# Patient Record
Sex: Female | Born: 1965 | Race: Black or African American | Hispanic: No | Marital: Single | Smoking: Never smoker
Health system: Southern US, Community
[De-identification: ages and names within clinical notes are randomized; demographics above are authoritative.]

## PROBLEM LIST (undated history)

## (undated) DIAGNOSIS — I10 Essential (primary) hypertension: Secondary | ICD-10-CM

## (undated) DIAGNOSIS — G43909 Migraine, unspecified, not intractable, without status migrainosus: Secondary | ICD-10-CM

## (undated) DIAGNOSIS — E119 Type 2 diabetes mellitus without complications: Secondary | ICD-10-CM

## (undated) DIAGNOSIS — E7800 Pure hypercholesterolemia, unspecified: Secondary | ICD-10-CM

## (undated) HISTORY — PX: WRIST SURGERY: SHX841

## (undated) HISTORY — PX: ABDOMINAL HYSTERECTOMY: SHX81

## (undated) HISTORY — PX: ANKLE SURGERY: SHX546

## (undated) HISTORY — PX: KNEE SURGERY: SHX244

---

## 2004-07-30 ENCOUNTER — Emergency Department: Payer: Self-pay | Admitting: Emergency Medicine

## 2004-09-18 ENCOUNTER — Emergency Department: Payer: Self-pay | Admitting: General Practice

## 2004-11-02 ENCOUNTER — Ambulatory Visit: Payer: Self-pay | Admitting: Unknown Physician Specialty

## 2004-11-17 ENCOUNTER — Other Ambulatory Visit: Payer: Self-pay

## 2004-11-17 ENCOUNTER — Emergency Department: Payer: Self-pay | Admitting: Unknown Physician Specialty

## 2004-12-14 ENCOUNTER — Ambulatory Visit: Payer: Self-pay | Admitting: Family Medicine

## 2005-01-08 ENCOUNTER — Emergency Department: Payer: Self-pay | Admitting: Emergency Medicine

## 2005-01-10 ENCOUNTER — Emergency Department: Payer: Self-pay | Admitting: Emergency Medicine

## 2005-03-10 ENCOUNTER — Emergency Department: Payer: Self-pay | Admitting: Emergency Medicine

## 2005-06-21 ENCOUNTER — Emergency Department: Payer: Self-pay | Admitting: Emergency Medicine

## 2005-09-18 ENCOUNTER — Emergency Department: Payer: Self-pay | Admitting: General Practice

## 2006-03-07 ENCOUNTER — Emergency Department: Payer: Self-pay | Admitting: Emergency Medicine

## 2006-09-22 ENCOUNTER — Emergency Department: Payer: Self-pay | Admitting: Emergency Medicine

## 2006-09-24 ENCOUNTER — Emergency Department: Payer: Self-pay | Admitting: Emergency Medicine

## 2006-09-25 ENCOUNTER — Emergency Department: Payer: Self-pay | Admitting: Emergency Medicine

## 2007-01-01 ENCOUNTER — Emergency Department: Payer: Self-pay | Admitting: Emergency Medicine

## 2007-01-14 ENCOUNTER — Ambulatory Visit: Payer: Self-pay | Admitting: Family Medicine

## 2007-02-01 ENCOUNTER — Emergency Department: Payer: Self-pay | Admitting: Emergency Medicine

## 2007-04-11 ENCOUNTER — Other Ambulatory Visit: Payer: Self-pay

## 2007-04-11 ENCOUNTER — Ambulatory Visit: Payer: Self-pay | Admitting: Unknown Physician Specialty

## 2007-04-18 ENCOUNTER — Ambulatory Visit: Payer: Self-pay | Admitting: Unknown Physician Specialty

## 2007-04-19 ENCOUNTER — Emergency Department: Payer: Self-pay | Admitting: Emergency Medicine

## 2007-04-19 ENCOUNTER — Other Ambulatory Visit: Payer: Self-pay

## 2007-04-22 ENCOUNTER — Emergency Department: Payer: Self-pay | Admitting: Emergency Medicine

## 2007-05-01 ENCOUNTER — Emergency Department: Payer: Self-pay | Admitting: Emergency Medicine

## 2007-05-19 ENCOUNTER — Emergency Department: Payer: Self-pay | Admitting: Emergency Medicine

## 2007-05-20 ENCOUNTER — Emergency Department: Payer: Self-pay | Admitting: Emergency Medicine

## 2007-05-23 ENCOUNTER — Emergency Department: Payer: Self-pay | Admitting: Emergency Medicine

## 2007-05-26 ENCOUNTER — Emergency Department: Payer: Self-pay | Admitting: Emergency Medicine

## 2007-06-14 ENCOUNTER — Emergency Department: Payer: Self-pay | Admitting: Emergency Medicine

## 2007-06-14 ENCOUNTER — Other Ambulatory Visit: Payer: Self-pay

## 2007-07-18 ENCOUNTER — Emergency Department: Payer: Self-pay | Admitting: Emergency Medicine

## 2007-07-30 ENCOUNTER — Emergency Department: Payer: Self-pay | Admitting: Emergency Medicine

## 2007-08-15 ENCOUNTER — Emergency Department: Payer: Self-pay | Admitting: Emergency Medicine

## 2007-12-02 ENCOUNTER — Emergency Department: Payer: Self-pay | Admitting: Emergency Medicine

## 2007-12-03 ENCOUNTER — Emergency Department (HOSPITAL_COMMUNITY): Admission: EM | Admit: 2007-12-03 | Discharge: 2007-12-03 | Payer: Self-pay | Admitting: Emergency Medicine

## 2008-01-05 ENCOUNTER — Emergency Department (HOSPITAL_COMMUNITY): Admission: EM | Admit: 2008-01-05 | Discharge: 2008-01-05 | Payer: Self-pay | Admitting: Emergency Medicine

## 2008-01-06 ENCOUNTER — Emergency Department: Payer: Self-pay | Admitting: Emergency Medicine

## 2008-01-06 ENCOUNTER — Emergency Department (HOSPITAL_COMMUNITY): Admission: EM | Admit: 2008-01-06 | Discharge: 2008-01-06 | Payer: Self-pay | Admitting: Emergency Medicine

## 2008-01-20 ENCOUNTER — Ambulatory Visit: Payer: Self-pay | Admitting: Family Medicine

## 2008-01-21 ENCOUNTER — Emergency Department: Payer: Self-pay | Admitting: Emergency Medicine

## 2008-03-02 ENCOUNTER — Emergency Department: Payer: Self-pay | Admitting: Emergency Medicine

## 2008-03-04 ENCOUNTER — Emergency Department: Payer: Self-pay | Admitting: Emergency Medicine

## 2008-04-13 ENCOUNTER — Ambulatory Visit: Payer: Self-pay | Admitting: Neurology

## 2008-05-07 ENCOUNTER — Ambulatory Visit: Payer: Self-pay | Admitting: Neurology

## 2008-05-08 ENCOUNTER — Emergency Department: Payer: Self-pay | Admitting: Internal Medicine

## 2008-05-26 ENCOUNTER — Emergency Department: Payer: Self-pay | Admitting: Emergency Medicine

## 2008-06-23 ENCOUNTER — Emergency Department: Payer: Self-pay | Admitting: Emergency Medicine

## 2008-07-30 ENCOUNTER — Ambulatory Visit: Payer: Self-pay | Admitting: Neurology

## 2008-12-27 ENCOUNTER — Emergency Department: Payer: Self-pay | Admitting: Unknown Physician Specialty

## 2009-01-09 ENCOUNTER — Emergency Department: Payer: Self-pay | Admitting: Emergency Medicine

## 2009-01-10 ENCOUNTER — Emergency Department: Payer: Self-pay | Admitting: Emergency Medicine

## 2009-01-20 ENCOUNTER — Ambulatory Visit: Payer: Self-pay | Admitting: Family Medicine

## 2009-03-10 ENCOUNTER — Emergency Department: Payer: Self-pay | Admitting: Emergency Medicine

## 2009-03-24 ENCOUNTER — Ambulatory Visit: Payer: Self-pay | Admitting: Obstetrics and Gynecology

## 2009-04-04 ENCOUNTER — Inpatient Hospital Stay: Payer: Self-pay | Admitting: Obstetrics and Gynecology

## 2009-04-10 ENCOUNTER — Emergency Department: Payer: Self-pay | Admitting: Emergency Medicine

## 2009-04-14 ENCOUNTER — Emergency Department: Payer: Self-pay | Admitting: Unknown Physician Specialty

## 2009-07-01 ENCOUNTER — Emergency Department: Payer: Self-pay | Admitting: Emergency Medicine

## 2009-08-18 ENCOUNTER — Emergency Department: Payer: Self-pay | Admitting: Unknown Physician Specialty

## 2010-01-05 ENCOUNTER — Emergency Department: Payer: Self-pay | Admitting: Unknown Physician Specialty

## 2010-01-24 ENCOUNTER — Ambulatory Visit: Payer: Self-pay | Admitting: Family Medicine

## 2010-03-09 ENCOUNTER — Emergency Department: Payer: Self-pay | Admitting: Emergency Medicine

## 2010-03-17 ENCOUNTER — Emergency Department: Payer: Self-pay | Admitting: Emergency Medicine

## 2010-05-21 ENCOUNTER — Emergency Department: Payer: Self-pay | Admitting: Emergency Medicine

## 2010-06-18 ENCOUNTER — Emergency Department: Payer: Self-pay | Admitting: Emergency Medicine

## 2010-06-23 ENCOUNTER — Ambulatory Visit: Payer: Self-pay | Admitting: Unknown Physician Specialty

## 2010-12-04 LAB — GLUCOSE, CAPILLARY
Glucose-Capillary: 110 — ABNORMAL HIGH
Glucose-Capillary: 74

## 2010-12-05 LAB — POCT I-STAT, CHEM 8
BUN: 9
HCT: 39
Sodium: 139
TCO2: 26

## 2010-12-05 LAB — POCT PREGNANCY, URINE: Preg Test, Ur: NEGATIVE

## 2010-12-05 LAB — GLUCOSE, CAPILLARY: Glucose-Capillary: 232 — ABNORMAL HIGH

## 2011-01-30 ENCOUNTER — Ambulatory Visit: Payer: Self-pay

## 2011-05-08 ENCOUNTER — Emergency Department: Payer: Self-pay | Admitting: Emergency Medicine

## 2011-08-15 ENCOUNTER — Emergency Department: Payer: Self-pay | Admitting: Unknown Physician Specialty

## 2011-08-16 LAB — URINALYSIS, COMPLETE
Bilirubin,UR: NEGATIVE
Ketone: NEGATIVE
Nitrite: NEGATIVE
Ph: 5 (ref 4.5–8.0)
RBC,UR: <1 /HPF (ref 0–5)
Specific Gravity: 1.017 (ref 1.003–1.030)

## 2011-08-17 ENCOUNTER — Emergency Department: Payer: Self-pay | Admitting: Emergency Medicine

## 2011-08-17 LAB — COMPREHENSIVE METABOLIC PANEL
Anion Gap: 9 (ref 7–16)
BUN: 16 mg/dL (ref 7–18)
Creatinine: 0.93 mg/dL (ref 0.60–1.30)
EGFR (African American): >60
Glucose: 164 mg/dL — ABNORMAL HIGH (ref 65–99)
Osmolality: 292 (ref 275–301)
Potassium: 3.6 mmol/L (ref 3.5–5.1)
SGOT(AST): 10 U/L — ABNORMAL LOW (ref 15–37)
SGPT (ALT): 19 U/L
Sodium: 144 mmol/L (ref 136–145)
Total Protein: 6.3 g/dL — ABNORMAL LOW (ref 6.4–8.2)

## 2011-08-17 LAB — URINALYSIS, COMPLETE
Bacteria: NONE SEEN
Blood: NEGATIVE
Ph: 7 (ref 4.5–8.0)
Protein: NEGATIVE
Squamous Epithelial: 54
WBC UR: NONE SEEN /HPF (ref 0–5)

## 2011-08-17 LAB — CBC
HCT: 41.2 % (ref 35.0–47.0)
HGB: 13 g/dL (ref 12.0–16.0)

## 2011-09-03 ENCOUNTER — Ambulatory Visit: Payer: Self-pay | Admitting: Unknown Physician Specialty

## 2011-11-13 ENCOUNTER — Emergency Department: Payer: Self-pay | Admitting: Emergency Medicine

## 2011-11-13 LAB — URINALYSIS, COMPLETE
Glucose,UR: 150 mg/dL (ref 0–75)
Leukocyte Esterase: NEGATIVE
Nitrite: NEGATIVE
RBC,UR: 2 /HPF (ref 0–5)
Specific Gravity: 1.026 (ref 1.003–1.030)
WBC UR: 2 /HPF (ref 0–5)

## 2011-11-13 LAB — COMPREHENSIVE METABOLIC PANEL
Alkaline Phosphatase: 88 U/L (ref 50–136)
Bilirubin,Total: 0.3 mg/dL (ref 0.2–1.0)
Chloride: 110 mmol/L — ABNORMAL HIGH (ref 98–107)
EGFR (African American): >60
EGFR (Non-African Amer.): >60
Glucose: 201 mg/dL — ABNORMAL HIGH (ref 65–99)
Osmolality: 289 (ref 275–301)
Potassium: 3.9 mmol/L (ref 3.5–5.1)
SGPT (ALT): 24 U/L (ref 12–78)
Sodium: 142 mmol/L (ref 136–145)

## 2011-11-13 LAB — PRO B NATRIURETIC PEPTIDE: B-Type Natriuretic Peptide: 178 pg/mL — ABNORMAL HIGH (ref 0–125)

## 2011-11-13 LAB — CBC
HCT: 40 % (ref 35.0–47.0)
HGB: 13.3 g/dL (ref 12.0–16.0)
MCV: 81 fL (ref 80–100)
RBC: 4.94 10*6/uL (ref 3.80–5.20)
RDW: 14.2 % (ref 11.5–14.5)
WBC: 7.1 10*3/uL (ref 3.6–11.0)

## 2011-12-18 ENCOUNTER — Emergency Department: Payer: Self-pay | Admitting: Unknown Physician Specialty

## 2012-02-07 ENCOUNTER — Ambulatory Visit: Payer: Self-pay

## 2012-05-10 ENCOUNTER — Emergency Department: Payer: Self-pay | Admitting: Emergency Medicine

## 2012-06-16 ENCOUNTER — Emergency Department: Payer: Self-pay | Admitting: Emergency Medicine

## 2012-06-16 LAB — CBC
HCT: 41.2 % (ref 35.0–47.0)
HGB: 13.5 g/dL (ref 12.0–16.0)
MCHC: 32.7 g/dL (ref 32.0–36.0)
MCV: 81 fL (ref 80–100)
Platelet: 237 10*3/uL (ref 150–440)
RBC: 5.1 10*6/uL (ref 3.80–5.20)
RDW: 13.8 % (ref 11.5–14.5)
WBC: 11.1 10*3/uL — ABNORMAL HIGH (ref 3.6–11.0)

## 2012-06-16 LAB — COMPREHENSIVE METABOLIC PANEL
Albumin: 3.6 g/dL (ref 3.4–5.0)
Alkaline Phosphatase: 97 U/L (ref 50–136)
BUN: 14 mg/dL (ref 7–18)
Bilirubin,Total: 0.6 mg/dL (ref 0.2–1.0)
Calcium, Total: 8.6 mg/dL (ref 8.5–10.1)
Co2: 24 mmol/L (ref 21–32)
Creatinine: 0.88 mg/dL (ref 0.60–1.30)
Glucose: 180 mg/dL — ABNORMAL HIGH (ref 65–99)
Osmolality: 281 (ref 275–301)
SGOT(AST): 17 U/L (ref 15–37)
Sodium: 138 mmol/L (ref 136–145)

## 2012-06-16 LAB — CK TOTAL AND CKMB (NOT AT ARMC)
CK, Total: 107 U/L (ref 21–215)
CK-MB: <0.5 ng/mL — ABNORMAL LOW (ref 0.5–3.6)

## 2012-09-21 ENCOUNTER — Emergency Department: Payer: Self-pay | Admitting: Emergency Medicine

## 2012-10-20 ENCOUNTER — Emergency Department: Payer: Self-pay | Admitting: Emergency Medicine

## 2012-10-20 LAB — URINALYSIS, COMPLETE
Bilirubin,UR: NEGATIVE
Glucose,UR: NEGATIVE mg/dL (ref 0–75)
Leukocyte Esterase: NEGATIVE
Nitrite: NEGATIVE
RBC,UR: 1 /HPF (ref 0–5)
Squamous Epithelial: 12
WBC UR: 2 /HPF (ref 0–5)

## 2012-10-20 LAB — CBC
HGB: 13.8 g/dL (ref 12.0–16.0)
RDW: 14 % (ref 11.5–14.5)
WBC: 8.5 10*3/uL (ref 3.6–11.0)

## 2012-10-20 LAB — BASIC METABOLIC PANEL
Calcium, Total: 8.5 mg/dL (ref 8.5–10.1)
Co2: 25 mmol/L (ref 21–32)
Creatinine: 0.99 mg/dL (ref 0.60–1.30)
Glucose: 128 mg/dL — ABNORMAL HIGH (ref 65–99)
Potassium: 3.8 mmol/L (ref 3.5–5.1)
Sodium: 138 mmol/L (ref 136–145)

## 2012-12-03 ENCOUNTER — Emergency Department: Payer: Self-pay | Admitting: Emergency Medicine

## 2013-02-10 ENCOUNTER — Ambulatory Visit: Payer: Self-pay | Admitting: *Deleted

## 2013-03-10 ENCOUNTER — Ambulatory Visit: Payer: Self-pay

## 2013-03-24 ENCOUNTER — Emergency Department: Payer: Self-pay | Admitting: Emergency Medicine

## 2013-03-26 ENCOUNTER — Emergency Department: Payer: Self-pay | Admitting: Emergency Medicine

## 2013-09-07 ENCOUNTER — Emergency Department: Payer: Self-pay | Admitting: Emergency Medicine

## 2013-09-07 LAB — COMPREHENSIVE METABOLIC PANEL
ALT: 23 U/L (ref 12–78)
Albumin: 3.1 g/dL — ABNORMAL LOW (ref 3.4–5.0)
Alkaline Phosphatase: 78 U/L
Anion Gap: 5 — ABNORMAL LOW (ref 7–16)
BILIRUBIN TOTAL: 0.3 mg/dL (ref 0.2–1.0)
BUN: 13 mg/dL (ref 7–18)
CHLORIDE: 109 mmol/L — AB (ref 98–107)
CO2: 26 mmol/L (ref 21–32)
CREATININE: 1.15 mg/dL (ref 0.60–1.30)
Calcium, Total: 8.1 mg/dL — ABNORMAL LOW (ref 8.5–10.1)
EGFR (African American): >60
EGFR (Non-African Amer.): 57 — ABNORMAL LOW
Glucose: 154 mg/dL — ABNORMAL HIGH (ref 65–99)
Osmolality: 283 (ref 275–301)
POTASSIUM: 3.8 mmol/L (ref 3.5–5.1)
SGOT(AST): 17 U/L (ref 15–37)
Sodium: 140 mmol/L (ref 136–145)
TOTAL PROTEIN: 6.6 g/dL (ref 6.4–8.2)

## 2013-09-07 LAB — CBC
HCT: 40.1 % (ref 35.0–47.0)
HGB: 13.3 g/dL (ref 12.0–16.0)
MCH: 27.3 pg (ref 26.0–34.0)
MCHC: 33.1 g/dL (ref 32.0–36.0)
MCV: 82 fL (ref 80–100)
Platelet: 257 10*3/uL (ref 150–440)
RBC: 4.87 10*6/uL (ref 3.80–5.20)
RDW: 14 % (ref 11.5–14.5)
WBC: 8.5 10*3/uL (ref 3.6–11.0)

## 2013-09-07 LAB — TROPONIN I: Troponin-I: <0.02 ng/mL

## 2013-09-07 LAB — CK TOTAL AND CKMB (NOT AT ARMC)
CK, Total: 78 U/L
CK-MB: 0.5 ng/mL (ref 0.5–3.6)

## 2013-09-07 LAB — LIPASE, BLOOD: Lipase: 160 U/L (ref 73–393)

## 2013-09-10 ENCOUNTER — Emergency Department: Payer: Self-pay | Admitting: Emergency Medicine

## 2013-11-13 ENCOUNTER — Emergency Department: Payer: Self-pay | Admitting: Emergency Medicine

## 2013-11-13 LAB — BASIC METABOLIC PANEL
ANION GAP: 5 — AB (ref 7–16)
BUN: 19 mg/dL — AB (ref 7–18)
CHLORIDE: 107 mmol/L (ref 98–107)
CREATININE: 1.12 mg/dL (ref 0.60–1.30)
Calcium, Total: 8.6 mg/dL (ref 8.5–10.1)
Co2: 29 mmol/L (ref 21–32)
GFR CALC NON AF AMER: 59 — AB
GLUCOSE: 145 mg/dL — AB (ref 65–99)
OSMOLALITY: 286 (ref 275–301)
POTASSIUM: 3.9 mmol/L (ref 3.5–5.1)
Sodium: 141 mmol/L (ref 136–145)

## 2013-11-13 LAB — CBC
HCT: 44.1 % (ref 35.0–47.0)
HGB: 13.9 g/dL (ref 12.0–16.0)
MCH: 26 pg (ref 26.0–34.0)
MCHC: 31.5 g/dL — AB (ref 32.0–36.0)
MCV: 83 fL (ref 80–100)
PLATELETS: 263 10*3/uL (ref 150–440)
RBC: 5.33 10*6/uL — AB (ref 3.80–5.20)
RDW: 13.9 % (ref 11.5–14.5)
WBC: 9.1 10*3/uL (ref 3.6–11.0)

## 2013-11-13 LAB — URINALYSIS, COMPLETE
BILIRUBIN, UR: NEGATIVE
Bacteria: NONE SEEN
Blood: NEGATIVE
GLUCOSE, UR: NEGATIVE mg/dL (ref 0–75)
KETONE: NEGATIVE
Leukocyte Esterase: NEGATIVE
Nitrite: NEGATIVE
Ph: 6 (ref 4.5–8.0)
Protein: NEGATIVE
RBC,UR: 1 /HPF (ref 0–5)
SPECIFIC GRAVITY: 1.025 (ref 1.003–1.030)
WBC UR: 2 /HPF (ref 0–5)

## 2013-11-25 ENCOUNTER — Emergency Department: Payer: Self-pay | Admitting: Emergency Medicine

## 2013-12-30 ENCOUNTER — Emergency Department: Payer: Self-pay | Admitting: Emergency Medicine

## 2013-12-30 LAB — CBC
HCT: 43.7 % (ref 35.0–47.0)
HGB: 14.2 g/dL (ref 12.0–16.0)
MCH: 27 pg (ref 26.0–34.0)
MCHC: 32.5 g/dL (ref 32.0–36.0)
MCV: 83 fL (ref 80–100)
PLATELETS: 286 10*3/uL (ref 150–440)
RBC: 5.26 10*6/uL — ABNORMAL HIGH (ref 3.80–5.20)
RDW: 13.3 % (ref 11.5–14.5)
WBC: 8.4 10*3/uL (ref 3.6–11.0)

## 2013-12-31 LAB — TROPONIN I: Troponin-I: <0.02 ng/mL

## 2013-12-31 LAB — BASIC METABOLIC PANEL
ANION GAP: 11 (ref 7–16)
BUN: 17 mg/dL (ref 7–18)
CREATININE: 1.53 mg/dL — AB (ref 0.60–1.30)
Calcium, Total: 8.6 mg/dL (ref 8.5–10.1)
Chloride: 105 mmol/L (ref 98–107)
Co2: 25 mmol/L (ref 21–32)
EGFR (Non-African Amer.): 39 — ABNORMAL LOW
GFR CALC AF AMER: 47 — AB
GLUCOSE: 95 mg/dL (ref 65–99)
Osmolality: 283 (ref 275–301)
Potassium: 3.3 mmol/L — ABNORMAL LOW (ref 3.5–5.1)
Sodium: 141 mmol/L (ref 136–145)

## 2014-02-11 ENCOUNTER — Ambulatory Visit: Payer: Self-pay | Admitting: Family

## 2014-02-23 ENCOUNTER — Emergency Department: Payer: Self-pay | Admitting: Emergency Medicine

## 2014-02-23 LAB — CBC
HCT: 42.6 % (ref 35.0–47.0)
HGB: 13.8 g/dL (ref 12.0–16.0)
MCH: 27 pg (ref 26.0–34.0)
MCHC: 32.4 g/dL (ref 32.0–36.0)
MCV: 83 fL (ref 80–100)
Platelet: 277 10*3/uL (ref 150–440)
RBC: 5.11 10*6/uL (ref 3.80–5.20)
RDW: 13.7 % (ref 11.5–14.5)
WBC: 7.7 10*3/uL (ref 3.6–11.0)

## 2014-02-23 LAB — COMPREHENSIVE METABOLIC PANEL
ALBUMIN: 3.3 g/dL — AB (ref 3.4–5.0)
ANION GAP: 6 — AB (ref 7–16)
AST: 20 U/L (ref 15–37)
Alkaline Phosphatase: 93 U/L
BUN: 14 mg/dL (ref 7–18)
Bilirubin,Total: 0.3 mg/dL (ref 0.2–1.0)
CHLORIDE: 109 mmol/L — AB (ref 98–107)
CO2: 25 mmol/L (ref 21–32)
Calcium, Total: 8.7 mg/dL (ref 8.5–10.1)
Creatinine: 1.34 mg/dL — ABNORMAL HIGH (ref 0.60–1.30)
EGFR (African American): 55 — ABNORMAL LOW
GFR CALC NON AF AMER: 45 — AB
Glucose: 118 mg/dL — ABNORMAL HIGH (ref 65–99)
OSMOLALITY: 281 (ref 275–301)
Potassium: 4.1 mmol/L (ref 3.5–5.1)
SGPT (ALT): 32 U/L
Sodium: 140 mmol/L (ref 136–145)
TOTAL PROTEIN: 6.9 g/dL (ref 6.4–8.2)

## 2014-03-14 ENCOUNTER — Emergency Department: Payer: Self-pay | Admitting: Student

## 2014-03-14 LAB — BASIC METABOLIC PANEL
Anion Gap: 9 (ref 7–16)
BUN: 13 mg/dL (ref 7–18)
CALCIUM: 8.5 mg/dL (ref 8.5–10.1)
CO2: 27 mmol/L (ref 21–32)
Chloride: 105 mmol/L (ref 98–107)
Creatinine: 1.15 mg/dL (ref 0.60–1.30)
EGFR (African American): >60
EGFR (Non-African Amer.): 54 — ABNORMAL LOW
Glucose: 134 mg/dL — ABNORMAL HIGH (ref 65–99)
Osmolality: 283 (ref 275–301)
POTASSIUM: 3.5 mmol/L (ref 3.5–5.1)
SODIUM: 141 mmol/L (ref 136–145)

## 2014-03-14 LAB — CBC
HCT: 42.2 % (ref 35.0–47.0)
HGB: 13.5 g/dL (ref 12.0–16.0)
MCH: 26.3 pg (ref 26.0–34.0)
MCHC: 32.1 g/dL (ref 32.0–36.0)
MCV: 82 fL (ref 80–100)
PLATELETS: 285 10*3/uL (ref 150–440)
RBC: 5.14 10*6/uL (ref 3.80–5.20)
RDW: 13.7 % (ref 11.5–14.5)
WBC: 8.6 10*3/uL (ref 3.6–11.0)

## 2014-03-14 LAB — TROPONIN I

## 2014-03-14 LAB — HCG, QUANTITATIVE, PREGNANCY

## 2014-03-19 ENCOUNTER — Emergency Department: Payer: Self-pay | Admitting: Emergency Medicine

## 2014-04-07 ENCOUNTER — Emergency Department: Payer: Self-pay | Admitting: Emergency Medicine

## 2014-04-07 LAB — COMPREHENSIVE METABOLIC PANEL
ALBUMIN: 3.3 g/dL — AB (ref 3.4–5.0)
ALK PHOS: 74 U/L (ref 46–116)
ALT: 23 U/L (ref 14–63)
AST: 13 U/L — AB (ref 15–37)
Anion Gap: 8 (ref 7–16)
BUN: 17 mg/dL (ref 7–18)
Bilirubin,Total: 0.4 mg/dL (ref 0.2–1.0)
CREATININE: 1.19 mg/dL (ref 0.60–1.30)
Calcium, Total: 8.8 mg/dL (ref 8.5–10.1)
Chloride: 106 mmol/L (ref 98–107)
Co2: 27 mmol/L (ref 21–32)
EGFR (African American): >60
EGFR (Non-African Amer.): 52 — ABNORMAL LOW
Glucose: 147 mg/dL — ABNORMAL HIGH (ref 65–99)
Osmolality: 285 (ref 275–301)
Potassium: 3.4 mmol/L — ABNORMAL LOW (ref 3.5–5.1)
Sodium: 141 mmol/L (ref 136–145)
TOTAL PROTEIN: 6.5 g/dL (ref 6.4–8.2)

## 2014-04-07 LAB — CBC
HCT: 40.9 % (ref 35.0–47.0)
HGB: 13.3 g/dL (ref 12.0–16.0)
MCH: 26.4 pg (ref 26.0–34.0)
MCHC: 32.5 g/dL (ref 32.0–36.0)
MCV: 81 fL (ref 80–100)
PLATELETS: 245 10*3/uL (ref 150–440)
RBC: 5.03 10*6/uL (ref 3.80–5.20)
RDW: 13.4 % (ref 11.5–14.5)
WBC: 6.9 10*3/uL (ref 3.6–11.0)

## 2014-04-07 LAB — TROPONIN I: Troponin-I: <0.02 ng/mL

## 2014-04-28 DIAGNOSIS — G43909 Migraine, unspecified, not intractable, without status migrainosus: Secondary | ICD-10-CM | POA: Insufficient documentation

## 2014-04-28 DIAGNOSIS — E119 Type 2 diabetes mellitus without complications: Secondary | ICD-10-CM | POA: Insufficient documentation

## 2014-06-27 NOTE — Op Note (Signed)
PATIENT NAME:  Michele [name], Michele [name] MR#:  [id number] DATE OF BIRTH:  11-May-1965  DATE OF PROCEDURE:  09/03/2011  PREOPERATIVE DIAGNOSIS: Remote ORIF lateral malleolar fracture right ankle.   POSTOPERATIVE DIAGNOSIS: Remote ORIF lateral malleolar fracture right ankle.   PROCEDURE: Removal of lateral malleolar hardware.   SURGEON: Alda BertholdHarold B. Deshun Sedivy Jr., MD  ANESTHESIA: General.   HISTORY: The patient had [name] remote displaced fracture of right lateral malleolus along with disruption of her distal tib-fib syndesmosis. She had ORIF of the lateral malleolar fracture and fixation of the ruptured syndesmosis. Patient had healed her fracture and the joint mortise had been essentially anatomic. Her current problem was pain secondary to her prominent fixation screws. She was brought in for removal of her fixation hardware.   DESCRIPTION OF PROCEDURE: Patient taken the Operating Room where satisfactory general anesthesia was achieved. [name] tourniquet was applied to her right upper thigh and then the right lower extremity was prepped and draped in the usual fashion for [name] procedure about the ankle. Next, the right upper extremity was exsanguinated and tourniquet was inflated. The patient's previous lateral malleolar incision was re-incised. I then bluntly and sharply dissected down to the lateral fixation plate. I used [name] knife to remove the scar tissue covering the plate and then I removed the seven cortical screws that were used to stabilize the plate. I also removed the fixation button that was on the lateral aspect of the plate. This was attached to the tib-fib transfixion sutures.   After the plate and screws and transfixion button were removed the tourniquet was released. It was up 15 minutes. There was very minimal bleeding. The wound was irrigated with GU irrigant and then I closed the sub-Q with 2-0 Vicryl and the skin with skin staples. I infiltrated the wound with about 15 mL of 0.5% Marcaine with epinephrine.  Betadine was applied to the wound followed by [name] sterile dressing and an [employer] boot.     Patient was then awakened and transferred to [name] stretcher bed. She was taken to the recovery room in satisfactory condition. Blood loss was negligible.    ____________________________ Alda BertholdHarold B. Ryle Buscemi Jr., MD hbk:cms D: 09/03/2011 14:16:28 ET T: 09/03/2011 14:56:49 ET JOB#: [id number]  cc: Alda BertholdHarold B. Dhara Schepp Jr., MD, <Dictator>  Randon GoldsmithHAROLD B Brynley Cuddeback, Montez HagemanJR MD ELECTRONICALLY SIGNED 09/17/2011 11:41

## 2014-08-19 ENCOUNTER — Encounter: Payer: Self-pay | Admitting: Emergency Medicine

## 2014-08-19 ENCOUNTER — Emergency Department: Payer: No Typology Code available for payment source

## 2014-08-19 ENCOUNTER — Emergency Department
Admission: EM | Admit: 2014-08-19 | Discharge: 2014-08-19 | Disposition: A | Discharge status: Home / Self Care | Payer: No Typology Code available for payment source | Attending: Emergency Medicine | Admitting: Emergency Medicine

## 2014-08-19 DIAGNOSIS — H6502 Acute serous otitis media, left ear: Secondary | ICD-10-CM | POA: Diagnosis not present

## 2014-08-19 DIAGNOSIS — E119 Type 2 diabetes mellitus without complications: Secondary | ICD-10-CM | POA: Insufficient documentation

## 2014-08-19 DIAGNOSIS — J069 Acute upper respiratory infection, unspecified: Secondary | ICD-10-CM | POA: Diagnosis not present

## 2014-08-19 DIAGNOSIS — I10 Essential (primary) hypertension: Secondary | ICD-10-CM | POA: Insufficient documentation

## 2014-08-19 DIAGNOSIS — H938X2 Other specified disorders of left ear: Secondary | ICD-10-CM | POA: Diagnosis not present

## 2014-08-19 DIAGNOSIS — M62830 Muscle spasm of back: Secondary | ICD-10-CM | POA: Insufficient documentation

## 2014-08-19 DIAGNOSIS — M62838 Other muscle spasm: Secondary | ICD-10-CM | POA: Diagnosis not present

## 2014-08-19 DIAGNOSIS — M545 Low back pain: Secondary | ICD-10-CM | POA: Diagnosis present

## 2014-08-19 HISTORY — DX: Type 2 diabetes mellitus without complications: E11.9

## 2014-08-19 HISTORY — DX: Essential (primary) hypertension: I10

## 2014-08-19 HISTORY — DX: Migraine, unspecified, not intractable, without status migrainosus: G43.909

## 2014-08-19 MED ORDER — KETOROLAC TROMETHAMINE 30 MG/ML IJ SOLN
30.0000 mg | Freq: Once | INTRAMUSCULAR | Status: AC
  Administered 2014-08-19: 30 mg via INTRAMUSCULAR

## 2014-08-19 MED ORDER — BENZONATATE 100 MG PO CAPS: 100.0000 mg | ORAL_CAPSULE | Freq: Four times a day (QID) | ORAL | Status: AC | PRN

## 2014-08-19 MED ORDER — KETOROLAC TROMETHAMINE 30 MG/ML IJ SOLN
INTRAMUSCULAR | Status: AC
  Filled 2014-08-19: qty 1

## 2014-08-19 MED ORDER — CYCLOBENZAPRINE HCL 10 MG PO TABS
10.0000 mg | ORAL_TABLET | Freq: Three times a day (TID) | ORAL | Status: DC | PRN
Start: 2014-08-19 — End: 2015-06-14

## 2014-08-19 NOTE — ED Provider Notes (Signed)
CSN: [id number]     Arrival date & time 08/19/14  1447 History   First MD Initiated Contact with Patient 08/19/14 1528     Chief Complaint  Patient presents with  . Back Pain     HPI Comments: 49 year old female presents today complaining of back pain that started when she woke up this morning. She has had some sinus congestion and cough. She is mainly concerned about the pain in her upper back and lower back. The pain radiates from her back into her arms and legs. She reports she has a burning sensation in her arms and legs. She also has a burning sensation when her skin is touched. She has not had an injury to her back. She does not stand all day at work. She does not have a history of back problems per her report. No loss of bowel or bladder function, perianal or genital numbness.  Pt seen at walk in clinic yesterday and diagnosed with left otitis media, is taking amoxicillin for the same. Has also had some sinus congestion and cough, non productive.   Patient is a 49 y.o. female presenting with back pain. The history is provided by the patient.  Back Pain Location:  Thoracic spine and lumbar spine Quality:  Stabbing Radiates to:  L posterior upper leg and R posterior upper leg Pain severity:  Moderate Pain is:  Same all the time Onset quality:  Gradual Duration:  12 hours Timing:  Constant Progression:  Unchanged Chronicity:  New Context: recent illness   Context: not falling, not jumping from heights, not lifting heavy objects, not occupational injury, not physical stress and not recent injury   Relieved by:  None tried Worsened by:  Ambulation, deep breathing, movement, standing, twisting and lying down Ineffective treatments:  None tried Associated symptoms: fever, leg pain and tingling   Associated symptoms: no bladder incontinence, no bowel incontinence, no dysuria, no numbness, no paresthesias, no perianal numbness and no weakness   Risk factors: obesity     Past Medical  History  Diagnosis Date  . Hypertension   . Diabetes mellitus without complication   . Migraines    Past Surgical History  Procedure Laterality Date  . Cesarean section    . Knee surgery    . Ankle surgery     No family history on file. History  Substance Use Topics  . Smoking status: Never Smoker   . Smokeless tobacco: Current User    Types: Snuff  . Alcohol Use: Yes     Comment: occasionally   OB History    No data available     Review of Systems  Constitutional: Positive for fever. Negative for chills.  HENT: Positive for congestion.   Respiratory: Positive for cough.   Gastrointestinal: Negative for bowel incontinence.  Genitourinary: Negative for bladder incontinence and dysuria.  Musculoskeletal: Positive for myalgias, back pain and arthralgias. Negative for neck pain and neck stiffness.  Skin: Negative for rash.  Neurological: Positive for tingling. Negative for weakness, numbness and paresthesias.  All other systems reviewed and are negative.     Allergies  Review of patient's allergies indicates no known allergies.  Home Medications   Prior to Admission medications   Medication Sig Start Date End Date Taking? Authorizing Provider  benzonatate (TESSALON PERLES) 100 MG capsule Take 1 capsule (100 mg total) by mouth every 6 (six) hours as needed for cough. 08/19/14 08/19/15  Luvenia Redden, PA-C  cyclobenzaprine (FLEXERIL) 10 MG tablet Take 1 tablet (  10 mg total) by mouth every 8 (eight) hours as needed for muscle spasms. 08/19/14 08/19/15  Wilber Oliphant V, PA-C   BP 145/97 mmHg  Pulse 103  Temp(Src) 100.8 F (38.2 C) (Oral)  Resp 17  Ht  (1.676 m)  Wt 250 lb (113.399 kg)  BMI 40.37 kg/m2  SpO2 95% Physical Exam  Constitutional: She is oriented to person, place, and time. She appears well-developed and well-nourished. She is active.  Non-toxic appearance. She does not have a sickly appearance. She does not appear ill.  Febrile. Morbidly obese. In mild  distress due to pain  HENT:  Head: Normocephalic and atraumatic.  Right Ear: Tympanic membrane and external ear normal.  Left Ear: External ear normal. Tympanic membrane is erythematous and retracted.  Nose: Mucosal edema and rhinorrhea present.  Mouth/Throat: Uvula is midline, oropharynx is clear and moist and mucous membranes are normal.  Eyes: Conjunctivae and EOM are normal. Pupils are equal, round, and reactive to light.  Neck: Trachea normal, normal range of motion, full passive range of motion without pain and phonation normal. Neck supple. No Brudzinski's sign and no Kernig's sign noted.  Cardiovascular: Normal rate, regular rhythm, normal heart sounds and intact distal pulses.   Pulses:      Right dorsalis pedis pulse not accessible and left dorsalis pedis pulse not accessible.       Right posterior tibial pulse not accessible and left posterior tibial pulse not accessible.  Pulmonary/Chest: Effort normal and breath sounds normal. No respiratory distress. She has no wheezes. She has no rales.  Musculoskeletal: Normal range of motion. She exhibits tenderness.       Right hip: Normal. She exhibits no tenderness.       Left hip: Normal. She exhibits no tenderness.       Thoracic back: She exhibits tenderness, bony tenderness and spasm.       Lumbar back: She exhibits tenderness, bony tenderness and spasm.  Diffuse tenderness over thoracic spine Diffuse tenderness over lumbar spine  Lymphadenopathy:    She has no cervical adenopathy.  Neurological: She is alert and oriented to person, place, and time. She has normal strength. No sensory deficit. She exhibits normal muscle tone. Coordination and gait normal.  Equal grip strength bilateral upper extremities 5/5 Equal LE strength bilaterally 5/5  Skin: Skin is warm and dry. No rash noted.  Psychiatric: She has a normal mood and affect. Her behavior is normal. Judgment and thought content normal.  Nursing note and vitals  reviewed.   ED Course  Procedures (including critical care time) Labs Review Labs Reviewed - No data to display  Imaging Review Dg Thoracic Spine 2 View  08/19/2014   CLINICAL DATA:  One day history of severe dorsalgia  EXAM: THORACIC SPINE - 3 VIEW  COMPARISON:  None.  FINDINGS: Frontal, lateral, and swimmer's views obtained. No fracture or spondylolisthesis. Disc spaces appear intact. No erosive change.  IMPRESSION: No fracture or spondylolisthesis.  No appreciable arthropathy.   Electronically Signed   By: Bretta Bang III M.D.   On: 08/19/2014 16:27   Dg Lumbar Spine 2-3 Views  08/19/2014   CLINICAL DATA:  Severe back pain from shoulders to legs, no injury  EXAM: LUMBAR SPINE - 2-3 VIEW  COMPARISON:  03/24/2013  FINDINGS: Five non-rib-bearing lumbar vertebra.  Osseous mineralization grossly normal for technique.  Vertebral body and disc space heights maintained.  No acute fracture, subluxation or bone destruction.  No spondylolysis.  SI joints symmetric.  IMPRESSION:  Normal exam.   Electronically Signed   By: Ulyses Southward M.D.   On: 08/19/2014 16:27     EKG Interpretation None      I reviewed thoracic and lumbar spine xrays.   MDM  Thoracic and lumbar spine x-ray Fever likely due to viral URI versus) OM. Toradol 30 g IM given in ER Tessalon Perles for cough Alternate Tylenol and Motrin for fever Flexeril as needed for muscle spasms. Final diagnoses:  Muscle spasm of back  Muscle spasms of neck  Viral URI  Acute serous otitis media of left ear, recurrence not specified        Luvenia Redden, PA-C 08/19/14 1643  Darien Ramus, MD 08/19/14 2221

## 2014-08-19 NOTE — ED Notes (Signed)
Pt woke up with back pain today.  States she awakened with pain from shoulders to lower back.   No known injury.   Denies dysuria.   No n/v/d.   Pt has sinus congestion and cough   No fever.

## 2014-08-19 NOTE — ED Notes (Signed)
Pt states she woke up this morning with severe back pain from her shoulders all the way down her legs in the back only. Pt appears in no distress.

## 2014-08-26 ENCOUNTER — Other Ambulatory Visit: Payer: Self-pay

## 2014-08-26 ENCOUNTER — Emergency Department: Payer: PRIVATE HEALTH INSURANCE

## 2014-08-26 ENCOUNTER — Emergency Department
Admission: EM | Admit: 2014-08-26 | Discharge: 2014-08-26 | Disposition: A | Discharge status: Home / Self Care | Payer: PRIVATE HEALTH INSURANCE | Attending: Emergency Medicine | Admitting: Emergency Medicine

## 2014-08-26 ENCOUNTER — Encounter: Payer: Self-pay | Admitting: *Deleted

## 2014-08-26 DIAGNOSIS — Z79899 Other long term (current) drug therapy: Secondary | ICD-10-CM | POA: Diagnosis not present

## 2014-08-26 DIAGNOSIS — J3489 Other specified disorders of nose and nasal sinuses: Secondary | ICD-10-CM

## 2014-08-26 DIAGNOSIS — Z792 Long term (current) use of antibiotics: Secondary | ICD-10-CM | POA: Diagnosis not present

## 2014-08-26 DIAGNOSIS — I10 Essential (primary) hypertension: Secondary | ICD-10-CM | POA: Diagnosis not present

## 2014-08-26 DIAGNOSIS — R091 Pleurisy: Secondary | ICD-10-CM | POA: Insufficient documentation

## 2014-08-26 DIAGNOSIS — R079 Chest pain, unspecified: Secondary | ICD-10-CM | POA: Insufficient documentation

## 2014-08-26 DIAGNOSIS — E119 Type 2 diabetes mellitus without complications: Secondary | ICD-10-CM | POA: Insufficient documentation

## 2014-08-26 LAB — COMPREHENSIVE METABOLIC PANEL
ALK PHOS: 63 U/L (ref 38–126)
ALT: 22 U/L (ref 14–54)
AST: 20 U/L (ref 15–41)
Albumin: 3.6 g/dL (ref 3.5–5.0)
Anion gap: 7 (ref 5–15)
BILIRUBIN TOTAL: 0.2 mg/dL — AB (ref 0.3–1.2)
BUN: 16 mg/dL (ref 6–20)
CO2: 26 mmol/L (ref 22–32)
Calcium: 9.6 mg/dL (ref 8.9–10.3)
Chloride: 107 mmol/L (ref 101–111)
Creatinine, Ser: 1.2 mg/dL — ABNORMAL HIGH (ref 0.44–1.00)
GFR, EST NON AFRICAN AMERICAN: 53 mL/min — AB (ref >60)
GLUCOSE: 165 mg/dL — AB (ref 65–99)
POTASSIUM: 3.7 mmol/L (ref 3.5–5.1)
Sodium: 140 mmol/L (ref 135–145)
Total Protein: 6.9 g/dL (ref 6.5–8.1)

## 2014-08-26 LAB — CBC
HCT: 41.5 % (ref 35.0–47.0)
Hemoglobin: 13.8 g/dL (ref 12.0–16.0)
MCH: 26.7 pg (ref 26.0–34.0)
MCHC: 33.2 g/dL (ref 32.0–36.0)
MCV: 80.5 fL (ref 80.0–100.0)
PLATELETS: 273 10*3/uL (ref 150–440)
RBC: 5.15 MIL/uL (ref 3.80–5.20)
RDW: 13.6 % (ref 11.5–14.5)
WBC: 9 10*3/uL (ref 3.6–11.0)

## 2014-08-26 LAB — TROPONIN I: Troponin I: <0.03 ng/mL (ref <0.031)

## 2014-08-26 LAB — BRAIN NATRIURETIC PEPTIDE: B NATRIURETIC PEPTIDE 5: 8 pg/mL (ref 0.0–100.0)

## 2014-08-26 MED ORDER — ALBUTEROL SULFATE (2.5 MG/3ML) 0.083% IN NEBU
INHALATION_SOLUTION | RESPIRATORY_TRACT | Status: AC
  Administered 2014-08-26: 2.5 mg via RESPIRATORY_TRACT
  Filled 2014-08-26: qty 3

## 2014-08-26 MED ORDER — ALBUTEROL SULFATE (2.5 MG/3ML) 0.083% IN NEBU
2.5000 mg | INHALATION_SOLUTION | Freq: Once | RESPIRATORY_TRACT | Status: AC
  Administered 2014-08-26: 2.5 mg via RESPIRATORY_TRACT

## 2014-08-26 MED ORDER — IPRATROPIUM-ALBUTEROL 0.5-2.5 (3) MG/3ML IN SOLN
RESPIRATORY_TRACT | Status: AC
  Filled 2014-08-26: qty 3

## 2014-08-26 MED ORDER — ALBUTEROL SULFATE HFA 108 (90 BASE) MCG/ACT IN AERS: 2.0000 | INHALATION_SPRAY | Freq: Four times a day (QID) | RESPIRATORY_TRACT | Status: DC | PRN

## 2014-08-26 NOTE — ED Provider Notes (Signed)
[facility] Emergency Department Provider Note  ____________________________________________  Time seen: Approximately 830 PM  I have reviewed the triage vital signs and the nursing notes.   HISTORY  Chief Complaint Shortness of Breath    HPI Michele [name] is a 49 y.o. female with a history of hypertension and diabetes who presents today with 2 weeks of coughing as well as runny nose and ear infection. She is presenting today to the emergency department because she has had chest pain over the last 2 days. She says the chest pain is central and radiating to her back. She says that it is sharp and is worsened with coughing. She says that she has tried Toradol, muscle cream as well as muscle relaxers without relief. She is also had 2 courses of antibiotics without resolution of her symptoms. She has tried Mucinex and Flonase also without relief. Is complaining of pain to her left ear which she describes as pressure which is moderate. Patient says she has had nausea but no vomiting. No known sick contacts. Denies being on any hormone supplements such as birth control.   Past Medical History  Diagnosis Date  . Hypertension   . Diabetes mellitus without complication   . Migraines     There are no active problems to display for this patient.   Past Surgical History  Procedure Laterality Date  . Cesarean section    . Knee surgery    . Ankle surgery      Current Outpatient Rx  Name  Route  Sig  Dispense  Refill  . amitriptyline (ELAVIL) 50 MG tablet   Oral   Take 50 mg by mouth at bedtime.         Marland Kitchen amoxicillin (AMOXIL) 875 MG tablet   Oral   Take 1 tablet by mouth 2 (two) times daily.         . chlorpheniramine-HYDROcodone (TUSSIONEX) 10-8 MG/5ML SUER   Oral   Take 5 mLs by mouth every 12 (twelve) hours as needed for cough.         . loratadine (CLARITIN) 10 MG tablet   Oral   Take 10 mg by mouth daily.         . metFORMIN (GLUMETZA)  500 MG (MOD) 24 hr tablet   Oral   Take 500 mg by mouth 2 (two) times daily with a meal.         . SUMAtriptan-naproxen (TREXIMET) 85-500 MG per tablet   Oral   Take 1 tablet by mouth every 2 (two) hours as needed for migraine.         . topiramate (TOPAMAX) 100 MG tablet   Oral   Take 100 mg by mouth 2 (two) times daily.         Marland Kitchen triamterene-hydrochlorothiazide (MAXZIDE-25) 37.5-25 MG per tablet   Oral   Take 1 tablet by mouth daily.         . verapamil (COVERA HS) 180 MG (CO) 24 hr tablet   Oral   Take 180 mg by mouth at bedtime.         . benzonatate (TESSALON PERLES) 100 MG capsule   Oral   Take 1 capsule (100 mg total) by mouth every 6 (six) hours as needed for cough. Patient not taking: Reported on 08/26/2014   30 capsule   0   . cyclobenzaprine (FLEXERIL) 10 MG tablet   Oral   Take 1 tablet (10 mg total) by mouth every 8 (eight) hours  as needed for muscle spasms. Patient not taking: Reported on 08/26/2014   30 tablet   0     Allergies Review of patient's allergies indicates no known allergies.  No family history on file.  Social History History  Substance Use Topics  . Smoking status: Never Smoker   . Smokeless tobacco: Current User    Types: Snuff  . Alcohol Use: Yes     Comment: occasionally    Review of Systems Constitutional: Fever several days ago which is resolved  Eyes: No visual changes. ENT: No sore throat. Cardiovascular: As above Respiratory: As above  Gastrointestinal: No abdominal pain.   no vomiting.  No diarrhea.  No constipation. Genitourinary: Negative for dysuria. Musculoskeletal: Negative for back pain. Skin: Negative for rash. Neurological: Negative for headaches, focal weakness or numbness.  10-point ROS otherwise negative.  ____________________________________________   PHYSICAL EXAM:  VITAL SIGNS: ED Triage Vitals  Enc Vitals Group     BP 08/26/14 1945 145/106 mmHg     Pulse Rate 08/26/14 1945 87      Resp 08/26/14 1945 22     Temp 08/26/14 1945 98.1 F (36.7 C)     Temp Source 08/26/14 1945 Oral     SpO2 08/26/14 1945 99 %     Weight 08/26/14 1945 250 lb (113.399 kg)     Height 08/26/14 1945  (1.676 m)     Head Cir --      Peak Flow --      Pain Score 08/26/14 1946 9     Pain Loc --      Pain Edu? --      Excl. in GC? --     Constitutional: Alert and oriented. Well appearing and in no acute distress. Eyes: Conjunctivae are normal. PERRL. EOMI. Head: Atraumatic. Nose: Clear rhinorrhea with swollen nasal mucosa that is erythematous bilaterally.  Mouth/Throat: Mucous membranes are moist.  Oropharynx non-erythematous. Neck: No stridor.   Cardiovascular: Normal rate, regular rhythm. Grossly normal heart sounds.  Good peripheral circulation. Tender to palpation to the anterior chest where the patient says that she has been having chest pain. Respiratory: Normal respiratory effort.  No retractions. Lungs CTAB. Gastrointestinal: Soft and nontender. No distention. No abdominal bruits. No CVA tenderness. Musculoskeletal: No lower extremity tenderness nor edema.  No joint effusions. No tenderness to the back. Neurologic:  Normal speech and language. No gross focal neurologic deficits are appreciated. Speech is normal. No gait instability. Skin:  Skin is warm, dry and intact. No rash noted. Psychiatric: Mood and affect are normal. Speech and behavior are normal.  ____________________________________________   LABS (all labs ordered are listed, but only abnormal results are displayed)  Labs Reviewed  COMPREHENSIVE METABOLIC PANEL - Abnormal; Notable for the following:    Glucose, Bld 165 (*)    Creatinine, Ser 1.20 (*)    Total Bilirubin 0.2 (*)    GFR calc non Af Amer 53 (*)    All other components within normal limits  CBC  BRAIN NATRIURETIC PEPTIDE  TROPONIN I   ____________________________________________  EKG  ED ECG REPORT I, Arelia Longest, the attending  physician, personally viewed and interpreted this ECG.   Date: 08/26/2014  EKG Time: 1957  Rate: 77  Rhythm: normal sinus rhythm  Axis: Normal axis  Intervals:none  ST&T Change: No ST elevations or depressions. T-wave inversions in 3 and aVF as well as scooped T wave morphology in V2. This is unchanged from previous EKGs and looks largely  identical to 03/14/2014 EKG.  ____________________________________________  RADIOLOGY  Chest x-ray negative for pathology. I personally reviewed this exam. ____________________________________________   PROCEDURES    ____________________________________________   INITIAL IMPRESSION / ASSESSMENT AND PLAN / ED COURSE  Pertinent labs & imaging results that were available during my care of the patient were reviewed by me and considered in my medical decision making (see chart for details).  Patient is perc negative.  ----------------------------------------- 9:24 PM on 08/26/2014 -----------------------------------------  Patient says breathing easier after breathing treatment. Still with chest pain on deep inspiration. Given that the patient has had cough and URI symptoms 2 weeks I believe that her chest pain is related to pleurisy and chest wall pain. She has a hard he tried NSAIDs, muscle relaxers, over-the-counter rhinorrhea treatment. She says that she does work in a dusty environment and she has not allowed to wear a mask. I will give her a work note for today and tomorrow. I will also give her a prescription for albuterol and follow-up contact information for ear nose and throat. ____________________________________________   FINAL CLINICAL IMPRESSION(S) / ED DIAGNOSES  Acute chest pain. Acute pleurisy. Rhinorrhea. Return visit.    Myrna Blazer, MD 08/26/14 2125

## 2014-08-26 NOTE — ED Notes (Signed)
Patient having difficulty finishing sentences without losing breath.  C/o cough with yellow productive sputum.  Has had recent dx of URI.

## 2014-08-26 NOTE — ED Notes (Signed)
Patient states onset of chest pain, nasal congestion and chest congestion, upper back pain and pain with deep inspiration. Able to speak in complete sentences.

## 2014-08-26 NOTE — ED Notes (Signed)
Patient transported to X-ray 

## 2014-08-26 NOTE — ED Notes (Signed)
Pt is here for worsening SOB with URI.  Pt was dx with bronchitis last week and placed on and antibiotic.  Pt has been having worsening SOB and her chest and back are sore from all the coughing. Pt states that she cant catch her breath.

## 2014-09-01 ENCOUNTER — Encounter: Payer: Self-pay | Admitting: Emergency Medicine

## 2014-09-01 ENCOUNTER — Emergency Department
Admission: EM | Admit: 2014-09-01 | Discharge: 2014-09-01 | Disposition: A | Discharge status: Home / Self Care | Payer: No Typology Code available for payment source | Attending: Emergency Medicine | Admitting: Emergency Medicine

## 2014-09-01 DIAGNOSIS — J019 Acute sinusitis, unspecified: Secondary | ICD-10-CM | POA: Diagnosis not present

## 2014-09-01 DIAGNOSIS — E119 Type 2 diabetes mellitus without complications: Secondary | ICD-10-CM | POA: Diagnosis not present

## 2014-09-01 DIAGNOSIS — J069 Acute upper respiratory infection, unspecified: Secondary | ICD-10-CM | POA: Insufficient documentation

## 2014-09-01 DIAGNOSIS — Z79899 Other long term (current) drug therapy: Secondary | ICD-10-CM | POA: Insufficient documentation

## 2014-09-01 DIAGNOSIS — H9203 Otalgia, bilateral: Secondary | ICD-10-CM | POA: Insufficient documentation

## 2014-09-01 DIAGNOSIS — R05 Cough: Secondary | ICD-10-CM | POA: Diagnosis present

## 2014-09-01 DIAGNOSIS — I10 Essential (primary) hypertension: Secondary | ICD-10-CM | POA: Diagnosis not present

## 2014-09-01 DIAGNOSIS — Z792 Long term (current) use of antibiotics: Secondary | ICD-10-CM | POA: Diagnosis not present

## 2014-09-01 MED ORDER — HYDROCODONE-CHLORPHENIRAMINE 5-4 MG/5ML PO SOLN: 5.0000 mL | Freq: Two times a day (BID) | ORAL | Status: DC | PRN

## 2014-09-01 MED ORDER — AMOXICILLIN-POT CLAVULANATE 875-125 MG PO TABS: 1.0000 | ORAL_TABLET | Freq: Two times a day (BID) | ORAL | Status: AC

## 2014-09-01 NOTE — ED Notes (Signed)
C/o cough, nasal congestion, sore throat, and bilateral ear pain

## 2014-09-01 NOTE — ED Provider Notes (Signed)
[facility] Emergency Department Provider Note  ____________________________________________  Time seen: Approximately 5:33 PM  I have reviewed the triage vital signs and the nursing notes.    HISTORY  Chief Complaint Cough and Nasal Congestion   HPI Michele [name] is a 49 y.o. female presents to the ER for the complaint of runny nose, congestion, green nasal discharge and bilateral ear pain. Patient reports that she has had similar over the last 2 weeks. Patient states that the congestion in her chest has now resolved, however still with facial congestion. Reports she still has intermittent cough, states cough is mostly at night. Also reports nasal drainage at night.  Reports blows her nose and gets very thick green and yellow drainage. Denies fever. States that she has a facial pressure pain and rates it at 5 out of 10.  Denies chest pain, shortness of breath, abdominal pain, pain with deep breaths, decreased appetite, decreased oral intake. Patient denies history of allergies.   Past Medical History  Diagnosis Date  . Hypertension   . Diabetes mellitus without complication   . Migraines     There are no active problems to display for this patient.   Past Surgical History  Procedure Laterality Date  . Cesarean section    . Knee surgery    . Ankle surgery      Current Outpatient Rx  Name  Route  Sig  Dispense  Refill  . albuterol (PROVENTIL HFA;VENTOLIN HFA) 108 (90 BASE) MCG/ACT inhaler   Inhalation   Inhale 2 puffs into the lungs every 6 (six) hours as needed for wheezing or shortness of breath.   1 Inhaler   2   . amitriptyline (ELAVIL) 50 MG tablet   Oral   Take 50 mg by mouth at bedtime.         Marland Kitchen. amoxicillin-clavulanate (AUGMENTIN) 875-125 MG per tablet   Oral   Take 1 tablet by mouth every 12 (twelve) hours.   20 tablet   0   . benzonatate (TESSALON PERLES) 100 MG capsule   Oral   Take 1 capsule (100 mg total) by mouth  every 6 (six) hours as needed for cough. Patient not taking: Reported on 08/26/2014   30 capsule   0   . cyclobenzaprine (FLEXERIL) 10 MG tablet   Oral   Take 1 tablet (10 mg total) by mouth every 8 (eight) hours as needed for muscle spasms. Patient not taking: Reported on 08/26/2014   30 tablet   0   . Hydrocodone-Chlorpheniramine 5-4 MG/5ML SOLN   Oral   Take 5 mLs by mouth 2 (two) times daily as needed (cough or congestion).   50 mL   0   . loratadine (CLARITIN) 10 MG tablet   Oral   Take 10 mg by mouth daily.         . metFORMIN (GLUMETZA) 500 MG (MOD) 24 hr tablet   Oral   Take 500 mg by mouth 2 (two) times daily with a meal.         . SUMAtriptan-naproxen (TREXIMET) 85-500 MG per tablet   Oral   Take 1 tablet by mouth every 2 (two) hours as needed for migraine.         . topiramate (TOPAMAX) 100 MG tablet   Oral   Take 100 mg by mouth 2 (two) times daily.         Marland Kitchen. triamterene-hydrochlorothiazide (MAXZIDE-25) 37.5-25 MG per tablet   Oral   Take 1  tablet by mouth daily.         . verapamil (COVERA HS) 180 MG (CO) 24 hr tablet   Oral   Take 180 mg by mouth at bedtime.           Allergies Review of patient's allergies indicates no known allergies.  No family history on file.  Social History History  Substance Use Topics  . Smoking status: Never Smoker   . Smokeless tobacco: Current User    Types: Snuff  . Alcohol Use: Yes     Comment: occasionally    Review of Systems Constitutional: No fever/chills Eyes: No visual changes. ENT: Positive for congestion, ear pain and sore throat Cardiovascular: Denies chest pain. Respiratory: Denies shortness of breath. Gastrointestinal: No abdominal pain.  No nausea, no vomiting.  No diarrhea.  No constipation. Genitourinary: Negative for dysuria. Musculoskeletal: Negative for back pain. Skin: Negative for rash. Neurological: Negative for headaches, focal weakness or numbness.  10-point ROS otherwise  negative.  ____________________________________________   PHYSICAL EXAM:  VITAL SIGNS: ED Triage Vitals  Enc Vitals Group     BP 09/01/14 1720 127/77 mmHg     Pulse Rate 09/01/14 1720 92     Resp 09/01/14 1720 18     Temp 09/01/14 1720 98.6 F (37 C)     Temp Source 09/01/14 1720 Oral     SpO2 09/01/14 1720 99 %     Weight --      Height --      Head Cir --      Peak Flow --      Pain Score 09/01/14 1722 10     Pain Loc --      Pain Edu? --      Excl. in GC? --     Constitutional: Alert and oriented. Well appearing and in no acute distress. Eyes: Conjunctivae are normal. PERRL. EOMI. Head: Atraumatic. Bilateral maxillary and frontal sinus pressure, mild to moderate tender to palpation. Nose: greenish nasal discharge Ears: Left: mild erythema, normal TMs, Right: no erythema, normal TMs.  Mouth/Throat: Mucous membranes are moist.  Pharynx mild erythema. No tonsillar swelling or exudate.  Neck: No stridor.  No cervical spine tenderness to palpation. Hematological/Lymphatic/Immunilogical: No cervical lymphadenopathy. Cardiovascular: Normal rate, regular rhythm. Grossly normal heart sounds.  Good peripheral circulation. Respiratory: Normal respiratory effort.  No retractions. Lungs CTAB. No wheezes, rales or rhonchi. Good air movement.  Gastrointestinal: Soft and nontender. Obese abdomen. No abdominal bruits. No CVA tenderness. Musculoskeletal: No lower extremity tenderness nor edema.  No joint effusions. Lateral pedal pulses equal and easily found. Neurologic:  Normal speech and language. No gross focal neurologic deficits are appreciated. Speech is normal. No gait instability. Skin:  Skin is warm, dry and intact. No rash noted. Psychiatric: Mood and affect are normal. Speech and behavior are normal.  _PERC negative___________________________________________   LABS (all labs ordered are listed, but only abnormal results are displayed)  Labs Reviewed - No data to  display ____________________________________________  _ ____________________________________________   INITIAL IMPRESSION / ASSESSMENT AND PLAN / ED COURSE  Pertinent labs & imaging results that were available during my care of the patient were reviewed by me and considered in my medical decision making (see chart for details).  No acute distress. Very well-appearing patient. Presents to the ER for the complaints of congestion and ear pain. Reports she has had similar for several weeks however states that is now only in the facial area. States chest congestion has improved. Denies chest  pain, shortness of breath or difficulty breathing. Lungs clear throughout.   Will treat patient with oral Augmentin for sinusitis. Cough suppressant for qhs as pt reports cough is mostly at night and keeps her up. Discussed need to follow-up with her primary care physician this week for follow-up. Patient verbalized understanding and agreed to plan.  ____________________________________________   FINAL CLINICAL IMPRESSION(S) / ED DIAGNOSES  Final diagnoses:  Acute sinusitis, recurrence not specified, unspecified location  URI (upper respiratory infection)      Renford Dills, NP 09/01/14 1850  Loleta Rose, MD 09/02/14 0030

## 2014-09-01 NOTE — Discharge Instructions (Signed)
Take medication as prescribed. Drink plenty of water. Rest.   Follow up with your primary care physician this week.   Return to ER for new or worsening concerns.   Sinusitis Sinusitis is redness, soreness, and inflammation of the paranasal sinuses. Paranasal sinuses are air pockets within the bones of your face (beneath the eyes, the middle of the forehead, or above the eyes). In healthy paranasal sinuses, mucus is able to drain out, and air is able to circulate through them by way of your nose. However, when your paranasal sinuses are inflamed, mucus and air can become trapped. This can allow bacteria and other germs to grow and cause infection. Sinusitis can develop quickly and last only a short time (acute) or continue over a long period (chronic). Sinusitis that lasts for more than 12 weeks is considered chronic.  CAUSES  Causes of sinusitis include:  Allergies.  Structural abnormalities, such as displacement of the cartilage that separates your nostrils (deviated septum), which can decrease the air flow through your nose and sinuses and affect sinus drainage.  Functional abnormalities, such as when the small hairs (cilia) that line your sinuses and help remove mucus do not work properly or are not present. SIGNS AND SYMPTOMS  Symptoms of acute and chronic sinusitis are the same. The primary symptoms are pain and pressure around the affected sinuses. Other symptoms include:  Upper toothache.  Earache.  Headache.  Bad breath.  Decreased sense of smell and taste.  A cough, which worsens when you are lying flat.  Fatigue.  Fever.  Thick drainage from your nose, which often is green and may contain pus (purulent).  Swelling and warmth over the affected sinuses. DIAGNOSIS  Your health care provider will perform a physical exam. During the exam, your health care provider may:  Look in your nose for signs of abnormal growths in your nostrils (nasal polyps).  Tap over the  affected sinus to check for signs of infection.  View the inside of your sinuses (endoscopy) using an imaging device that has a light attached (endoscope). If your health care provider suspects that you have chronic sinusitis, one or more of the following tests may be recommended:  Allergy tests.  Nasal culture. A sample of mucus is taken from your nose, sent to a lab, and screened for bacteria.  Nasal cytology. A sample of mucus is taken from your nose and examined by your health care provider to determine if your sinusitis is related to an allergy. TREATMENT  Most cases of acute sinusitis are related to a viral infection and will resolve on their own within 10 days. Sometimes medicines are prescribed to help relieve symptoms (pain medicine, decongestants, nasal steroid sprays, or saline sprays).  However, for sinusitis related to a bacterial infection, your health care provider will prescribe antibiotic medicines. These are medicines that will help kill the bacteria causing the infection.  Rarely, sinusitis is caused by a fungal infection. In theses cases, your health care provider will prescribe antifungal medicine. For some cases of chronic sinusitis, surgery is needed. Generally, these are cases in which sinusitis recurs more than 3 times per year, despite other treatments. HOME CARE INSTRUCTIONS   Drink plenty of water. Water helps thin the mucus so your sinuses can drain more easily.  Use a humidifier.  Inhale steam 3 to 4 times a day (for example, sit in the bathroom with the shower running).  Apply a warm, moist washcloth to your face 3 to 4 times a day,  or as directed by your health care provider.  Use saline nasal sprays to help moisten and clean your sinuses.  Take medicines only as directed by your health care provider.  If you were prescribed either an antibiotic or antifungal medicine, finish it all even if you start to feel better. SEEK IMMEDIATE MEDICAL CARE IF:  You  have increasing pain or severe headaches.  You have nausea, vomiting, or drowsiness.  You have swelling around your face.  You have vision problems.  You have a stiff neck.  You have difficulty breathing. MAKE SURE YOU:   Understand these instructions.  Will watch your condition.  Will get help right away if you are not doing well or get worse. Document Released: 02/19/2005 Document Revised: 07/06/2013 Document Reviewed: 03/06/2011 [facility] Patient Information 2015 [employer] [place]. This information is not intended to replace advice given to you by your health care provider. Make sure you discuss any questions you have with your health care provider.

## 2014-10-03 ENCOUNTER — Emergency Department: Payer: PRIVATE HEALTH INSURANCE

## 2014-10-03 ENCOUNTER — Encounter: Payer: Self-pay | Admitting: Emergency Medicine

## 2014-10-03 ENCOUNTER — Emergency Department
Admission: EM | Admit: 2014-10-03 | Discharge: 2014-10-03 | Disposition: A | Discharge status: Home / Self Care | Payer: PRIVATE HEALTH INSURANCE | Attending: Emergency Medicine | Admitting: Emergency Medicine

## 2014-10-03 DIAGNOSIS — Z79899 Other long term (current) drug therapy: Secondary | ICD-10-CM | POA: Insufficient documentation

## 2014-10-03 DIAGNOSIS — I10 Essential (primary) hypertension: Secondary | ICD-10-CM | POA: Insufficient documentation

## 2014-10-03 DIAGNOSIS — E119 Type 2 diabetes mellitus without complications: Secondary | ICD-10-CM | POA: Insufficient documentation

## 2014-10-03 DIAGNOSIS — Z9889 Other specified postprocedural states: Secondary | ICD-10-CM | POA: Diagnosis not present

## 2014-10-03 DIAGNOSIS — M25571 Pain in right ankle and joints of right foot: Secondary | ICD-10-CM | POA: Diagnosis present

## 2014-10-03 DIAGNOSIS — G8929 Other chronic pain: Secondary | ICD-10-CM | POA: Diagnosis not present

## 2014-10-03 MED ORDER — KETOROLAC TROMETHAMINE 10 MG PO TABS
10.0000 mg | ORAL_TABLET | Freq: Three times a day (TID) | ORAL | Status: DC
Start: 2014-10-03 — End: 2015-06-14

## 2014-10-03 MED ORDER — TRAMADOL HCL 50 MG PO TABS: 50.0000 mg | ORAL_TABLET | Freq: Two times a day (BID) | ORAL | Status: DC

## 2014-10-03 NOTE — ED Notes (Addendum)
Pt presents to the Er from home with complaints of right ankle pain, pt reports she had surgery about 3 years ago rpeorts usually pain to right ankle but about 2 days ago pain has increased pt unable to bear any weight to right ankle.

## 2014-10-03 NOTE — Discharge Instructions (Signed)
Ankle Pain Ankle pain is a common symptom. The bones, cartilage, tendons, and muscles of the ankle joint perform a lot of work each day. The ankle joint holds your body weight and allows you to move around. Ankle pain can occur on either side or back of 1 or both ankles. Ankle pain may be sharp and burning or dull and aching. There may be tenderness, stiffness, redness, or warmth around the ankle. The pain occurs more often when a person walks or puts pressure on the ankle. CAUSES  There are many reasons ankle pain can develop. It is important to work with your caregiver to identify the cause since many conditions can impact the bones, cartilage, muscles, and tendons. Causes for ankle pain include:  Injury, including a break (fracture), sprain, or strain often due to a fall, sports, or a high-impact activity.  Swelling (inflammation) of a tendon (tendonitis).  Achilles tendon rupture.  Ankle instability after repeated sprains and strains.  Poor foot alignment.  Pressure on a nerve (tarsal tunnel syndrome).  Arthritis in the ankle or the lining of the ankle.  Crystal formation in the ankle (gout or pseudogout). DIAGNOSIS  A diagnosis is based on your medical history, your symptoms, results of your physical exam, and results of diagnostic tests. Diagnostic tests may include X-ray exams or a computerized magnetic scan (magnetic resonance imaging, MRI). TREATMENT  Treatment will depend on the cause of your ankle pain and may include:  Keeping pressure off the ankle and limiting activities.  Using crutches or other walking support (a cane or brace).  Using rest, ice, compression, and elevation.  Participating in physical therapy or home exercises.  Wearing shoe inserts or special shoes.  Losing weight.  Taking medications to reduce pain or swelling or receiving an injection.  Undergoing surgery. HOME CARE INSTRUCTIONS   Only take over-the-counter or prescription medicines for  pain, discomfort, or fever as directed by your caregiver.  Put ice on the injured area.  Put ice in a plastic bag.  Place a towel between your skin and the bag.  Leave the ice on for 15-20 minutes at a time, 03-04 times a day.  Keep your leg raised (elevated) when possible to lessen swelling.  Avoid activities that cause ankle pain.  Follow specific exercises as directed by your caregiver.  Record how often you have ankle pain, the location of the pain, and what it feels like. This information may be helpful to you and your caregiver.  Ask your caregiver about returning to work or sports and whether you should drive.  Follow up with your caregiver for further examination, therapy, or testing as directed. SEEK MEDICAL CARE IF:   Pain or swelling continues or worsens beyond 1 week.  You have an oral temperature above 102 F (38.9 C).  You are feeling unwell or have chills.  You are having an increasingly difficult time with walking.  You have loss of sensation or other new symptoms.  You have questions or concerns. MAKE SURE YOU:   Understand these instructions.  Will watch your condition.  Will get help right away if you are not doing well or get worse. Document Released: 08/09/2009 Document Revised: 05/14/2011 Document Reviewed: 08/09/2009 [facility] Patient Information 2015 [place], [place]. This information is not intended to replace advice given to you by your health care provider. Make sure you discuss any questions you have with your health care provider.  Chronic Pain Chronic pain can be defined as pain that is off and  on and lasts for 3-6 months or longer. Many things cause chronic pain, which can make it difficult to make a diagnosis. There are many treatment options available for chronic pain. However, finding a treatment that works well for you may require trying various approaches until the right one is found. Many people benefit from a combination of two or more  types of treatment to control their pain. SYMPTOMS  Chronic pain can occur anywhere in the body and can range from mild to very severe. Some types of chronic pain include:  Headache.  Low back pain.  Cancer pain.  Arthritis pain.  Neurogenic pain. This is pain resulting from damage to nerves. People with chronic pain may also have other symptoms such as:  Depression.  Anger.  Insomnia.  Anxiety. DIAGNOSIS  Your health care provider will help diagnose your condition over time. In many cases, the initial focus will be on excluding possible conditions that could be causing the pain. Depending on your symptoms, your health care provider may order tests to diagnose your condition. Some of these tests may include:   Blood tests.   CT scan.   MRI.   X-rays.   Ultrasounds.   Nerve conduction studies.  You may need to see a specialist.  TREATMENT  Finding treatment that works well may take time. You may be referred to a pain specialist. He or she may prescribe medicine or therapies, such as:   Mindful meditation or yoga.  Shots (injections) of numbing or pain-relieving medicines into the spine or area of pain.  Local electrical stimulation.  Acupuncture.   Massage therapy.   Aroma, color, light, or sound therapy.   Biofeedback.   Working with a physical therapist to keep from getting stiff.   Regular, gentle exercise.   Cognitive or behavioral therapy.   Group support.  Sometimes, surgery may be recommended.  HOME CARE INSTRUCTIONS   Take all medicines as directed by your health care provider.   Lessen stress in your life by relaxing and doing things such as listening to calming music.   Exercise or be active as directed by your health care provider.   Eat a healthy diet and include things such as vegetables, fruits, fish, and lean meats in your diet.   Keep all follow-up appointments with your health care provider.   Attend a  support group with others suffering from chronic pain. SEEK MEDICAL CARE IF:   Your pain gets worse.   You develop a new pain that was not there before.   You cannot tolerate medicines given to you by your health care provider.   You have new symptoms since your last visit with your health care provider.  SEEK IMMEDIATE MEDICAL CARE IF:   You feel weak.   You have decreased sensation or numbness.   You lose control of bowel or bladder function.   Your pain suddenly gets much worse.   You develop shaking.  You develop chills.  You develop confusion.  You develop chest pain.  You develop shortness of breath.  MAKE SURE YOU:  Understand these instructions.  Will watch your condition.  Will get help right away if you are not doing well or get worse. Document Released: 11/11/2001 Document Revised: 10/22/2012 Document Reviewed: 08/15/2012 [facility] Patient Information 2015 [place], [place]. This information is not intended to replace advice given to you by your health care provider. Make sure you discuss any questions you have with your health care provider.  Take the prescription  pain & inflammation meds as directed.  Follow-up with [facility] as scheduled on August 10th.  Wear the ace bandage and rest with the leg elevated as needed.

## 2014-10-03 NOTE — ED Provider Notes (Signed)
[facility] Emergency Department Provider Note ____________________________________________  Time seen: 1729  I have reviewed the triage vital signs and the nursing notes.  HISTORY  Chief Complaint  Ankle Pain  HPI Michele [name] is a 49 y.o. female ports to the ED for evaluation and management of right ankle pain. She denies injury or trauma, but reports increasing pain over the last 2 days to 1 week. She reports the pain is worsened by prolonged standing and walking. She gives a remote history of ankle ORIF 3 years prior with subsequent revision 2 years prior. Her [name] was her orthopedic surgeon for the 2 procedures, and she has not seen him since. She claims now that she has an appointment scheduled for August 10 at [facility]. She is not seeing her primary care provider for this complaint since onset. She reports that her pain is not controlled by dosing over-the-counter Tylenol and Motrin. Requesting pain management for her acute flare of chronic pain. The pain at a 9/10 in triage.  Past Medical History  Diagnosis Date  . Hypertension   . Diabetes mellitus without complication   . Migraines    There are no active problems to display for this patient.  Past Surgical History  Procedure Laterality Date  . Cesarean section    . Knee surgery    . Ankle surgery      Current Outpatient Rx  Name  Route  Sig  Dispense  Refill  . albuterol (PROVENTIL HFA;VENTOLIN HFA) 108 (90 BASE) MCG/ACT inhaler   Inhalation   Inhale 2 puffs into the lungs every 6 (six) hours as needed for wheezing or shortness of breath.   1 Inhaler   2   . amitriptyline (ELAVIL) 50 MG tablet   Oral   Take 50 mg by mouth at bedtime.         . benzonatate (TESSALON PERLES) 100 MG capsule   Oral   Take 1 capsule (100 mg total) by mouth every 6 (six) hours as needed for cough. Patient not taking: Reported on 08/26/2014   30 capsule   0   . cyclobenzaprine (FLEXERIL) 10 MG  tablet   Oral   Take 1 tablet (10 mg total) by mouth every 8 (eight) hours as needed for muscle spasms. Patient not taking: Reported on 08/26/2014   30 tablet   0   . Hydrocodone-Chlorpheniramine 5-4 MG/5ML SOLN   Oral   Take 5 mLs by mouth 2 (two) times daily as needed (cough or congestion).   50 mL   0   . ketorolac (TORADOL) 10 MG tablet   Oral   Take 1 tablet (10 mg total) by mouth every 8 (eight) hours.   15 tablet   0   . loratadine (CLARITIN) 10 MG tablet   Oral   Take 10 mg by mouth daily.         . metFORMIN (GLUMETZA) 500 MG (MOD) 24 hr tablet   Oral   Take 500 mg by mouth 2 (two) times daily with a meal.         . SUMAtriptan-naproxen (TREXIMET) 85-500 MG per tablet   Oral   Take 1 tablet by mouth every 2 (two) hours as needed for migraine.         . topiramate (TOPAMAX) 100 MG tablet   Oral   Take 100 mg by mouth 2 (two) times daily.         . traMADol (ULTRAM) 50 MG tablet  Oral   Take 1 tablet (50 mg total) by mouth 2 (two) times daily.   10 tablet   0   . triamterene-hydrochlorothiazide (MAXZIDE-25) 37.5-25 MG per tablet   Oral   Take 1 tablet by mouth daily.         . verapamil (COVERA HS) 180 MG (CO) 24 hr tablet   Oral   Take 180 mg by mouth at bedtime.          Allergies Review of patient's allergies indicates no known allergies.  History reviewed. No pertinent family history.  Social History History  Substance Use Topics  . Smoking status: Never Smoker   . Smokeless tobacco: Current User    Types: Snuff  . Alcohol Use: Yes     Comment: occasionally   Review of Systems  Constitutional: Negative for fever. Eyes: Negative for visual changes. ENT: Negative for sore throat. Cardiovascular: Negative for chest pain. Respiratory: Negative for shortness of breath. Gastrointestinal: Negative for abdominal pain, vomiting and diarrhea. Genitourinary: Negative for dysuria. Musculoskeletal: Negative for back pain. Right ankle  pain as above. Skin: Negative for rash. Neurological: Negative for headaches, focal weakness or numbness. ____________________________________________  PHYSICAL EXAM:  VITAL SIGNS: ED Triage Vitals  Enc Vitals Group     BP 10/03/14 1631 151/87 mmHg     Pulse Rate 10/03/14 1631 99     Resp 10/03/14 1631 18     Temp 10/03/14 1631 98.3 F (36.8 C)     Temp Source 10/03/14 1631 Oral     SpO2 10/03/14 1631 97 %     Weight 10/03/14 1631 245 lb (111.131 kg)     Height 10/03/14 1631 5\' 6"  (1.676 m)     Head Cir --      Peak Flow --      Pain Score 10/03/14 1646 9     Pain Loc --      Pain Edu? --      Excl. in GC? --    Constitutional: Alert and oriented. Well appearing and in no distress. Eyes: Conjunctivae are normal. PERRL. Normal extraocular movements. ENT   Head: Normocephalic and atraumatic.   Nose: No congestion/rhinnorhea.   Mouth/Throat: Mucous membranes are moist.   Neck: Supple. No thyromegaly. Hematological/Lymphatic/Immunilogical: No cervical lymphadenopathy. Cardiovascular: Normal rate, regular rhythm.  Respiratory: Normal respiratory effort. No wheezes/rales/rhonchi. Gastrointestinal: Soft and nontender. No distention. Musculoskeletal: right ankle without deformity, ecchymosis, effusion, or swelling. Normal ankle ROM without laxity. Negative drawer. No calf or achilles tenderness. Nontender with normal range of motion in all extremities.  Neurologic:  Normal gait without ataxia. Normal speech and language. No gross focal neurologic deficits are appreciated. Skin:  Skin is warm, dry and intact. No rash noted. Psychiatric: Mood and affect are normal. Patient exhibits appropriate insight and judgment. ____________________________________________   RADIOLOGY  Right Ankle IMPRESSION: No fracture or dislocation is seen  I, Rochell Mabie, Charlesetta Ivory, personally viewed and evaluated these images as part of my medical decision making.   ____________________________________________  PROCEDURES  Ace bandage applied ____________________________________________  INITIAL IMPRESSION / ASSESSMENT AND PLAN / ED COURSE  Radiology results to patient. Acute on chronic right ankle pain. No acute injury or trauma reported. Treatment with ace bandage, Toradol, and Ultram.  Follow-up with Dr. Ernest Pine as scheduled on October 13, 2014. ____________________________________________  FINAL CLINICAL IMPRESSION(S) / ED DIAGNOSES  Final diagnoses:  Ankle pain, right  Chronic pain of right ankle     Lissa Hoard, PA-C 10/03/14 1819  Emily Filbert, MD 10/03/14 [phone]

## 2014-10-03 NOTE — ED Notes (Signed)
Pt in no acute distress throuhgout visit today. RN in communication with NP throughout treatment.

## 2014-12-16 DIAGNOSIS — M542 Cervicalgia: Secondary | ICD-10-CM | POA: Insufficient documentation

## 2015-03-20 ENCOUNTER — Emergency Department
Admission: EM | Admit: 2015-03-20 | Discharge: 2015-03-20 | Disposition: A | Discharge status: Home / Self Care | Payer: PRIVATE HEALTH INSURANCE | Attending: Emergency Medicine | Admitting: Emergency Medicine

## 2015-03-20 ENCOUNTER — Other Ambulatory Visit: Payer: Self-pay

## 2015-03-20 ENCOUNTER — Emergency Department: Payer: PRIVATE HEALTH INSURANCE

## 2015-03-20 ENCOUNTER — Encounter: Payer: Self-pay | Admitting: Emergency Medicine

## 2015-03-20 DIAGNOSIS — M79601 Pain in right arm: Secondary | ICD-10-CM | POA: Insufficient documentation

## 2015-03-20 DIAGNOSIS — E119 Type 2 diabetes mellitus without complications: Secondary | ICD-10-CM | POA: Insufficient documentation

## 2015-03-20 DIAGNOSIS — Z791 Long term (current) use of non-steroidal anti-inflammatories (NSAID): Secondary | ICD-10-CM | POA: Insufficient documentation

## 2015-03-20 DIAGNOSIS — M7918 Myalgia, other site: Secondary | ICD-10-CM

## 2015-03-20 DIAGNOSIS — I10 Essential (primary) hypertension: Secondary | ICD-10-CM | POA: Diagnosis not present

## 2015-03-20 DIAGNOSIS — R091 Pleurisy: Secondary | ICD-10-CM | POA: Insufficient documentation

## 2015-03-20 DIAGNOSIS — Z79899 Other long term (current) drug therapy: Secondary | ICD-10-CM | POA: Diagnosis not present

## 2015-03-20 DIAGNOSIS — R0789 Other chest pain: Secondary | ICD-10-CM | POA: Diagnosis not present

## 2015-03-20 DIAGNOSIS — Z7984 Long term (current) use of oral hypoglycemic drugs: Secondary | ICD-10-CM | POA: Diagnosis not present

## 2015-03-20 DIAGNOSIS — M25532 Pain in left wrist: Secondary | ICD-10-CM | POA: Insufficient documentation

## 2015-03-20 DIAGNOSIS — M79605 Pain in left leg: Secondary | ICD-10-CM | POA: Insufficient documentation

## 2015-03-20 DIAGNOSIS — R51 Headache: Secondary | ICD-10-CM | POA: Insufficient documentation

## 2015-03-20 DIAGNOSIS — M25522 Pain in left elbow: Secondary | ICD-10-CM | POA: Diagnosis not present

## 2015-03-20 DIAGNOSIS — M79602 Pain in left arm: Secondary | ICD-10-CM | POA: Diagnosis present

## 2015-03-20 LAB — BASIC METABOLIC PANEL
ANION GAP: 9 (ref 5–15)
BUN: 12 mg/dL (ref 6–20)
CALCIUM: 9.1 mg/dL (ref 8.9–10.3)
CO2: 24 mmol/L (ref 22–32)
CREATININE: 1.11 mg/dL — AB (ref 0.44–1.00)
Chloride: 104 mmol/L (ref 101–111)
GFR calc Af Amer: >60 mL/min (ref >60)
GFR, EST NON AFRICAN AMERICAN: 58 mL/min — AB (ref >60)
GLUCOSE: 215 mg/dL — AB (ref 65–99)
Potassium: 3.3 mmol/L — ABNORMAL LOW (ref 3.5–5.1)
Sodium: 137 mmol/L (ref 135–145)

## 2015-03-20 LAB — CBC
HCT: 42.7 % (ref 35.0–47.0)
HEMOGLOBIN: 13.8 g/dL (ref 12.0–16.0)
MCH: 26 pg (ref 26.0–34.0)
MCHC: 32.3 g/dL (ref 32.0–36.0)
MCV: 80.3 fL (ref 80.0–100.0)
PLATELETS: 241 10*3/uL (ref 150–440)
RBC: 5.32 MIL/uL — ABNORMAL HIGH (ref 3.80–5.20)
RDW: 13.5 % (ref 11.5–14.5)
WBC: 7.2 10*3/uL (ref 3.6–11.0)

## 2015-03-20 LAB — TROPONIN I

## 2015-03-20 MED ORDER — IBUPROFEN 800 MG PO TABS: 800.0000 mg | ORAL_TABLET | Freq: Three times a day (TID) | ORAL | Status: DC | PRN

## 2015-03-20 MED ORDER — HYDROCODONE-ACETAMINOPHEN 5-325 MG PO TABS
1.0000 | ORAL_TABLET | Freq: Once | ORAL | Status: AC
  Administered 2015-03-20: 1 via ORAL
  Filled 2015-03-20: qty 1

## 2015-03-20 MED ORDER — KETOROLAC TROMETHAMINE 30 MG/ML IJ SOLN
15.0000 mg | Freq: Once | INTRAMUSCULAR | Status: AC
  Administered 2015-03-20: 15 mg via INTRAVENOUS
  Filled 2015-03-20: qty 1

## 2015-03-20 MED ORDER — HYDROCODONE-ACETAMINOPHEN 5-325 MG PO TABS: 1.0000 | ORAL_TABLET | Freq: Four times a day (QID) | ORAL | Status: DC | PRN

## 2015-03-20 NOTE — Discharge Instructions (Signed)
Musculoskeletal Pain Musculoskeletal pain is muscle and boney aches and pains. These pains can occur in any part of the body. Your caregiver may treat you without knowing the cause of the pain. They may treat you if blood or urine tests, X-rays, and other tests were normal.  CAUSES There is often not a definite cause or reason for these pains. These pains may be caused by a type of germ (virus). The discomfort may also come from overuse. Overuse includes working out too hard when your body is not fit. Boney aches also come from weather changes. Bone is sensitive to atmospheric pressure changes. HOME CARE INSTRUCTIONS   Ask when your test results will be ready. Make sure you get your test results.  Only take over-the-counter or prescription medicines for pain, discomfort, or fever as directed by your caregiver. If you were given medications for your condition, do not drive, operate machinery or power tools, or sign legal documents for 24 hours. Do not drink alcohol. Do not take sleeping pills or other medications that may interfere with treatment.  Continue all activities unless the activities cause more pain. When the pain lessens, slowly resume normal activities. Gradually increase the intensity and duration of the activities or exercise.  During periods of severe pain, bed rest may be helpful. Lay or sit in any position that is comfortable.  Putting ice on the injured area.  Put ice in a bag.  Place a towel between your skin and the bag.  Leave the ice on for 15 to 20 minutes, 3 to 4 times a day.  Follow up with your caregiver for continued problems and no reason can be found for the pain. If the pain becomes worse or does not go away, it may be necessary to repeat tests or do additional testing. Your caregiver may need to look further for a possible cause. SEEK IMMEDIATE MEDICAL CARE IF:  You have pain that is getting worse and is not relieved by medications.  You develop chest pain  that is associated with shortness or breath, sweating, feeling sick to your stomach (nauseous), or throw up (vomit).  Your pain becomes localized to the abdomen.  You develop any new symptoms that seem different or that concern you. MAKE SURE YOU:   Understand these instructions.  Will watch your condition.  Will get help right away if you are not doing well or get worse.   This information is not intended to replace advice given to you by your health care provider. Make sure you discuss any questions you have with your health care provider.   Document Released: 02/19/2005 Document Revised: 05/14/2011 Document Reviewed: 10/24/2012 Elsevier Interactive Patient Education [employer]2016 [employer]  Please return immediately if condition worsens. Please contact her primary physician or the physician you were given for referral. If you have any specialist physicians involved in her treatment and plan please also contact them. Thank you for using [facility]. Please follow-up with a primary physician as you may need to be investigated for diffuse arthritides such as lupus, rheumatoid, etc. Today's evaluation was for temporary pain relief but you require further outpatient testing. Return to the emergency department for fever or any other new concerns

## 2015-03-20 NOTE — ED Notes (Signed)
C/o chest pain and sob x 4 days.  NAD, skin w/d, lungs clear.  Also reports bilat elbow pain.

## 2015-03-20 NOTE — ED Provider Notes (Signed)
Time Seen: Approximately ----------------------------------------- 11:50 AM on 03/20/2015 -----------------------------------------   I have reviewed the triage notes  Chief Complaint: Arm Pain and Pleurisy   History of Present Illness: Michele [name] is a 50 y.o. female who is presenting with diffuse pain mainly in her left upper extremity and points mainly to the lateral surface at the level of her elbow. She also has some mild pain in the right upper extremity and also a pleuritic type chest pain and chest wall discomfort. Patient states her pain is worse with movement and she's tried over-the-counter Motrin without any significant relief. She denies any fever at home. She denies any obvious back or flank pain that she's noticed some occasional discomfort on the lateral surface of her left leg. She denies any focal weakness in either upper or lower extremities. She states due to the discomfort and lack of sleep she's developed a frontal headache which she states is not a new type of headache for her. Denies much in way of a change in her blood sugar at home.   Past Medical History  Diagnosis Date  . Hypertension   . Diabetes mellitus without complication (HCC)   . Migraines     There are no active problems to display for this patient.   Past Surgical History  Procedure Laterality Date  . Cesarean section    . Knee surgery    . Ankle surgery      Past Surgical History  Procedure Laterality Date  . Cesarean section    . Knee surgery    . Ankle surgery      Current Outpatient Rx  Name  Route  Sig  Dispense  Refill  . albuterol (PROVENTIL HFA;VENTOLIN HFA) 108 (90 BASE) MCG/ACT inhaler   Inhalation   Inhale 2 puffs into the lungs every 6 (six) hours as needed for wheezing or shortness of breath.   1 Inhaler   2   . amitriptyline (ELAVIL) 50 MG tablet   Oral   Take 50 mg by mouth at bedtime.         . benzonatate (TESSALON PERLES) 100 MG capsule   Oral   Take  1 capsule (100 mg total) by mouth every 6 (six) hours as needed for cough. Patient not taking: Reported on 08/26/2014   30 capsule   0   . cyclobenzaprine (FLEXERIL) 10 MG tablet   Oral   Take 1 tablet (10 mg total) by mouth every 8 (eight) hours as needed for muscle spasms. Patient not taking: Reported on 08/26/2014   30 tablet   0   . Hydrocodone-Chlorpheniramine 5-4 MG/5ML SOLN   Oral   Take 5 mLs by mouth 2 (two) times daily as needed (cough or congestion).   50 mL   0   . ketorolac (TORADOL) 10 MG tablet   Oral   Take 1 tablet (10 mg total) by mouth every 8 (eight) hours.   15 tablet   0   . loratadine (CLARITIN) 10 MG tablet   Oral   Take 10 mg by mouth daily.         . metFORMIN (GLUMETZA) 500 MG (MOD) 24 hr tablet   Oral   Take 500 mg by mouth 2 (two) times daily with a meal.         . SUMAtriptan-naproxen (TREXIMET) 85-500 MG per tablet   Oral   Take 1 tablet by mouth every 2 (two) hours as needed for migraine.         Marland Kitchen  topiramate (TOPAMAX) 100 MG tablet   Oral   Take 100 mg by mouth 2 (two) times daily.         . traMADol (ULTRAM) 50 MG tablet   Oral   Take 1 tablet (50 mg total) by mouth 2 (two) times daily.   10 tablet   0   . triamterene-hydrochlorothiazide (MAXZIDE-25) 37.5-25 MG per tablet   Oral   Take 1 tablet by mouth daily.         . verapamil (COVERA HS) 180 MG (CO) 24 hr tablet   Oral   Take 180 mg by mouth at bedtime.           Allergies:  Review of patient's allergies indicates no known allergies.  Family History: No family history on file.  Social History: Social History  Substance Use Topics  . Smoking status: Never Smoker   . Smokeless tobacco: Current User    Types: Snuff  . Alcohol Use: Yes     Comment: occasionally     Review of Systems:   10 point review of systems was performed and was otherwise negative:  Constitutional: No fever Eyes: No visual disturbances ENT: No sore throat, ear  pain Cardiac: No chest pain Respiratory: No shortness of breath, wheezing, or stridor Abdomen: No abdominal pain, no vomiting, No diarrhea Endocrine: No weight loss, No night sweats Extremities: No peripheral edema, cyanosis Skin: No rashes, easy bruising Neurologic: No focal weakness, trouble with speech or swollowing Urologic: No dysuria, Hematuria, or urinary frequency   Physical Exam:  ED Triage Vitals  Enc Vitals Group     BP 03/20/15 1022 127/80 mmHg     Pulse Rate 03/20/15 1022 91     Resp 03/20/15 1022 18     Temp 03/20/15 1022 98.9 F (37.2 C)     Temp Source 03/20/15 1022 Oral     SpO2 03/20/15 1022 100 %     Weight 03/20/15 1013 245 lb (111.131 kg)     Height 03/20/15 1013 5\' 6"  (1.676 m)     Head Cir --      Peak Flow --      Pain Score 03/20/15 1013 9     Pain Loc --      Pain Edu? --      Excl. in GC? --     General: Awake , Alert , and Oriented times 3; GCS 15 Head: Normal cephalic , atraumatic Eyes: Pupils equal , round, reactive to light Nose/Throat: No nasal drainage, patent upper airway without erythema or exudate.  Neck: Supple, Full range of motion, No anterior adenopathy or palpable thyroid masses Lungs: Clear to ascultation without wheezes , rhonchi, or rales Heart: Regular rate, regular rhythm without murmurs , gallops , or rubs Abdomen: Soft, non tender without rebound, guarding , or rigidity; bowel sounds positive and symmetric in all 4 quadrants. No organomegaly .        Extremities: Patient has tenderness over her lateral epicondyles of the left elbow with no swelling erythema or ecchymosis noted. She has full range of motion of the joint. She also has discomfort in the left wrist and also reproducible in the right upper extremity at her elbow. Patient has reproducible pain up to the level of her shoulder with no focal deficits or swelling. Neurologic: normal ambulation, Motor symmetric without deficits, sensory intact Skin: warm, dry, no  rashes  Patient has pain across the anterior chest wall. Labs:   All laboratory work was reviewed including  any pertinent negatives or positives listed below:  Labs Reviewed  BASIC METABOLIC PANEL - Abnormal; Notable for the following:    Potassium 3.3 (*)    Glucose, Bld 215 (*)    Creatinine, Ser 1.11 (*)    GFR calc non Af Amer 58 (*)    All other components within normal limits  CBC - Abnormal; Notable for the following:    RBC 5.32 (*)    All other components within normal limits  TROPONIN I   laboratory work showed no significant findings  EKG: ED ECG REPORT I, Jennye Moccasin, the attending physician, personally viewed and interpreted this ECG.  Date: 03/20/2015 EKG Time 10:15 Rate: 89 Rhythm: normal sinus rhythm QRS Axis: normal Intervals: normal ST/T Wave abnormalities: normal Conduction Disutrbances: none Narrative Interpretation: unremarkableCLINICAL DATA: LEFT anterior chest pain down LEFT arm off and on for 4 days, difficulty taking a deep breath, hypertension, diabetes mellitus  EXAM: CHEST 2 VIEW  COMPARISON: 08/26/2014  FINDINGS: Normal heart size, mediastinal contours, and pulmonary vascularity.  Lungs clear.  No pneumothorax.  Bones unremarkable.  IMPRESSION: Normal exam.  No acute ischemic changes   Radiology:      I personally reviewed the radiologic studies    ED Course:  The patient's stay here as far as uneventful and I felt we could try some IV Toradol along with by mouth Norco. Review the records shows some similar presentations of this diffuse musculoskeletal type of discomfort. This may be some form of myofascial syndrome or some diffuse arthritides. Patient may have lupus and require further outpatient assessment and laboratory work. She was encouraged to contact an oral clinic for outpatient management and form of her visit here to the emergency department. She will be again given short-term temporary relief with  prescription pain medication. I felt her chest pain was unlikely to be from a life-threatening source such as ischemia, true pleurisy, pneumonia, etc. Also felt was unlikely that her joint pain was from a septic joint, etc.  Assessment:  Diffuse musculoskeletal pain   Final Clinical Impression: Diffuse musculoskeletal pain* Final diagnoses:  None     Plan:  Outpatient management Patient was advised to return immediately if condition worsens. Patient was advised to follow up with their primary care physician or other specialized physicians involved in their outpatient care             Jennye Moccasin, MD 03/20/15 1155

## 2015-03-20 NOTE — ED Notes (Signed)
Pt alert and oriented X4, active, cooperative, pt in NAD. RR even and unlabored, color WNL.  Pt informed to return if any life threatening symptoms occur.   

## 2015-03-20 NOTE — ED Notes (Signed)
Bilateral elbow pain x3-4 days , right and left chest wall pain with SHOB x3-4 days , occasional left leg tingling/ numbness,  Headache

## 2015-06-14 ENCOUNTER — Encounter: Payer: Self-pay | Admitting: Emergency Medicine

## 2015-06-14 ENCOUNTER — Emergency Department
Admission: EM | Admit: 2015-06-14 | Discharge: 2015-06-14 | Disposition: A | Discharge status: Home / Self Care | Payer: PRIVATE HEALTH INSURANCE | Attending: Emergency Medicine | Admitting: Emergency Medicine

## 2015-06-14 DIAGNOSIS — M5442 Lumbago with sciatica, left side: Secondary | ICD-10-CM | POA: Insufficient documentation

## 2015-06-14 DIAGNOSIS — E119 Type 2 diabetes mellitus without complications: Secondary | ICD-10-CM | POA: Diagnosis not present

## 2015-06-14 DIAGNOSIS — I10 Essential (primary) hypertension: Secondary | ICD-10-CM | POA: Insufficient documentation

## 2015-06-14 DIAGNOSIS — M5432 Sciatica, left side: Secondary | ICD-10-CM

## 2015-06-14 DIAGNOSIS — M545 Low back pain: Secondary | ICD-10-CM | POA: Diagnosis present

## 2015-06-14 MED ORDER — BACLOFEN 10 MG PO TABS: 10.0000 mg | ORAL_TABLET | Freq: Three times a day (TID) | ORAL | Status: DC

## 2015-06-14 MED ORDER — OXYCODONE-ACETAMINOPHEN 5-325 MG PO TABS: 1.0000 | ORAL_TABLET | Freq: Four times a day (QID) | ORAL | Status: DC | PRN

## 2015-06-14 MED ORDER — KETOROLAC TROMETHAMINE 60 MG/2ML IM SOLN
30.0000 mg | Freq: Once | INTRAMUSCULAR | Status: AC
  Administered 2015-06-14: 30 mg via INTRAMUSCULAR
  Filled 2015-06-14: qty 2

## 2015-06-14 MED ORDER — DEXAMETHASONE SODIUM PHOSPHATE 10 MG/ML IJ SOLN
10.0000 mg | Freq: Once | INTRAMUSCULAR | Status: AC
  Administered 2015-06-14: 10 mg via INTRAMUSCULAR
  Filled 2015-06-14: qty 1

## 2015-06-14 NOTE — ED Provider Notes (Signed)
CSN: [id number]     Arrival date & time 06/14/15  1949 History   First MD Initiated Contact with Patient 06/14/15 2010     Chief Complaint  Patient presents with  . Back Pain     HPI   50 year old female who presents to the emergency department with lower back pain. She states that she typically has some lower back pain but it has never radiated into the leg. She reports a job that requires repetitive motion of bending and standing. No over-the-counter pain medications have given her any relief.  Past Medical History  Diagnosis Date  . Hypertension   . Diabetes mellitus without complication (HCC)   . Migraines    Past Surgical History  Procedure Laterality Date  . Cesarean section    . Knee surgery    . Ankle surgery     History reviewed. No pertinent family history. Social History  Substance Use Topics  . Smoking status: Never Smoker   . Smokeless tobacco: Current User    Types: Snuff  . Alcohol Use: Yes     Comment: occasionally   OB History    No data available     Review of Systems  Constitutional: Negative.   Musculoskeletal: Positive for back pain and gait problem. Negative for neck pain.  Skin: Negative.   Neurological: Positive for numbness. Negative for weakness.      Allergies  Review of patient's allergies indicates no known allergies.  Home Medications   Prior to Admission medications   Medication Sig Start Date End Date Taking? Authorizing Provider  albuterol (PROVENTIL HFA;VENTOLIN HFA) 108 (90 BASE) MCG/ACT inhaler Inhale 2 puffs into the lungs every 6 (six) hours as needed for wheezing or shortness of breath. 08/26/14   Myrna Blazeravid Matthew Schaevitz, MD  amitriptyline (ELAVIL) 50 MG tablet Take 50 mg by mouth at bedtime.    Historical Provider, MD  baclofen (LIORESAL) 10 MG tablet Take 1 tablet (10 mg total) by mouth 3 (three) times daily. 06/14/15   Chinita Pesterari B Abid Bolla, FNP  benzonatate (TESSALON PERLES) 100 MG capsule Take 1 capsule (100 mg total) by  mouth every 6 (six) hours as needed for cough. Patient not taking: Reported on 08/26/2014 08/19/14 08/19/15  Christella ScheuermannEmma Lawrence V, PA-C  ibuprofen (ADVIL,MOTRIN) 800 MG tablet Take 1 tablet (800 mg total) by mouth every 8 (eight) hours as needed. 03/20/15   Jennye MoccasinBrian S Quigley, MD  loratadine (CLARITIN) 10 MG tablet Take 10 mg by mouth daily.    Historical Provider, MD  metFORMIN (GLUMETZA) 500 MG (MOD) 24 hr tablet Take 500 mg by mouth 2 (two) times daily with a meal.    Historical Provider, MD  oxyCODONE-acetaminophen (ROXICET) 5-325 MG tablet Take 1 tablet by mouth every 6 (six) hours as needed. 06/14/15   Kelseigh Diver B Miaisabella Bacorn, FNP  SUMAtriptan-naproxen (TREXIMET) 85-500 MG per tablet Take 1 tablet by mouth every 2 (two) hours as needed for migraine.    Historical Provider, MD  topiramate (TOPAMAX) 100 MG tablet Take 100 mg by mouth 2 (two) times daily.    Historical Provider, MD  triamterene-hydrochlorothiazide (MAXZIDE-25) 37.5-25 MG per tablet Take 1 tablet by mouth daily.    Historical Provider, MD  verapamil (COVERA HS) 180 MG (CO) 24 hr tablet Take 180 mg by mouth at bedtime.    Historical Provider, MD   BP 138/106 mmHg  Pulse 101  Temp(Src) 98.5 F (36.9 C) (Oral)  Resp 18  Ht 5\' 6"  (1.676 m)  Wt 111.131 kg  BMI 39.56 kg/m2  SpO2 99% Physical Exam  Constitutional: She is oriented to person, place, and time. She appears well-developed and well-nourished. No distress.  HENT:  Head: Atraumatic.  Eyes: EOM are normal.  Neck: Normal range of motion.  Pulmonary/Chest: Effort normal.  Musculoskeletal:       Lumbar back: She exhibits decreased range of motion, tenderness and pain. She exhibits no bony tenderness.       Legs: Neurological: She is alert and oriented to person, place, and time.  Skin: Skin is warm and dry.  Psychiatric: Her behavior is normal. Thought content normal.  Nursing note and vitals reviewed.   ED Course  Procedures (including critical care time) Labs Review Labs Reviewed  - No data to display  Imaging Review No results found. I have personally reviewed and evaluated these images and lab results as part of my medical decision-making.   EKG Interpretation None      MDM   Final diagnoses:  Sciatica of left side    Patient was given 10 mg of Decadron IM. She will receive a prescription for baclofen and Roxicet. She was encouraged to schedule follow-up with her primary care provider or orthopedics. She was instructed to continue taking ibuprofen as well. She was advised to return to the emergency department for symptoms that change or worsen if she is unable schedule an appointment.    Chinita Pester, FNP 06/15/15 0021  Emily Filbert, MD 06/22/15 [phone]

## 2015-06-14 NOTE — ED Notes (Signed)
Pt arrived to the ED for complaints of lower back pain. Pt reports that she has back pain problems and now she is experiencing radiating pain to the leg. Pt is AOx4 in no apparent distress.

## 2015-06-14 NOTE — Discharge Instructions (Signed)
Follow up with your PCP for symptoms that are not improving over the next few days. Use ice instead of heat. Return to the ER for symptoms that change or worsen if you are unable to schedule an appointment.

## 2015-06-14 NOTE — ED Notes (Signed)
Pt in via triage w/ complaints of worsening lower back pain x 4 days; pt reports working in a [employer] and doing a lot of bending at work.  Pt with difficulty ambulating due to pain.  Pt A/Ox4, no immediate distress at this time.

## 2015-07-01 ENCOUNTER — Encounter: Payer: Self-pay | Admitting: Emergency Medicine

## 2015-07-01 ENCOUNTER — Emergency Department: Payer: PRIVATE HEALTH INSURANCE

## 2015-07-01 ENCOUNTER — Emergency Department
Admission: EM | Admit: 2015-07-01 | Discharge: 2015-07-01 | Disposition: A | Discharge status: Home / Self Care | Payer: PRIVATE HEALTH INSURANCE | Attending: Emergency Medicine | Admitting: Emergency Medicine

## 2015-07-01 DIAGNOSIS — R0602 Shortness of breath: Secondary | ICD-10-CM | POA: Insufficient documentation

## 2015-07-01 DIAGNOSIS — Z7984 Long term (current) use of oral hypoglycemic drugs: Secondary | ICD-10-CM | POA: Insufficient documentation

## 2015-07-01 DIAGNOSIS — Z79899 Other long term (current) drug therapy: Secondary | ICD-10-CM | POA: Diagnosis not present

## 2015-07-01 DIAGNOSIS — I10 Essential (primary) hypertension: Secondary | ICD-10-CM | POA: Diagnosis not present

## 2015-07-01 DIAGNOSIS — R079 Chest pain, unspecified: Secondary | ICD-10-CM | POA: Diagnosis not present

## 2015-07-01 DIAGNOSIS — F17290 Nicotine dependence, other tobacco product, uncomplicated: Secondary | ICD-10-CM | POA: Diagnosis not present

## 2015-07-01 DIAGNOSIS — E119 Type 2 diabetes mellitus without complications: Secondary | ICD-10-CM | POA: Insufficient documentation

## 2015-07-01 LAB — BASIC METABOLIC PANEL
Anion gap: 12 (ref 5–15)
BUN: 21 mg/dL — ABNORMAL HIGH (ref 6–20)
CHLORIDE: 102 mmol/L (ref 101–111)
CO2: 23 mmol/L (ref 22–32)
Calcium: 9.4 mg/dL (ref 8.9–10.3)
Creatinine, Ser: 1.39 mg/dL — ABNORMAL HIGH (ref 0.44–1.00)
GFR calc non Af Amer: 44 mL/min — ABNORMAL LOW (ref >60)
GFR, EST AFRICAN AMERICAN: 51 mL/min — AB (ref >60)
Glucose, Bld: 195 mg/dL — ABNORMAL HIGH (ref 65–99)
POTASSIUM: 3.4 mmol/L — AB (ref 3.5–5.1)
SODIUM: 137 mmol/L (ref 135–145)

## 2015-07-01 LAB — CBC
HEMATOCRIT: 40.6 % (ref 35.0–47.0)
HEMOGLOBIN: 13.3 g/dL (ref 12.0–16.0)
MCH: 26.6 pg (ref 26.0–34.0)
MCHC: 32.7 g/dL (ref 32.0–36.0)
MCV: 81.2 fL (ref 80.0–100.0)
Platelets: 301 10*3/uL (ref 150–440)
RBC: 4.99 MIL/uL (ref 3.80–5.20)
RDW: 14.2 % (ref 11.5–14.5)
WBC: 13.5 10*3/uL — AB (ref 3.6–11.0)

## 2015-07-01 LAB — TROPONIN I
Troponin I: <0.03 ng/mL (ref <0.031)
Troponin I: <0.03 ng/mL (ref <0.031)

## 2015-07-01 MED ORDER — OXYCODONE-ACETAMINOPHEN 5-325 MG PO TABS
ORAL_TABLET | ORAL | Status: AC
  Filled 2015-07-01: qty 1

## 2015-07-01 MED ORDER — OXYCODONE-ACETAMINOPHEN 5-325 MG PO TABS
1.0000 | ORAL_TABLET | Freq: Once | ORAL | Status: AC
  Administered 2015-07-01: 1 via ORAL

## 2015-07-01 MED ORDER — IOPAMIDOL (ISOVUE-370) INJECTION 76%
100.0000 mL | Freq: Once | INTRAVENOUS | Status: AC | PRN
  Administered 2015-07-01: 100 mL via INTRAVENOUS
  Filled 2015-07-01: qty 100

## 2015-07-01 MED ORDER — KETOROLAC TROMETHAMINE 30 MG/ML IJ SOLN
30.0000 mg | Freq: Once | INTRAMUSCULAR | Status: AC
  Administered 2015-07-01: 30 mg via INTRAVENOUS

## 2015-07-01 MED ORDER — KETOROLAC TROMETHAMINE 30 MG/ML IJ SOLN
INTRAMUSCULAR | Status: AC
  Administered 2015-07-01: 30 mg via INTRAVENOUS
  Filled 2015-07-01: qty 1

## 2015-07-01 MED ORDER — NAPROXEN 500 MG PO TABS: 500.0000 mg | ORAL_TABLET | Freq: Two times a day (BID) | ORAL | Status: DC

## 2015-07-01 NOTE — ED Notes (Signed)
Pt. States chest pain and shortness of breath for the past 2 days.  Pt. States chest pain radiates down rt. Arm.

## 2015-07-01 NOTE — ED Provider Notes (Signed)
[facility] Emergency Department Provider Note  ____________________________________________    I have reviewed the triage vital signs and the nursing notes.   HISTORY  Chief Complaint Chest Pain and Shortness of Breath    HPI [name] is a 50 y.o. female who presents with complaints of chest pain. She reports she has been having an aching chest pain in the center of her chest for 2 days constantly. She reports is worse when she takes a deep breath. She reports is worse when she puts pressure on the area. No fevers or chills. No nausea or vomiting. She denies diaphoresis to me. No recent travel. No calf pain or swelling. She has a history of diabetes and high blood pressure.  Past Medical History  Diagnosis Date  . Hypertension   . Diabetes mellitus without complication (HCC)   . Migraines     There are no active problems to display for this patient.   Past Surgical History  Procedure Laterality Date  . Cesarean section    . Knee surgery    . Ankle surgery      Current Outpatient Rx  Name  Route  Sig  Dispense  Refill  . albuterol (PROVENTIL HFA;VENTOLIN HFA) 108 (90 BASE) MCG/ACT inhaler   Inhalation   Inhale 2 puffs into the lungs every 6 (six) hours as needed for wheezing or shortness of breath.   1 Inhaler   2   . amitriptyline (ELAVIL) 50 MG tablet   Oral   Take 50 mg by mouth at bedtime.         . baclofen (LIORESAL) 10 MG tablet   Oral   Take 1 tablet (10 mg total) by mouth 3 (three) times daily.   30 tablet   0   . benzonatate (TESSALON PERLES) 100 MG capsule   Oral   Take 1 capsule (100 mg total) by mouth every 6 (six) hours as needed for cough. Patient not taking: Reported on 08/26/2014   30 capsule   0   . ibuprofen (ADVIL,MOTRIN) 800 MG tablet   Oral   Take 1 tablet (800 mg total) by mouth every 8 (eight) hours as needed.   30 tablet   0   . loratadine (CLARITIN) 10 MG tablet   Oral   Take 10 mg by mouth  daily.         . metFORMIN (GLUMETZA) 500 MG (MOD) 24 hr tablet   Oral   Take 500 mg by mouth 2 (two) times daily with a meal.         . oxyCODONE-acetaminophen (ROXICET) 5-325 MG tablet   Oral   Take 1 tablet by mouth every 6 (six) hours as needed.   9 tablet   0   . SUMAtriptan-naproxen (TREXIMET) 85-500 MG per tablet   Oral   Take 1 tablet by mouth every 2 (two) hours as needed for migraine.         . topiramate (TOPAMAX) 100 MG tablet   Oral   Take 100 mg by mouth 2 (two) times daily.         Marland Kitchen. triamterene-hydrochlorothiazide (MAXZIDE-25) 37.5-25 MG per tablet   Oral   Take 1 tablet by mouth daily.         . verapamil (COVERA HS) 180 MG (CO) 24 hr tablet   Oral   Take 180 mg by mouth at bedtime.           Allergies Review of patient's allergies indicates  no known allergies.  No family history on file.  Social History Social History  Substance Use Topics  . Smoking status: Never Smoker   . Smokeless tobacco: Current User    Types: Snuff  . Alcohol Use: Yes     Comment: occasionally    Review of Systems  Constitutional: Negative for fever. Eyes: Negative for change in vision ENT: Negative for sore throat Cardiovascular: As above Respiratory: As above Gastrointestinal: Negative for abdominal pain Genitourinary: Negative for dysuria. Musculoskeletal: Negative for back pain. Skin: Negative for rash. Neurological: Negative for focal weakness Psychiatric: Mild anxiety    ____________________________________________   PHYSICAL EXAM:  VITAL SIGNS: ED Triage Vitals  Enc Vitals Group     BP 07/01/15 1728 128/89 mmHg     Pulse Rate 07/01/15 1728 83     Resp 07/01/15 1728 18     Temp 07/01/15 1728 98.5 F (36.9 C)     Temp Source 07/01/15 1728 Oral     SpO2 07/01/15 1728 99 %     Weight 07/01/15 1728 249 lb (112.946 kg)     Height 07/01/15 1728  (1.676 m)     Head Cir --      Peak Flow --      Pain Score 07/01/15 1731 10      Pain Loc --      Pain Edu? --      Excl. in GC? --      Constitutional: Alert and oriented. Well appearing and in no distress.  Eyes: Conjunctivae are normal. No erythema or injection ENT   Head: Normocephalic and atraumatic.   Mouth/Throat: Mucous membranes are moist. Cardiovascular: Normal rate, regular rhythm. Normal and symmetric distal pulses are present in the upper extremities Respiratory: Normal respiratory effort without tachypnea nor retractions. Breath sounds are clear and equal bilaterally.  Gastrointestinal: Soft and non-tender in all quadrants. No distention. There is no CVA tenderness. Genitourinary: deferred Musculoskeletal: Nontender with normal range of motion in all extremities. No lower extremity tenderness nor edema. Neurologic:  Normal speech and language. No gross focal neurologic deficits are appreciated. Skin:  Skin is warm, dry and intact. No rash noted. Psychiatric: Mood and affect are normal. Patient exhibits appropriate insight and judgment.  ____________________________________________    LABS (pertinent positives/negatives)  Labs Reviewed  BASIC METABOLIC PANEL - Abnormal; Notable for the following:    Potassium 3.4 (*)    Glucose, Bld 195 (*)    BUN 21 (*)    Creatinine, Ser 1.39 (*)    GFR calc non Af Amer 44 (*)    GFR calc Af Amer 51 (*)    All other components within normal limits  CBC - Abnormal; Notable for the following:    WBC 13.5 (*)    All other components within normal limits  TROPONIN I  TROPONIN I    ____________________________________________   EKG  ED ECG REPORT I, Jene Every, the attending physician, personally viewed and interpreted this ECG.  Date: 07/01/2015 EKG Time: 5:29 PM Rate: 85 Rhythm: normal sinus rhythm QRS Axis: normal Intervals: normal ST/T Wave abnormalities: Nonspecific T-wave abnormalities Conduction Disturbances: none    ____________________________________________     RADIOLOGY  Chest x-ray unremarkable  CT angio of the chest shows no PE ____________________________________________   PROCEDURES  Procedure(s) performed: none  Critical Care performed: none  ____________________________________________   INITIAL IMPRESSION / ASSESSMENT AND PLAN / ED COURSE  Pertinent labs & imaging results that were available during my care of the  patient were reviewed by me and considered in my medical decision making (see chart for details).  Patient persist with chest pain. She is quite tender around the sternum. And this appears to  reproduce her pain exactly. Given her risk factors labs EKG and chest x-ray ordered.  Chest x-ray normal but patient complains of pleurisy so I will ordered a CT of her chest.  ----------------------------------------- 10:01 PM on 07/01/2015 -----------------------------------------  CT is unremarkable. Troponin is negative 2. Patient continues to demonstrate tenderness around the sternum. I suspect muscular skeletal chest pain. We will give IV Toradol and reevaluate   ----------------------------------------- 10:33 PM on 07/01/2015 -----------------------------------------  Patient feels significant better after Toradol. We will give her by mouth medication as she cannot get the pharmacy now because of the hour. She is anxious to go home and I feel it is appropriate given the normal CT scan and normal troponin 2. Not consistent with ACS, dissection, PE. Patient will follow-up with PCP ____________________________________________   FINAL CLINICAL IMPRESSION(S) / ED DIAGNOSES  Final diagnoses:  Chest pain, unspecified chest pain type          Jene Every, MD 07/01/15 2234

## 2015-07-01 NOTE — Discharge Instructions (Signed)

## 2015-07-01 NOTE — ED Notes (Signed)
Patient transported to CT 

## 2015-07-15 ENCOUNTER — Encounter: Payer: Self-pay | Admitting: Medical Oncology

## 2015-07-15 ENCOUNTER — Emergency Department: Payer: PRIVATE HEALTH INSURANCE

## 2015-07-15 ENCOUNTER — Emergency Department
Admission: EM | Admit: 2015-07-15 | Discharge: 2015-07-15 | Disposition: A | Discharge status: Home / Self Care | Payer: PRIVATE HEALTH INSURANCE | Attending: Emergency Medicine | Admitting: Emergency Medicine

## 2015-07-15 DIAGNOSIS — E119 Type 2 diabetes mellitus without complications: Secondary | ICD-10-CM | POA: Diagnosis not present

## 2015-07-15 DIAGNOSIS — R0789 Other chest pain: Secondary | ICD-10-CM | POA: Diagnosis not present

## 2015-07-15 DIAGNOSIS — R519 Headache, unspecified: Secondary | ICD-10-CM

## 2015-07-15 DIAGNOSIS — I10 Essential (primary) hypertension: Secondary | ICD-10-CM | POA: Insufficient documentation

## 2015-07-15 DIAGNOSIS — R079 Chest pain, unspecified: Secondary | ICD-10-CM

## 2015-07-15 DIAGNOSIS — Z791 Long term (current) use of non-steroidal anti-inflammatories (NSAID): Secondary | ICD-10-CM | POA: Diagnosis not present

## 2015-07-15 DIAGNOSIS — Z7984 Long term (current) use of oral hypoglycemic drugs: Secondary | ICD-10-CM | POA: Insufficient documentation

## 2015-07-15 DIAGNOSIS — F17290 Nicotine dependence, other tobacco product, uncomplicated: Secondary | ICD-10-CM | POA: Diagnosis not present

## 2015-07-15 DIAGNOSIS — Z79899 Other long term (current) drug therapy: Secondary | ICD-10-CM | POA: Diagnosis not present

## 2015-07-15 DIAGNOSIS — R51 Headache: Secondary | ICD-10-CM | POA: Diagnosis not present

## 2015-07-15 LAB — CBC
HCT: 40.8 % (ref 35.0–47.0)
Hemoglobin: 13.3 g/dL (ref 12.0–16.0)
MCH: 26.4 pg (ref 26.0–34.0)
MCHC: 32.6 g/dL (ref 32.0–36.0)
MCV: 81 fL (ref 80.0–100.0)
PLATELETS: 276 10*3/uL (ref 150–440)
RBC: 5.04 MIL/uL (ref 3.80–5.20)
RDW: 13.8 % (ref 11.5–14.5)
WBC: 7.8 10*3/uL (ref 3.6–11.0)

## 2015-07-15 LAB — LIPASE, BLOOD: Lipase: 21 U/L (ref 11–51)

## 2015-07-15 LAB — COMPREHENSIVE METABOLIC PANEL
ALBUMIN: 3.6 g/dL (ref 3.5–5.0)
ALT: 20 U/L (ref 14–54)
ANION GAP: 8 (ref 5–15)
AST: 17 U/L (ref 15–41)
Alkaline Phosphatase: 74 U/L (ref 38–126)
BUN: 19 mg/dL (ref 6–20)
CHLORIDE: 105 mmol/L (ref 101–111)
CO2: 25 mmol/L (ref 22–32)
Calcium: 9 mg/dL (ref 8.9–10.3)
Creatinine, Ser: 1.14 mg/dL — ABNORMAL HIGH (ref 0.44–1.00)
GFR calc Af Amer: >60 mL/min (ref >60)
GFR calc non Af Amer: 56 mL/min — ABNORMAL LOW (ref >60)
GLUCOSE: 188 mg/dL — AB (ref 65–99)
POTASSIUM: 3.2 mmol/L — AB (ref 3.5–5.1)
SODIUM: 138 mmol/L (ref 135–145)
TOTAL PROTEIN: 6.6 g/dL (ref 6.5–8.1)
Total Bilirubin: 0.5 mg/dL (ref 0.3–1.2)

## 2015-07-15 LAB — TROPONIN I: Troponin I: <0.03 ng/mL (ref <0.031)

## 2015-07-15 MED ORDER — GI COCKTAIL ~~LOC~~
30.0000 mL | Freq: Once | ORAL | Status: AC
  Administered 2015-07-15: 30 mL via ORAL
  Filled 2015-07-15: qty 30

## 2015-07-15 MED ORDER — HYDROCODONE-ACETAMINOPHEN 5-325 MG PO TABS: 1.0000 | ORAL_TABLET | ORAL | Status: DC | PRN

## 2015-07-15 NOTE — ED Notes (Signed)
Pt reports for over a week she has been having intermittent chest pain to left side of chest that radiates into her back, pt also reports she has headache and dizziness.

## 2015-07-15 NOTE — Discharge Instructions (Signed)
You have been seen in the emergency department today for chest pain and headache. Your workup has shown normal results. As we discussed please follow-up with your primary care physician in the next 1-2 days for recheck. Return to the emergency department for any further chest pain, trouble breathing, or any other symptom personally concerning to yourself. Please call the number provided for cardiology to arrange a stress test in the near future.   Nonspecific Chest Pain It is often hard to find the cause of chest pain. There is always a chance that your pain could be related to something serious, such as a heart attack or a blood clot in your lungs. Chest pain can also be caused by conditions that are not life-threatening. If you have chest pain, it is very important to follow up with your doctor.  HOME CARE  If you were prescribed an antibiotic medicine, finish it all even if you start to feel better.  Avoid any activities that cause chest pain.  Do not use any tobacco products, including cigarettes, chewing tobacco, or electronic cigarettes. If you need help quitting, ask your doctor.  Do not drink alcohol.  Take medicines only as told by your doctor.  Keep all follow-up visits as told by your doctor. This is important. This includes any further testing if your chest pain does not go away.  Your doctor may tell you to keep your head raised (elevated) while you sleep.  Make lifestyle changes as told by your doctor. These may include:  Getting regular exercise. Ask your doctor to suggest some activities that are safe for you.  Eating a heart-healthy diet. Your doctor or a diet specialist (dietitian) can help you to learn healthy eating options.  Maintaining a healthy weight.  Managing diabetes, if necessary.  Reducing stress. GET HELP IF:  Your chest pain does not go away, even after treatment.  You have a rash with blisters on your chest.  You have a fever. GET HELP RIGHT  AWAY IF:  Your chest pain is worse.  You have an increasing cough, or you cough up blood.  You have severe belly (abdominal) pain.  You feel extremely weak.  You pass out (faint).  You have chills.  You have sudden, unexplained chest discomfort.  You have sudden, unexplained discomfort in your arms, back, neck, or jaw.  You have shortness of breath at any time.  You suddenly start to sweat, or your skin gets clammy.  You feel nauseous.  You vomit.  You suddenly feel light-headed or dizzy.  Your heart begins to beat quickly, or it feels like it is skipping beats. These symptoms may be an emergency. Do not wait to see if the symptoms will go away. Get medical help right away. Call your local emergency services (911 in the [place] Do not drive yourself to the hospital.   This information is not intended to replace advice given to you by your health care provider. Make sure you discuss any questions you have with your health care provider.   Document Released: 08/08/2007 Document Revised: 03/12/2014 Document Reviewed: 09/25/2013 Elsevier Interactive Patient Education [employer]2016 Elsevier Inc.

## 2015-07-15 NOTE — ED Provider Notes (Signed)
[facility] Emergency Department Provider Note  Time seen: 9:30 AM  I have reviewed the triage vital signs and the nursing notes.   HISTORY  Chief Complaint Chest Pain; Shortness of Breath; and Dizziness    HPI Michele [name] is a 50 y.o. female with a past medical history of hypertension, diabetes, migraine headaches who presents to the emergency department with chest discomfort and a headache. According to the patient for the past 2 weeks she's had an intermittent chest pain/burning sensation to the center of her chest worse with deep inspiration or certain movements. Patient was seen here for the same on 07/01/15 had a negative workup including CT angiography at that time. Patient states since going home the chest pain has remained. For the past several days she is also been experiencing a squeezing headache and intermittent dizziness. Patient's main complaint continues to be the chest pain which she describes as a burning sensation to the center of her chest. Denies reflux. Denies leg pain or swelling.     Past Medical History  Diagnosis Date  . Hypertension   . Diabetes mellitus without complication (HCC)   . Migraines     There are no active problems to display for this patient.   Past Surgical History  Procedure Laterality Date  . Cesarean section    . Knee surgery    . Ankle surgery      Current Outpatient Rx  Name  Route  Sig  Dispense  Refill  . albuterol (PROVENTIL HFA;VENTOLIN HFA) 108 (90 BASE) MCG/ACT inhaler   Inhalation   Inhale 2 puffs into the lungs every 6 (six) hours as needed for wheezing or shortness of breath.   1 Inhaler   2   . amitriptyline (ELAVIL) 50 MG tablet   Oral   Take 50 mg by mouth at bedtime.         . baclofen (LIORESAL) 10 MG tablet   Oral   Take 1 tablet (10 mg total) by mouth 3 (three) times daily.   30 tablet   0   . benzonatate (TESSALON PERLES) 100 MG capsule   Oral   Take 1 capsule (100 mg  total) by mouth every 6 (six) hours as needed for cough. Patient not taking: Reported on 08/26/2014   30 capsule   0   . loratadine (CLARITIN) 10 MG tablet   Oral   Take 10 mg by mouth daily.         . metFORMIN (GLUMETZA) 500 MG (MOD) 24 hr tablet   Oral   Take 500 mg by mouth 2 (two) times daily with a meal.         . naproxen (NAPROSYN) 500 MG tablet   Oral   Take 1 tablet (500 mg total) by mouth 2 (two) times daily with a meal.   20 tablet   2   . SUMAtriptan-naproxen (TREXIMET) 85-500 MG per tablet   Oral   Take 1 tablet by mouth every 2 (two) hours as needed for migraine.         . topiramate (TOPAMAX) 100 MG tablet   Oral   Take 100 mg by mouth 2 (two) times daily.         Marland Kitchen triamterene-hydrochlorothiazide (MAXZIDE-25) 37.5-25 MG per tablet   Oral   Take 1 tablet by mouth daily.         . verapamil (COVERA HS) 180 MG (CO) 24 hr tablet   Oral   Take 180  mg by mouth at bedtime.           Allergies Review of patient's allergies indicates no known allergies.  No family history on file.  Social History Social History  Substance Use Topics  . Smoking status: Never Smoker   . Smokeless tobacco: Current User    Types: Snuff  . Alcohol Use: Yes     Comment: occasionally    Review of Systems Constitutional: Negative for fever. Cardiovascular: Central chest pain/burning, worse with inspiration. Respiratory: Negative for shortness of breath. Gastrointestinal: Negative for abdominal pain Musculoskeletal: Negative for back pain. Neurological: Mild to moderate intermittent headache 10-point ROS otherwise negative.  ____________________________________________   PHYSICAL EXAM:  VITAL SIGNS: ED Triage Vitals  Enc Vitals Group     BP 07/15/15 0904 128/85 mmHg     Pulse Rate 07/15/15 0904 87     Resp 07/15/15 0904 20     Temp 07/15/15 0904 97.9 F (36.6 C)     Temp Source 07/15/15 0904 Oral     SpO2 07/15/15 0904 100 %     Weight 07/15/15  0904 256 lb (116.121 kg)     Height 07/15/15 0904  (1.676 m)     Head Cir --      Peak Flow --      Pain Score 07/15/15 0905 10     Pain Loc --      Pain Edu? --      Excl. in GC? --     Constitutional: Alert and oriented. Well appearing and in no distress. Eyes: Normal exam ENT   Head: Normocephalic and atraumatic   Mouth/Throat: Mucous membranes are moist. Cardiovascular: Normal rate, regular rhythm. No murmur Respiratory: Normal respiratory effort without tachypnea nor retractions. Breath sounds are clear. Significant sternal chest tenderness to palpation. Gastrointestinal: Soft and nontender. No distention.   Musculoskeletal: Nontender with normal range of motion in all extremities. No lower extremity tenderness or edema. Neurologic:  Normal speech and language. No gross focal neurologic deficits Skin:  Skin is warm, dry and intact.  Psychiatric: Mood and affect are normal. Speech and behavior are normal.   ____________________________________________    EKG  EKG reviewed and interpreted by myself shows normal sinus rhythm at 87 bpm, narrow QRS, normal axis, normal intervals, no ST changes. Overall normal EKG.  ____________________________________________    RADIOLOGY  No acute disease on chest x-ray  ____________________________________________   INITIAL IMPRESSION / ASSESSMENT AND PLAN / ED COURSE  Pertinent labs & imaging results that were available during my care of the patient were reviewed by me and considered in my medical decision making (see chart for details).  Patient presents with continued chest pain/burning sensation to the center of her chest worse with movement or inspiration. Patient's workup is largely reassuring on 07/01/15 including a negative CT angiography of the chest. Patient has significant tenderness to palpation of the chest indicating possible musculoskeletal discomfort. We will repeat labs today including troponin, obtain a chest  x-ray, dosage GI cocktail and monitor closely in the emergency department. As far as the patient's headache she states is intermittent, squeezing sensation which she describes as mild to moderate unlike a normal migraine headache. Denies any focal neurologic deficits.  Patient's labs are largely within normal limits. Negative troponin. Normal chest x-ray. At this time is not clear what is causing the patient's discomfort, however she is very tender to palpation possibly indicating a musculoskeletal cause. We'll discharge patient with a short course of pain medication and  follow-up with a cardiologist for further evaluation and possible stress test per patient agreeable to plan.  ____________________________________________   FINAL CLINICAL IMPRESSION(S) / ED DIAGNOSES  Headache Chest pain   Minna AntisKevin Mayfield Schoene, MD 07/15/15 1032

## 2015-09-20 ENCOUNTER — Encounter: Payer: Self-pay | Admitting: Emergency Medicine

## 2015-09-20 ENCOUNTER — Emergency Department
Admission: EM | Admit: 2015-09-20 | Discharge: 2015-09-20 | Disposition: A | Discharge status: Home / Self Care | Payer: No Typology Code available for payment source | Attending: Emergency Medicine | Admitting: Emergency Medicine

## 2015-09-20 DIAGNOSIS — I10 Essential (primary) hypertension: Secondary | ICD-10-CM | POA: Insufficient documentation

## 2015-09-20 DIAGNOSIS — M791 Myalgia, unspecified site: Secondary | ICD-10-CM

## 2015-09-20 DIAGNOSIS — F17290 Nicotine dependence, other tobacco product, uncomplicated: Secondary | ICD-10-CM | POA: Diagnosis not present

## 2015-09-20 DIAGNOSIS — E1165 Type 2 diabetes mellitus with hyperglycemia: Secondary | ICD-10-CM | POA: Diagnosis not present

## 2015-09-20 DIAGNOSIS — Z7984 Long term (current) use of oral hypoglycemic drugs: Secondary | ICD-10-CM | POA: Diagnosis not present

## 2015-09-20 DIAGNOSIS — R739 Hyperglycemia, unspecified: Secondary | ICD-10-CM

## 2015-09-20 LAB — COMPREHENSIVE METABOLIC PANEL
ALK PHOS: 68 U/L (ref 38–126)
ALT: 19 U/L (ref 14–54)
AST: 18 U/L (ref 15–41)
Albumin: 3.5 g/dL (ref 3.5–5.0)
Anion gap: 8 (ref 5–15)
BUN: 15 mg/dL (ref 6–20)
CALCIUM: 8.3 mg/dL — AB (ref 8.9–10.3)
CO2: 24 mmol/L (ref 22–32)
CREATININE: 0.93 mg/dL (ref 0.44–1.00)
Chloride: 105 mmol/L (ref 101–111)
Glucose, Bld: 230 mg/dL — ABNORMAL HIGH (ref 65–99)
Potassium: 3.6 mmol/L (ref 3.5–5.1)
SODIUM: 137 mmol/L (ref 135–145)
Total Bilirubin: 0.7 mg/dL (ref 0.3–1.2)
Total Protein: 6.4 g/dL — ABNORMAL LOW (ref 6.5–8.1)

## 2015-09-20 LAB — URINALYSIS COMPLETE WITH MICROSCOPIC (ARMC ONLY)
BILIRUBIN URINE: NEGATIVE
Bacteria, UA: NONE SEEN
Glucose, UA: >500 mg/dL — AB
Hgb urine dipstick: NEGATIVE
KETONES UR: NEGATIVE mg/dL
Leukocytes, UA: NEGATIVE
Nitrite: NEGATIVE
PH: 7 (ref 5.0–8.0)
Protein, ur: NEGATIVE mg/dL
RBC / HPF: NONE SEEN RBC/hpf (ref 0–5)
Specific Gravity, Urine: 1.014 (ref 1.005–1.030)

## 2015-09-20 LAB — CBC
HCT: 42.6 % (ref 35.0–47.0)
Hemoglobin: 14 g/dL (ref 12.0–16.0)
MCH: 27.1 pg (ref 26.0–34.0)
MCHC: 32.8 g/dL (ref 32.0–36.0)
MCV: 82.6 fL (ref 80.0–100.0)
PLATELETS: 261 10*3/uL (ref 150–440)
RBC: 5.15 MIL/uL (ref 3.80–5.20)
RDW: 13.7 % (ref 11.5–14.5)
WBC: 8.2 10*3/uL (ref 3.6–11.0)

## 2015-09-20 LAB — LIPASE, BLOOD: Lipase: 23 U/L (ref 11–51)

## 2015-09-20 LAB — GLUCOSE, CAPILLARY: Glucose-Capillary: 139 mg/dL — ABNORMAL HIGH (ref 65–99)

## 2015-09-20 LAB — PREGNANCY, URINE: Preg Test, Ur: NEGATIVE

## 2015-09-20 MED ORDER — GABAPENTIN 100 MG PO CAPS: 100.0000 mg | ORAL_CAPSULE | Freq: Three times a day (TID) | ORAL | Status: DC

## 2015-09-20 MED ORDER — SODIUM CHLORIDE 0.9 % IV BOLUS (SEPSIS)
1000.0000 mL | Freq: Once | INTRAVENOUS | Status: AC
  Administered 2015-09-20: 1000 mL via INTRAVENOUS

## 2015-09-20 MED ORDER — ONDANSETRON HCL 4 MG/2ML IJ SOLN
4.0000 mg | Freq: Once | INTRAMUSCULAR | Status: AC
  Administered 2015-09-20: 4 mg via INTRAVENOUS
  Filled 2015-09-20: qty 2

## 2015-09-20 MED ORDER — GABAPENTIN 300 MG PO CAPS
ORAL_CAPSULE | ORAL | Status: AC
  Administered 2015-09-20: 300 mg
  Filled 2015-09-20: qty 1

## 2015-09-20 MED ORDER — KETOROLAC TROMETHAMINE 30 MG/ML IJ SOLN
30.0000 mg | Freq: Once | INTRAMUSCULAR | Status: AC
  Administered 2015-09-20: 30 mg via INTRAVENOUS
  Filled 2015-09-20: qty 1

## 2015-09-20 NOTE — ED Notes (Signed)
Pt reports generalized body aches x2 days and 3 episodes of emesis last night. Pt denies any CP, SHOB.

## 2015-09-20 NOTE — Discharge Instructions (Signed)
Please seek medical attention for any high fevers, chest pain, shortness of breath, change in behavior, persistent vomiting, bloody stool or any other new or concerning symptoms. ° ° °Hyperglycemia °High blood sugar (hyperglycemia) means that the level of sugar in your blood is higher than it should be. Signs of high blood sugar include: °· Feeling thirsty. °· Frequent peeing (urinating). °· Feeling tired or sleepy. °· Dry mouth. °· Vision changes. °· Feeling weak. °· Feeling hungry but losing weight. °· Numbness and tingling in your hands or feet. °· Headache. °When you ignore these signs, your blood sugar may keep going up. These problems may get worse, and other problems may begin. °HOME CARE °· Check your blood sugars as told by your doctor. Write down the numbers with the date and time. °· Take the right amount of insulin or diabetes pills at the right time. Write down the dose with date and time. °· Refill your insulin or diabetes pills before running out. °· Watch what you eat. Follow your meal plan. °· Drink liquids without sugar, such as water. Check with your doctor if you have kidney or heart disease. °· Follow your doctor's orders for exercise. Exercise at the same time of day. °· Keep your doctor's appointments. °GET HELP RIGHT AWAY IF:  °· You have trouble thinking or are confused. °· You have fast breathing with fruity smelling breath. °· You pass out (faint). °· You have 2 to 3 days of high blood sugars and you do not know why. °· You have chest pain. °· You are feeling sick to your stomach (nauseous) or throwing up (vomiting). °· You have sudden vision changes. °MAKE SURE YOU:  °· Understand these instructions. °· Will watch your condition. °· Will get help right away if you are not doing well or get worse. °  °This information is not intended to replace advice given to you by your health care provider. Make sure you discuss any questions you have with your health care provider. °  °Document  Released: 12/17/2008 Document Revised: 03/12/2014 Document Reviewed: 10/26/2014 °Elsevier Interactive Patient Education ©2016 Elsevier Inc. ° °

## 2015-09-20 NOTE — ED Provider Notes (Signed)
[facility] Emergency Department Provider Note   ____________________________________________  Time seen: ~1440  I have reviewed the triage vital signs and the nursing notes.   HISTORY  Chief Complaint Generalized Body Aches and Emesis   History limited by: Not Limited   HPI [name] is a 50 y.o. female who presents to the emergency department today with generalized body pains. The patient states that her pain started 2 days ago. It is all of her body. She says the only part of her body that doesn't hurt or her eyes, nose and mouth. It is severe. It is worse with movement. She denies any associated fevers. She has had nausea and some vomiting. She has not no sign of blood in the vomiting. No diarrhea. Denies similar symptoms in the past.   Past Medical History  Diagnosis Date  . Hypertension   . Diabetes mellitus without complication (HCC)   . Migraines     There are no active problems to display for this patient.   Past Surgical History  Procedure Laterality Date  . Cesarean section    . Knee surgery    . Ankle surgery      Current Outpatient Rx  Name  Route  Sig  Dispense  Refill  . albuterol (PROVENTIL HFA;VENTOLIN HFA) 108 (90 BASE) MCG/ACT inhaler   Inhalation   Inhale 2 puffs into the lungs every 6 (six) hours as needed for wheezing or shortness of breath.   1 Inhaler   2   . amitriptyline (ELAVIL) 50 MG tablet   Oral   Take 50 mg by mouth at bedtime.         . baclofen (LIORESAL) 10 MG tablet   Oral   Take 1 tablet (10 mg total) by mouth 3 (three) times daily.   30 tablet   0   . HYDROcodone-acetaminophen (NORCO/VICODIN) 5-325 MG tablet   Oral   Take 1 tablet by mouth every 4 (four) hours as needed for moderate pain.   20 tablet   0   . loratadine (CLARITIN) 10 MG tablet   Oral   Take 10 mg by mouth daily.         . metFORMIN (GLUMETZA) 500 MG (MOD) 24 hr tablet   Oral   Take 500 mg by mouth 2 (two) times  daily with a meal.         . naproxen (NAPROSYN) 500 MG tablet   Oral   Take 1 tablet (500 mg total) by mouth 2 (two) times daily with a meal.   20 tablet   2   . SUMAtriptan-naproxen (TREXIMET) 85-500 MG per tablet   Oral   Take 1 tablet by mouth every 2 (two) hours as needed for migraine.         . topiramate (TOPAMAX) 100 MG tablet   Oral   Take 100 mg by mouth 2 (two) times daily.         Marland Kitchen. triamterene-hydrochlorothiazide (MAXZIDE-25) 37.5-25 MG per tablet   Oral   Take 1 tablet by mouth daily.         . verapamil (COVERA HS) 180 MG (CO) 24 hr tablet   Oral   Take 180 mg by mouth at bedtime.           Allergies Review of patient's allergies indicates no known allergies.  No family history on file.  Social History Social History  Substance Use Topics  . Smoking status: Never Smoker   .  Smokeless tobacco: Current User    Types: Snuff  . Alcohol Use: Yes     Comment: occasionally    Review of Systems  Constitutional: Negative for fever. Cardiovascular: Positive for chest pain. Respiratory: Negative for shortness of breath. Gastrointestinal: Positive for abdominal pain, nausea and vomiting. Genitourinary: Negative for dysuria. Musculoskeletal: Positive for back pain, positive for arm and leg pain. Skin: Negative for rash. Neurological: Negative for headaches, focal weakness or numbness.  10-point ROS otherwise negative.  ____________________________________________   PHYSICAL EXAM:  VITAL SIGNS: ED Triage Vitals  Enc Vitals Group     BP 09/20/15 1151 153/94 mmHg     Pulse Rate 09/20/15 1151 73     Resp 09/20/15 1151 16     Temp 09/20/15 1151 97.8 F (36.6 C)     Temp Source 09/20/15 1151 Oral     SpO2 09/20/15 1151 100 %     Weight 09/20/15 1151 240 lb (108.863 kg)     Height 09/20/15 1151  (1.676 m)     Head Cir --      Peak Flow --      Pain Score 09/20/15 1151 10   Constitutional: Alert and oriented. Well appearing and in no  distress. Eyes: Conjunctivae are normal. PERRL. Normal extraocular movements. ENT   Head: Normocephalic and atraumatic.   Nose: No congestion/rhinnorhea.   Mouth/Throat: Mucous membranes are moist.   Neck: No stridor. Hematological/Lymphatic/Immunilogical: No cervical lymphadenopathy. Cardiovascular: Normal rate, regular rhythm.  No murmurs, rubs, or gallops. Respiratory: Normal respiratory effort without tachypnea nor retractions. Breath sounds are clear and equal bilaterally. No wheezes/rales/rhonchi. Gastrointestinal: Soft and positive for tenderness diffusely.  Genitourinary: Deferred Musculoskeletal: Normal range of motion in all extremities. No joint effusions.  No lower extremity tenderness nor edema. Neurologic:  Normal speech and language. No gross focal neurologic deficits are appreciated.  Skin:  Skin is warm, dry and intact. Full body extremely tender to palpation over all of her skin with light touch. Psychiatric: Mood and affect are normal. Speech and behavior are normal. Patient exhibits appropriate insight and judgment.  ____________________________________________    LABS (pertinent positives/negatives)  Labs Reviewed  COMPREHENSIVE METABOLIC PANEL - Abnormal; Notable for the following:    Glucose, Bld 230 (*)    Calcium 8.3 (*)    Total Protein 6.4 (*)    All other components within normal limits  URINALYSIS COMPLETEWITH MICROSCOPIC ([facility] ONLY) - Abnormal; Notable for the following:    Color, Urine YELLOW (*)    APPearance CLEAR (*)    Glucose, UA >500 (*)    Squamous Epithelial / LPF 0-5 (*)    All other components within normal limits  LIPASE, BLOOD  CBC  PREGNANCY, URINE     ____________________________________________   EKG  None  ____________________________________________    RADIOLOGY  None  ____________________________________________   PROCEDURES  Procedure(s) performed: None  Critical Care performed:  No  ____________________________________________   INITIAL IMPRESSION / ASSESSMENT AND PLAN / ED COURSE  Pertinent labs & imaging results that were available during my care of the patient were reviewed by me and considered in my medical decision making (see chart for details).  Patient presents to the emergency department today because of concerns for diffuse pain. On exam she is extremely tender to palpation diffusely. Blood work shows a slightly elevated glucose. Slightly decreased calcium. Will give fluids, nausea medicine and Toradol.   I did discuss with the patient the low calcium level. I did encourage patient to take  Tums over the next week to try to elevate her calcium level. This might be playing a role the patient's discomfort. Did encourage primary care follow-up. ____________________________________________   FINAL CLINICAL IMPRESSION(S) / ED DIAGNOSES  Final diagnoses:  Hyperglycemia  Myalgia     Note: This dictation was prepared with Dragon dictation. Any transcriptional errors that result from this process are unintentional    Phineas Semen, MD 09/20/15 [id number]

## 2015-09-21 ENCOUNTER — Emergency Department
Admission: EM | Admit: 2015-09-21 | Discharge: 2015-09-22 | Disposition: A | Discharge status: Home / Self Care | Payer: No Typology Code available for payment source | Attending: Emergency Medicine | Admitting: Emergency Medicine

## 2015-09-21 ENCOUNTER — Encounter: Payer: Self-pay | Admitting: Emergency Medicine

## 2015-09-21 DIAGNOSIS — R51 Headache: Secondary | ICD-10-CM | POA: Diagnosis not present

## 2015-09-21 DIAGNOSIS — R531 Weakness: Secondary | ICD-10-CM | POA: Diagnosis present

## 2015-09-21 DIAGNOSIS — E119 Type 2 diabetes mellitus without complications: Secondary | ICD-10-CM | POA: Insufficient documentation

## 2015-09-21 DIAGNOSIS — R519 Headache, unspecified: Secondary | ICD-10-CM

## 2015-09-21 DIAGNOSIS — M791 Myalgia, unspecified site: Secondary | ICD-10-CM

## 2015-09-21 DIAGNOSIS — Z79899 Other long term (current) drug therapy: Secondary | ICD-10-CM | POA: Insufficient documentation

## 2015-09-21 DIAGNOSIS — I10 Essential (primary) hypertension: Secondary | ICD-10-CM | POA: Insufficient documentation

## 2015-09-21 LAB — CBC
HEMATOCRIT: 43.7 % (ref 35.0–47.0)
HEMOGLOBIN: 14.6 g/dL (ref 12.0–16.0)
MCH: 27.5 pg (ref 26.0–34.0)
MCHC: 33.5 g/dL (ref 32.0–36.0)
MCV: 82 fL (ref 80.0–100.0)
Platelets: 286 10*3/uL (ref 150–440)
RBC: 5.33 MIL/uL — AB (ref 3.80–5.20)
RDW: 13.7 % (ref 11.5–14.5)
WBC: 9.7 10*3/uL (ref 3.6–11.0)

## 2015-09-21 LAB — MAGNESIUM: Magnesium: 1.9 mg/dL (ref 1.7–2.4)

## 2015-09-21 LAB — BASIC METABOLIC PANEL
ANION GAP: 8 (ref 5–15)
BUN: 11 mg/dL (ref 6–20)
CHLORIDE: 107 mmol/L (ref 101–111)
CO2: 24 mmol/L (ref 22–32)
CREATININE: 0.99 mg/dL (ref 0.44–1.00)
Calcium: 8.8 mg/dL — ABNORMAL LOW (ref 8.9–10.3)
GFR calc non Af Amer: >60 mL/min (ref >60)
Glucose, Bld: 115 mg/dL — ABNORMAL HIGH (ref 65–99)
POTASSIUM: 3.5 mmol/L (ref 3.5–5.1)
SODIUM: 139 mmol/L (ref 135–145)

## 2015-09-21 LAB — GLUCOSE, CAPILLARY: Glucose-Capillary: 103 mg/dL — ABNORMAL HIGH (ref 65–99)

## 2015-09-21 MED ORDER — PROCHLORPERAZINE EDISYLATE 5 MG/ML IJ SOLN
10.0000 mg | Freq: Once | INTRAMUSCULAR | Status: AC
  Administered 2015-09-21: 10 mg via INTRAVENOUS
  Filled 2015-09-21: qty 2

## 2015-09-21 MED ORDER — KETOROLAC TROMETHAMINE 30 MG/ML IJ SOLN
30.0000 mg | Freq: Once | INTRAMUSCULAR | Status: AC
  Administered 2015-09-21: 30 mg via INTRAVENOUS
  Filled 2015-09-21: qty 1

## 2015-09-21 MED ORDER — SODIUM CHLORIDE 0.9 % IV BOLUS (SEPSIS)
1000.0000 mL | Freq: Once | INTRAVENOUS | Status: AC
  Administered 2015-09-21: 1000 mL via INTRAVENOUS

## 2015-09-21 NOTE — ED Notes (Signed)
Pt seen yesterday for same symptoms as well as in January. Pt reports she has not followed up with PCP this year.

## 2015-09-21 NOTE — ED Provider Notes (Signed)
[facility] Emergency Department Provider Note    ____________________________________________  Time seen: ~2045  I have reviewed the triage vital signs and the nursing notes.   HISTORY  Chief Complaint Generalized Body Aches and Weakness   History limited by: Not Limited   HPI Michele [name] is a 50 y.o. female  who presents to the emergency department today because of continued full body aches. I evaluated the patient yesterday the emergency department for the same symptoms. Patient states that the pain is continued or worse. In addition the patient states she now has a migraine. She has a history of migraines. She feels that the full body pain is likely causing her migraines to get worse. She states that she tried contacting her primary care doctor who is going to call her back.     Past Medical History  Diagnosis Date  . Hypertension   . Diabetes mellitus without complication (HCC)   . Migraines     There are no active problems to display for this patient.   Past Surgical History  Procedure Laterality Date  . Cesarean section    . Knee surgery    . Ankle surgery      Current Outpatient Rx  Name  Route  Sig  Dispense  Refill  . albuterol (PROVENTIL HFA;VENTOLIN HFA) 108 (90 BASE) MCG/ACT inhaler   Inhalation   Inhale 2 puffs into the lungs every 6 (six) hours as needed for wheezing or shortness of breath.   1 Inhaler   2   . amitriptyline (ELAVIL) 50 MG tablet   Oral   Take 50 mg by mouth at bedtime.         . baclofen (LIORESAL) 10 MG tablet   Oral   Take 1 tablet (10 mg total) by mouth 3 (three) times daily.   30 tablet   0   . gabapentin (NEURONTIN) 100 MG capsule   Oral   Take 1 capsule (100 mg total) by mouth 3 (three) times daily.   90 capsule   0   . HYDROcodone-acetaminophen (NORCO/VICODIN) 5-325 MG tablet   Oral   Take 1 tablet by mouth every 4 (four) hours as needed for moderate pain.   20 tablet   0   .  loratadine (CLARITIN) 10 MG tablet   Oral   Take 10 mg by mouth daily.         . metFORMIN (GLUMETZA) 500 MG (MOD) 24 hr tablet   Oral   Take 500 mg by mouth 2 (two) times daily with a meal.         . naproxen (NAPROSYN) 500 MG tablet   Oral   Take 1 tablet (500 mg total) by mouth 2 (two) times daily with a meal.   20 tablet   2   . SUMAtriptan-naproxen (TREXIMET) 85-500 MG per tablet   Oral   Take 1 tablet by mouth every 2 (two) hours as needed for migraine.         . topiramate (TOPAMAX) 100 MG tablet   Oral   Take 100 mg by mouth 2 (two) times daily.         Marland Kitchen triamterene-hydrochlorothiazide (MAXZIDE-25) 37.5-25 MG per tablet   Oral   Take 1 tablet by mouth daily.         . verapamil (COVERA HS) 180 MG (CO) 24 hr tablet   Oral   Take 180 mg by mouth at bedtime.  Allergies Review of patient's allergies indicates no known allergies.  History reviewed. No pertinent family history.  Social History Social History  Substance Use Topics  . Smoking status: Never Smoker   . Smokeless tobacco: Current User    Types: Snuff  . Alcohol Use: Yes     Comment: occasionally    Review of Systems  Constitutional: Negative for fever. Cardiovascular: Positive for chest pain. Respiratory: Negative for shortness of breath. Gastrointestinal: Positive for abdominal pain. Genitourinary: Negative for dysuria. Musculoskeletal: Positive for back pain. Skin: Negative for rash. Neurological: Positive for headache.  10-point ROS otherwise negative.  ____________________________________________   PHYSICAL EXAM:  VITAL SIGNS: ED Triage Vitals  Enc Vitals Group     BP --      Pulse Rate 09/21/15 2001 72     Resp 09/21/15 2001 20     Temp 09/21/15 2001 98.2 F (36.8 C)     Temp Source 09/21/15 2001 Oral     SpO2 09/21/15 2001 98 %     Weight 09/21/15 2001 240 lb (108.863 kg)     Height 09/21/15 2001 5\' 6"  (1.676 m)     Head Cir --      Peak Flow --       Pain Score 09/21/15 2003 10  \ Constitutional: Alert and oriented. Well appearing and in no distress. Eyes: Conjunctivae are normal. PERRL. Normal extraocular movements. ENT   Head: Normocephalic and atraumatic.   Nose: No congestion/rhinnorhea.   Mouth/Throat: Mucous membranes are moist.   Neck: No stridor. Hematological/Lymphatic/Immunilogical: No cervical lymphadenopathy. Cardiovascular: Normal rate, regular rhythm.  No murmurs, rubs, or gallops. Respiratory: Normal respiratory effort without tachypnea nor retractions. Breath sounds are clear and equal bilaterally. No wheezes/rales/rhonchi. Gastrointestinal: Soft and diffusely tender to palpation. Genitourinary: Deferred Musculoskeletal: Normal range of motion in all extremities. No joint effusions.  No lower extremity tenderness nor edema. Diffusely tender to palpation. Neurologic:  Normal speech and language. No gross focal neurologic deficits are appreciated.  Skin:  Skin is warm, dry and intact. No rash noted. Psychiatric: Mood and affect are normal. Speech and behavior are normal. Patient exhibits appropriate insight and judgment.  ____________________________________________    LABS (pertinent positives/negatives)  Labs Reviewed  BASIC METABOLIC PANEL - Abnormal; Notable for the following:    Glucose, Bld 115 (*)    Calcium 8.8 (*)    All other components within normal limits  CBC - Abnormal; Notable for the following:    RBC 5.33 (*)    All other components within normal limits  GLUCOSE, CAPILLARY - Abnormal; Notable for the following:    Glucose-Capillary 103 (*)    All other components within normal limits  MAGNESIUM  URINALYSIS COMPLETEWITH MICROSCOPIC ([facility] ONLY)  CBG MONITORING, ED     ____________________________________________   EKG  I, Phineas SemenGraydon Vyla Pint, attending physician, personally viewed and interpreted this EKG  EKG Time: 2005 Rate: 71 Rhythm: normal sinus rhythm Axis:  normal Intervals: qtc 406 QRS: narrow ST changes: no st elevation Impression: normal ekg   ____________________________________________    RADIOLOGY  None  ____________________________________________   PROCEDURES  Procedure(s) performed: None  Critical Care performed: No  ____________________________________________   INITIAL IMPRESSION / ASSESSMENT AND PLAN / ED COURSE  Pertinent labs & imaging results that were available during my care of the patient were reviewed by me and considered in my medical decision making (see chart for details).  Patient presented to the emergency department today with full body aches. I did evaluate the patient  has over the same symptoms. States blood work today so a slightly decreased calcium. Patient states she felt better after fluids and calcium.  ____________________________________________   FINAL CLINICAL IMPRESSION(S) / ED DIAGNOSES  Final diagnoses:  Myalgia  Headache, unspecified headache type     Note: This dictation was prepared with Dragon dictation. Any transcriptional errors that result from this process are unintentional    Phineas Semen, MD 09/22/15 0020

## 2015-09-21 NOTE — Progress Notes (Signed)
Pt cont to report pain "10/10" in her head and chest.  IVF bolus complete.  Pt still states she can not give urine specimen.  Dr Derrill KayGoodman updated on pt's status.

## 2015-09-21 NOTE — ED Notes (Signed)
Pt arrived to the ED accompanied by her family for complaints of generalized body aches, headache, weakness and SOB. Pt reports that she was seen here in this ED yesterday and was diagnosed with "high blood sugar." Pt is AOx4 in no apparent distress with a strong smell of tobacco products.

## 2015-09-22 MED ORDER — PROCHLORPERAZINE MALEATE 10 MG PO TABS: 10.0000 mg | ORAL_TABLET | Freq: Three times a day (TID) | ORAL | Status: DC | PRN

## 2015-09-22 NOTE — Discharge Instructions (Signed)
Please seek medical attention for any high fevers, chest pain, shortness of breath, change in behavior, persistent vomiting, bloody stool or any other new or concerning symptoms. ° ° °Migraine Headache °A migraine headache is very bad, throbbing pain on one or both sides of your head. Talk to your doctor about what things may bring on (trigger) your migraine headaches. °HOME CARE °· Only take medicines as told by your doctor. °· Lie down in a dark, quiet room when you have a migraine. °· Keep a journal to find out if certain things bring on migraine headaches. For example, write down: °¨ What you eat and drink. °¨ How much sleep you get. °¨ Any change to your diet or medicines. °· Lessen how much alcohol you drink. °· Quit smoking if you smoke. °· Get enough sleep. °· Lessen any stress in your life. °· Keep lights dim if bright lights bother you or make your migraines worse. °GET HELP RIGHT AWAY IF:  °· Your migraine becomes really bad. °· You have a fever. °· You have a stiff neck. °· You have trouble seeing. °· Your muscles are weak, or you lose muscle control. °· You lose your balance or have trouble walking. °· You feel like you will pass out (faint), or you pass out. °· You have really bad symptoms that are different than your first symptoms. °MAKE SURE YOU:  °· Understand these instructions. °· Will watch your condition. °· Will get help right away if you are not doing well or get worse. °  °This information is not intended to replace advice given to you by your health care provider. Make sure you discuss any questions you have with your health care provider. °  °Document Released: 11/29/2007 Document Revised: 05/14/2011 Document Reviewed: 10/27/2012 °Elsevier Interactive Patient Education ©2016 [employer] ° °

## 2015-12-14 ENCOUNTER — Other Ambulatory Visit: Payer: Self-pay | Admitting: Internal Medicine

## 2015-12-14 DIAGNOSIS — Z1231 Encounter for screening mammogram for malignant neoplasm of breast: Secondary | ICD-10-CM

## 2015-12-16 ENCOUNTER — Ambulatory Visit
Admission: RE | Admit: 2015-12-16 | Discharge: 2015-12-16 | Disposition: A | Discharge status: Home / Self Care | Payer: No Typology Code available for payment source | Source: Ambulatory Visit | Attending: Internal Medicine | Admitting: Internal Medicine

## 2015-12-16 DIAGNOSIS — Z1231 Encounter for screening mammogram for malignant neoplasm of breast: Secondary | ICD-10-CM | POA: Diagnosis present

## 2015-12-20 ENCOUNTER — Other Ambulatory Visit: Payer: Self-pay | Admitting: Orthopedic Surgery

## 2015-12-20 DIAGNOSIS — M25562 Pain in left knee: Secondary | ICD-10-CM

## 2015-12-25 ENCOUNTER — Encounter: Payer: Self-pay | Admitting: Emergency Medicine

## 2015-12-25 ENCOUNTER — Emergency Department
Admission: EM | Admit: 2015-12-25 | Discharge: 2015-12-25 | Disposition: A | Discharge status: Home / Self Care | Payer: No Typology Code available for payment source | Attending: Emergency Medicine | Admitting: Emergency Medicine

## 2015-12-25 DIAGNOSIS — M25561 Pain in right knee: Secondary | ICD-10-CM | POA: Diagnosis not present

## 2015-12-25 DIAGNOSIS — Z79899 Other long term (current) drug therapy: Secondary | ICD-10-CM | POA: Diagnosis not present

## 2015-12-25 DIAGNOSIS — Z7984 Long term (current) use of oral hypoglycemic drugs: Secondary | ICD-10-CM | POA: Diagnosis not present

## 2015-12-25 DIAGNOSIS — I10 Essential (primary) hypertension: Secondary | ICD-10-CM | POA: Diagnosis not present

## 2015-12-25 DIAGNOSIS — M25462 Effusion, left knee: Secondary | ICD-10-CM | POA: Insufficient documentation

## 2015-12-25 DIAGNOSIS — E119 Type 2 diabetes mellitus without complications: Secondary | ICD-10-CM | POA: Insufficient documentation

## 2015-12-25 DIAGNOSIS — F17290 Nicotine dependence, other tobacco product, uncomplicated: Secondary | ICD-10-CM | POA: Diagnosis not present

## 2015-12-25 DIAGNOSIS — M25562 Pain in left knee: Secondary | ICD-10-CM | POA: Diagnosis present

## 2015-12-25 MED ORDER — OXYCODONE HCL 5 MG PO TABS: 5.0000 mg | ORAL_TABLET | Freq: Every evening | ORAL | 0 refills | Status: DC | PRN

## 2015-12-25 NOTE — ED Notes (Addendum)
Had cortisone shot a few weeks ago states feels knee is worse. [facility].  States seen also at [facility] as well. Was told she needs an mri but hasn't followed up with ortho. Mri scheduled for 01/02/16.  No swelling or deformity noted

## 2015-12-25 NOTE — ED Notes (Signed)
Pt refused knee immobilizer - pt taken out to waiting area in w/c

## 2015-12-25 NOTE — ED Triage Notes (Signed)
Presents with left knee pain  States she had steroid shot in same knee on the 6th of this month  But has had increased pain since

## 2015-12-25 NOTE — ED Provider Notes (Signed)
[facility]-EMERGENCY DEPARTMENT Provider Note   CSN: [id number] Arrival date & time: 12/25/15  1302     History   Chief Complaint Chief Complaint  Patient presents with  . Knee Pain    HPI Michele [name] is a 50 y.o. female presents to the emergency department for evaluation of left knee pain. Patient has had left knee pain and swelling for several weeks. She was treated by orthopedics, had a cortisone injection which gave her minimal relief a few days. He has developed catching and clicking and locking throughout the left knee. Patient has severe pain at the end of the day from walking at her job. She is taking 1 Percocet tablet at nighttime. She is unable to miss work due to fear from being terminated. She will rest ice and elevate the knee and take NSAIDs to help with pain. She denies any fevers warmth or redness. Patient denies any trauma or injury since her last orthopedic appointment. She has an MRI scheduled for 8 days to inspect for meniscal tear.  HPI  Past Medical History:  Diagnosis Date  . Diabetes mellitus without complication (HCC)   . Hypertension   . Migraines     There are no active problems to display for this patient.   Past Surgical History:  Procedure Laterality Date  . ABDOMINAL HYSTERECTOMY    . ANKLE SURGERY    . CESAREAN SECTION    . KNEE SURGERY      OB History    No data available       Home Medications    Prior to Admission medications   Medication Sig Start Date End Date Taking? Authorizing Provider  albuterol (PROVENTIL HFA;VENTOLIN HFA) 108 (90 BASE) MCG/ACT inhaler Inhale 2 puffs into the lungs every 6 (six) hours as needed for wheezing or shortness of breath. 08/26/14   Myrna Blazer, MD  amitriptyline (ELAVIL) 50 MG tablet Take 50 mg by mouth at bedtime.    Historical Provider, MD  baclofen (LIORESAL) 10 MG tablet Take 1 tablet (10 mg total) by mouth 3 (three) times daily. 06/14/15   Chinita Pester, FNP  gabapentin  (NEURONTIN) 100 MG capsule Take 1 capsule (100 mg total) by mouth 3 (three) times daily. 09/20/15 09/19/16  Phineas Semen, MD  HYDROcodone-acetaminophen (NORCO/VICODIN) 5-325 MG tablet Take 1 tablet by mouth every 4 (four) hours as needed for moderate pain. 07/15/15   Minna Antis, MD  loratadine (CLARITIN) 10 MG tablet Take 10 mg by mouth daily.    Historical Provider, MD  metFORMIN (GLUMETZA) 500 MG (MOD) 24 hr tablet Take 500 mg by mouth 2 (two) times daily with a meal.    Historical Provider, MD  naproxen (NAPROSYN) 500 MG tablet Take 1 tablet (500 mg total) by mouth 2 (two) times daily with a meal. 07/01/15   Jene Every, MD  oxyCODONE (ROXICODONE) 5 MG immediate release tablet Take 1 tablet (5 mg total) by mouth at bedtime as needed. 12/25/15 12/24/16  Evon Slack, PA-C  prochlorperazine (COMPAZINE) 10 MG tablet Take 1 tablet (10 mg total) by mouth every 8 (eight) hours as needed (headache). 09/22/15   Phineas Semen, MD  SUMAtriptan-naproxen (TREXIMET) 85-500 MG per tablet Take 1 tablet by mouth every 2 (two) hours as needed for migraine.    Historical Provider, MD  topiramate (TOPAMAX) 100 MG tablet Take 100 mg by mouth 2 (two) times daily.    Historical Provider, MD  triamterene-hydrochlorothiazide (MAXZIDE-25) 37.5-25 MG per tablet Take 1 tablet by  mouth daily.    Historical Provider, MD  verapamil (COVERA HS) 180 MG (CO) 24 hr tablet Take 180 mg by mouth at bedtime.    Historical Provider, MD    Family History Family History  Problem Relation Age of Onset  . Breast cancer Neg Hx     Social History Social History  Substance Use Topics  . Smoking status: Never Smoker  . Smokeless tobacco: Current User    Types: Snuff  . Alcohol use Yes     Comment: occasionally     Allergies   Review of patient's allergies indicates no known allergies.   Review of Systems Review of Systems  Constitutional: Negative for chills and fever.  HENT: Negative for ear pain and sore  throat.   Eyes: Negative for pain and visual disturbance.  Respiratory: Negative for cough and shortness of breath.   Cardiovascular: Negative for chest pain and palpitations.  Gastrointestinal: Negative for abdominal pain and vomiting.  Genitourinary: Negative for dysuria and hematuria.  Musculoskeletal: Positive for gait problem and joint swelling. Negative for arthralgias and back pain.  Skin: Negative for color change and rash.  Neurological: Negative for seizures and syncope.  All other systems reviewed and are negative.    Physical Exam Updated Vital Signs BP (!) 142/97 (BP Location: Left Arm)   Pulse 80   Temp 98.3 F (36.8 C) (Oral)   Resp 20   Ht 5\' 6"  (1.676 m)   Wt 111.1 kg   SpO2 98%   BMI 39.54 kg/m   Physical Exam  Constitutional: She is oriented to person, place, and time. She appears well-developed and well-nourished. No distress.  HENT:  Head: Normocephalic and atraumatic.  Mouth/Throat: Oropharynx is clear and moist.  Eyes: EOM are normal. Pupils are equal, round, and reactive to light. Right eye exhibits no discharge. Left eye exhibits no discharge.  Neck: Normal range of motion. Neck supple.  Cardiovascular: Normal rate, regular rhythm and intact distal pulses.   Pulmonary/Chest: Effort normal and breath sounds normal. No respiratory distress. She exhibits no tenderness.  Abdominal: Soft. She exhibits no distension. There is no tenderness.  Musculoskeletal:  Examination of the left knee shows the patient has mild swelling and effusion. There is no warmth or erythema. Range of motion is 0-75. She is unable to flex the knee past 75. She is able to straight leg raise. No laxity with valgus or varus stress testing. Negative Homans sign. Full range of motion of the hip with internal and external rotation.  Neurological: She is alert and oriented to person, place, and time. She has normal reflexes.  Skin: Skin is warm and dry.  Psychiatric: She has a normal  mood and affect. Her behavior is normal. Thought content normal.   patient ambulates with no assisted devices mild antalgic gait.   ED Treatments / Results  Labs (all labs ordered are listed, but only abnormal results are displayed) Labs Reviewed - No data to display  EKG  EKG Interpretation None       Radiology No results found.  Procedures Procedures (including critical care time)  Medications Ordered in ED Medications - No data to display   Initial Impression / Assessment and Plan / ED Course  I have reviewed the triage vital signs and the nursing notes.  Pertinent labs & imaging results that were available during my care of the patient were reviewed by me and considered in my medical decision making (see chart for details).  Clinical Course  50 year old  female with continued left knee pain. X-rays several weeks ago orthopedic show no evidence of acute bony abnormality but tricompartmental arthritic changes. Cortisone injection gave her mild improvement. She is scheduled for MRI in 8 days. She is unable to take time off from work and has been working through the pain. She ices, elevates the knee at nighttime and also takes ibuprofen and Percocet at bedtime to help alleviate the pain and help her sleep. There are no signs of infections or tendon/ligament injuries. Recommended knee immobilizer, she has a walker at home. Offered work note, patient refused. She is given a prescription for pain to help at nighttime. Follow-up with orthopedics.  Final Clinical Impressions(s) / ED Diagnoses   Final diagnoses:  Acute pain of right knee  Effusion of left knee    New Prescriptions Discharge Medication List as of 12/25/2015  2:41 PM    START taking these medications   Details  oxyCODONE (ROXICODONE) 5 MG immediate release tablet Take 1 tablet (5 mg total) by mouth at bedtime as needed., Starting Sun 12/25/2015, Until Mon 12/24/2016, Print         Evon Slackhomas C Lanson Randle,  PA-C 12/25/15 1504    Myrna Blazeravid Matthew Schaevitz, MD 12/25/15 1600

## 2016-01-02 ENCOUNTER — Ambulatory Visit
Admission: RE | Admit: 2016-01-02 | Discharge: 2016-01-02 | Disposition: A | Discharge status: Home / Self Care | Payer: No Typology Code available for payment source | Source: Ambulatory Visit | Attending: Orthopedic Surgery | Admitting: Orthopedic Surgery

## 2016-01-02 DIAGNOSIS — X58XXXA Exposure to other specified factors, initial encounter: Secondary | ICD-10-CM | POA: Diagnosis not present

## 2016-01-02 DIAGNOSIS — S83242A Other tear of medial meniscus, current injury, left knee, initial encounter: Secondary | ICD-10-CM | POA: Diagnosis not present

## 2016-01-02 DIAGNOSIS — M25562 Pain in left knee: Secondary | ICD-10-CM | POA: Diagnosis present

## 2016-07-17 ENCOUNTER — Emergency Department
Admission: EM | Admit: 2016-07-17 | Discharge: 2016-07-17 | Disposition: A | Discharge status: Home / Self Care | Payer: PRIVATE HEALTH INSURANCE | Attending: Emergency Medicine | Admitting: Emergency Medicine

## 2016-07-17 ENCOUNTER — Encounter: Payer: Self-pay | Admitting: Emergency Medicine

## 2016-07-17 DIAGNOSIS — M1712 Unilateral primary osteoarthritis, left knee: Secondary | ICD-10-CM | POA: Insufficient documentation

## 2016-07-17 DIAGNOSIS — M19171 Post-traumatic osteoarthritis, right ankle and foot: Secondary | ICD-10-CM | POA: Insufficient documentation

## 2016-07-17 DIAGNOSIS — Z79899 Other long term (current) drug therapy: Secondary | ICD-10-CM | POA: Diagnosis not present

## 2016-07-17 DIAGNOSIS — E119 Type 2 diabetes mellitus without complications: Secondary | ICD-10-CM | POA: Insufficient documentation

## 2016-07-17 DIAGNOSIS — I10 Essential (primary) hypertension: Secondary | ICD-10-CM | POA: Insufficient documentation

## 2016-07-17 DIAGNOSIS — Z7984 Long term (current) use of oral hypoglycemic drugs: Secondary | ICD-10-CM | POA: Diagnosis not present

## 2016-07-17 DIAGNOSIS — F17290 Nicotine dependence, other tobacco product, uncomplicated: Secondary | ICD-10-CM | POA: Diagnosis not present

## 2016-07-17 DIAGNOSIS — M25571 Pain in right ankle and joints of right foot: Secondary | ICD-10-CM | POA: Diagnosis present

## 2016-07-17 MED ORDER — KNEE BRACE/HINGED BARS XL MISC: 1.0000 | Freq: Every day | 0 refills | Status: DC

## 2016-07-17 MED ORDER — OXYCODONE-ACETAMINOPHEN 5-325 MG PO TABS: 1.0000 | ORAL_TABLET | Freq: Three times a day (TID) | ORAL | 0 refills | Status: DC | PRN

## 2016-07-17 MED ORDER — MELOXICAM 15 MG PO TABS
15.0000 mg | ORAL_TABLET | Freq: Every day | ORAL | 0 refills | Status: DC
Start: 2016-07-17 — End: 2017-06-30

## 2016-07-17 NOTE — ED Notes (Signed)
Chronic knee and ankle pain. +2 distal pulses.

## 2016-07-17 NOTE — ED Provider Notes (Signed)
[facility] Emergency Department Provider Note ____________________________________________  Time seen: Approximately 6:27 PM  I have reviewed the triage vital signs and the nursing notes.   HISTORY  Chief Complaint Ankle Pain and Knee Pain    HPI Michele [name] is a 51 y.o. female who presents to the emergency department for evaluation of left knee and right ankle pain. Pain is chronic, but seems to be worse over the past 2 weeks. No new injury. No improvement with ibuprofen. She has an appointment to see orthopedics in early June, but doesn't feel that she can stand the pain until her appointment.  Past Medical History:  Diagnosis Date  . Diabetes mellitus without complication (HCC)   . Hypertension   . Migraines     There are no active problems to display for this patient.   Past Surgical History:  Procedure Laterality Date  . ABDOMINAL HYSTERECTOMY    . ANKLE SURGERY    . CESAREAN SECTION    . KNEE SURGERY      Prior to Admission medications   Medication Sig Start Date End Date Taking? Authorizing Provider  albuterol (PROVENTIL HFA;VENTOLIN HFA) 108 (90 BASE) MCG/ACT inhaler Inhale 2 puffs into the lungs every 6 (six) hours as needed for wheezing or shortness of breath. 08/26/14   Schaevitz, Myra Rudeavid Matthew, MD  amitriptyline (ELAVIL) 50 MG tablet Take 50 mg by mouth at bedtime.    [provider]  baclofen (LIORESAL) 10 MG tablet Take 1 tablet (10 mg total) by mouth 3 (three) times daily. 06/14/15   Lainee Lehrman, Kasandra Knudsenari B, FNP  Elastic Bandages & Supports (KNEE BRACE/HINGED BARS XL) MISC 1 Device by Does not apply route daily. 07/17/16   Shanna Un, Rulon Eisenmengerari B, FNP  gabapentin (NEURONTIN) 100 MG capsule Take 1 capsule (100 mg total) by mouth 3 (three) times daily. 09/20/15 09/19/16  Phineas SemenGoodman, Graydon, MD  HYDROcodone-acetaminophen (NORCO/VICODIN) 5-325 MG tablet Take 1 tablet by mouth every 4 (four) hours as needed for moderate pain. 07/15/15    Minna AntisPaduchowski, Kevin, MD  loratadine (CLARITIN) 10 MG tablet Take 10 mg by mouth daily.    [provider]  meloxicam (MOBIC) 15 MG tablet Take 1 tablet (15 mg total) by mouth daily. 07/17/16   Kaitlynn Tramontana, Rulon Eisenmengerari B, FNP  metFORMIN (GLUMETZA) 500 MG (MOD) 24 hr tablet Take 500 mg by mouth 2 (two) times daily with a meal.    [provider]  naproxen (NAPROSYN) 500 MG tablet Take 1 tablet (500 mg total) by mouth 2 (two) times daily with a meal. 07/01/15   Jene EveryKinner, Robert, MD  oxyCODONE (ROXICODONE) 5 MG immediate release tablet Take 1 tablet (5 mg total) by mouth at bedtime as needed. 12/25/15 12/24/16  Evon SlackGaines, Thomas C, PA-C  oxyCODONE-acetaminophen (ROXICET) 5-325 MG tablet Take 1 tablet by mouth every 8 (eight) hours as needed for severe pain. 07/17/16   Gjon Letarte, Rulon Eisenmengerari B, FNP  prochlorperazine (COMPAZINE) 10 MG tablet Take 1 tablet (10 mg total) by mouth every 8 (eight) hours as needed (headache). 09/22/15   Phineas SemenGoodman, Graydon, MD  SUMAtriptan-naproxen (TREXIMET) 85-500 MG per tablet Take 1 tablet by mouth every 2 (two) hours as needed for migraine.    [provider]  topiramate (TOPAMAX) 100 MG tablet Take 100 mg by mouth 2 (two) times daily.    [provider]  triamterene-hydrochlorothiazide (MAXZIDE-25) 37.5-25 MG per tablet Take 1 tablet by mouth daily.    [provider]  verapamil (COVERA HS) 180 MG (CO) 24 hr tablet  Take 180 mg by mouth at bedtime.    [provider]    Allergies Patient has no known allergies.  Family History  Problem Relation Age of Onset  . Breast cancer Neg Hx     Social History Social History  Substance Use Topics  . Smoking status: Never Smoker  . Smokeless tobacco: Current User    Types: Snuff  . Alcohol use Yes     Comment: occasionally    Review of Systems Constitutional: No recent illness. Cardiovascular: Denies chest pain or palpitations. Respiratory: Denies shortness of breath. Musculoskeletal: Pain in  Left knee and right ankle Skin: Negative for rash, wound, lesion. Neurological: Negative for focal weakness or numbness.  ____________________________________________   PHYSICAL EXAM:  VITAL SIGNS: ED Triage Vitals  Enc Vitals Group     BP 07/17/16 1744 (!) 144/95     Pulse Rate 07/17/16 1744 82     Resp 07/17/16 1744 18     Temp 07/17/16 1744 97.9 F (36.6 C)     Temp Source 07/17/16 1744 Oral     SpO2 07/17/16 1744 100 %     Weight 07/17/16 1742 250 lb (113.4 kg)     Height 07/17/16 1742 5\' 6"  (1.676 m)     Head Circumference --      Peak Flow --      Pain Score 07/17/16 1742 10     Pain Loc --      Pain Edu? --      Excl. in GC? --     Constitutional: Alert and oriented. Well appearing and in no acute distress. Eyes: Conjunctivae are normal. EOMI. Head: Atraumatic. Neck: No stridor.  Respiratory: Normal respiratory effort.   Musculoskeletal: Mild swelling over the medial aspect of the left knee. Joint is stable on varus and valgus stress. Right ankle moderately swollen over the lateral malleolus, but the joint is stable on exam. Neurologic:  Normal speech and language. No gross focal neurologic deficits are appreciated. Speech is normal. No gait instability. Skin:  Skin is warm, dry and intact. Atraumatic. Psychiatric: Mood and affect are normal. Speech and behavior are normal.  ____________________________________________   LABS (all labs ordered are listed, but only abnormal results are displayed)  Labs Reviewed - No data to display ____________________________________________  RADIOLOGY  Not indicated ____________________________________________   PROCEDURES  Procedure(s) performed: Ankle stirrup splint applied to the right ankle by ER tech. Patient neurovascularly intact post-application.  ____________________________________________   INITIAL IMPRESSION / ASSESSMENT AND PLAN / ED COURSE  51 year old female presenting to the emergency department  for acute on chronic left knee and right ankle pain. Office notes of [place], [place] and Dr. Ether Griffins reviewed, including treatment plan. Patient was encouraged to keep the follow-up with orthopedics and call tomorrow to schedule an appointment to see podiatry. She will be given a limited prescription of Percocet and meloxicam. ([place] Controlled Substance Database Reviewed prior to prescribing.) She was also given a prescription for a knee brace to be purchased at a medical supply store. She states that she has purchased several over-the-counter that do not fit well.  Pertinent labs & imaging results that were available during my care of the patient were reviewed by me and considered in my medical decision making (see chart for details).  _________________________________________   FINAL CLINICAL IMPRESSION(S) / ED DIAGNOSES  Final diagnoses:  Arthritis of left knee  Post-traumatic arthritis of right ankle    Discharge Medication List as of 07/17/2016  6:52 PM  START taking these medications   Details  Elastic Bandages & Supports (KNEE BRACE/HINGED BARS XL) MISC 1 Device by Does not apply route daily., Starting Tue 07/17/2016, Print    meloxicam (MOBIC) 15 MG tablet Take 1 tablet (15 mg total) by mouth daily., Starting Tue 07/17/2016, Print    oxyCODONE-acetaminophen (ROXICET) 5-325 MG tablet Take 1 tablet by mouth every 8 (eight) hours as needed for severe pain., Starting Tue 07/17/2016, Print        If controlled substance prescribed during this visit, 12 month history viewed on the [facility] prior to issuing an initial prescription for Schedule II or III opiod.    Chinita Pester, FNP 07/17/16 2003    Myrna Blazer, MD 07/18/16 [phone]

## 2016-07-17 NOTE — ED Triage Notes (Signed)
Pt to ed with c/o right knee and left ankle pain x 2 weeks.  Pt with swelling noted to both areas.

## 2016-09-14 DIAGNOSIS — M5481 Occipital neuralgia: Secondary | ICD-10-CM | POA: Insufficient documentation

## 2016-09-27 ENCOUNTER — Emergency Department: Payer: Self-pay

## 2016-09-27 ENCOUNTER — Encounter: Payer: Self-pay | Admitting: Emergency Medicine

## 2016-09-27 ENCOUNTER — Emergency Department
Admission: EM | Admit: 2016-09-27 | Discharge: 2016-09-28 | Disposition: A | Discharge status: Home / Self Care | Payer: Self-pay | Attending: Emergency Medicine | Admitting: Emergency Medicine

## 2016-09-27 ENCOUNTER — Other Ambulatory Visit: Payer: Self-pay

## 2016-09-27 DIAGNOSIS — R112 Nausea with vomiting, unspecified: Secondary | ICD-10-CM | POA: Insufficient documentation

## 2016-09-27 DIAGNOSIS — R1032 Left lower quadrant pain: Secondary | ICD-10-CM | POA: Insufficient documentation

## 2016-09-27 DIAGNOSIS — E119 Type 2 diabetes mellitus without complications: Secondary | ICD-10-CM | POA: Insufficient documentation

## 2016-09-27 DIAGNOSIS — I10 Essential (primary) hypertension: Secondary | ICD-10-CM | POA: Insufficient documentation

## 2016-09-27 DIAGNOSIS — Z79899 Other long term (current) drug therapy: Secondary | ICD-10-CM | POA: Insufficient documentation

## 2016-09-27 DIAGNOSIS — F17290 Nicotine dependence, other tobacco product, uncomplicated: Secondary | ICD-10-CM | POA: Insufficient documentation

## 2016-09-27 DIAGNOSIS — Z7984 Long term (current) use of oral hypoglycemic drugs: Secondary | ICD-10-CM | POA: Insufficient documentation

## 2016-09-27 DIAGNOSIS — R109 Unspecified abdominal pain: Secondary | ICD-10-CM

## 2016-09-27 LAB — URINALYSIS, COMPLETE (UACMP) WITH MICROSCOPIC
BILIRUBIN URINE: NEGATIVE
Bacteria, UA: NONE SEEN
GLUCOSE, UA: NEGATIVE mg/dL
HGB URINE DIPSTICK: NEGATIVE
Ketones, ur: NEGATIVE mg/dL
LEUKOCYTES UA: NEGATIVE
NITRITE: NEGATIVE
PH: 5 (ref 5.0–8.0)
Protein, ur: NEGATIVE mg/dL
RBC / HPF: NONE SEEN RBC/hpf (ref 0–5)
SPECIFIC GRAVITY, URINE: 1.015 (ref 1.005–1.030)

## 2016-09-27 LAB — COMPREHENSIVE METABOLIC PANEL
ALBUMIN: 3.9 g/dL (ref 3.5–5.0)
ALK PHOS: 62 U/L (ref 38–126)
ALT: 19 U/L (ref 14–54)
AST: 25 U/L (ref 15–41)
Anion gap: 12 (ref 5–15)
BILIRUBIN TOTAL: 0.7 mg/dL (ref 0.3–1.2)
BUN: 22 mg/dL — AB (ref 6–20)
CALCIUM: 9.3 mg/dL (ref 8.9–10.3)
CO2: 24 mmol/L (ref 22–32)
Chloride: 102 mmol/L (ref 101–111)
Creatinine, Ser: 1.28 mg/dL — ABNORMAL HIGH (ref 0.44–1.00)
GFR calc Af Amer: 56 mL/min — ABNORMAL LOW (ref >60)
GFR, EST NON AFRICAN AMERICAN: 49 mL/min — AB (ref >60)
GLUCOSE: 207 mg/dL — AB (ref 65–99)
POTASSIUM: 3.6 mmol/L (ref 3.5–5.1)
Sodium: 138 mmol/L (ref 135–145)
TOTAL PROTEIN: 6.8 g/dL (ref 6.5–8.1)

## 2016-09-27 LAB — CBC
HCT: 45.6 % (ref 35.0–47.0)
Hemoglobin: 14.8 g/dL (ref 12.0–16.0)
MCH: 27.2 pg (ref 26.0–34.0)
MCHC: 32.4 g/dL (ref 32.0–36.0)
MCV: 83.8 fL (ref 80.0–100.0)
PLATELETS: 334 10*3/uL (ref 150–440)
RBC: 5.44 MIL/uL — ABNORMAL HIGH (ref 3.80–5.20)
RDW: 14.8 % — AB (ref 11.5–14.5)
WBC: 17.5 10*3/uL — AB (ref 3.6–11.0)

## 2016-09-27 LAB — TROPONIN I

## 2016-09-27 LAB — LIPASE, BLOOD: Lipase: 22 U/L (ref 11–51)

## 2016-09-27 LAB — HCG, QUANTITATIVE, PREGNANCY: hCG, Beta Chain, Quant, S: <1 m[IU]/mL (ref <5)

## 2016-09-27 MED ORDER — HYDROCODONE-ACETAMINOPHEN 5-325 MG PO TABS
1.0000 | ORAL_TABLET | ORAL | Status: AC
  Administered 2016-09-27: 1 via ORAL
  Filled 2016-09-27: qty 1

## 2016-09-27 MED ORDER — IOPAMIDOL (ISOVUE-300) INJECTION 61%
100.0000 mL | Freq: Once | INTRAVENOUS | Status: AC | PRN
  Administered 2016-09-27: 100 mL via INTRAVENOUS

## 2016-09-27 MED ORDER — ONDANSETRON HCL 4 MG/2ML IJ SOLN
4.0000 mg | Freq: Once | INTRAMUSCULAR | Status: AC
  Administered 2016-09-27: 4 mg via INTRAVENOUS
  Filled 2016-09-27: qty 2

## 2016-09-27 MED ORDER — IOPAMIDOL (ISOVUE-300) INJECTION 61%
30.0000 mL | Freq: Once | INTRAVENOUS | Status: AC
  Administered 2016-09-27: 30 mL via ORAL

## 2016-09-27 MED ORDER — MORPHINE SULFATE (PF) 4 MG/ML IV SOLN
4.0000 mg | Freq: Once | INTRAVENOUS | Status: AC
  Administered 2016-09-27: 4 mg via INTRAVENOUS
  Filled 2016-09-27: qty 1

## 2016-09-27 NOTE — ED Notes (Signed)
Pt taken to CT at this time.

## 2016-09-27 NOTE — ED Triage Notes (Signed)
Pt from home with back pain and n/v/sweating today. Pt states she has had "a little" chest pain, but that the back pain has been more significant. Pt alert & oriented, in obvious pain when she moves in her wheelchair.

## 2016-09-27 NOTE — ED Provider Notes (Addendum)
[facility] Emergency Department Provider Note   ____________________________________________   First MD Initiated Contact with Patient 09/27/16 2110     (approximate)  I have reviewed the triage vital signs and the nursing notes.   HISTORY  Chief Complaint Back Pain; Nausea; and Emesis    HPI Michele [name] is a 51 y.o. female reports she was at work, she finished using a waving machine and as she was getting up from the table she began experiencing sudden pain in her left lower back and flank. She reports she felt very nauseated and the pain was severe enough that made her vomit once. Denies any fever. No recent illness. No shortness of breath or chest pain. No blood in her urine. No pain or burning on the bathroom.  Reports the pain comes and goes, located along the left lower back and feels like a spasm or sharp pain that seems to come and go. Reports she has a history of pain in her lower back, but never in this portion. No numbness or taking them. No weakness. No loss of bowel or bladder  No history of IV drug abuse. No history of cancer. No fall or injury   Past Medical History:  Diagnosis Date  . Diabetes mellitus without complication (HCC)   . Hypertension   . Migraines     There are no active problems to display for this patient.   Past Surgical History:  Procedure Laterality Date  . ABDOMINAL HYSTERECTOMY    . ANKLE SURGERY    . CESAREAN SECTION    . KNEE SURGERY      Prior to Admission medications   Medication Sig Start Date End Date Taking? Authorizing Provider  albuterol (PROVENTIL HFA;VENTOLIN HFA) 108 (90 BASE) MCG/ACT inhaler Inhale 2 puffs into the lungs every 6 (six) hours as needed for wheezing or shortness of breath. 08/26/14   Schaevitz, Myra Rude, MD  amitriptyline (ELAVIL) 50 MG tablet Take 50 mg by mouth at bedtime.    [provider]  baclofen (LIORESAL) 10 MG tablet Take 1 tablet (10 mg total) by mouth  3 (three) times daily. 06/14/15   Triplett, Kasandra Knudsen, FNP  Elastic Bandages & Supports (KNEE BRACE/HINGED BARS XL) MISC 1 Device by Does not apply route daily. 07/17/16   Triplett, Rulon Eisenmenger B, FNP  gabapentin (NEURONTIN) 100 MG capsule Take 1 capsule (100 mg total) by mouth 3 (three) times daily. 09/20/15 09/19/16  Phineas Semen, MD  HYDROcodone-acetaminophen (NORCO/VICODIN) 5-325 MG tablet Take 1 tablet by mouth every 6 (six) hours as needed for moderate pain. 09/28/16   Sharyn Creamer, MD  loratadine (CLARITIN) 10 MG tablet Take 10 mg by mouth daily.    [provider]  meloxicam (MOBIC) 15 MG tablet Take 1 tablet (15 mg total) by mouth daily. 07/17/16   Triplett, Rulon Eisenmenger B, FNP  metFORMIN (GLUMETZA) 500 MG (MOD) 24 hr tablet Take 500 mg by mouth 2 (two) times daily with a meal.    [provider]  naproxen (NAPROSYN) 500 MG tablet Take 1 tablet (500 mg total) by mouth 2 (two) times daily with a meal. 07/01/15   Jene Every, MD  prochlorperazine (COMPAZINE) 10 MG tablet Take 1 tablet (10 mg total) by mouth every 8 (eight) hours as needed (headache). 09/22/15   Phineas Semen, MD  SUMAtriptan-naproxen (TREXIMET) 85-500 MG per tablet Take 1 tablet by mouth every 2 (two) hours as needed for migraine.    [provider]  topiramate (TOPAMAX)  100 MG tablet Take 100 mg by mouth 2 (two) times daily.    [provider]  triamterene-hydrochlorothiazide (MAXZIDE-25) 37.5-25 MG per tablet Take 1 tablet by mouth daily.    [provider]  verapamil (COVERA HS) 180 MG (CO) 24 hr tablet Take 180 mg by mouth at bedtime.    [provider]    Allergies Patient has no known allergies.  Family History  Problem Relation Age of Onset  . Breast cancer Neg Hx     Social History Social History  Substance Use Topics  . Smoking status: Never Smoker  . Smokeless tobacco: Current User    Types: Snuff  . Alcohol use Yes     Comment: occasionally    Review of  Systems Constitutional: No fever/chills Eyes: No visual changes. ENT: No sore throat. Cardiovascular: Denies chest pain. Respiratory: Denies shortness of breath. Gastrointestinal: No abdominal pain though the pain does seem to radiate towards her left flank and left upper abdomen when the pain is severe.    No diarrhea.  No constipation. Genitourinary: Negative for dysuria. Musculoskeletal: See history of present illness Skin: Negative for rash. Neurological: Negative for headaches, focal weakness or numbness.    ____________________________________________   PHYSICAL EXAM:  VITAL SIGNS: ED Triage Vitals  Enc Vitals Group     BP 09/27/16 1814 (!) 142/103     Pulse Rate 09/27/16 1814 95     Resp 09/27/16 1814 18     Temp 09/27/16 1814 99.1 F (37.3 C)     Temp Source 09/27/16 1814 Oral     SpO2 09/27/16 1814 99 %     Weight 09/27/16 1815 253 lb (114.8 kg)     Height 09/27/16 1815 5\' 6"  (1.676 m)     Head Circumference --      Peak Flow --      Pain Score 09/27/16 1814 10     Pain Loc --      Pain Edu? --      Excl. in GC? --     Constitutional: Alert and oriented. Laying on her side cava appears in mild to moderate pain. Reports pain in the left posterior flank/upper paraspinous region along the lumbar spine Eyes: Conjunctivae are normal. Head: Atraumatic. Nose: No congestion/rhinnorhea. Mouth/Throat: Mucous membranes are moist. Neck: No stridor.   Cardiovascular: Normal rate, regular rhythm. Grossly normal heart sounds.  Good peripheral circulation. Respiratory: Normal respiratory effort.  No retractions. Lungs CTAB. Gastrointestinal: Soft and nontender except for minimal discomfort in the left mid abdomen and left upper quadrant, but reports significant tenderness along the left paraspinous muscles to palpation in the lumbar region without deformity.. No distention. Musculoskeletal: No lower extremity tenderness nor edema. Neurologic:  Normal speech and language. No  gross focal neurologic deficits are appreciated. Moves legs without weakness. No numbness or tingling in the lower legs. Skin:  Skin is warm, dry and intact. No rash noted. Psychiatric: Mood and affect are normal. Speech and behavior are normal.  ____________________________________________   LABS (all labs ordered are listed, but only abnormal results are displayed)  Labs Reviewed  COMPREHENSIVE METABOLIC PANEL - Abnormal; Notable for the following:       Result Value   Glucose, Bld 207 (*)    BUN 22 (*)    Creatinine, Ser 1.28 (*)    GFR calc non Af Amer 49 (*)    GFR calc Af Amer 56 (*)    All other components within normal limits  CBC -  Abnormal; Notable for the following:    WBC 17.5 (*)    RBC 5.44 (*)    RDW 14.8 (*)    All other components within normal limits  URINALYSIS, COMPLETE (UACMP) WITH MICROSCOPIC - Abnormal; Notable for the following:    Color, Urine YELLOW (*)    APPearance CLEAR (*)    Squamous Epithelial / LPF 0-5 (*)    All other components within normal limits  LIPASE, BLOOD  TROPONIN I  HCG, QUANTITATIVE, PREGNANCY   ____________________________________________  EKG  Reviewed and interpreted by me at 1830 Ventricular rate 90 QS 89 QTc 440 Normal sinus rhythm, no evidence of ischemia or ectopy. No right heart strain ____________________________________________  RADIOLOGY  Dg Chest 2 View  Result Date: 09/27/2016 CLINICAL DATA:  Central chest pain and shortness of breath for 2 days. EXAM: CHEST  2 VIEW COMPARISON:  07/15/2015 FINDINGS: Cardiomediastinal silhouette is normal. Mediastinal contours appear intact. Tortuosity of the thoracic aorta. There is no evidence of focal airspace consolidation, pleural effusion or pneumothorax. Osseous structures are without acute abnormality. Soft tissues are grossly normal. IMPRESSION: No active cardiopulmonary disease. Electronically Signed   By: Ted Mcalpineobrinka  Dimitrova M.D.   On: 09/27/2016 18:44   Ct Abdomen  Pelvis W Contrast  Result Date: 09/27/2016 CLINICAL DATA:  Back pain and diaphoresis today. EXAM: CT ABDOMEN AND PELVIS WITH CONTRAST TECHNIQUE: Multidetector CT imaging of the abdomen and pelvis was performed using the standard protocol following bolus administration of intravenous contrast. CONTRAST:  100mL ISOVUE-300 IOPAMIDOL (ISOVUE-300) INJECTION 61% COMPARISON:  04/14/2009, 08/19/2014. FINDINGS: Lower chest: No acute abnormality. Hepatobiliary: No focal liver abnormality is seen. No gallstones, gallbladder wall thickening, or biliary dilatation. Pancreas: Unremarkable. No pancreatic ductal dilatation or surrounding inflammatory changes. Spleen: Normal in size without focal abnormality. Adrenals/Urinary Tract: Adrenal glands are unremarkable. Kidneys are normal, without renal calculi, focal lesion, or hydronephrosis. Bladder is unremarkable. Stomach/Bowel: Stomach is within normal limits. Appendix is normal. No evidence of bowel wall thickening, distention, or inflammatory changes. Vascular/Lymphatic: No significant vascular findings are present. No enlarged abdominal or pelvic lymph nodes. Reproductive: Hysterectomy.  No adnexal abnormalities. Other: No focal inflammation. No ascites. Small fat containing umbilical hernia. Musculoskeletal: No significant skeletal lesions. Good preservation of lumbar intervertebral disc spaces. Facet articulations are well-preserved. IMPRESSION: No significant abnormality. Electronically Signed   By: Ellery Plunkaniel R Mitchell M.D.   On: 09/27/2016 23:36    ____________________________________________   PROCEDURES  Procedure(s) performed: None  Procedures  Critical Care performed: No  ____________________________________________   INITIAL IMPRESSION / ASSESSMENT AND PLAN / ED COURSE  Pertinent labs & imaging results that were available during my care of the patient were reviewed by me and considered in my medical decision making (see chart for details).  Patient  presents for evaluation of left flank pain. Abrupt in onset, primarily located on the left lower lumbar and lower thoracic paraspinous region on the left. Her port tenderness along the paraspinous ED region. Mild tenderness in the left flank. No lower or pelvic discomfort. No pelvic symptoms. Denies chest pain or shortness of breath. Does appear to be in moderate discomfort. Differential diagnosis includes but is not limited to, abdominal perforation, aortic dissection, cholecystitis, appendicitis, diverticulitis, colitis, esophagitis/gastritis, kidney stone, pyelonephritis, urinary tract infection, aortic aneurysm. All are considered in decision and treatment plan. Based upon the patient's presentation and risk factors, we'll proceed with CT scan of the abdomen and pelvis to further evaluate and exclude intra-abdominal and referred pain to the back. Denies infectious symptoms. Leukocytosis  noted, but may be reactive due to pain. No chest pain. Low risk for ACS.   EKG and troponin normal. Denies chest pain or trouble breathing. Denies chest pain. No hypoxia. No indication of acute chest pathology or process. No risk factors for pulmonary embolism.      Pulmonary Embolism Rule-out Criteria (PERC rule)                        If YES to ANY of the following, the PERC rule is not satisfied and cannot be used to rule out PE in this patient (consider d-dimer or imaging depending on pre-test probability).                      If NO to ALL of the following, AND the clinician's pre-test probability is <15%, the [facility] rule is satisfied and there is no need for further workup (including no need to obtain a d-dimer) as the post-test probability of pulmonary embolism is <2%.                      Mnemonic is HAD CLOTS   H - hormone use (exogenous estrogen)      No. A - age > 50                                                 No. D - DVT/PE history                                      No.   C - coughing blood  (hemoptysis)                 No. L - leg swelling, unilateral                             No. O - O2 Sat on Room Air < 95%                  No. T - tachycardia (HR ? 100)                         No. S - surgery or trauma, recent                      No.   Based on my evaluation of the patient, including application of this decision instrument, further testing to evaluate for pulmonary embolism is not indicated at this time.    I will prescribe the patient a narcotic pain medicine due to their condition which I anticipate will cause at least moderate pain short term. I discussed with the patient safe use of narcotic pain medicines, and that they are not to drive, work in dangerous areas, or ever take more than prescribed (no more than 1 pill every 6 hours). We discussed that this is the type of medication that can be  overdosed on and the risks of this type of medicine. Patient is very agreeable to only use as prescribed and to never use more than prescribed. Drug database reviewed. Only 1 prescription, oxycodone prescribed for only a few tablets in  May 2018 the patient reports these are all gone.  Return precautions and treatment recommendations and follow-up discussed with the patient who is agreeable with the plan. Patient having family pick her up. Understands not to drive this evening or this morning or within 8 hours of taking hydrocodone and after receiving pain medicine in the ER.      ____________________________________________   FINAL CLINICAL IMPRESSION(S) / ED DIAGNOSES  Final diagnoses:  Acute left flank pain      NEW MEDICATIONS STARTED DURING THIS VISIT:  New Prescriptions   HYDROCODONE-ACETAMINOPHEN (NORCO/VICODIN) 5-325 MG TABLET    Take 1 tablet by mouth every 6 (six) hours as needed for moderate pain.     Note:  This document was prepared using Dragon voice recognition software and may include unintentional dictation errors.     Sharyn Creamer, MD 09/28/16  Lazarus Gowda    Sharyn Creamer, MD 09/28/16 [phone]

## 2016-09-27 NOTE — ED Notes (Signed)
Attempted IV without success. Second RN at bedside to attempt.

## 2016-09-28 MED ORDER — MORPHINE SULFATE (PF) 2 MG/ML IV SOLN
2.0000 mg | Freq: Once | INTRAVENOUS | Status: AC
  Administered 2016-09-28: 2 mg via INTRAVENOUS
  Filled 2016-09-28: qty 1

## 2016-09-28 MED ORDER — HYDROCODONE-ACETAMINOPHEN 5-325 MG PO TABS: 1.0000 | ORAL_TABLET | Freq: Four times a day (QID) | ORAL | 0 refills | Status: DC | PRN

## 2016-09-28 NOTE — ED Notes (Signed)
This RN to bedside at this time. Pt visualized in NAD. Pain assessed and medications administered per MD order. Pt is alert and oriented at this time. BP and O2 obtained by this RN. Lights dimmed for patient comfort. Will continue to monitor for further patient needs.

## 2016-09-28 NOTE — Discharge Instructions (Signed)
You were seen in the emergency room for abdominal pain. It is important that you follow up closely with your primary care doctor in the next couple of days.  NO DRIVING TONIGHT or this morning OR WHILE TAKING HYDROCODONE!  Please return to the emergency room right away if you are to develop a fever, severe nausea, your pain becomes severe or worsens, you are unable to keep food down, begin vomiting any dark or bloody fluid, you develop any dark or bloody stools, feel dehydrated, or other new concerns or symptoms arise.

## 2016-10-04 ENCOUNTER — Encounter: Payer: Self-pay | Admitting: Emergency Medicine

## 2016-10-04 ENCOUNTER — Other Ambulatory Visit: Payer: Self-pay

## 2016-10-04 ENCOUNTER — Emergency Department
Admission: EM | Admit: 2016-10-04 | Discharge: 2016-10-04 | Disposition: A | Discharge status: Home / Self Care | Payer: PRIVATE HEALTH INSURANCE | Attending: Emergency Medicine | Admitting: Emergency Medicine

## 2016-10-04 DIAGNOSIS — F17290 Nicotine dependence, other tobacco product, uncomplicated: Secondary | ICD-10-CM | POA: Diagnosis not present

## 2016-10-04 DIAGNOSIS — E860 Dehydration: Secondary | ICD-10-CM

## 2016-10-04 DIAGNOSIS — M62830 Muscle spasm of back: Secondary | ICD-10-CM

## 2016-10-04 DIAGNOSIS — R42 Dizziness and giddiness: Secondary | ICD-10-CM

## 2016-10-04 DIAGNOSIS — E119 Type 2 diabetes mellitus without complications: Secondary | ICD-10-CM | POA: Diagnosis not present

## 2016-10-04 DIAGNOSIS — Z79899 Other long term (current) drug therapy: Secondary | ICD-10-CM | POA: Insufficient documentation

## 2016-10-04 DIAGNOSIS — Z7984 Long term (current) use of oral hypoglycemic drugs: Secondary | ICD-10-CM | POA: Insufficient documentation

## 2016-10-04 DIAGNOSIS — I10 Essential (primary) hypertension: Secondary | ICD-10-CM | POA: Diagnosis not present

## 2016-10-04 LAB — URINALYSIS, COMPLETE (UACMP) WITH MICROSCOPIC
BILIRUBIN URINE: NEGATIVE
Bacteria, UA: NONE SEEN
GLUCOSE, UA: NEGATIVE mg/dL
HGB URINE DIPSTICK: NEGATIVE
KETONES UR: NEGATIVE mg/dL
LEUKOCYTES UA: NEGATIVE
Nitrite: NEGATIVE
PH: 6 (ref 5.0–8.0)
Protein, ur: NEGATIVE mg/dL
RBC / HPF: NONE SEEN RBC/hpf (ref 0–5)
SPECIFIC GRAVITY, URINE: 1.013 (ref 1.005–1.030)

## 2016-10-04 LAB — POCT PREGNANCY, URINE: Preg Test, Ur: NEGATIVE

## 2016-10-04 LAB — BASIC METABOLIC PANEL
ANION GAP: 8 (ref 5–15)
BUN: 18 mg/dL (ref 6–20)
CHLORIDE: 105 mmol/L (ref 101–111)
CO2: 24 mmol/L (ref 22–32)
Calcium: 8.8 mg/dL — ABNORMAL LOW (ref 8.9–10.3)
Creatinine, Ser: 1.06 mg/dL — ABNORMAL HIGH (ref 0.44–1.00)
GFR calc Af Amer: >60 mL/min (ref >60)
Glucose, Bld: 124 mg/dL — ABNORMAL HIGH (ref 65–99)
POTASSIUM: 3.5 mmol/L (ref 3.5–5.1)
SODIUM: 137 mmol/L (ref 135–145)

## 2016-10-04 LAB — CBC
HCT: 43.9 % (ref 35.0–47.0)
HEMOGLOBIN: 14.5 g/dL (ref 12.0–16.0)
MCH: 26.9 pg (ref 26.0–34.0)
MCHC: 33 g/dL (ref 32.0–36.0)
MCV: 81.4 fL (ref 80.0–100.0)
PLATELETS: 255 10*3/uL (ref 150–440)
RBC: 5.39 MIL/uL — AB (ref 3.80–5.20)
RDW: 14.6 % — ABNORMAL HIGH (ref 11.5–14.5)
WBC: 9.7 10*3/uL (ref 3.6–11.0)

## 2016-10-04 MED ORDER — SODIUM CHLORIDE 0.9 % IV BOLUS (SEPSIS)
1000.0000 mL | Freq: Once | INTRAVENOUS | Status: AC
  Administered 2016-10-04: 1000 mL via INTRAVENOUS
  Filled 2016-10-04: qty 1000

## 2016-10-04 MED ORDER — KETOROLAC TROMETHAMINE 30 MG/ML IJ SOLN
15.0000 mg | Freq: Once | INTRAMUSCULAR | Status: AC
  Administered 2016-10-04: 15 mg via INTRAVENOUS
  Filled 2016-10-04: qty 1

## 2016-10-04 MED ORDER — CYCLOBENZAPRINE HCL 10 MG PO TABS: 10.0000 mg | ORAL_TABLET | Freq: Three times a day (TID) | ORAL | 0 refills | Status: DC | PRN

## 2016-10-04 MED ORDER — IBUPROFEN 600 MG PO TABS: 600.0000 mg | ORAL_TABLET | Freq: Four times a day (QID) | ORAL | 0 refills | Status: DC | PRN

## 2016-10-04 MED ORDER — IBUPROFEN 600 MG PO TABS
600.0000 mg | ORAL_TABLET | Freq: Once | ORAL | Status: AC
  Administered 2016-10-04: 600 mg via ORAL
  Filled 2016-10-04: qty 1

## 2016-10-04 MED ORDER — CYCLOBENZAPRINE HCL 10 MG PO TABS
10.0000 mg | ORAL_TABLET | Freq: Once | ORAL | Status: AC
  Administered 2016-10-04: 10 mg via ORAL
  Filled 2016-10-04: qty 1

## 2016-10-04 NOTE — ED Triage Notes (Signed)
First Nurse Note:  Arrives with c/o lower back pain and feeling "lightheaded".  MAE.  SKin warm and dry.  NAD

## 2016-10-04 NOTE — ED Notes (Addendum)
Pt presented to the ED with back pain, SOB, and dizziness. Lower back pain, and SOB has been present for past 3 weeks.  New onset of Dizziness at 0700. Pt is currently without chest pain, hypertension, nausea, and vomiting. Pt. States back pain is worse with movement, but is not effected by breathing. On observation pt is talking without difficulty,  respirations are not labored, chest rise and fall is symmetrical.

## 2016-10-04 NOTE — ED Triage Notes (Signed)
Pt c/o dizziness and feeling like going to pass out that started this morning.  Also c/o pain along entire left back X 3 weeks.  Respirations unlabored.  NAD. VSS.  Describes as lightheaded not room spinning.

## 2016-10-04 NOTE — ED Provider Notes (Signed)
[facility] Emergency Department Provider Note  ____________________________________________  Time seen: Approximately 9:57 AM  I have reviewed the triage vital signs and the nursing notes.   HISTORY  Chief Complaint Dizziness   HPI [name] is a 51 y.o. female with a history of diabetes and hypertension who presents for evaluation of lightheadedness and back pain. 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 3 weeks ago she was doing that and she developed left-sided back pain. The pain has been constant, diffuse across the left side of her back, currently 10 out of 10, nonradiating. Today she was sitting in the truck arriving at work and and reports that the pain got worse with a sharp severe component which made her feel dizzy like she was going to pass out.No chest pain, no shortness of breath, no nausea or vomiting, no diaphoresis, no fever or chills. No back trauma, no saddle anesthesia, no weakness or numbness of her lower extremities, no urinary or bowel incontinence or retention.  Past Medical History:  Diagnosis Date  . Diabetes mellitus without complication (HCC)   . Hypertension   . Migraines     There are no active problems to display for this patient.   Past Surgical History:  Procedure Laterality Date  . ABDOMINAL HYSTERECTOMY    . ANKLE SURGERY    . CESAREAN SECTION    . KNEE SURGERY      Prior to Admission medications   Medication Sig Start Date End Date Taking? Authorizing Provider  amitriptyline (ELAVIL) 50 MG tablet Take 50 mg by mouth at bedtime.   Yes [provider]  metFORMIN (GLUMETZA) 500 MG (MOD) 24 hr tablet Take 500 mg by mouth 2 (two) times daily with a meal.   Yes [provider]  SUMAtriptan-naproxen (TREXIMET) 85-500 MG per tablet Take 1 tablet by mouth every 2 (two) hours as needed for migraine.   Yes [provider]  topiramate (TOPAMAX) 100 MG tablet  Take 100 mg by mouth 2 (two) times daily.   Yes [provider]  triamterene-hydrochlorothiazide (MAXZIDE-25) 37.5-25 MG per tablet Take 1 tablet by mouth daily.   Yes [provider]  verapamil (COVERA HS) 180 MG (CO) 24 hr tablet Take 180 mg by mouth at bedtime.   Yes [provider]  albuterol (PROVENTIL HFA;VENTOLIN HFA) 108 (90 BASE) MCG/ACT inhaler Inhale 2 puffs into the lungs every 6 (six) hours as needed for wheezing or shortness of breath. 08/26/14   Schaevitz, Myra Rudeavid Matthew, MD  baclofen (LIORESAL) 10 MG tablet Take 1 tablet (10 mg total) by mouth 3 (three) times daily. Patient not taking: Reported on 10/04/2016 06/14/15   Kem Boroughsriplett, Cari B, FNP  cyclobenzaprine (FLEXERIL) 10 MG tablet Take 1 tablet (10 mg total) by mouth 3 (three) times daily as needed for muscle spasms. 10/04/16   Nita SickleVeronese, Rice, MD  Elastic Bandages & Supports (KNEE BRACE/HINGED BARS XL) MISC 1 Device by Does not apply route daily. 07/17/16   Triplett, Rulon Eisenmengerari B, FNP  gabapentin (NEURONTIN) 100 MG capsule Take 1 capsule (100 mg total) by mouth 3 (three) times daily. 09/20/15 09/19/16  Phineas SemenGoodman, Graydon, MD  HYDROcodone-acetaminophen (NORCO/VICODIN) 5-325 MG tablet Take 1 tablet by mouth every 6 (six) hours as needed for moderate pain. Patient not taking: Reported on 10/04/2016 09/28/16   Sharyn CreamerQuale, Mark, MD  ibuprofen (ADVIL,MOTRIN) 600 MG tablet Take 1 tablet (600 mg total) by mouth every 6 (six) hours as needed. 10/04/16  Don PerkingVeronese, [place], MD  loratadine (CLARITIN) 10 MG tablet Take 10 mg by mouth daily.    [provider]  meloxicam (MOBIC) 15 MG tablet Take 1 tablet (15 mg total) by mouth daily. Patient not taking: Reported on 10/04/2016 07/17/16   Kem Boroughsriplett, Cari B, FNP  naproxen (NAPROSYN) 500 MG tablet Take 1 tablet (500 mg total) by mouth 2 (two) times daily with a meal. Patient not taking: Reported on 10/04/2016 07/01/15   Jene EveryKinner, Robert, MD  prochlorperazine (COMPAZINE) 10 MG tablet Take 1 tablet  (10 mg total) by mouth every 8 (eight) hours as needed (headache). Patient not taking: Reported on 10/04/2016 09/22/15   Phineas SemenGoodman, Gray[place], MD    Allergies Patient has no known allergies.  Family History  Problem Relation Age of Onset  . Breast cancer Neg Hx     Social History Social History  Substance Use Topics  . Smoking status: Never Smoker  . Smokeless tobacco: Current User    Types: Snuff  . Alcohol use Yes     Comment: occasionally    Review of Systems  Constitutional: Negative for fever. + Lightheadedness Eyes: Negative for visual changes. ENT: Negative for sore throat. Neck: No neck pain  Cardiovascular: Negative for chest pain. Respiratory: Negative for shortness of breath. Gastrointestinal: Negative for abdominal pain, vomiting or diarrhea. Genitourinary: Negative for dysuria. Musculoskeletal: + back pain. Skin: Negative for rash. Neurological: Negative for headaches, weakness or numbness. Psych: No SI or HI  ____________________________________________   PHYSICAL EXAM:  VITAL SIGNS: ED Triage Vitals  Enc Vitals Group     BP 10/04/16 0852 (!) 126/97     Pulse Rate 10/04/16 0852 91     Resp 10/04/16 0852 18     Temp 10/04/16 0852 98.9 F (37.2 C)     Temp Source 10/04/16 0852 Oral     SpO2 10/04/16 0852 97 %     Weight 10/04/16 0852 253 lb (114.8 kg)     Height 10/04/16 0852 5\' 6"  (1.676 m)     Head Circumference --      Peak Flow --      Pain Score 10/04/16 0854 10     Pain Loc --      Pain Edu? --      Excl. in GC? --     Constitutional: Alert and oriented. Well appearing and in no apparent distress. HEENT:      Head: Normocephalic and atraumatic.         Eyes: Conjunctivae are normal. Sclera is non-icteric.       Mouth/Throat: Mucous membranes are moist.       Neck: Supple with no signs of meningismus. Cardiovascular: Regular rate and rhythm. No murmurs, gallops, or rubs. 2+ symmetrical distal pulses are present in all extremities. No  JVD. Respiratory: Normal respiratory effort. Lungs are clear to auscultation bilaterally. No wheezes, crackles, or rhonchi.  Gastrointestinal: Soft, non tender, and non distended with positive bowel sounds. No rebound or guarding. Musculoskeletal: Diffuse paraspinal tenderness on the left to from lower thoracic to lower lumbar area. No midline tenderness palpation on CT and L-spine. Nontender with normal range of motion in all extremities. No edema, cyanosis, or erythema of extremities. Neurologic: Normal speech and language. Face is symmetric. Moving all extremities. Normal strength and DTRs on b/l LE. No gross focal neurologic deficits are appreciated. Normal gait Skin: Skin is warm, dry and intact. No rash noted. Psychiatric: Mood and affect are normal. Speech and behavior are normal.  ____________________________________________  LABS (all labs ordered are listed, but only abnormal results are displayed)  Labs Reviewed  CBC - Abnormal; Notable for the following:       Result Value   RBC 5.39 (*)    RDW 14.6 (*)    All other components within normal limits  BASIC METABOLIC PANEL - Abnormal; Notable for the following:    Glucose, Bld 124 (*)    Creatinine, Ser 1.06 (*)    Calcium 8.8 (*)    All other components within normal limits  URINALYSIS, COMPLETE (UACMP) WITH MICROSCOPIC - Abnormal; Notable for the following:    Color, Urine YELLOW (*)    APPearance CLEAR (*)    Squamous Epithelial / LPF 6-30 (*)    All other components within normal limits  POCT PREGNANCY, URINE   ____________________________________________  EKG  ED ECG REPORT I, Nita Sickle, the attending physician, personally viewed and interpreted this ECG.  Normal sinus rhythm, rate of 88, normal intervals, normal axis, no ST elevations or depressions, T-wave inversion in lead 3.  ____________________________________________  RADIOLOGY  none   ____________________________________________   PROCEDURES  Procedure(s) performed: None Procedures Critical Care performed:  None ____________________________________________   INITIAL IMPRESSION / ASSESSMENT AND PLAN / ED COURSE  51 y.o. female with a history of diabetes and hypertension who presents for evaluation of back pain x 3 weeks, more severe this morning leading to an episode of lightheadedness. No signs or symptoms of cauda equina. Patient has diffuse tenderness in the left paraspinal region. She is neurologically intact. No midline CT and L-spine tenderness. Presentation concerning for muscle spasms. We'll treat with Toradol and Flexeril.     _________________________ 2:40 PM on 10/04/2016 ----------------------------------------- Initial vital signs showing orthostasis. Patient was given IV fluids with resolution of her orthostasis. Blood work with no acute findings. Patient's condition with discharge on Flexeril and ibuprofen for back pain. Recommend that she does not lift anything heavy at work and to pain has resolved. Patient provided with a work note.   Pertinent labs & imaging results that were available during my care of the patient were reviewed by me and considered in my medical decision making (see chart for details).    ____________________________________________   FINAL CLINICAL IMPRESSION(S) / ED DIAGNOSES  Final diagnoses:  Lightheadedness  Orthostatic dizziness  Dehydration  Spasm of muscle, back      NEW MEDICATIONS STARTED DURING THIS VISIT:  New Prescriptions   CYCLOBENZAPRINE (FLEXERIL) 10 MG TABLET    Take 1 tablet (10 mg total) by mouth 3 (three) times daily as needed for muscle spasms.   IBUPROFEN (ADVIL,MOTRIN) 600 MG TABLET    Take 1 tablet (600 mg total) by mouth every 6 (six) hours as needed.     Note:  This document was prepared using Dragon voice recognition software and may include unintentional dictation errors.     Don Perking, [place], MD 10/04/16 [phone]

## 2016-10-04 NOTE — ED Notes (Signed)
NAD noted at this time. Pt resting in bed with eyes closed, respirations even and unlabored. Skin warm and dry. Lights are noted to continue to be dimmed with care channel playing in the background. VSS. Will continue to monitor for further patient needs.

## 2016-10-04 NOTE — ED Notes (Signed)
This RN to bedside, pt is noted to be resting in bed with lights dimmed and eyes closed. Respirations even and unlabored, VSS and WNL. Will continue to monitor for further patient needs.

## 2016-10-04 NOTE — ED Notes (Signed)
This RN to bedside at this time. Pt awakens with mild stimuli. Pt is neurologically intact. This RN introduced self to patient. Pt denies any complaints or any needs. Pt states that she feels better. Fluids are noted to be flowing unobstructed at this time. Explained waiting for patient to finish getting her fluids. Pt states understanding. Will continue to monitor for further patient needs.

## 2016-10-04 NOTE — ED Notes (Signed)
NAD noted at time of D/C. Pt denies questions or concerns. Pt taken to the lobby via wheelchair by this RN at this time.   

## 2016-10-04 NOTE — Discharge Instructions (Signed)
You have been seen in the Emergency Department (ED)  today for back pain.  Back pain has many possible causes some are related to muscles while others have more serious causes. Even though you were checked carefully today and your exam and evaluation were reassuring, problems may develop later or continue to unfold. Therefore it is imperative that you follow up with doctor closely for further evaluation.  Follow-up with your doctor in 1 day for further evaluation.  For pain control take: Ibuprofen 600 mg every 6 hours and Flexeril was prescribed  When should you call for help?  Call your doctor now or seek immediate medical care if:  You have new or worsening numbness in your legs.  You have new or worsening weakness in your legs. (This could make it hard to stand up.)  You lose control of your bladder or bowels or if you are unable to urinate. You have numbness of your groin or buttock region If you develop a fever  Watch closely for changes in your health, and be sure to contact your doctor if:  Your pain gets worse.  You are not getting better after 2 weeks.  How can you care for yourself at home?  Take pain medicines exactly as directed.  If the doctor gave you a prescription medicine for pain, take it as prescribed.  If you are not taking a prescription pain medicine, ask your doctor if you can take an over-the-counter medicine like tylenol or ibuprofen. Sit or lie in positions that are most comfortable and reduce your pain. Try one of these positions when you lie down:  Lie on your back with your knees bent and supported by large pillows.  Lie on the floor with your legs on the seat of a sofa or chair.  Lie on your side with your knees and hips bent and a pillow between your legs.  Lie on your stomach if it does not make pain worse. Do not sit up in bed, and avoid soft couches and twisted positions. Bed rest can help relieve pain at first, but it delays healing. Avoid bed rest after  the first day of back pain.  Change positions every 30 minutes. If you must sit for long periods of time, take breaks from sitting. Get up and walk around, or lie in a comfortable position.  Try using a heating pad on a low or medium setting for 15 to 20 minutes every 2 or 3 hours. Try a warm shower in place of one session with the heating pad.  You can also try an ice pack for 10 to 15 minutes every 2 to 3 hours. Put a thin cloth between the ice pack and your skin.  Take short walks several times a day. You can start with 5 to 10 minutes, 3 or 4 times a day, and work up to longer walks. Walk on level surfaces and avoid hills and stairs until your back is better.  Return to work and other activities as soon as you can. Continued rest without activity is usually not good for your back.  To prevent future back pain, do exercises to stretch and strengthen your back and stomach. Learn how to use good posture, safe lifting techniques, and proper body mechanics.

## 2016-12-16 ENCOUNTER — Other Ambulatory Visit: Payer: Self-pay | Admitting: Internal Medicine

## 2016-12-16 DIAGNOSIS — Z1231 Encounter for screening mammogram for malignant neoplasm of breast: Secondary | ICD-10-CM

## 2016-12-19 ENCOUNTER — Ambulatory Visit
Admission: RE | Admit: 2016-12-19 | Discharge: 2016-12-19 | Disposition: A | Discharge status: Home / Self Care | Payer: PRIVATE HEALTH INSURANCE | Source: Ambulatory Visit | Attending: Internal Medicine | Admitting: Internal Medicine

## 2016-12-19 DIAGNOSIS — Z1231 Encounter for screening mammogram for malignant neoplasm of breast: Secondary | ICD-10-CM | POA: Diagnosis not present

## 2016-12-19 DIAGNOSIS — R928 Other abnormal and inconclusive findings on diagnostic imaging of breast: Secondary | ICD-10-CM | POA: Diagnosis not present

## 2016-12-19 DIAGNOSIS — N6489 Other specified disorders of breast: Secondary | ICD-10-CM | POA: Insufficient documentation

## 2016-12-25 ENCOUNTER — Other Ambulatory Visit: Payer: Self-pay | Admitting: Internal Medicine

## 2016-12-25 DIAGNOSIS — R928 Other abnormal and inconclusive findings on diagnostic imaging of breast: Secondary | ICD-10-CM

## 2016-12-25 DIAGNOSIS — N6489 Other specified disorders of breast: Secondary | ICD-10-CM

## 2016-12-31 ENCOUNTER — Ambulatory Visit
Admission: RE | Admit: 2016-12-31 | Discharge: 2016-12-31 | Disposition: A | Discharge status: Home / Self Care | Payer: PRIVATE HEALTH INSURANCE | Source: Ambulatory Visit | Attending: Internal Medicine | Admitting: Internal Medicine

## 2016-12-31 DIAGNOSIS — N6489 Other specified disorders of breast: Secondary | ICD-10-CM

## 2016-12-31 DIAGNOSIS — R928 Other abnormal and inconclusive findings on diagnostic imaging of breast: Secondary | ICD-10-CM

## 2017-02-24 ENCOUNTER — Encounter: Payer: Self-pay | Admitting: Emergency Medicine

## 2017-02-24 ENCOUNTER — Other Ambulatory Visit: Payer: Self-pay

## 2017-02-24 ENCOUNTER — Emergency Department: Payer: PRIVATE HEALTH INSURANCE

## 2017-02-24 ENCOUNTER — Emergency Department
Admission: EM | Admit: 2017-02-24 | Discharge: 2017-02-24 | Disposition: A | Discharge status: Home / Self Care | Payer: PRIVATE HEALTH INSURANCE | Attending: Emergency Medicine | Admitting: Emergency Medicine

## 2017-02-24 DIAGNOSIS — R51 Headache: Secondary | ICD-10-CM | POA: Diagnosis not present

## 2017-02-24 DIAGNOSIS — I10 Essential (primary) hypertension: Secondary | ICD-10-CM | POA: Insufficient documentation

## 2017-02-24 DIAGNOSIS — E119 Type 2 diabetes mellitus without complications: Secondary | ICD-10-CM | POA: Insufficient documentation

## 2017-02-24 DIAGNOSIS — Z7984 Long term (current) use of oral hypoglycemic drugs: Secondary | ICD-10-CM | POA: Insufficient documentation

## 2017-02-24 DIAGNOSIS — R079 Chest pain, unspecified: Secondary | ICD-10-CM | POA: Diagnosis not present

## 2017-02-24 DIAGNOSIS — Z79899 Other long term (current) drug therapy: Secondary | ICD-10-CM | POA: Diagnosis not present

## 2017-02-24 DIAGNOSIS — F17220 Nicotine dependence, chewing tobacco, uncomplicated: Secondary | ICD-10-CM | POA: Insufficient documentation

## 2017-02-24 DIAGNOSIS — R519 Headache, unspecified: Secondary | ICD-10-CM

## 2017-02-24 LAB — BASIC METABOLIC PANEL
Anion gap: 8 (ref 5–15)
BUN: 19 mg/dL (ref 6–20)
CHLORIDE: 101 mmol/L (ref 101–111)
CO2: 25 mmol/L (ref 22–32)
Calcium: 8.8 mg/dL — ABNORMAL LOW (ref 8.9–10.3)
Creatinine, Ser: 1.07 mg/dL — ABNORMAL HIGH (ref 0.44–1.00)
GFR, EST NON AFRICAN AMERICAN: 60 mL/min — AB (ref >60)
Glucose, Bld: 311 mg/dL — ABNORMAL HIGH (ref 65–99)
POTASSIUM: 3.7 mmol/L (ref 3.5–5.1)
SODIUM: 134 mmol/L — AB (ref 135–145)

## 2017-02-24 LAB — CBC
HEMATOCRIT: 43.7 % (ref 35.0–47.0)
Hemoglobin: 14.1 g/dL (ref 12.0–16.0)
MCH: 26.8 pg (ref 26.0–34.0)
MCHC: 32.2 g/dL (ref 32.0–36.0)
MCV: 83.2 fL (ref 80.0–100.0)
PLATELETS: 240 10*3/uL (ref 150–440)
RBC: 5.26 MIL/uL — AB (ref 3.80–5.20)
RDW: 14.2 % (ref 11.5–14.5)
WBC: 7.9 10*3/uL (ref 3.6–11.0)

## 2017-02-24 LAB — TROPONIN I: Troponin I: <0.03 ng/mL (ref <0.03)

## 2017-02-24 MED ORDER — SODIUM CHLORIDE 0.9 % IV BOLUS (SEPSIS)
1000.0000 mL | Freq: Once | INTRAVENOUS | Status: AC
  Administered 2017-02-24: 1000 mL via INTRAVENOUS

## 2017-02-24 MED ORDER — DIPHENHYDRAMINE HCL 50 MG/ML IJ SOLN
25.0000 mg | Freq: Once | INTRAMUSCULAR | Status: AC
  Administered 2017-02-24: 25 mg via INTRAVENOUS
  Filled 2017-02-24: qty 1

## 2017-02-24 MED ORDER — PROCHLORPERAZINE EDISYLATE 5 MG/ML IJ SOLN
10.0000 mg | Freq: Once | INTRAMUSCULAR | Status: AC
  Administered 2017-02-24: 10 mg via INTRAVENOUS
  Filled 2017-02-24: qty 2

## 2017-02-24 MED ORDER — KETOROLAC TROMETHAMINE 30 MG/ML IJ SOLN
30.0000 mg | Freq: Once | INTRAMUSCULAR | Status: AC
  Administered 2017-02-24: 30 mg via INTRAVENOUS
  Filled 2017-02-24: qty 1

## 2017-02-24 NOTE — ED Notes (Signed)
Pt reports she continues to have dizziness but reports it has lessened and she feels safe to go home and rest. Pt verbalized understanding of follow up information and home care. Pt taken to car via Wheelchair by this RN .

## 2017-02-24 NOTE — ED Triage Notes (Signed)
Pt to ED c/o central chest pain for 3 days. Pt states that she has had lightheadedness, nausea and shortness of breath. Pt denies radiation of the pain. Pt in NAD at this time.

## 2017-02-24 NOTE — ED Provider Notes (Signed)
[facility] Emergency Department Provider Note  ____________________________________________   First MD Initiated Contact with Patient 02/24/17 1009     (approximate)  I have reviewed the triage vital signs and the nursing notes.   HISTORY  Chief Complaint Chest Pain   HPI [name] is a 51 y.o. female with a history of diabetes, hypertension and migraine headaches was presenting with 3 days of chest pain as well as frontal headache.  She says the chest pain is a throbbing which is been constant to the center of her chest and radiating through to her back.  She has not reporting any shortness of breath but says that she has had nausea.  Denies any worsening with activity or alleviating factors as well.  Says the headache is frontal/bitemporal and is an 8-9 out of 10 at this time.  She says that she is a dizziness sensation associated with it as well as photophobia.  Says that she took a dose of Treximet at home which did not help.  Says that she has a history of heart disease in her family, especially in her dad who died at 7065 of heart disease.  Past Medical History:  Diagnosis Date  . Diabetes mellitus without complication (HCC)   . Hypertension   . Migraines     There are no active problems to display for this patient.   Past Surgical History:  Procedure Laterality Date  . ABDOMINAL HYSTERECTOMY    . ANKLE SURGERY    . CESAREAN SECTION    . KNEE SURGERY      Prior to Admission medications   Medication Sig Start Date End Date Taking? Authorizing Provider  albuterol (PROVENTIL HFA;VENTOLIN HFA) 108 (90 BASE) MCG/ACT inhaler Inhale 2 puffs into the lungs every 6 (six) hours as needed for wheezing or shortness of breath. 08/26/14   Juletta Berhe, Myra Rudeavid Matthew, MD  amitriptyline (ELAVIL) 50 MG tablet Take 50 mg by mouth at bedtime.    [provider]  baclofen (LIORESAL) 10 MG tablet Take 1 tablet (10 mg total) by mouth 3 (three) times  daily. Patient not taking: Reported on 10/04/2016 06/14/15   Kem Boroughsriplett, Cari B, FNP  cyclobenzaprine (FLEXERIL) 10 MG tablet Take 1 tablet (10 mg total) by mouth 3 (three) times daily as needed for muscle spasms. 10/04/16   Nita SickleVeronese, [place], MD  Elastic Bandages & Supports (KNEE BRACE/HINGED BARS XL) MISC 1 Device by Does not apply route daily. 07/17/16   Triplett, Rulon Eisenmengerari B, FNP  gabapentin (NEURONTIN) 100 MG capsule Take 1 capsule (100 mg total) by mouth 3 (three) times daily. 09/20/15 09/19/16  Phineas SemenGoodman, [place], MD  HYDROcodone-acetaminophen (NORCO/VICODIN) 5-325 MG tablet Take 1 tablet by mouth every 6 (six) hours as needed for moderate pain. Patient not taking: Reported on 10/04/2016 09/28/16   Sharyn CreamerQuale, Mark, MD  ibuprofen (ADVIL,MOTRIN) 600 MG tablet Take 1 tablet (600 mg total) by mouth every 6 (six) hours as needed. 10/04/16   Nita SickleVeronese, , MD  loratadine (CLARITIN) 10 MG tablet Take 10 mg by mouth daily.    [provider]  meloxicam (MOBIC) 15 MG tablet Take 1 tablet (15 mg total) by mouth daily. Patient not taking: Reported on 10/04/2016 07/17/16   Kem Boroughsriplett, Cari B, FNP  metFORMIN (GLUMETZA) 500 MG (MOD) 24 hr tablet Take 500 mg by mouth 2 (two) times daily with a meal.    [provider]  naproxen (NAPROSYN) 500 MG tablet Take 1 tablet (500 mg total) by mouth 2 (two)  times daily with a meal. Patient not taking: Reported on 10/04/2016 07/01/15   Jene EveryKinner, Robert, MD  prochlorperazine (COMPAZINE) 10 MG tablet Take 1 tablet (10 mg total) by mouth every 8 (eight) hours as needed (headache). Patient not taking: Reported on 10/04/2016 09/22/15   Phineas SemenGoodman, [place], MD  SUMAtriptan-naproxen (TREXIMET) 85-500 MG per tablet Take 1 tablet by mouth every 2 (two) hours as needed for migraine.    [provider]  topiramate (TOPAMAX) 100 MG tablet Take 100 mg by mouth 2 (two) times daily.    [provider]  triamterene-hydrochlorothiazide (MAXZIDE-25) 37.5-25 MG per tablet Take 1 tablet  by mouth daily.    [provider]  verapamil (COVERA HS) 180 MG (CO) 24 hr tablet Take 180 mg by mouth at bedtime.    [provider]    Allergies Patient has no known allergies.  Family History  Problem Relation Age of Onset  . Breast cancer Neg Hx     Social History Social History   Tobacco Use  . Smoking status: Never Smoker  . Smokeless tobacco: Current User    Types: Snuff  Substance Use Topics  . Alcohol use: Yes    Comment: occasionally  . Drug use: No    Review of Systems  Constitutional: No fever/chills Eyes: No visual changes. ENT: No sore throat. Cardiovascular: As above Respiratory: Denies shortness of breath. Gastrointestinal: No abdominal pain.   no vomiting.  No diarrhea.  No constipation. Genitourinary: Negative for dysuria. Musculoskeletal: Negative for back pain. Skin: Negative for rash. Neurological: Negative for focal weakness or numbness.   ____________________________________________   PHYSICAL EXAM:  VITAL SIGNS: ED Triage Vitals  Enc Vitals Group     BP 02/24/17 0927 (!) 148/78     Pulse Rate 02/24/17 0927 74     Resp 02/24/17 0927 16     Temp 02/24/17 0927 97.9 F (36.6 C)     Temp Source 02/24/17 0927 Oral     SpO2 02/24/17 0927 100 %     Weight 02/24/17 0928 247 lb (112 kg)     Height 02/24/17 0928 5\' 6"  (1.676 m)     Head Circumference --      Peak Flow --      Pain Score 02/24/17 0927 9     Pain Loc --      Pain Edu? --      Excl. in GC? --     Constitutional: Alert and oriented. Well appearing and in no acute distress. Eyes: Conjunctivae are normal.  No nystagmus  Head: Atraumatic. Nose: No congestion/rhinnorhea. Mouth/Throat: Mucous membranes are moist.  Neck: No stridor.   Cardiovascular: Normal rate, regular rhythm. Grossly normal heart sounds.  Good peripheral circulation with equal and bilateral radial as well as dorsalis pedis pulses.  Chest pain is reproducible to palpation over the  sternum. Respiratory: Normal respiratory effort.  No retractions. Lungs CTAB. Gastrointestinal: Soft and nontender. No distention. No CVA tenderness. Musculoskeletal: No lower extremity tenderness nor edema.  No joint effusions. Neurologic:  Normal speech and language. No gross focal neurologic deficits are appreciated. Skin:  Skin is warm, dry and intact. No rash noted. Psychiatric: Mood and affect are normal. Speech and behavior are normal.  ____________________________________________   LABS (all labs ordered are listed, but only abnormal results are displayed)  Labs Reviewed  BASIC METABOLIC PANEL - Abnormal; Notable for the following components:      Result Value   Sodium 134 (*)    Glucose, Bld  311 (*)    Creatinine, Ser 1.07 (*)    Calcium 8.8 (*)    GFR calc non Af Amer 60 (*)    All other components within normal limits  CBC - Abnormal; Notable for the following components:   RBC 5.26 (*)    All other components within normal limits  TROPONIN I   ____________________________________________  EKG  ED ECG REPORT I, Arelia Longest, the attending physician, personally viewed and interpreted this ECG.   Date: 02/24/2017  EKG Time: 0922  Rate: 70  Rhythm: normal sinus rhythm  Axis: Normal  Intervals:none  ST&T Change: No ST segment elevation or depression.  No abnormal T wave inversion.  ____________________________________________  RADIOLOGY  No acute finding on chest x-ray nor the head CT. ____________________________________________   PROCEDURES  Procedure(s) performed:   Procedures  Critical Care performed:   ____________________________________________   INITIAL IMPRESSION / ASSESSMENT AND PLAN / ED COURSE  Pertinent labs & imaging results that were available during my care of the patient were reviewed by me and considered in my medical decision making (see chart for details).  Differential diagnosis includes, but is not limited to, ACS,  aortic dissection, pulmonary embolism, cardiac tamponade, pneumothorax, pneumonia, pericarditis, myocarditis, GI-related causes including esophagitis/gastritis, and musculoskeletal chest wall pain.   Differential diagnosis includes, but is not limited to, intracranial hemorrhage, meningitis/encephalitis, previous head trauma, cavernous venous thrombosis, tension headache, temporal arteritis, migraine or migraine equivalent, idiopathic intracranial hypertension, and non-specific headache. As part of my medical decision making, I reviewed the following data within the electronic MEDICAL RECORD NUMBER Notes from prior ED visits  Patient is PERC negative.  Atypical chest pain story given 3 days of constant pain with a normal EKG and negative troponin.  Migraine type symptoms.  We will try a migraine cocktail and reassess.    ----------------------------------------- 12:06 PM on 02/24/2017 -----------------------------------------  Patient asleep in the room but easily awoken.  She is reporting improvement in her headache and dizziness.  Not reporting any chest pain at this time.  Very reassuring workup.  She says that she feels ready to go home.  I believe this is appropriate at this time.  Likely vertiginous symptoms secondary to headache.  No objective neurological deficits.  Low risk chest pain given 3 days of symptoms and reproducibility.     ____________________________________________   FINAL CLINICAL IMPRESSION(S) / ED DIAGNOSES  Chest pain.  Headache.    NEW MEDICATIONS STARTED DURING THIS VISIT:  This SmartLink is deprecated. Use AVSMEDLIST instead to display the medication list for a patient.   Note:  This document was prepared using Dragon voice recognition software and may include unintentional dictation errors.     Myrna Blazer, MD 02/24/17 [phone]

## 2017-02-24 NOTE — ED Notes (Signed)
Pt also noted to be c/o headache. Pt states that she has hx/o migraines. Pt states that this is a little worse than her normal migraines. Order for head CT placed.

## 2017-03-29 ENCOUNTER — Other Ambulatory Visit: Payer: Self-pay

## 2017-03-29 ENCOUNTER — Encounter: Payer: Self-pay | Admitting: Emergency Medicine

## 2017-03-29 ENCOUNTER — Emergency Department
Admission: EM | Admit: 2017-03-29 | Discharge: 2017-03-29 | Disposition: A | Discharge status: Home / Self Care | Payer: PRIVATE HEALTH INSURANCE | Attending: Emergency Medicine | Admitting: Emergency Medicine

## 2017-03-29 DIAGNOSIS — B9689 Other specified bacterial agents as the cause of diseases classified elsewhere: Secondary | ICD-10-CM

## 2017-03-29 DIAGNOSIS — R739 Hyperglycemia, unspecified: Secondary | ICD-10-CM

## 2017-03-29 DIAGNOSIS — E1165 Type 2 diabetes mellitus with hyperglycemia: Secondary | ICD-10-CM | POA: Insufficient documentation

## 2017-03-29 DIAGNOSIS — I10 Essential (primary) hypertension: Secondary | ICD-10-CM | POA: Diagnosis not present

## 2017-03-29 DIAGNOSIS — N760 Acute vaginitis: Secondary | ICD-10-CM | POA: Insufficient documentation

## 2017-03-29 DIAGNOSIS — Z7984 Long term (current) use of oral hypoglycemic drugs: Secondary | ICD-10-CM | POA: Insufficient documentation

## 2017-03-29 DIAGNOSIS — R682 Dry mouth, unspecified: Secondary | ICD-10-CM | POA: Diagnosis present

## 2017-03-29 LAB — COMPREHENSIVE METABOLIC PANEL
ALT: 31 U/L (ref 14–54)
AST: 25 U/L (ref 15–41)
Albumin: 4.7 g/dL (ref 3.5–5.0)
Alkaline Phosphatase: 114 U/L (ref 38–126)
Anion gap: 16 — ABNORMAL HIGH (ref 5–15)
BUN: 12 mg/dL (ref 6–20)
CHLORIDE: 95 mmol/L — AB (ref 101–111)
CO2: 24 mmol/L (ref 22–32)
Calcium: 9.7 mg/dL (ref 8.9–10.3)
Creatinine, Ser: 1.05 mg/dL — ABNORMAL HIGH (ref 0.44–1.00)
Glucose, Bld: 428 mg/dL — ABNORMAL HIGH (ref 65–99)
POTASSIUM: 3.5 mmol/L (ref 3.5–5.1)
Sodium: 135 mmol/L (ref 135–145)
Total Bilirubin: 1 mg/dL (ref 0.3–1.2)
Total Protein: 7.9 g/dL (ref 6.5–8.1)

## 2017-03-29 LAB — URINALYSIS, COMPLETE (UACMP) WITH MICROSCOPIC
BILIRUBIN URINE: NEGATIVE
HGB URINE DIPSTICK: NEGATIVE
Ketones, ur: 20 mg/dL — AB
LEUKOCYTES UA: NEGATIVE
NITRITE: NEGATIVE
PH: 5 (ref 5.0–8.0)
Protein, ur: NEGATIVE mg/dL
SPECIFIC GRAVITY, URINE: 1.039 — AB (ref 1.005–1.030)

## 2017-03-29 LAB — WET PREP, GENITAL
SPERM: NONE SEEN
TRICH WET PREP: NONE SEEN
Yeast Wet Prep HPF POC: NONE SEEN

## 2017-03-29 LAB — CBC WITH DIFFERENTIAL/PLATELET
BASOS ABS: 0 10*3/uL (ref 0–0.1)
Basophils Relative: 1 %
EOS ABS: 0.2 10*3/uL (ref 0–0.7)
EOS PCT: 3 %
HCT: 51.6 % — ABNORMAL HIGH (ref 35.0–47.0)
Hemoglobin: 16.8 g/dL — ABNORMAL HIGH (ref 12.0–16.0)
LYMPHS PCT: 34 %
Lymphs Abs: 2 10*3/uL (ref 1.0–3.6)
MCH: 26.7 pg (ref 26.0–34.0)
MCHC: 32.5 g/dL (ref 32.0–36.0)
MCV: 82.1 fL (ref 80.0–100.0)
Monocytes Absolute: 0.4 10*3/uL (ref 0.2–0.9)
Monocytes Relative: 6 %
Neutro Abs: 3.3 10*3/uL (ref 1.4–6.5)
Neutrophils Relative %: 56 %
PLATELETS: 212 10*3/uL (ref 150–440)
RBC: 6.29 MIL/uL — ABNORMAL HIGH (ref 3.80–5.20)
RDW: 14.1 % (ref 11.5–14.5)
WBC: 6 10*3/uL (ref 3.6–11.0)

## 2017-03-29 LAB — CHLAMYDIA/NGC RT PCR (ARMC ONLY)
Chlamydia Tr: NOT DETECTED
N gonorrhoeae: NOT DETECTED

## 2017-03-29 LAB — GLUCOSE, CAPILLARY
GLUCOSE-CAPILLARY: 442 mg/dL — AB (ref 65–99)
Glucose-Capillary: 375 mg/dL — ABNORMAL HIGH (ref 65–99)

## 2017-03-29 MED ORDER — INSULIN ASPART 100 UNIT/ML ~~LOC~~ SOLN
SUBCUTANEOUS | Status: AC
  Filled 2017-03-29: qty 1

## 2017-03-29 MED ORDER — INSULIN ASPART 100 UNIT/ML ~~LOC~~ SOLN
10.0000 [IU] | Freq: Once | SUBCUTANEOUS | Status: AC
  Administered 2017-03-29: 10 [IU] via SUBCUTANEOUS

## 2017-03-29 MED ORDER — METRONIDAZOLE 500 MG PO TABS: 500.0000 mg | ORAL_TABLET | Freq: Two times a day (BID) | ORAL | 0 refills | Status: DC

## 2017-03-29 MED ORDER — SODIUM CHLORIDE 0.9 % IV SOLN
Freq: Once | INTRAVENOUS | Status: AC
  Administered 2017-03-29: 17:00:00 via INTRAVENOUS

## 2017-03-29 MED ORDER — METFORMIN HCL 1000 MG PO TABS: 1000.0000 mg | ORAL_TABLET | Freq: Two times a day (BID) | ORAL | 11 refills | Status: DC

## 2017-03-29 NOTE — ED Provider Notes (Signed)
[facility] Emergency Department Provider Note       Time seen: ----------------------------------------- 3:41 PM on 03/29/2017 -----------------------------------------   I have reviewed the triage vital signs and the nursing notes.  HISTORY   Chief Complaint dry mouth and Vaginal Itching    HPI Michele [name] is a 52 y.o. female with a history of diabetes, hypertension and migraines who presents to the ED for multiple complaints.  Patient is complaining of dry mouth and polyuria but has also had some vaginal itching.  Initially she thought she had a yeast infection but the over-the-counter test was negative.  She is diabetic and has been taking her medication but does not check her blood sugar.  She is not sure how high her blood sugar is.  Past Medical History:  Diagnosis Date  . Diabetes mellitus without complication (HCC)   . Hypertension   . Migraines     There are no active problems to display for this patient.   Past Surgical History:  Procedure Laterality Date  . ABDOMINAL HYSTERECTOMY    . ANKLE SURGERY    . CESAREAN SECTION    . KNEE SURGERY      Allergies Patient has no known allergies.  Social History Social History   Tobacco Use  . Smoking status: Never Smoker  . Smokeless tobacco: Current User    Types: Snuff  Substance Use Topics  . Alcohol use: Yes    Comment: occasionally  . Drug use: No   Review of Systems Constitutional: Negative for fever. Cardiovascular: Negative for chest pain. Respiratory: Negative for shortness of breath. Gastrointestinal: Negative for abdominal pain, vomiting and diarrhea. Genitourinary: Positive for polyuria, vaginal itching Musculoskeletal: Negative for back pain. Skin: Negative for rash. Neurological: Negative for headaches, focal weakness or numbness.  All systems negative/normal/unremarkable except as stated in the HPI  ____________________________________________   PHYSICAL  EXAM:  VITAL SIGNS: ED Triage Vitals [03/29/17 1505]  Enc Vitals Group     BP (!) 141/100     Pulse Rate (!) 115     Resp 16     Temp 98.3 F (36.8 C)     Temp Source Oral     SpO2 100 %     Weight 247 lb (112 kg)     Height 5\' 6"  (1.676 m)     Head Circumference      Peak Flow      Pain Score      Pain Loc      Pain Edu?      Excl. in GC?     Constitutional: Alert and oriented. Well appearing and in no distress. Eyes: Conjunctivae are normal. Normal extraocular movements. ENT   Head: Normocephalic and atraumatic.   Nose: No congestion/rhinnorhea.   Mouth/Throat: Mucous membranes are moist.   Neck: No stridor. Cardiovascular: Normal rate, regular rhythm. No murmurs, rubs, or gallops. Respiratory: Normal respiratory effort without tachypnea nor retractions. Breath sounds are clear and equal bilaterally. No wheezes/rales/rhonchi. Gastrointestinal: Soft and nontender. Normal bowel sounds Genitourinary: White vaginal discharge is noted Musculoskeletal: Nontender with normal range of motion in extremities. No lower extremity tenderness nor edema. Neurologic:  Normal speech and language. No gross focal neurologic deficits are appreciated.  Skin:  Skin is warm, dry and intact. No rash noted. Psychiatric: Mood and affect are normal. Speech and behavior are normal.  ____________________________________________  ED COURSE:  As part of my medical decision making, I reviewed the following data within the electronic MEDICAL RECORD NUMBER  History obtained from family if available, nursing notes, old chart and ekg, as well as notes from prior ED visits. Patient presented for dry mouth and vaginal itching, we will assess with labs and reevaluate.   Procedures ____________________________________________   LABS (pertinent positives/negatives)  Labs Reviewed  WET PREP, GENITAL - Abnormal; Notable for the following components:      Result Value   Clue Cells Wet Prep HPF POC  PRESENT (*)    WBC, Wet Prep HPF POC FEW (*)    All other components within normal limits  URINALYSIS, COMPLETE (UACMP) WITH MICROSCOPIC - Abnormal; Notable for the following components:   Color, Urine YELLOW (*)    APPearance HAZY (*)    Specific Gravity, Urine 1.039 (*)    Glucose, UA >=500 (*)    Ketones, ur 20 (*)    Bacteria, UA RARE (*)    Squamous Epithelial / LPF 6-30 (*)    All other components within normal limits  CBC WITH DIFFERENTIAL/PLATELET - Abnormal; Notable for the following components:   RBC 6.29 (*)    Hemoglobin 16.8 (*)    HCT 51.6 (*)    All other components within normal limits  COMPREHENSIVE METABOLIC PANEL - Abnormal; Notable for the following components:   Chloride 95 (*)    Glucose, Bld 428 (*)    Creatinine, Ser 1.05 (*)    Anion gap 16 (*)    All other components within normal limits  GLUCOSE, CAPILLARY - Abnormal; Notable for the following components:   Glucose-Capillary 442 (*)    All other components within normal limits  GLUCOSE, CAPILLARY - Abnormal; Notable for the following components:   Glucose-Capillary 375 (*)    All other components within normal limits  CHLAMYDIA/NGC RT PCR ([facility] ONLY)  CBG MONITORING, ED  ____________________________________________  DIFFERENTIAL DIAGNOSIS   Hyperglycemia, dehydration, electrolyte abnormality, vaginitis, STD  FINAL ASSESSMENT AND PLAN  Hyperglycemia, bacterial vaginosis   Plan: Patient had presented for dry mouth and vaginal itching with polyuria. Patient's labs did reveal hyperglycemia and dehydration for which she was given IV fluids and insulin.  She also was found to have bacterial vaginosis.  She certainly does not appear to be in DKA and is better hydrated now.  She will be discharged with a higher dose of metformin as well as Flagyl and she is stable for outpatient follow-up with her primary care doctor.   Emily FilbertWilliams, Jemaine Prokop E, MD   Note: This note was generated in part or  whole with voice recognition software. Voice recognition is usually quite accurate but there are transcription errors that can and very often do occur. I apologize for any typographical errors that were not detected and corrected.     Emily FilbertWilliams, Aliese Brannum E, MD 03/29/17 [phone]

## 2017-03-29 NOTE — ED Notes (Signed)
Pt ambulatory to POV without difficulty. VSS. NAD. Discharge instructions, RX and follow up reviewed all questions answered.

## 2017-03-29 NOTE — ED Triage Notes (Signed)
Pt to ED via POV c/o dry mouth and vaginal itching x 1 week. Pt denies vaginal discharge. Pt states that she is urinating more than normal. Pt denies dysuria. Pt in NAD at this time.

## 2017-04-01 ENCOUNTER — Emergency Department: Payer: PRIVATE HEALTH INSURANCE

## 2017-04-01 ENCOUNTER — Other Ambulatory Visit: Payer: Self-pay

## 2017-04-01 ENCOUNTER — Encounter: Payer: Self-pay | Admitting: Radiology

## 2017-04-01 ENCOUNTER — Emergency Department
Admission: EM | Admit: 2017-04-01 | Discharge: 2017-04-01 | Disposition: A | Discharge status: Home / Self Care | Payer: PRIVATE HEALTH INSURANCE | Attending: Emergency Medicine | Admitting: Emergency Medicine

## 2017-04-01 DIAGNOSIS — E860 Dehydration: Secondary | ICD-10-CM | POA: Diagnosis not present

## 2017-04-01 DIAGNOSIS — E119 Type 2 diabetes mellitus without complications: Secondary | ICD-10-CM | POA: Diagnosis not present

## 2017-04-01 DIAGNOSIS — R1012 Left upper quadrant pain: Secondary | ICD-10-CM | POA: Diagnosis not present

## 2017-04-01 DIAGNOSIS — I10 Essential (primary) hypertension: Secondary | ICD-10-CM | POA: Insufficient documentation

## 2017-04-01 DIAGNOSIS — Z79899 Other long term (current) drug therapy: Secondary | ICD-10-CM | POA: Insufficient documentation

## 2017-04-01 DIAGNOSIS — R739 Hyperglycemia, unspecified: Secondary | ICD-10-CM

## 2017-04-01 DIAGNOSIS — Z7984 Long term (current) use of oral hypoglycemic drugs: Secondary | ICD-10-CM | POA: Insufficient documentation

## 2017-04-01 DIAGNOSIS — B349 Viral infection, unspecified: Secondary | ICD-10-CM | POA: Insufficient documentation

## 2017-04-01 LAB — GLUCOSE, CAPILLARY
GLUCOSE-CAPILLARY: 410 mg/dL — AB (ref 65–99)
Glucose-Capillary: 496 mg/dL — ABNORMAL HIGH (ref 65–99)
Glucose-Capillary: 339 mg/dL — ABNORMAL HIGH (ref 65–99)

## 2017-04-01 LAB — CBC
HCT: 48 % — ABNORMAL HIGH (ref 35.0–47.0)
Hemoglobin: 15.7 g/dL (ref 12.0–16.0)
MCH: 26.4 pg (ref 26.0–34.0)
MCHC: 32.6 g/dL (ref 32.0–36.0)
MCV: 81 fL (ref 80.0–100.0)
PLATELETS: 182 10*3/uL (ref 150–440)
RBC: 5.92 MIL/uL — AB (ref 3.80–5.20)
RDW: 13.7 % (ref 11.5–14.5)
WBC: 7.1 10*3/uL (ref 3.6–11.0)

## 2017-04-01 LAB — URINALYSIS, COMPLETE (UACMP) WITH MICROSCOPIC
Bacteria, UA: NONE SEEN
Bilirubin Urine: NEGATIVE
HGB URINE DIPSTICK: NEGATIVE
Ketones, ur: 20 mg/dL — AB
Leukocytes, UA: NEGATIVE
Nitrite: NEGATIVE
PROTEIN: NEGATIVE mg/dL
SPECIFIC GRAVITY, URINE: 1.028 (ref 1.005–1.030)
pH: 7 (ref 5.0–8.0)

## 2017-04-01 LAB — BASIC METABOLIC PANEL
Anion gap: 10 (ref 5–15)
BUN: 12 mg/dL (ref 6–20)
CALCIUM: 9.1 mg/dL (ref 8.9–10.3)
CHLORIDE: 95 mmol/L — AB (ref 101–111)
CO2: 25 mmol/L (ref 22–32)
CREATININE: 1.01 mg/dL — AB (ref 0.44–1.00)
GFR calc Af Amer: >60 mL/min (ref >60)
GFR calc non Af Amer: >60 mL/min (ref >60)
GLUCOSE: 493 mg/dL — AB (ref 65–99)
Potassium: 3.1 mmol/L — ABNORMAL LOW (ref 3.5–5.1)
Sodium: 130 mmol/L — ABNORMAL LOW (ref 135–145)

## 2017-04-01 MED ORDER — KETOROLAC TROMETHAMINE 30 MG/ML IJ SOLN
15.0000 mg | Freq: Once | INTRAMUSCULAR | Status: AC
Start: 2017-04-01 — End: 2017-04-01
  Administered 2017-04-01: 15 mg via INTRAVENOUS
  Filled 2017-04-01: qty 1

## 2017-04-01 MED ORDER — ONDANSETRON HCL 4 MG/2ML IJ SOLN
4.0000 mg | Freq: Once | INTRAMUSCULAR | Status: AC
Start: 2017-04-01 — End: 2017-04-01
  Administered 2017-04-01: 4 mg via INTRAVENOUS
  Filled 2017-04-01: qty 2

## 2017-04-01 MED ORDER — SODIUM CHLORIDE 0.9 % IV BOLUS (SEPSIS)
1000.0000 mL | Freq: Once | INTRAVENOUS | Status: AC
  Administered 2017-04-01: 1000 mL via INTRAVENOUS

## 2017-04-01 MED ORDER — IOPAMIDOL (ISOVUE-300) INJECTION 61%
100.0000 mL | Freq: Once | INTRAVENOUS | Status: AC | PRN
  Administered 2017-04-01: 100 mL via INTRAVENOUS

## 2017-04-01 MED ORDER — INSULIN ASPART 100 UNIT/ML ~~LOC~~ SOLN
6.0000 [IU] | Freq: Once | SUBCUTANEOUS | Status: AC
  Administered 2017-04-01: 6 [IU] via SUBCUTANEOUS
  Filled 2017-04-01: qty 1

## 2017-04-01 NOTE — Discharge Instructions (Signed)
Fortunately today your blood work and your CT scan were reassuring.  Please make sure you remain well-hydrated and follow-up with your primary care physician as needed.  Return to the emergency department for any concerns.  It was a pleasure to take care of you today, and thank you for coming to our emergency department.  If you have any questions or concerns before leaving please ask the nurse to grab me and I'm more than happy to go through your aftercare instructions again.  If you were prescribed any opioid pain medication today such as Norco, Vicodin, Percocet, morphine, hydrocodone, or oxycodone please make sure you do not drive when you are taking this medication as it can alter your ability to drive safely.  If you have any concerns once you are home that you are not improving or are in fact getting worse before you can make it to your follow-up appointment, please do not hesitate to call [phone] and come back for further evaluation.  Merrily BrittleNeil Margarete Horace, MD  Results for orders placed or performed during the hospital encounter of 04/01/17  Glucose, capillary  Result Value Ref Range   Glucose-Capillary 496 (H) 65 - 99 mg/dL  Basic metabolic panel  Result Value Ref Range   Sodium 130 (L) 135 - 145 mmol/L   Potassium 3.1 (L) 3.5 - 5.1 mmol/L   Chloride 95 (L) 101 - 111 mmol/L   CO2 25 22 - 32 mmol/L   Glucose, Bld 493 (H) 65 - 99 mg/dL   BUN 12 6 - 20 mg/dL   Creatinine, Ser 6.641.01 (H) 0.44 - 1.00 mg/dL   Calcium 9.1 8.9 - 40.310.3 mg/dL   GFR calc non Af Amer >60 >60 mL/min   GFR calc Af Amer >60 >60 mL/min   Anion gap 10 5 - 15  CBC  Result Value Ref Range   WBC 7.1 3.6 - 11.0 K/uL   RBC 5.92 (H) 3.80 - 5.20 MIL/uL   Hemoglobin 15.7 12.0 - 16.0 g/dL   HCT 47.448.0 (H) 25.935.0 - 56.347.0 %   MCV 81.0 80.0 - 100.0 fL   MCH 26.4 26.0 - 34.0 pg   MCHC 32.6 32.0 - 36.0 g/dL   RDW 87.513.7 64.311.5 - 32.914.5 %   Platelets 182 150 - 440 K/uL  Urinalysis, Complete w Microscopic  Result Value Ref Range   Color,  Urine YELLOW (A) YELLOW   APPearance HAZY (A) CLEAR   Specific Gravity, Urine 1.028 1.005 - 1.030   pH 7.0 5.0 - 8.0   Glucose, UA >=500 (A) NEGATIVE mg/dL   Hgb urine dipstick NEGATIVE NEGATIVE   Bilirubin Urine NEGATIVE NEGATIVE   Ketones, ur 20 (A) NEGATIVE mg/dL   Protein, ur NEGATIVE NEGATIVE mg/dL   Nitrite NEGATIVE NEGATIVE   Leukocytes, UA NEGATIVE NEGATIVE   RBC / HPF 0-5 0 - 5 RBC/hpf   WBC, UA 0-5 0 - 5 WBC/hpf   Bacteria, UA NONE SEEN NONE SEEN   Squamous Epithelial / LPF 0-5 (A) NONE SEEN   Dg Chest 2 View  Result Date: 04/01/2017 CLINICAL DATA:  Chest pain EXAM: CHEST  2 VIEW COMPARISON:  Chest radiograph 02/24/2017 FINDINGS: The heart size and mediastinal contours are within normal limits. Both lungs are clear. The visualized skeletal structures are unremarkable. IMPRESSION: No active cardiopulmonary disease. Electronically Signed   By: Deatra RobinsonKevin  Herman M.D.   On: 04/01/2017 02:38   Ct Abdomen Pelvis W Contrast  Result Date: 04/01/2017 CLINICAL DATA:  Abdominal distention.  Abdominal pain and  nausea. EXAM: CT ABDOMEN AND PELVIS WITH CONTRAST TECHNIQUE: Multidetector CT imaging of the abdomen and pelvis was performed using the standard protocol following bolus administration of intravenous contrast. CONTRAST:  ISOVUE-300 IOPAMIDOL (ISOVUE-300) INJECTION 61% COMPARISON:  CT abdomen pelvis 09/27/2016 FINDINGS: Lower chest: No basilar pulmonary nodules or pleural effusion. No apical pericardial effusion. Hepatobiliary: Normal hepatic contours and density. No visible biliary dilatation. Normal gallbladder. Pancreas: Normal parenchymal contours without ductal dilatation. No peripancreatic fluid collection. Spleen: Normal. Adrenals/Urinary Tract: --Adrenal glands: Normal. --Right kidney/ureter: No hydronephrosis, perinephric stranding or nephrolithiasis. No obstructing ureteral stones. --Left kidney/ureter: No hydronephrosis, perinephric stranding or nephrolithiasis. No obstructing  ureteral stones. --Urinary bladder: Normal appearance for the degree of distention. Stomach/Bowel: --Stomach/Duodenum: No hiatal hernia or other gastric abnormality. Normal duodenal course. --Small bowel: No dilatation or inflammation. --Colon: No focal abnormality. --Appendix: Normal. Vascular/Lymphatic: Normal course and caliber of the major abdominal vessels. No abdominal or pelvic lymphadenopathy. Reproductive: Normal uterus and ovaries. Musculoskeletal. No bony spinal canal stenosis or focal osseous abnormality. Other: None. IMPRESSION: No acute abdominopelvic abnormality. Electronically Signed   By: Deatra Robinson M.D.   On: 04/01/2017 04:21

## 2017-04-01 NOTE — ED Triage Notes (Signed)
Pt presents w/ hyperglycemia. Pt also c/o chest pain reproducible w/ inspiration, cough that prevents her from sleeping, abdominal pain, dizziness, nausea, vomiting x 2 today.

## 2017-04-01 NOTE — ED Notes (Signed)
Reviewed discharge instructions and follow-up care with patient. Patient verbalized understanding of all information reviewed. Patient stable, with no distress noted at this time.    

## 2017-04-01 NOTE — ED Notes (Signed)
ED Provider at bedside. 

## 2017-04-01 NOTE — ED Provider Notes (Signed)
[facility] Emergency Department Provider Note  ____________________________________________   First MD Initiated Contact with Patient 04/01/17 0210     (approximate)  I have reviewed the triage vital signs and the nursing notes.   HISTORY  Chief Complaint Hyperglycemia (multiple other complaints)    HPI Michele [name] is a 52 y.o. female presents to the emergency department with multiple complaints.  She has noted feeling "sick" for the past several days.  She has had a dry cough that is kept her from sleeping.  She also has some mild to moderate intermittent cramping left upper quadrant aching pain.  Associated with nausea and vomiting x2.  No diarrhea.  She checked her blood sugar today and it was high so she came to the emergency department.  Nothing seems to make her symptoms better or worse.  Past Medical History:  Diagnosis Date  . Diabetes mellitus without complication (HCC)   . Hypertension   . Migraines     There are no active problems to display for this patient.   Past Surgical History:  Procedure Laterality Date  . ABDOMINAL HYSTERECTOMY    . ANKLE SURGERY    . CESAREAN SECTION    . KNEE SURGERY      Prior to Admission medications   Medication Sig Start Date End Date Taking? Authorizing Provider  albuterol (PROVENTIL HFA;VENTOLIN HFA) 108 (90 BASE) MCG/ACT inhaler Inhale 2 puffs into the lungs every 6 (six) hours as needed for wheezing or shortness of breath. 08/26/14   Schaevitz, Myra Rude, MD  amitriptyline (ELAVIL) 50 MG tablet Take 50 mg by mouth at bedtime.    [provider]  baclofen (LIORESAL) 10 MG tablet Take 1 tablet (10 mg total) by mouth 3 (three) times daily. Patient not taking: Reported on 10/04/2016 06/14/15   Kem Boroughs B, FNP  cyclobenzaprine (FLEXERIL) 10 MG tablet Take 1 tablet (10 mg total) by mouth 3 (three) times daily as needed for muscle spasms. 10/04/16   Nita Sickle, MD  Elastic Bandages  & Supports (KNEE BRACE/HINGED BARS XL) MISC 1 Device by Does not apply route daily. 07/17/16   Triplett, Rulon Eisenmenger B, FNP  gabapentin (NEURONTIN) 100 MG capsule Take 1 capsule (100 mg total) by mouth 3 (three) times daily. 09/20/15 09/19/16  Phineas Semen, MD  HYDROcodone-acetaminophen (NORCO/VICODIN) 5-325 MG tablet Take 1 tablet by mouth every 6 (six) hours as needed for moderate pain. Patient not taking: Reported on 10/04/2016 09/28/16   Sharyn Creamer, MD  ibuprofen (ADVIL,MOTRIN) 600 MG tablet Take 1 tablet (600 mg total) by mouth every 6 (six) hours as needed. 10/04/16   Nita Sickle, MD  loratadine (CLARITIN) 10 MG tablet Take 10 mg by mouth daily.    [provider]  meloxicam (MOBIC) 15 MG tablet Take 1 tablet (15 mg total) by mouth daily. Patient not taking: Reported on 10/04/2016 07/17/16   Kem Boroughs B, FNP  metFORMIN (GLUCOPHAGE) 1000 MG tablet Take 1 tablet (1,000 mg total) by mouth 2 (two) times daily with a meal. 03/29/17 03/29/18  Emily Filbert, MD  metroNIDAZOLE (FLAGYL) 500 MG tablet Take 1 tablet (500 mg total) by mouth 2 (two) times daily. 03/29/17   Emily Filbert, MD  naproxen (NAPROSYN) 500 MG tablet Take 1 tablet (500 mg total) by mouth 2 (two) times daily with a meal. Patient not taking: Reported on 10/04/2016 07/01/15   Jene Every, MD  prochlorperazine (COMPAZINE) 10 MG tablet Take 1 tablet (10 mg total) by mouth  every 8 (eight) hours as needed (headache). Patient not taking: Reported on 10/04/2016 09/22/15   Phineas SemenGoodman, Graydon, MD  SUMAtriptan-naproxen (TREXIMET) 85-500 MG per tablet Take 1 tablet by mouth every 2 (two) hours as needed for migraine.    [provider]  topiramate (TOPAMAX) 100 MG tablet Take 100 mg by mouth 2 (two) times daily.    [provider]  triamterene-hydrochlorothiazide (MAXZIDE-25) 37.5-25 MG per tablet Take 1 tablet by mouth daily.    [provider]  verapamil (COVERA HS) 180 MG (CO) 24 hr tablet Take 180 mg  by mouth at bedtime.    [provider]    Allergies Patient has no known allergies.  Family History  Problem Relation Age of Onset  . Breast cancer Neg Hx     Social History Social History   Tobacco Use  . Smoking status: Never Smoker  . Smokeless tobacco: Current User    Types: Snuff  Substance Use Topics  . Alcohol use: Yes    Comment: occasionally  . Drug use: No    Review of Systems Constitutional: No fever/chills Eyes: No visual changes. ENT: No sore throat. Cardiovascular: Positive for chest pain. Respiratory: Denies shortness of breath. Gastrointestinal: Positive for abdominal pain.  Positive for nausea, positive for vomiting.  No diarrhea.  No constipation. Genitourinary: Negative for dysuria. Musculoskeletal: Negative for back pain. Skin: Negative for rash. Neurological: Negative for headaches, focal weakness or numbness.   ____________________________________________   PHYSICAL EXAM:  VITAL SIGNS: ED Triage Vitals  Enc Vitals Group     BP 04/01/17 0139 127/90     Pulse Rate 04/01/17 0139 88     Resp 04/01/17 0139 20     Temp 04/01/17 0139 98 F (36.7 C)     Temp Source 04/01/17 0139 Oral     SpO2 04/01/17 0139 97 %     Weight 04/01/17 0142 240 lb 15.4 oz (109.3 kg)     Height 04/01/17 0142 5\' 6"  (1.676 m)     Head Circumference --      Peak Flow --      Pain Score 04/01/17 0153 8     Pain Loc --      Pain Edu? --      Excl. in GC? --     Constitutional: Alert and oriented x4 well-appearing nontoxic no diaphoresis speaks full clear sentences Eyes: PERRL EOMI. Head: Atraumatic. Nose: No congestion/rhinnorhea. Mouth/Throat: No trismus Neck: No stridor.   Cardiovascular: Normal rate, regular rhythm. Grossly normal heart sounds.  Good peripheral circulation. Respiratory: Normal respiratory effort.  No retractions. Lungs CTAB and moving good air Gastrointestinal: Obese soft mild upper abdominal tenderness with no rebound or guarding  no peritonitis Musculoskeletal: No lower extremity edema   Neurologic:  Normal speech and language. No gross focal neurologic deficits are appreciated. Skin:  Skin is warm, dry and intact. No rash noted. Psychiatric: Mood and affect are normal. Speech and behavior are normal.    ____________________________________________   DIFFERENTIAL includes but not limited to  Gastritis, biliary colic, cholecystitis, appendicitis, diverticulitis, DKA, HHS, viral syndrome ____________________________________________   LABS (all labs ordered are listed, but only abnormal results are displayed)  Labs Reviewed  GLUCOSE, CAPILLARY - Abnormal; Notable for the following components:      Result Value   Glucose-Capillary 496 (*)    All other components within normal limits  BASIC METABOLIC PANEL - Abnormal; Notable for the following components:   Sodium 130 (*)    Potassium 3.1 (*)  Chloride 95 (*)    Glucose, Bld 493 (*)    Creatinine, Ser 1.01 (*)    All other components within normal limits  CBC - Abnormal; Notable for the following components:   RBC 5.92 (*)    HCT 48.0 (*)    All other components within normal limits  URINALYSIS, COMPLETE (UACMP) WITH MICROSCOPIC - Abnormal; Notable for the following components:   Color, Urine YELLOW (*)    APPearance HAZY (*)    Glucose, UA >=500 (*)    Ketones, ur 20 (*)    Squamous Epithelial / LPF 0-5 (*)    All other components within normal limits  CBG MONITORING, ED    Lab work reviewed by me shows elevated glucose but no evidence of DKA __________________________________________  EKG  ED ECG REPORT I, Merrily Brittle, the attending physician, personally viewed and interpreted this ECG.  Date: 04/01/2017 EKG Time:  Rate: 83 Rhythm: normal sinus rhythm QRS Axis: normal Intervals: normal ST/T Wave abnormalities: normal Narrative Interpretation: no evidence of acute ischemia ________________________________________  RADIOLOGY  CT  abdomen pelvis reviewed by me with no acute disease ____________________________________________   PROCEDURES  Procedure(s) performed: no  Procedures  Critical Care performed: no  Observation: no ____________________________________________   INITIAL IMPRESSION / ASSESSMENT AND PLAN / ED COURSE  Pertinent labs & imaging results that were available during my care of the patient were reviewed by me and considered in my medical decision making (see chart for details).  The patient arrives significantly hyperglycemic although hemodynamically stable.  She does have some abdominal discomfort and tenderness.  CT scan is pending.    ----------------------------------------- 4:34 AM on 04/01/2017 -----------------------------------------  The patient CT scan was fortunately quite reassuring.  She is able to keep food down.  Her vital signs have normalized.  She feels improved after fluids.  She likely has a viral syndrome contributing to stress reaction elevating her blood sugar.  Strict return precautions have been given and the patient verbalizes understanding and agreement with the plan. ____________________________________________   FINAL CLINICAL IMPRESSION(S) / ED DIAGNOSES  Final diagnoses:  Viral syndrome  Hyperglycemia  Dehydration      NEW MEDICATIONS STARTED DURING THIS VISIT:  New Prescriptions   No medications on file     Note:  This document was prepared using Dragon voice recognition software and may include unintentional dictation errors.     Merrily Brittle, MD 04/01/17 [phone]

## 2017-04-03 ENCOUNTER — Emergency Department
Admission: EM | Admit: 2017-04-03 | Discharge: 2017-04-03 | Disposition: A | Discharge status: Home / Self Care | Payer: PRIVATE HEALTH INSURANCE | Attending: Emergency Medicine | Admitting: Emergency Medicine

## 2017-04-03 ENCOUNTER — Encounter: Payer: Self-pay | Admitting: *Deleted

## 2017-04-03 ENCOUNTER — Other Ambulatory Visit: Payer: Self-pay

## 2017-04-03 DIAGNOSIS — E1165 Type 2 diabetes mellitus with hyperglycemia: Secondary | ICD-10-CM | POA: Diagnosis present

## 2017-04-03 DIAGNOSIS — R739 Hyperglycemia, unspecified: Secondary | ICD-10-CM

## 2017-04-03 DIAGNOSIS — Z79899 Other long term (current) drug therapy: Secondary | ICD-10-CM | POA: Diagnosis not present

## 2017-04-03 DIAGNOSIS — I10 Essential (primary) hypertension: Secondary | ICD-10-CM | POA: Diagnosis not present

## 2017-04-03 DIAGNOSIS — Z7984 Long term (current) use of oral hypoglycemic drugs: Secondary | ICD-10-CM | POA: Insufficient documentation

## 2017-04-03 LAB — CBC
HEMATOCRIT: 47.3 % — AB (ref 35.0–47.0)
Hemoglobin: 15.3 g/dL (ref 12.0–16.0)
MCH: 26.6 pg (ref 26.0–34.0)
MCHC: 32.4 g/dL (ref 32.0–36.0)
MCV: 82 fL (ref 80.0–100.0)
Platelets: 200 10*3/uL (ref 150–440)
RBC: 5.77 MIL/uL — ABNORMAL HIGH (ref 3.80–5.20)
RDW: 13.9 % (ref 11.5–14.5)
WBC: 5.3 10*3/uL (ref 3.6–11.0)

## 2017-04-03 LAB — HEPATIC FUNCTION PANEL
ALBUMIN: 3.9 g/dL (ref 3.5–5.0)
ALT: 52 U/L (ref 14–54)
AST: 70 U/L — ABNORMAL HIGH (ref 15–41)
Alkaline Phosphatase: 76 U/L (ref 38–126)
BILIRUBIN TOTAL: 0.8 mg/dL (ref 0.3–1.2)
Bilirubin, Direct: 0.1 mg/dL (ref 0.1–0.5)
Indirect Bilirubin: 0.7 mg/dL (ref 0.3–0.9)
Total Protein: 6.5 g/dL (ref 6.5–8.1)

## 2017-04-03 LAB — URINALYSIS, COMPLETE (UACMP) WITH MICROSCOPIC
BACTERIA UA: NONE SEEN
Bilirubin Urine: NEGATIVE
Glucose, UA: >=500 mg/dL — AB
Hgb urine dipstick: NEGATIVE
KETONES UR: NEGATIVE mg/dL
LEUKOCYTES UA: NEGATIVE
Nitrite: NEGATIVE
Protein, ur: NEGATIVE mg/dL
SPECIFIC GRAVITY, URINE: 1.029 (ref 1.005–1.030)
pH: 6 (ref 5.0–8.0)

## 2017-04-03 LAB — GLUCOSE, CAPILLARY
GLUCOSE-CAPILLARY: 313 mg/dL — AB (ref 65–99)
Glucose-Capillary: 498 mg/dL — ABNORMAL HIGH (ref 65–99)
Glucose-Capillary: 454 mg/dL — ABNORMAL HIGH (ref 65–99)
Glucose-Capillary: 227 mg/dL — ABNORMAL HIGH (ref 65–99)

## 2017-04-03 LAB — BASIC METABOLIC PANEL
Anion gap: 12 (ref 5–15)
BUN: 12 mg/dL (ref 6–20)
CHLORIDE: 97 mmol/L — AB (ref 101–111)
CO2: 23 mmol/L (ref 22–32)
Calcium: 9.4 mg/dL (ref 8.9–10.3)
Creatinine, Ser: 1.06 mg/dL — ABNORMAL HIGH (ref 0.44–1.00)
GFR calc Af Amer: >60 mL/min (ref >60)
GFR calc non Af Amer: >60 mL/min (ref >60)
GLUCOSE: 530 mg/dL — AB (ref 65–99)
POTASSIUM: 3.5 mmol/L (ref 3.5–5.1)
Sodium: 132 mmol/L — ABNORMAL LOW (ref 135–145)

## 2017-04-03 LAB — LIPASE, BLOOD: LIPASE: 27 U/L (ref 11–51)

## 2017-04-03 LAB — TROPONIN I

## 2017-04-03 MED ORDER — INSULIN ASPART 100 UNIT/ML ~~LOC~~ SOLN
10.0000 [IU] | Freq: Once | SUBCUTANEOUS | Status: AC
  Administered 2017-04-03: 10 [IU] via INTRAVENOUS
  Filled 2017-04-03: qty 1

## 2017-04-03 MED ORDER — SODIUM CHLORIDE 0.9 % IV BOLUS (SEPSIS)
1000.0000 mL | Freq: Once | INTRAVENOUS | Status: AC
  Administered 2017-04-03: 1000 mL via INTRAVENOUS

## 2017-04-03 MED ORDER — GI COCKTAIL ~~LOC~~
30.0000 mL | Freq: Once | ORAL | Status: AC
  Administered 2017-04-03: 30 mL via ORAL
  Filled 2017-04-03: qty 30

## 2017-04-03 NOTE — ED Provider Notes (Signed)
[facility] Emergency Department Provider Note  ____________________________________________  Time seen: Approximately 9:13 PM  I have reviewed the triage vital signs and the nursing notes.   HISTORY  Chief Complaint Hyperglycemia   HPI Michele [name] is a 52 y.o. female with a history of diabetes, hypertension, migraine headaches and presents for evaluation of hyperglycemia. This is patient's third visit to the emergency room in the last 5 days for similar complaints. Initially she had a viral syndrome. She saw her primary care doctor yesterday and he added to her metformin and glipizide. Today is the second morning that she took the glipizide. She woke up this morning feeling dizzy and checked her sugar which was again 485. Patient denies headache, stroke symptoms, fever, chills, neck stiffness, cough, congestion, runny nose, sore throat, chest pain. She does have burning sensation in her epigastric region which has been constant for 4 days. No vomiting or diarrhea. No dysuria or hematuria.  Chief Complaint: hyperglycemia Severity: moderate Duration: 5 days Modifying factors: increased metformin and adding glipizide did not improve Associated signs/symptoms: burning epigastric pain, dizziness    Past Medical History:  Diagnosis Date  . Diabetes mellitus without complication (HCC)   . Hypertension   . Migraines     There are no active problems to display for this patient.   Past Surgical History:  Procedure Laterality Date  . ABDOMINAL HYSTERECTOMY    . ANKLE SURGERY    . CESAREAN SECTION    . KNEE SURGERY      Prior to Admission medications   Medication Sig Start Date End Date Taking? Authorizing Provider  albuterol (PROVENTIL HFA;VENTOLIN HFA) 108 (90 BASE) MCG/ACT inhaler Inhale 2 puffs into the lungs every 6 (six) hours as needed for wheezing or shortness of breath. 08/26/14  Yes Schaevitz, Myra Rudeavid Matthew, MD  amitriptyline (ELAVIL) 50 MG  tablet Take 50 mg by mouth at bedtime.   Yes [provider]  glipiZIDE (GLUCOTROL) 5 MG tablet Take 5 mg by mouth daily before breakfast.   Yes [provider]  ibuprofen (ADVIL,MOTRIN) 600 MG tablet Take 1 tablet (600 mg total) by mouth every 6 (six) hours as needed. 10/04/16  Yes Don PerkingVeronese, [place], MD  metFORMIN (GLUCOPHAGE) 1000 MG tablet Take 1 tablet (1,000 mg total) by mouth 2 (two) times daily with a meal. 03/29/17 03/29/18 Yes Emily FilbertWilliams, Jonathan E, MD  metroNIDAZOLE (FLAGYL) 500 MG tablet Take 1 tablet (500 mg total) by mouth 2 (two) times daily. 03/29/17  Yes Emily FilbertWilliams, Jonathan E, MD  SUMAtriptan-naproxen (TREXIMET) 85-500 MG per tablet Take 1 tablet by mouth every 2 (two) hours as needed for migraine.   Yes [provider]  topiramate (TOPAMAX) 100 MG tablet Take 100 mg by mouth 2 (two) times daily.   Yes [provider]  triamterene-hydrochlorothiazide (MAXZIDE-25) 37.5-25 MG per tablet Take 1 tablet by mouth daily.   Yes [provider]  verapamil (COVERA HS) 180 MG (CO) 24 hr tablet Take 180 mg by mouth at bedtime.   Yes [provider]  baclofen (LIORESAL) 10 MG tablet Take 1 tablet (10 mg total) by mouth 3 (three) times daily. Patient not taking: Reported on 10/04/2016 06/14/15   Kem Boroughsriplett, Cari B, FNP  cyclobenzaprine (FLEXERIL) 10 MG tablet Take 1 tablet (10 mg total) by mouth 3 (three) times daily as needed for muscle spasms. Patient not taking: Reported on 04/03/2017 10/04/16   Nita SickleVeronese, [place], MD  Elastic Bandages & Supports (KNEE BRACE/HINGED BARS XL) MISC 1 Device  by Does not apply route daily. 07/17/16   Triplett, Rulon Eisenmenger B, FNP  gabapentin (NEURONTIN) 100 MG capsule Take 1 capsule (100 mg total) by mouth 3 (three) times daily. 09/20/15 09/19/16  Phineas Semen, MD  HYDROcodone-acetaminophen (NORCO/VICODIN) 5-325 MG tablet Take 1 tablet by mouth every 6 (six) hours as needed for moderate pain. Patient not taking: Reported on 10/04/2016  09/28/16   Sharyn Creamer, MD  meloxicam (MOBIC) 15 MG tablet Take 1 tablet (15 mg total) by mouth daily. Patient not taking: Reported on 10/04/2016 07/17/16   Kem Boroughs B, FNP  naproxen (NAPROSYN) 500 MG tablet Take 1 tablet (500 mg total) by mouth 2 (two) times daily with a meal. Patient not taking: Reported on 10/04/2016 07/01/15   Jene Every, MD  prochlorperazine (COMPAZINE) 10 MG tablet Take 1 tablet (10 mg total) by mouth every 8 (eight) hours as needed (headache). Patient not taking: Reported on 10/04/2016 09/22/15   Phineas Semen, MD    Allergies Patient has no known allergies.  Family History  Problem Relation Age of Onset  . Breast cancer Neg Hx     Social History Social History   Tobacco Use  . Smoking status: Never Smoker  . Smokeless tobacco: Current User    Types: Snuff  Substance Use Topics  . Alcohol use: Yes    Comment: occasionally  . Drug use: No    Review of Systems  Constitutional: Negative for fever. + dizziness Eyes: Negative for visual changes. ENT: Negative for sore throat. Neck: No neck pain  Cardiovascular: Negative for chest pain. Respiratory: Negative for shortness of breath. Gastrointestinal: Negative for abdominal pain, vomiting or diarrhea. + epigastric burning Genitourinary: Negative for dysuria. Musculoskeletal: Negative for back pain. Skin: Negative for rash. Neurological: Negative for headaches, weakness or numbness. Psych: No SI or HI  ____________________________________________   PHYSICAL EXAM:  VITAL SIGNS: ED Triage Vitals  Enc Vitals Group     BP 04/03/17 1743 115/87     Pulse Rate 04/03/17 1743 98     Resp 04/03/17 1743 16     Temp 04/03/17 1743 98 F (36.7 C)     Temp Source 04/03/17 1743 Oral     SpO2 04/03/17 1743 98 %     Weight 04/03/17 1744 240 lb (108.9 kg)     Height 04/03/17 1744 5\' 6"  (1.676 m)     Head Circumference --      Peak Flow --      Pain Score --      Pain Loc --      Pain Edu? --       Excl. in GC? --     Constitutional: Alert and oriented. Well appearing and in no apparent distress. HEENT:      Head: Normocephalic and atraumatic.         Eyes: Conjunctivae are normal. Sclera is non-icteric.       Mouth/Throat: Mucous membranes are moist.       Neck: Supple with no signs of meningismus. Cardiovascular: Regular rate and rhythm. No murmurs, gallops, or rubs. 2+ symmetrical distal pulses are present in all extremities. No JVD. Respiratory: Normal respiratory effort. Lungs are clear to auscultation bilaterally. No wheezes, crackles, or rhonchi.  Gastrointestinal: Soft, non tender, and non distended with positive bowel sounds. No rebound or guarding. Genitourinary: No CVA tenderness. Musculoskeletal: Nontender with normal range of motion in all extremities. No edema, cyanosis, or erythema of extremities. Neurologic: Normal speech and language. A & O x3, PERRL, EOMI,  no nystagmus, CN II-XII intact, motor testing reveals good tone and bulk throughout. There is no evidence of pronator drift or dysmetria. Muscle strength is 5/5 throughout.  Sensory examination is intact. Gait is normal. Skin: Skin is warm, dry and intact. No rash noted. Psychiatric: Mood and affect are normal. Speech and behavior are normal.  ____________________________________________   LABS (all labs ordered are listed, but only abnormal results are displayed)  Labs Reviewed  GLUCOSE, CAPILLARY - Abnormal; Notable for the following components:      Result Value   Glucose-Capillary 498 (*)    All other components within normal limits  BASIC METABOLIC PANEL - Abnormal; Notable for the following components:   Sodium 132 (*)    Chloride 97 (*)    Glucose, Bld 530 (*)    Creatinine, Ser 1.06 (*)    All other components within normal limits  CBC - Abnormal; Notable for the following components:   RBC 5.77 (*)    HCT 47.3 (*)    All other components within normal limits  URINALYSIS, COMPLETE (UACMP) WITH  MICROSCOPIC - Abnormal; Notable for the following components:   Color, Urine YELLOW (*)    APPearance HAZY (*)    Glucose, UA >=500 (*)    Squamous Epithelial / LPF 0-5 (*)    All other components within normal limits  GLUCOSE, CAPILLARY - Abnormal; Notable for the following components:   Glucose-Capillary 454 (*)    All other components within normal limits  GLUCOSE, CAPILLARY - Abnormal; Notable for the following components:   Glucose-Capillary 313 (*)    All other components within normal limits  HEPATIC FUNCTION PANEL - Abnormal; Notable for the following components:   AST 70 (*)    All other components within normal limits  GLUCOSE, CAPILLARY - Abnormal; Notable for the following components:   Glucose-Capillary 227 (*)    All other components within normal limits  LIPASE, BLOOD  TROPONIN I  CBG MONITORING, ED   ____________________________________________  EKG  ED ECG REPORT I, Nita Sickle, the attending physician, personally viewed and interpreted this ECG.  Normal sinus rhythm, rate of 66, first-degree AV block, normal QRS and QTc intervals, normal axis, no ST elevations or depressions.  ____________________________________________  RADIOLOGY  none  ____________________________________________   PROCEDURES  Procedure(s) performed: None Procedures Critical Care performed:  None ____________________________________________   INITIAL IMPRESSION / ASSESSMENT AND PLAN / ED COURSE   52 y.o. female with a history of diabetes, hypertension, migraine headaches and presents for evaluation of hyperglycemia. this is patient's third episode in the last 5 days. She saw her primary care doctor yesterday who started her on glipizide on top of her metformin. Her only other complaint is epigastric burning pain which she's had for 4 days. She was seen here 2 days ago with similar complaint and had a chest x-ray and CT abdomen and pelvis which was negative. I'll get an EKG  and cardiac enzymes to make sure this is not ACS. Her labs otherwise showing hyperglycemia with no evidence of DKA. We'll give IV fluids and IV insulin. We'll give her a GI cocktail. Remaining of her labs are unchanged from prior. We'll add on LFTs and lipase.    _________________________ 11:20 PM on 04/03/2017 -----------------------------------------  slightly elevated AST with a normal ALT, alkaline phosphatase, T bili, lipase, and white cell. Unclear etiology. Sugar is now 227. patient feels markedly improved. EKG and troponin with no evidence of ischemia. the patient is stable for discharge home. Recommended  that she calls her primary care doctor in the morning for further evaluation. Discussed that she might need to be on insulin but I would defer that to her primary care doctor at this time. Discussed return precautions for any signs or symptoms of DKA. Patient is comfortable with the plan and follow-up.   As part of my medical decision making, I reviewed the following data within the electronic MEDICAL RECORD NUMBER Nursing notes reviewed and incorporated, Labs reviewed , EKG interpreted , Old EKG reviewed, Notes from prior ED visits and [place] Controlled Substance Database    Pertinent labs & imaging results that were available during my care of the patient were reviewed by me and considered in my medical decision making (see chart for details).    ____________________________________________   FINAL CLINICAL IMPRESSION(S) / ED DIAGNOSES  Final diagnoses:  Hyperglycemia      NEW MEDICATIONS STARTED DURING THIS VISIT:  ED Discharge Orders    None       Note:  This document was prepared using Dragon voice recognition software and may include unintentional dictation errors.    Nita Sickle, MD 04/03/17 2322

## 2017-04-03 NOTE — ED Notes (Signed)
Spoke with Dr. Don PerkingVeronese in regards to patient who had CBG of 530 and after 1 bolus it has only decreased to 454.  Per Dr. Don PerkingVeronese with verbal order placed, patient is to receive another 1L bolus NS and 10 units IV insulin.  See MAR.

## 2017-04-03 NOTE — ED Triage Notes (Signed)
PT to ED reporting hyperglycemia for the past week. This will mark the third time this week pt has been seen in ED for elevated glucose. Pt reports she has been taking medication as prescribed and even increased doses as told in the ED on the 28th without improvement. Pt reports she has been feeling weak and dizzy with "my head feeling as though it is spinning." Pt reports she had been urinating more than normal but is no longer. Pt denies ever being in DKA before.

## 2017-05-13 ENCOUNTER — Ambulatory Visit: Payer: No Typology Code available for payment source | Attending: Orthopedic Surgery | Admitting: Occupational Therapy

## 2017-05-13 ENCOUNTER — Other Ambulatory Visit: Payer: Self-pay

## 2017-05-13 ENCOUNTER — Encounter: Payer: Self-pay | Admitting: Occupational Therapy

## 2017-05-13 DIAGNOSIS — M25631 Stiffness of right wrist, not elsewhere classified: Secondary | ICD-10-CM | POA: Diagnosis present

## 2017-05-13 DIAGNOSIS — M6281 Muscle weakness (generalized): Secondary | ICD-10-CM

## 2017-05-13 DIAGNOSIS — M25531 Pain in right wrist: Secondary | ICD-10-CM | POA: Insufficient documentation

## 2017-05-13 NOTE — Patient Instructions (Signed)
Custom thumb spica made over thumb /wrist neoprene wrap - to immobilize wrist and thumb except IP of thumb '  Take off splint for ADL's and 2 x day for AROM wrist in all planes and thumb opposition  Pain free

## 2017-05-13 NOTE — Therapy (Signed)
[facility] [address] [place], [place], [address] Phone: [phone]   Fax:  [phone]  Occupational Therapy Evaluation  Patient Details  Name: Michele [name] MRN: [id number] Date of Birth: 1965/12/16 Referring Provider: Janee Mornhompson   Encounter Date: 05/13/2017  OT End of Session - 05/13/17 1947    Visit Number  1    Number of Visits  12    Date for OT Re-Evaluation  06/24/17    OT Start Time  1509    OT Stop Time  1604    OT Time Calculation (min)  55 min    Activity Tolerance  Patient tolerated treatment well    Behavior During Therapy  [facility] for tasks assessed/performed       Past Medical History:  Diagnosis Date  . Diabetes mellitus without complication (HCC)   . Hypertension   . Migraines     Past Surgical History:  Procedure Laterality Date  . ABDOMINAL HYSTERECTOMY    . ANKLE SURGERY    . CESAREAN SECTION    . KNEE SURGERY      There were no vitals filed for this visit.  Subjective Assessment - 05/13/17 1904    Subjective   I had my injury in Oct and did not work yet - they do not have light duty under 5 lbs - I was on and off in splints - and the Dr gave me 2 shots in Dec and Jan but then my blood sugar went really high - and still have pain - so they send me to therapy     Patient Stated Goals  I want to go back to work - I have work since I was 52 yrs old - and I have been working there  for about 13 yrs     Currently in Pain?  Yes    Pain Score  10-Worst pain ever    Pain Location  Wrist    Pain Orientation  Right    Pain Descriptors / Indicators  Stabbing;Shooting    Pain Onset  More than a month ago    Aggravating Factors   with wrist AROM , thumb RA         [facility] OT Assessment - 05/13/17 0001      Assessment   Medical Diagnosis  R deQuervain and flexor tendinitis     Referring Provider  Janee Mornhompson    Onset Date/Surgical Date  12/27/16    Hand Dominance  Right    Next MD Visit   -- per pt later this week       Home  Environment   Lives With  Family      Prior Function   Vocation  -- did work full time prior to injury     Leisure  watch tv, some games on phone ,and play with 5 yrs old grand son       AROM   Right Wrist Extension  60 Degrees 10/10 pain     Right Wrist Flexion  70 Degrees 10/10 pain     Right Wrist Radial Deviation  33 Degrees    Right Wrist Ulnar Deviation  33 Degrees pain 6/10      Right Hand AROM   R Thumb Radial ABduction/ADduction 0-55  -- pain resistance end range     R Thumb Opposition to Index  -- no pain with oppositoin to base of 5th         Custom thumb  spica made over thumb /wrist neoprene wrap - to immobilize wrist and thumb except IP of thumb '  Take off splint for ADL's and 2 x day for AROM wrist in all planes and thumb opposition  Pain free  ]  And then ionto with dexamethazone done med patch over 1st dorsal compartment on R hand at 2.0 current , and 19 min  Skin check done prior and at end                  OT Education - 05/13/17 1946    Education provided  Yes    Education Details  findings of eval and HEP - splint wearing     Person(s) Educated  Patient    Methods  Explanation;Demonstration;Tactile cues    Comprehension  Verbalized understanding          OT Long Term Goals - 05/13/17 2019      OT LONG TERM GOAL #1   Title  Pain in R wrist improve with more than 20 points on PRWHE     Baseline  pain on PRWHE at eval 48/50     Time  4    Period  Weeks    Status  New    Target Date  06/10/17      OT LONG TERM GOAL #2   Title  Pt to be independent in HEP of wearing splint, AROM and modalities to decrease pain to less than 5/10     Baseline  pain 8-10/10     Time  3    Period  Weeks    Status  New    Target Date  06/03/17      OT LONG TERM GOAL #3   Title  Pain decrease for pt to be able to show increase AROM in R wrist  and use of R hand in ADL's without increase symptoms     Baseline   decrease wrist ROM in all planes and pain 6-10/10     Time  6    Period  Weeks    Status  New    Target Date  06/24/17            Plan - 05/13/17 2011    Clinical Impression Statement  Pt present at OT eval 5 months out from injury - diagnosis of R dequervain and flexor tendonitis - pt arrive with no splint - took it of the am - pt cont to have pain 8-10 to 10/10 with AROM for wrist , and tenderness over 1st dorsal compartment , and Positive finkelstein - pt did had 2 shots in Dec and Jan - but then was diagnose with diabetes - pt was fabricate a custom thumb spica this date and to wear all the time and off only for ADL's and AROM pain free 2 x day - pt report if she holds her wrist tight she can do AROM pain free - ionto with dexamethazone was initated this date - pain and decrease ROM and strength - limiting her functional use of R dominant hand     Occupational performance deficits (Please refer to evaluation for details):  ADL's;IADL's;Work;Play;Leisure    Rehab Potential  Fair    Current Impairments/barriers affecting progress:  injury since Oct 18     OT Frequency  2x / week    OT Duration  6 weeks    OT Treatment/Interventions  Self-care/ADL training;Therapeutic exercise;Patient/family education;Splinting;Fluidtherapy;Manual Therapy;Iontophoresis    Plan  assess progress with custom thumb spica  and ionto started     Clinical Decision Making  Multiple treatment options, significant modification of task necessary    OT Home Exercise Plan  see pt instruction     Consulted and Agree with Plan of Care  Patient       Patient will benefit from skilled therapeutic intervention in order to improve the following deficits and impairments:  Pain, Impaired flexibility, Decreased range of motion, Impaired UE functional use, Decreased strength  Visit Diagnosis: Pain in right wrist - Plan: Ot plan of care cert/re-cert  Stiffness of right wrist, not elsewhere classified - Plan: Ot plan of care  cert/re-cert  Muscle weakness (generalized) - Plan: Ot plan of care cert/re-cert    Problem List There are no active problems to display for this patient.   Oletta Cohn OTR/L,CLT 05/13/2017, 8:24 PM  [facility] [address], [place], [address] Phone: [phone]   Fax:  [phone]  Name: Nandika Stetzer MRN: [id number] Date of Birth: 05/23/1965

## 2017-05-15 ENCOUNTER — Ambulatory Visit: Payer: No Typology Code available for payment source | Admitting: Occupational Therapy

## 2017-05-15 DIAGNOSIS — M25631 Stiffness of right wrist, not elsewhere classified: Secondary | ICD-10-CM

## 2017-05-15 DIAGNOSIS — M6281 Muscle weakness (generalized): Secondary | ICD-10-CM

## 2017-05-15 DIAGNOSIS — M25531 Pain in right wrist: Secondary | ICD-10-CM | POA: Diagnosis not present

## 2017-05-15 NOTE — Therapy (Signed)
[facility] [address], [place], [address] Phone: [phone]   Fax:  [phone]  Occupational Therapy Treatment  Patient Details  Name: Michele [name] MRN: [id number] Date of Birth: 07/18/1965 Referring Provider: Janee Morn   Encounter Date: 05/15/2017  OT End of Session - 05/15/17 1950    Visit Number  2    Number of Visits  12    Date for OT Re-Evaluation  06/24/17    OT Start Time  1416    OT Stop Time  1445    OT Time Calculation (min)  29 min    Activity Tolerance  Patient tolerated treatment well    Behavior During Therapy  [facility] for tasks assessed/performed       Past Medical History:  Diagnosis Date  . Diabetes mellitus without complication (HCC)   . Hypertension   . Migraines     Past Surgical History:  Procedure Laterality Date  . ABDOMINAL HYSTERECTOMY    . ANKLE SURGERY    . CESAREAN SECTION    . KNEE SURGERY      There were no vitals filed for this visit.  Subjective Assessment - 05/15/17 1947    Subjective   I did wear this splint you made 2 days ago - I did not wear the other splints as I should have - because they were not comfortable - but this one I did - I am seeing MD tomorrow     Patient Stated Goals  I want to go back to work - I have work since I was 52 yrs old - and I have been working there  for about 13 yrs     Currently in Pain?  Yes    Pain Score  5     Pain Location  Wrist    Pain Orientation  Right    Pain Descriptors / Indicators  Aching;Tender       Still tender on R distal radius head  But coming in with thumb spica - custom splint   report she is wearing -  And doing painfree AROM             skin check done prior   OT Treatments/Exercises (OP) - 05/15/17 0001      Iontophoresis   Type of Iontophoresis  Dexamethasone    Location  1st dorsal compartments R wrist     Dose  med patch and 2.0 current     Time  19 min       pt to wear  patch for hour afterwards        OT Education - 05/15/17 1950    Education provided  Yes    Education Details  ionto and to keep patch on for hour afterwards and splint wearing     Person(s) Educated  Patient    Methods  Explanation;Demonstration;Tactile cues    Comprehension  Verbalized understanding          OT Long Term Goals - 05/13/17 2019      OT LONG TERM GOAL #1   Title  Pain in R wrist improve with more than 20 points on PRWHE     Baseline  pain on PRWHE at eval 48/50     Time  4    Period  Weeks    Status  New    Target Date  06/10/17      OT LONG TERM GOAL #2   Title  Pt  to be independent in HEP of wearing splint, AROM and modalities to decrease pain to less than 5/10     Baseline  pain 8-10/10     Time  3    Period  Weeks    Status  New    Target Date  06/03/17      OT LONG TERM GOAL #3   Title  Pain decrease for pt to be able to show increase AROM in R wrist  and use of R hand in ADL's without increase symptoms     Baseline  decrease wrist ROM in all planes and pain 6-10/10     Time  6    Period  Weeks    Status  New    Target Date  06/24/17            Plan - 05/15/17 1951    Clinical Impression Statement  Pt was seen for 1 tx session this date - ionto done and pt to cont with custom thumb spica and keep ionto patch for hour after session and do AROM pain free 2 x day     Occupational performance deficits (Please refer to evaluation for details):  ADL's;IADL's;Work;Play;Leisure    Rehab Potential  Fair    Current Impairments/barriers affecting progress:  injury since Oct 18     OT Frequency  2x / week    OT Duration  6 weeks    OT Treatment/Interventions  Self-care/ADL training;Therapeutic exercise;Patient/family education;Splinting;Fluidtherapy;Manual Therapy;Iontophoresis    Plan  assess progress with custom thumb spica and ionto 3 rd session -and results from MD viisit    Clinical Decision Making  Multiple treatment options, significant  modification of task necessary    OT Home Exercise Plan  see pt instruction     Consulted and Agree with Plan of Care  Patient       Patient will benefit from skilled therapeutic intervention in order to improve the following deficits and impairments:  Pain, Impaired flexibility, Decreased range of motion, Impaired UE functional use, Decreased strength  Visit Diagnosis: Pain in right wrist  Stiffness of right wrist, not elsewhere classified  Muscle weakness (generalized)    Problem List There are no active problems to display for this patient.   Oletta CohnDuPreez, Anelle Parlow 05/15/2017, 7:53 PM  [facility] [address] [place], [place], [address] Phone: [phone]   Fax:  [phone]  Name: Michele [name] MRN: [id number] Date of Birth: [birth year]

## 2017-05-15 NOTE — Patient Instructions (Signed)
Same as last time - splint wearing  And pain free AROM 2 x day and keep patch on for hour

## 2017-05-22 ENCOUNTER — Ambulatory Visit: Payer: No Typology Code available for payment source | Admitting: Occupational Therapy

## 2017-05-22 DIAGNOSIS — M25631 Stiffness of right wrist, not elsewhere classified: Secondary | ICD-10-CM

## 2017-05-22 DIAGNOSIS — M25531 Pain in right wrist: Secondary | ICD-10-CM | POA: Diagnosis not present

## 2017-05-22 DIAGNOSIS — M6281 Muscle weakness (generalized): Secondary | ICD-10-CM

## 2017-05-22 NOTE — Therapy (Signed)
[facility] PHYSICAL AND SPORTS MEDICINE [address] [place], [place], [address] Phone: [phone]   Fax:  [phone]  Occupational Therapy Treatment  Patient Details  Name: Michele [name] MRN: [id number] Date of Birth: 23-Jun-1965 Referring Provider: Janee Mornhompson   Encounter Date: 05/22/2017  OT End of Session - 05/22/17 1234    Visit Number  3    Number of Visits  12    Date for OT Re-Evaluation  06/24/17    OT Start Time  1149    OT Stop Time  1245    OT Time Calculation (min)  56 min    Activity Tolerance  Patient tolerated treatment well;Patient limited by pain    Behavior During Therapy  [facility] for tasks assessed/performed       Past Medical History:  Diagnosis Date  . Diabetes mellitus without complication (HCC)   . Hypertension   . Migraines     Past Surgical History:  Procedure Laterality Date  . ABDOMINAL HYSTERECTOMY    . ANKLE SURGERY    . CESAREAN SECTION    . KNEE SURGERY      There were no vitals filed for this visit.  Subjective Assessment - 05/22/17 1229    Subjective   I seen the Dr last Thursday - he said that I did not do enough exercises - to take my splint off every 2 hrs and do ROM - and then the workers comp person was there with me - and he is going to do surgery - but to cont with you until I have surgery     Patient Stated Goals  I want to go back to work - I have work since I was 52 yrs old - and I have been working there  for about 13 yrs     Currently in Pain?  Yes    Pain Score  9     Pain Location  Wrist    Pain Orientation  Right    Pain Descriptors / Indicators  Tender;Sharp;Shooting         [facility] Assessment - 05/22/17 0001      AROM   Right Wrist Extension  45 Degrees 9/10 pain    Right Wrist Flexion  78 Degrees pain 10/10    Right Wrist Radial Deviation  25 Degrees pain 9/10    Right Wrist Ulnar Deviation  25 Degrees pain 9/10      Pt arrive with R wrist pain increase from last week  - 9/10 And tender over dorsal wrist and increase edema Pain over 1st dorsal compartment and volar wrist  With AROM   decrease AROM at wrist compare to last time   pt to cont doing AROM 5 x day - as MD order last Thursday and Finkelstein stretch   pt to do Pain free AROM at home recommended this date  And cont splint inbetween  Will let me know Friday when surgery is schedule         OT Treatments/Exercises (OP) - 05/22/17 0001      Moist Heat Therapy   Number Minutes Moist Heat  8 Minutes    Moist Heat Location  -- wrist and forearm prior to soft tissue       Iontophoresis   Type of Iontophoresis  Dexamethasone    Location  1st dorsal compartments and ECU of  R wrist     Dose  small patch and 2.0 current     Time  19      Skin check done prior - and pt to keep patch on for hour afterwards        OT Education - 05/22/17 1233    Education provided  Yes    Education Details  HEP and ionto -     Person(s) Educated  Patient    Methods  Explanation;Demonstration;Tactile cues    Comprehension  Returned demonstration;Verbalized understanding          OT Long Term Goals - 05/13/17 2019      OT LONG TERM GOAL #1   Title  Pain in R wrist improve with more than 20 points on PRWHE     Baseline  pain on PRWHE at eval 48/50     Time  4    Period  Weeks    Status  New    Target Date  06/10/17      OT LONG TERM GOAL #2   Title  Pt to be independent in HEP of wearing splint, AROM and modalities to decrease pain to less than 5/10     Baseline  pain 8-10/10     Time  3    Period  Weeks    Status  New    Target Date  06/03/17      OT LONG TERM GOAL #3   Title  Pain decrease for pt to be able to show increase AROM in R wrist  and use of R hand in ADL's without increase symptoms     Baseline  decrease wrist ROM in all planes and pain 6-10/10     Time  6    Period  Weeks    Status  New    Target Date  06/24/17            Plan - 05/22/17 1235    Clinical  Impression Statement  Pt present this date with increase pain with AROM of wrist - tender and pain with AROM on dorsal and volar wrist and 1st dorsal compartment- per pt she seen MD last week and  they going to plan surgery - and to cont with OT - but need to take splint off more - about every 2 hrs and do AROM and do Finkelstein stretch     Occupational performance deficits (Please refer to evaluation for details):  ADL's;IADL's;Work;Play;Leisure    Rehab Potential  Fair    Current Impairments/barriers affecting progress:  injury since Oct 18     OT Frequency  2x / week    OT Duration  6 weeks    OT Treatment/Interventions  Self-care/ADL training;Therapeutic exercise;Patient/family education;Splinting;Fluidtherapy;Manual Therapy;Iontophoresis    Plan  assess progress with custom thumb spica, increase ROM sessions  and ionto 4th session -    Clinical Decision Making  Multiple treatment options, significant modification of task necessary    OT Home Exercise Plan  see pt instruction     Consulted and Agree with Plan of Care  Patient       Patient will benefit from skilled therapeutic intervention in order to improve the following deficits and impairments:  Pain, Impaired flexibility, Decreased range of motion, Impaired UE functional use, Decreased strength  Visit Diagnosis: Pain in right wrist  Stiffness of right wrist, not elsewhere classified  Muscle weakness (generalized)    Problem List There are no active problems to display for this patient.   Oletta Cohn OTR/L,CLT 05/22/2017, 12:49 PM  [place] [facility] PHYSICAL AND SPORTS MEDICINE [address], [place], [address] Phone: [phone]   Fax:  [phone]  Name: Michele [name] MRN: [id number] Date of Birth: [birth year]

## 2017-05-22 NOTE — Patient Instructions (Signed)
Pt to take splint off 5 x day or every other 2hrs and do pain free AROM  And then put back on  Can do massage to dorsal forearm

## 2017-05-24 ENCOUNTER — Ambulatory Visit: Payer: No Typology Code available for payment source | Attending: Orthopedic Surgery | Admitting: Occupational Therapy

## 2017-05-24 DIAGNOSIS — M25631 Stiffness of right wrist, not elsewhere classified: Secondary | ICD-10-CM | POA: Insufficient documentation

## 2017-05-24 DIAGNOSIS — M6281 Muscle weakness (generalized): Secondary | ICD-10-CM | POA: Diagnosis present

## 2017-05-24 DIAGNOSIS — M25531 Pain in right wrist: Secondary | ICD-10-CM | POA: Diagnosis present

## 2017-05-24 NOTE — Therapy (Signed)
[facility] PHYSICAL AND SPORTS MEDICINE [address], [place], [address] Phone: [phone]   Fax:  [phone]  Occupational Therapy Treatment  Patient Details  Name: Bert Givans MRN: [id number] Date of Birth: 30-Sep-1965 Referring Provider: Janee Morn   Encounter Date: 05/24/2017  OT End of Session - 05/24/17 1434    Visit Number  5    Number of Visits  12    Date for OT Re-Evaluation  06/24/17    OT Start Time  1101    OT Stop Time  1140    OT Time Calculation (min)  39 min    Activity Tolerance  Patient tolerated treatment well;Patient limited by pain    Behavior During Therapy  [facility] for tasks assessed/performed       Past Medical History:  Diagnosis Date  . Diabetes mellitus without complication (HCC)   . Hypertension   . Migraines     Past Surgical History:  Procedure Laterality Date  . ABDOMINAL HYSTERECTOMY    . ANKLE SURGERY    . CESAREAN SECTION    . KNEE SURGERY      There were no vitals filed for this visit.  Subjective Assessment - 05/24/17 1131    Subjective   I did like you told me - because I felt better after I have seen you the other day - pain is better - but still at thumb and wrist in bottom - scheduled for surgery Tuesday     Patient Stated Goals  I want to go back to work - I have work since I was 52 yrs old - and I have been working there  for about 13 yrs            Not tender over ECU this date like last time  But tender over volar wrist and 1st dorsal compartment Report pain 8/10 coming in    Soft itssue mobs done to volar and dorsal forearm - with graston tool nr 2   sweeping prior to ROM but after heat    AAROM and AROM for wrist in all planes - with wrist being stabilize by OT  And thumb AROM for PA , RA and flexion   OT Treatments/Exercises (OP) - 05/24/17 0001      Moist Heat Therapy   Number Minutes Moist Heat  8 Minutes    Moist Heat Location  -- hand and wrist R        Iontophoresis   Type of Iontophoresis  Dexamethasone    Location  1st dorsal compartments and volar wrist on Rwrist     Dose  small patch and 2.0 current     Time  19      Skin check done prior to ionto  And pt to keep it on for hour afterwards        OT Education - 05/24/17 1434    Education provided  Yes    Education Details  cont with AROM for wrist in pain free range     Person(s) Educated  Patient    Methods  Explanation;Demonstration;Tactile cues    Comprehension  Verbalized understanding;Returned demonstration          OT Long Term Goals - 05/13/17 2019      OT LONG TERM GOAL #1   Title  Pain in R wrist improve with more than 20 points on PRWHE     Baseline  pain on PRWHE at eval 48/50  Time  4    Period  Weeks    Status  New    Target Date  06/10/17      OT LONG TERM GOAL #2   Title  Pt to be independent in HEP of wearing splint, AROM and modalities to decrease pain to less than 5/10     Baseline  pain 8-10/10     Time  3    Period  Weeks    Status  New    Target Date  06/03/17      OT LONG TERM GOAL #3   Title  Pain decrease for pt to be able to show increase AROM in R wrist  and use of R hand in ADL's without increase symptoms     Baseline  decrease wrist ROM in all planes and pain 6-10/10     Time  6    Period  Weeks    Status  New    Target Date  06/24/17            Plan - 05/24/17 1434    Clinical Impression Statement  Pt pain decrease on dorsal wrist over ECU this date compare to last time - still pain over volar wrist flexors and 1st dorsal compartment - pain reported 8/10 - coming in - pt scheduled for surgery this Tuesday  - will contact us if need to return     Occupational performance deficits (Please refer to evaluation for details):  ADL's;IADL's;Work;Play;Leisure    Current Impairments/barriers affecting progress:  injury since Oct 18     Plan  Pt having surgery to hand in Tues - need referral from MD if need to return in  future     Clinical Decision Making  Multiple treatment options, significant modification of task necessary    OT Home Exercise Plan  see pt instruction     Consulted and Agree with Plan of Care  Patient       Patient will benefit from skilled therapeutic intervention in order to improve the following deficits and impairments:  Decreased range of motion  Visit Diagnosis: Pain in right wrist  Stiffness of right wrist, not elsewhere classified  Muscle weakness (generalized)    Problem List There are no active problems to display for this patient.   Oletta CohnuPreez, Jaylene Schrom OTR/L,CLT  05/24/2017, 2:38 PM  [facility] [address] , [place], [address] Phone: [phone]   Fax:  [phone]  Name: Clearance CootsLaura Ann Laube MRN: [id number] Date of Birth: 21-Jan-1966

## 2017-05-24 NOTE — Patient Instructions (Signed)
Cont with same HEP until surgery Tues

## 2017-06-30 ENCOUNTER — Emergency Department
Admission: EM | Admit: 2017-06-30 | Discharge: 2017-06-30 | Disposition: A | Discharge status: Home / Self Care | Payer: No Typology Code available for payment source | Attending: Emergency Medicine | Admitting: Emergency Medicine

## 2017-06-30 ENCOUNTER — Emergency Department: Payer: No Typology Code available for payment source

## 2017-06-30 ENCOUNTER — Other Ambulatory Visit: Payer: Self-pay

## 2017-06-30 DIAGNOSIS — I10 Essential (primary) hypertension: Secondary | ICD-10-CM | POA: Insufficient documentation

## 2017-06-30 DIAGNOSIS — Z79899 Other long term (current) drug therapy: Secondary | ICD-10-CM | POA: Diagnosis not present

## 2017-06-30 DIAGNOSIS — M1712 Unilateral primary osteoarthritis, left knee: Secondary | ICD-10-CM | POA: Diagnosis not present

## 2017-06-30 DIAGNOSIS — Z7984 Long term (current) use of oral hypoglycemic drugs: Secondary | ICD-10-CM | POA: Insufficient documentation

## 2017-06-30 DIAGNOSIS — M25562 Pain in left knee: Secondary | ICD-10-CM

## 2017-06-30 DIAGNOSIS — E119 Type 2 diabetes mellitus without complications: Secondary | ICD-10-CM | POA: Diagnosis not present

## 2017-06-30 MED ORDER — KETOROLAC TROMETHAMINE 30 MG/ML IJ SOLN
30.0000 mg | Freq: Once | INTRAMUSCULAR | Status: AC
  Administered 2017-06-30: 30 mg via INTRAMUSCULAR

## 2017-06-30 MED ORDER — MELOXICAM 15 MG PO TABS: 15.0000 mg | ORAL_TABLET | Freq: Every day | ORAL | 0 refills | Status: DC

## 2017-06-30 MED ORDER — KETOROLAC TROMETHAMINE 30 MG/ML IJ SOLN
INTRAMUSCULAR | Status: AC
  Filled 2017-06-30: qty 1

## 2017-06-30 NOTE — ED Provider Notes (Signed)
[facility] Emergency Department Provider Note  ____________________________________________  Time seen: Approximately 4:50 PM  I have reviewed the triage vital signs and the nursing notes.   HISTORY  Chief Complaint Knee Pain    HPI Michele [name] is a 52 y.o. female who presents the emergency department for ongoing left knee pain.  Patient reports that every few years she will have an increase in pain, and her knee will "give away."  Patient reports that she had a surgery most likely arthroscopic surgery to improve partial meniscal tear approximately 9 years ago.  Patient reports that she has received joint injections in the past as well.  The last flare was approximately 3 years ago.  Patient denies any direct trauma or injury to the knee.  She has not been taking medication for same.  No other injury or complaint.  Past Medical History:  Diagnosis Date  . Diabetes mellitus without complication (HCC)   . Hypertension   . Migraines     There are no active problems to display for this patient.   Past Surgical History:  Procedure Laterality Date  . ABDOMINAL HYSTERECTOMY    . ANKLE SURGERY    . CESAREAN SECTION    . KNEE SURGERY      Prior to Admission medications   Medication Sig Start Date End Date Taking? Authorizing Provider  albuterol (PROVENTIL HFA;VENTOLIN HFA) 108 (90 BASE) MCG/ACT inhaler Inhale 2 puffs into the lungs every 6 (six) hours as needed for wheezing or shortness of breath. 08/26/14   Schaevitz, Myra Rude, MD  amitriptyline (ELAVIL) 50 MG tablet Take 50 mg by mouth at bedtime.    [provider]  baclofen (LIORESAL) 10 MG tablet Take 1 tablet (10 mg total) by mouth 3 (three) times daily. Patient not taking: Reported on 10/04/2016 06/14/15   Kem Boroughs B, FNP  cyclobenzaprine (FLEXERIL) 10 MG tablet Take 1 tablet (10 mg total) by mouth 3 (three) times daily as needed for muscle spasms. Patient not taking: Reported on  04/03/2017 10/04/16   Nita Sickle, MD  Elastic Bandages & Supports (KNEE BRACE/HINGED BARS XL) MISC 1 Device by Does not apply route daily. 07/17/16   Triplett, Rulon Eisenmenger B, FNP  gabapentin (NEURONTIN) 100 MG capsule Take 1 capsule (100 mg total) by mouth 3 (three) times daily. 09/20/15 09/19/16  Phineas Semen, MD  glipiZIDE (GLUCOTROL) 5 MG tablet Take 5 mg by mouth daily before breakfast.    [provider]  HYDROcodone-acetaminophen (NORCO/VICODIN) 5-325 MG tablet Take 1 tablet by mouth every 6 (six) hours as needed for moderate pain. Patient not taking: Reported on 10/04/2016 09/28/16   Sharyn Creamer, MD  ibuprofen (ADVIL,MOTRIN) 600 MG tablet Take 1 tablet (600 mg total) by mouth every 6 (six) hours as needed. 10/04/16   Nita Sickle, MD  meloxicam (MOBIC) 15 MG tablet Take 1 tablet (15 mg total) by mouth daily. 06/30/17   Tarin Navarez, Delorise Royals, PA-C  metFORMIN (GLUCOPHAGE) 1000 MG tablet Take 1 tablet (1,000 mg total) by mouth 2 (two) times daily with a meal. 03/29/17 03/29/18  Emily Filbert, MD  metroNIDAZOLE (FLAGYL) 500 MG tablet Take 1 tablet (500 mg total) by mouth 2 (two) times daily. 03/29/17   Emily Filbert, MD  naproxen (NAPROSYN) 500 MG tablet Take 1 tablet (500 mg total) by mouth 2 (two) times daily with a meal. Patient not taking: Reported on 10/04/2016 07/01/15   Jene Every, MD  prochlorperazine (COMPAZINE) 10 MG tablet Take 1 tablet (  10 mg total) by mouth every 8 (eight) hours as needed (headache). Patient not taking: Reported on 10/04/2016 09/22/15   Phineas Semen, MD  SUMAtriptan-naproxen (TREXIMET) 85-500 MG per tablet Take 1 tablet by mouth every 2 (two) hours as needed for migraine.    [provider]  topiramate (TOPAMAX) 100 MG tablet Take 100 mg by mouth 2 (two) times daily.    [provider]  triamterene-hydrochlorothiazide (MAXZIDE-25) 37.5-25 MG per tablet Take 1 tablet by mouth daily.    [provider]  verapamil (COVERA  HS) 180 MG (CO) 24 hr tablet Take 180 mg by mouth at bedtime.    [provider]    Allergies Patient has no known allergies.  Family History  Problem Relation Age of Onset  . Breast cancer Neg Hx     Social History Social History   Tobacco Use  . Smoking status: Never Smoker  . Smokeless tobacco: Current User    Types: Snuff  Substance Use Topics  . Alcohol use: Yes    Comment: occasionally  . Drug use: No     Review of Systems  Constitutional: No fever/chills Eyes: No visual changes. Cardiovascular: no chest pain. Respiratory: no cough. No SOB. Gastrointestinal: No abdominal pain.  No nausea, no vomiting.  No diarrhea.  No constipation. Musculoskeletal: Positive for left knee pain Skin: Negative for rash, abrasions, lacerations, ecchymosis. Neurological: Negative for headaches, focal weakness or numbness. 10-point ROS otherwise negative.  ____________________________________________   PHYSICAL EXAM:  VITAL SIGNS: ED Triage Vitals  Enc Vitals Group     BP 06/30/17 1519 124/75     Pulse Rate 06/30/17 1518 84     Resp 06/30/17 1518 18     Temp 06/30/17 1518 98.6 F (37 C)     Temp Source 06/30/17 1518 Oral     SpO2 06/30/17 1518 98 %     Weight 06/30/17 1518 242 lb (109.8 kg)     Height 06/30/17 1518  (1.676 m)     Head Circumference --      Peak Flow --      Pain Score 06/30/17 1518 10     Pain Loc --      Pain Edu? --      Excl. in GC? --      Constitutional: Alert and oriented. Well appearing and in no acute distress. Eyes: Conjunctivae are normal. PERRL. EOMI. Head: Atraumatic. ENT:      Ears:       Nose: No congestion/rhinnorhea.      Mouth/Throat: Mucous membranes are moist.  Neck: No stridor.    Cardiovascular: Normal rate, regular rhythm. Normal S1 and S2.  Good peripheral circulation. Respiratory: Normal respiratory effort without tachypnea or retractions. Lungs CTAB. Good air entry to the bases with no decreased or absent  breath sounds. Musculoskeletal: Full range of motion to all extremities. No gross deformities appreciated.  No gross deformity, edema, erythema, ecchymosis noted to the left knee.  Full range of motion.  Patient is tender to palpation along the medial joint line with no palpable abnormality.  Varus, valgus, Lachman's, McMurray's is negative.  Dorsalis pedis pulse intact distally.  Sensation intact distally. Neurologic:  Normal speech and language. No gross focal neurologic deficits are appreciated.  Skin:  Skin is warm, dry and intact. No rash noted. Psychiatric: Mood and affect are normal. Speech and behavior are normal. Patient exhibits appropriate insight and judgement.   ____________________________________________   LABS (all labs ordered are listed, but  only abnormal results are displayed)  Labs Reviewed - No data to display ____________________________________________  EKG   ____________________________________________  RADIOLOGY Festus Barren Sun Wilensky, personally viewed and evaluated these images (plain radiographs) as part of my medical decision making, as well as reviewing the written report by the radiologist.  Mild tricompartmental arthritis is appreciated.  No fractures identified.  No gross joint effusion.  Dg Knee Complete 4 Views Left  Result Date: 06/30/2017 CLINICAL DATA:  Patient reports increased left knee pain over past 1-2 weeks. Denies any recent injury to left knee, however patient reports multiple falls over past 2 weeks. Patient reports hx of left knee surgery. EXAM: LEFT KNEE - COMPLETE 4+ VIEW COMPARISON:  Knee MRI, 01/02/2016 FINDINGS: No fracture.  No bone lesion. Mild narrowing of the patellofemoral joint space compartment. There are marginal osteophytes from all 3 compartments. No joint effusion. Surrounding soft tissues are unremarkable. IMPRESSION: 1. No fracture or acute finding.  No joint effusion. 2. Degenerative changes involving all 3 compartments, as  noted on the prior knee MRI. Electronically Signed   By: Amie Portland M.D.   On: 06/30/2017 16:32    ____________________________________________    PROCEDURES  Procedure(s) performed:    Procedures    Medications  ketorolac (TORADOL) 30 MG/ML injection 30 mg (30 mg Intramuscular Given 06/30/17 1716)     ____________________________________________   INITIAL IMPRESSION / ASSESSMENT AND PLAN / ED COURSE  Pertinent labs & imaging results that were available during my care of the patient were reviewed by me and considered in my medical decision making (see chart for details).  Review of the [place] CSRS was performed in accordance of the [facility] prior to dispensing any controlled drugs.     Patient's diagnosis is consistent with tricompartmental osteoarthritis to the left knee with increased pain.  Exam was reassuring.  Imaging reveals no acute osseous abnormality.  This is an acute on chronic problem.  Patient is advised to follow-up with orthopedics for further management of ongoing condition.  Patient will be prescribed anti-inflammatories at this time.. Patient will be discharged home with prescriptions for meloxicam. Patient is to follow up with primary care or orthopedics as needed or otherwise directed. Patient is given ED precautions to return to the ED for any worsening or new symptoms.     ____________________________________________  FINAL CLINICAL IMPRESSION(S) / ED DIAGNOSES  Final diagnoses:  Primary osteoarthritis of left knee  Acute pain of left knee      NEW MEDICATIONS STARTED DURING THIS VISIT:  ED Discharge Orders        Ordered    meloxicam (MOBIC) 15 MG tablet  Daily     06/30/17 1717          This chart was dictated using voice recognition software/Dragon. Despite best efforts to proofread, errors can occur which can change the meaning. Any change was purely unintentional.    Racheal Patches, PA-C 06/30/17 1717    Sharyn Creamer,  MD 06/30/17 [phone]

## 2017-06-30 NOTE — ED Triage Notes (Signed)
Pt states L knee pain x 2 weeks. Denies fall. Alert, oriented, in wheelchair. Pt states only hurts medial knee. States her knee has been giving out when the pain hits. States this happens every couple years. Hx of surgery in L knee, states they "went in and scraped it"

## 2017-06-30 NOTE — ED Notes (Signed)
Pt room found empty att

## 2017-09-01 ENCOUNTER — Other Ambulatory Visit: Payer: Self-pay

## 2017-09-01 ENCOUNTER — Emergency Department
Admission: EM | Admit: 2017-09-01 | Discharge: 2017-09-01 | Disposition: A | Discharge status: Home / Self Care | Payer: PRIVATE HEALTH INSURANCE | Attending: Emergency Medicine | Admitting: Emergency Medicine

## 2017-09-01 DIAGNOSIS — Z7984 Long term (current) use of oral hypoglycemic drugs: Secondary | ICD-10-CM | POA: Diagnosis not present

## 2017-09-01 DIAGNOSIS — Z79899 Other long term (current) drug therapy: Secondary | ICD-10-CM | POA: Insufficient documentation

## 2017-09-01 DIAGNOSIS — I10 Essential (primary) hypertension: Secondary | ICD-10-CM | POA: Diagnosis not present

## 2017-09-01 DIAGNOSIS — R110 Nausea: Secondary | ICD-10-CM | POA: Diagnosis not present

## 2017-09-01 DIAGNOSIS — R531 Weakness: Secondary | ICD-10-CM | POA: Insufficient documentation

## 2017-09-01 DIAGNOSIS — F17220 Nicotine dependence, chewing tobacco, uncomplicated: Secondary | ICD-10-CM | POA: Insufficient documentation

## 2017-09-01 DIAGNOSIS — R42 Dizziness and giddiness: Secondary | ICD-10-CM | POA: Insufficient documentation

## 2017-09-01 DIAGNOSIS — E119 Type 2 diabetes mellitus without complications: Secondary | ICD-10-CM | POA: Insufficient documentation

## 2017-09-01 LAB — URINALYSIS, COMPLETE (UACMP) WITH MICROSCOPIC
BACTERIA UA: NONE SEEN
Bilirubin Urine: NEGATIVE
Glucose, UA: 50 mg/dL — AB
Hgb urine dipstick: NEGATIVE
Ketones, ur: NEGATIVE mg/dL
Leukocytes, UA: NEGATIVE
Nitrite: NEGATIVE
Protein, ur: NEGATIVE mg/dL
SPECIFIC GRAVITY, URINE: 1.019 (ref 1.005–1.030)
pH: 7 (ref 5.0–8.0)

## 2017-09-01 LAB — GLUCOSE, CAPILLARY: GLUCOSE-CAPILLARY: 179 mg/dL — AB (ref 70–99)

## 2017-09-01 LAB — BASIC METABOLIC PANEL
ANION GAP: 8 (ref 5–15)
BUN: 23 mg/dL — ABNORMAL HIGH (ref 6–20)
CALCIUM: 8.5 mg/dL — AB (ref 8.9–10.3)
CO2: 25 mmol/L (ref 22–32)
Chloride: 104 mmol/L (ref 98–111)
Creatinine, Ser: 1.09 mg/dL — ABNORMAL HIGH (ref 0.44–1.00)
GFR calc Af Amer: >60 mL/min (ref >60)
GFR calc non Af Amer: 59 mL/min — ABNORMAL LOW (ref >60)
GLUCOSE: 205 mg/dL — AB (ref 70–99)
POTASSIUM: 3.5 mmol/L (ref 3.5–5.1)
Sodium: 137 mmol/L (ref 135–145)

## 2017-09-01 LAB — CBC
HEMATOCRIT: 39.5 % (ref 35.0–47.0)
HEMOGLOBIN: 13 g/dL (ref 12.0–16.0)
MCH: 27.3 pg (ref 26.0–34.0)
MCHC: 33 g/dL (ref 32.0–36.0)
MCV: 82.6 fL (ref 80.0–100.0)
Platelets: 287 10*3/uL (ref 150–440)
RBC: 4.78 MIL/uL (ref 3.80–5.20)
RDW: 13.8 % (ref 11.5–14.5)
WBC: 8.7 10*3/uL (ref 3.6–11.0)

## 2017-09-01 LAB — TROPONIN I: Troponin I: <0.03 ng/mL

## 2017-09-01 MED ORDER — MECLIZINE HCL 25 MG PO TABS
25.0000 mg | ORAL_TABLET | Freq: Once | ORAL | Status: AC
  Administered 2017-09-01: 25 mg via ORAL
  Filled 2017-09-01: qty 1

## 2017-09-01 MED ORDER — SODIUM CHLORIDE 0.9 % IV BOLUS
1000.0000 mL | Freq: Once | INTRAVENOUS | Status: AC
  Administered 2017-09-01: 1000 mL via INTRAVENOUS

## 2017-09-01 NOTE — ED Notes (Signed)
Pt assisted to the bathroom by this RN. Pt back to bed, awaiting fluids to finish prior to discharge per MD.

## 2017-09-01 NOTE — ED Notes (Signed)
Gave pt crackers, grape juice, and a warm blanket. Checked with MD before giving food.

## 2017-09-01 NOTE — ED Notes (Signed)
First Nurse Note: Pt to ED c/o "hands getting nervous when she tries to hold onto something", off balance, and legs shaking. Pt is in NAD at this time.

## 2017-09-01 NOTE — ED Provider Notes (Signed)
[facility] Emergency Department Provider Note ____________________________________________   First MD Initiated Contact with Patient 09/01/17 1131     (approximate)  I have reviewed the triage vital signs and the nursing notes.   HISTORY  Chief Complaint Weakness  HPI Michele [name] is a 52 y.o. female with a history of diabetes and hypertension was presenting to the emergency department with generalized weakness.  She says that the weakness started yesterday when she was out in the heat at a funeral.  She states that she was sweating a lot and began to feel weak all over.  She says that she felt a tightness in both her wrists and her ankles and took her blood sugar and found to be 62.  At that time she drank some orange juice and although her glucose increased she says the symptoms did not improve.  Says that she also feels a spinning sensation when she turns her head from left to right and felt pressure around her left ear yesterday.  She is denying any pain.  Says that she has been nauseous without any vomiting.  Denies any diarrhea.  Has not had any further episodes of hypoglycemia since the event yesterday.  Past Medical History:  Diagnosis Date  . Diabetes mellitus without complication (HCC)   . Hypertension   . Migraines     There are no active problems to display for this patient.   Past Surgical History:  Procedure Laterality Date  . ABDOMINAL HYSTERECTOMY    . ANKLE SURGERY    . CESAREAN SECTION    . KNEE SURGERY    . WRIST SURGERY      Prior to Admission medications   Medication Sig Start Date End Date Taking? Authorizing Provider  albuterol (PROVENTIL HFA;VENTOLIN HFA) 108 (90 BASE) MCG/ACT inhaler Inhale 2 puffs into the lungs every 6 (six) hours as needed for wheezing or shortness of breath. 08/26/14   Schaevitz, Myra Rude, MD  amitriptyline (ELAVIL) 50 MG tablet Take 50 mg by mouth at bedtime.    [provider]  baclofen  (LIORESAL) 10 MG tablet Take 1 tablet (10 mg total) by mouth 3 (three) times daily. Patient not taking: Reported on 10/04/2016 06/14/15   Kem Boroughs B, FNP  cyclobenzaprine (FLEXERIL) 10 MG tablet Take 1 tablet (10 mg total) by mouth 3 (three) times daily as needed for muscle spasms. Patient not taking: Reported on 04/03/2017 10/04/16   Nita Sickle, MD  Elastic Bandages & Supports (KNEE BRACE/HINGED BARS XL) MISC 1 Device by Does not apply route daily. 07/17/16   Triplett, Rulon Eisenmenger B, FNP  gabapentin (NEURONTIN) 100 MG capsule Take 1 capsule (100 mg total) by mouth 3 (three) times daily. 09/20/15 09/19/16  Phineas Semen, MD  glipiZIDE (GLUCOTROL) 5 MG tablet Take 5 mg by mouth daily before breakfast.    [provider]  HYDROcodone-acetaminophen (NORCO/VICODIN) 5-325 MG tablet Take 1 tablet by mouth every 6 (six) hours as needed for moderate pain. Patient not taking: Reported on 10/04/2016 09/28/16   Sharyn Creamer, MD  ibuprofen (ADVIL,MOTRIN) 600 MG tablet Take 1 tablet (600 mg total) by mouth every 6 (six) hours as needed. 10/04/16   Nita Sickle, MD  meloxicam (MOBIC) 15 MG tablet Take 1 tablet (15 mg total) by mouth daily. 06/30/17   Cuthriell, Delorise Royals, PA-C  metFORMIN (GLUCOPHAGE) 1000 MG tablet Take 1 tablet (1,000 mg total) by mouth 2 (two) times daily with a meal. 03/29/17 03/29/18  Emily Filbert, MD  metroNIDAZOLE (FLAGYL) 500 MG tablet Take 1 tablet (500 mg total) by mouth 2 (two) times daily. 03/29/17   Emily FilbertWilliams, Jonathan E, MD  naproxen (NAPROSYN) 500 MG tablet Take 1 tablet (500 mg total) by mouth 2 (two) times daily with a meal. Patient not taking: Reported on 10/04/2016 07/01/15   Jene EveryKinner, Robert, MD  prochlorperazine (COMPAZINE) 10 MG tablet Take 1 tablet (10 mg total) by mouth every 8 (eight) hours as needed (headache). Patient not taking: Reported on 10/04/2016 09/22/15   Phineas SemenGoodman, Graydon, MD  SUMAtriptan-naproxen (TREXIMET) 85-500 MG per tablet Take 1 tablet by mouth every  2 (two) hours as needed for migraine.    [provider]  topiramate (TOPAMAX) 100 MG tablet Take 100 mg by mouth 2 (two) times daily.    [provider]  triamterene-hydrochlorothiazide (MAXZIDE-25) 37.5-25 MG per tablet Take 1 tablet by mouth daily.    [provider]  verapamil (COVERA HS) 180 MG (CO) 24 hr tablet Take 180 mg by mouth at bedtime.    [provider]    Allergies Patient has no known allergies.  Family History  Problem Relation Age of Onset  . Breast cancer Neg Hx     Social History Social History   Tobacco Use  . Smoking status: Never Smoker  . Smokeless tobacco: Current User    Types: Snuff  Substance Use Topics  . Alcohol use: Yes    Comment: occasionally  . Drug use: No    Review of Systems  Constitutional: No fever/chills Eyes: No visual changes. ENT: No sore throat. Cardiovascular: Denies chest pain. Respiratory: Denies shortness of breath. Gastrointestinal: No abdominal pain.no vomiting.  No diarrhea.  No constipation. Genitourinary: Negative for dysuria. Musculoskeletal: Negative for back pain. Skin: Negative for rash. Neurological: Negative for headaches, focal weakness or numbness. ____________________________________________   PHYSICAL EXAM:  VITAL SIGNS: ED Triage Vitals  Enc Vitals Group     BP 09/01/17 0901 (!) 147/91     Pulse Rate 09/01/17 0901 73     Resp 09/01/17 0901 18     Temp 09/01/17 0901 97.7 F (36.5 C)     Temp Source 09/01/17 0901 Oral     SpO2 09/01/17 0901 100 %     Weight 09/01/17 0905 247 lb (112 kg)     Height 09/01/17 0905 5\' 6"  (1.676 m)     Head Circumference --      Peak Flow --      Pain Score 09/01/17 0902 0     Pain Loc --      Pain Edu? --      Excl. in GC? --     Constitutional: Alert and oriented. Well appearing and in no acute distress. Eyes: Conjunctivae are normal.  Head: Atraumatic.  Normal TMs bilaterally.  No tenderness to palpation to the bilateral  mastoid processes.  No swelling or erythema. Nose: No congestion/rhinnorhea. Mouth/Throat: Mucous membranes are moist.  Neck: No stridor.   Cardiovascular: Normal rate, regular rhythm. Grossly normal heart sounds. Respiratory: Normal respiratory effort.  No retractions. Lungs CTAB. Gastrointestinal: Soft and nontender. No distention. No CVA tenderness. Musculoskeletal: No lower extremity tenderness nor edema.  No joint effusions. Neurologic:  Normal speech and language. No gross focal neurologic deficits are appreciated.  No nystagmus.  No ataxia on finger-to-nose testing.   Skin:  Skin is warm, dry and intact. No rash noted. Psychiatric: Mood and affect are normal. Speech and behavior are normal.  ____________________________________________   LABS (all labs ordered  are listed, but only abnormal results are displayed)  Labs Reviewed  BASIC METABOLIC PANEL - Abnormal; Notable for the following components:      Result Value   Glucose, Bld 205 (*)    BUN 23 (*)    Creatinine, Ser 1.09 (*)    Calcium 8.5 (*)    GFR calc non Af Amer 59 (*)    All other components within normal limits  URINALYSIS, COMPLETE (UACMP) WITH MICROSCOPIC - Abnormal; Notable for the following components:   Color, Urine YELLOW (*)    APPearance CLEAR (*)    Glucose, UA 50 (*)    All other components within normal limits  GLUCOSE, CAPILLARY - Abnormal; Notable for the following components:   Glucose-Capillary 179 (*)    All other components within normal limits  CBC  TROPONIN I  CBG MONITORING, ED  POC URINE PREG, ED   ____________________________________________  EKG  ED ECG REPORT I, Arelia Longest, the attending physician, personally viewed and interpreted this ECG.   Date: 09/01/2017  EKG Time: 0905  Rate: 72  Rhythm: normal sinus rhythm  Axis: Normal  Intervals:none  ST&T Change: No ST segment elevation or depression.  No abnormal T wave  inversion.  ____________________________________________  RADIOLOGY   ____________________________________________   PROCEDURES  Procedure(s) performed:   Procedures  Critical Care performed:   ____________________________________________   INITIAL IMPRESSION / ASSESSMENT AND PLAN / ED COURSE  Pertinent labs & imaging results that were available during my care of the patient were reviewed by me and considered in my medical decision making (see chart for details).  DDX: Peripheral vertigo, electrolyte abnormality, dehydration, exhaustion, CVA, UTI, anemia As part of my medical decision making, I reviewed the following data within the electronic MEDICAL RECORD NUMBER Notes from prior ED visits  Patient without any findings consistent with central vertigo.  I will give her fluid hydration as well as meclizine and reassess.  ----------------------------------------- 3:07 PM on 09/01/2017 -----------------------------------------  Patient at this time says that she feels improved and is able to ambulate without assistance with a normal gait and says that her weakness is reduced.  She still is about 500 cc of fluid left.  Says that she is still feeling vertiginous but says that the vertigo piece to this is typical of her past vertigo.  We discussed treatment for vertigo and she says that she has meclizine at home.  We then discussed further work-up such as CT of the brain but the patient says that she would rather go home and rest after fluids.  I discussed that we are unable to evaluate for other conditions such as stroke and that if her symptoms do not resolve within the next 24 to 48 hours that she must return immediately to the emergency department for further evaluation.  She is understanding of the diagnosis as well as treatment plan willing to comply.    ____________________________________________   FINAL CLINICAL IMPRESSION(S) / ED DIAGNOSES  Vertigo.  Weakness.    NEW  MEDICATIONS STARTED DURING THIS VISIT:  New Prescriptions   No medications on file     Note:  This document was prepared using Dragon voice recognition software and may include unintentional dictation errors.     Myrna Blazer, MD 09/01/17 [id number]

## 2017-09-01 NOTE — ED Notes (Signed)
Dr. Pershing ProudSchaevitz at bedside to speak with patient at this time.

## 2017-09-01 NOTE — ED Triage Notes (Signed)
Pt arrives with mult complaints. States nausea, weakness, HA, hypoglycemia. States CBG 62 yesterday. Hx DM type 2. Takes insulin and pills. States she feels off-balance. Main complaint is weakness. Clear speech. Symptoms began yesterday morning. No neuro deficits noted in triage. Alert, oriented.

## 2017-09-01 NOTE — ED Notes (Signed)
Pt reports increasing weakness and fatigue that started yesterday. Pt also c/o dizziness that is worse when she turns her head to the side. Pt states that she feels like she is "falling, like [she] could fall asleep laying [in the bed]." Pt states yesterday CBG 62 at home, drank some OJ and her sugar returned to normal. Pt is alert and oriented on arrival to ED. No neurological deficits noted at this time.

## 2017-09-01 NOTE — ED Notes (Signed)
NAD noted at time of D/C. Pt taken to lobby via wheelchair by this RN. Pt denies comments/concerns at this time.

## 2017-10-13 ENCOUNTER — Other Ambulatory Visit: Payer: Self-pay

## 2017-10-13 ENCOUNTER — Encounter: Payer: Self-pay | Admitting: Emergency Medicine

## 2017-10-13 ENCOUNTER — Emergency Department
Admission: EM | Admit: 2017-10-13 | Discharge: 2017-10-13 | Disposition: A | Discharge status: Home / Self Care | Payer: PRIVATE HEALTH INSURANCE | Attending: Emergency Medicine | Admitting: Emergency Medicine

## 2017-10-13 DIAGNOSIS — M545 Low back pain: Secondary | ICD-10-CM | POA: Diagnosis present

## 2017-10-13 DIAGNOSIS — I10 Essential (primary) hypertension: Secondary | ICD-10-CM | POA: Insufficient documentation

## 2017-10-13 DIAGNOSIS — Z79899 Other long term (current) drug therapy: Secondary | ICD-10-CM | POA: Diagnosis not present

## 2017-10-13 DIAGNOSIS — E119 Type 2 diabetes mellitus without complications: Secondary | ICD-10-CM | POA: Insufficient documentation

## 2017-10-13 DIAGNOSIS — M5441 Lumbago with sciatica, right side: Secondary | ICD-10-CM | POA: Diagnosis not present

## 2017-10-13 DIAGNOSIS — F17290 Nicotine dependence, other tobacco product, uncomplicated: Secondary | ICD-10-CM | POA: Diagnosis not present

## 2017-10-13 DIAGNOSIS — Z7984 Long term (current) use of oral hypoglycemic drugs: Secondary | ICD-10-CM | POA: Insufficient documentation

## 2017-10-13 MED ORDER — PREDNISONE 10 MG (21) PO TBPK: ORAL_TABLET | ORAL | 0 refills | Status: DC

## 2017-10-13 MED ORDER — BACLOFEN 10 MG PO TABS: 10.0000 mg | ORAL_TABLET | Freq: Every day | ORAL | 1 refills | Status: DC

## 2017-10-13 MED ORDER — KETOROLAC TROMETHAMINE 30 MG/ML IJ SOLN
30.0000 mg | Freq: Once | INTRAMUSCULAR | Status: AC
  Administered 2017-10-13: 30 mg via INTRAMUSCULAR
  Filled 2017-10-13: qty 1

## 2017-10-13 MED ORDER — TRAMADOL HCL 50 MG PO TABS: 50.0000 mg | ORAL_TABLET | Freq: Four times a day (QID) | ORAL | 0 refills | Status: DC | PRN

## 2017-10-13 MED ORDER — ORPHENADRINE CITRATE 30 MG/ML IJ SOLN
60.0000 mg | Freq: Two times a day (BID) | INTRAMUSCULAR | Status: DC
  Administered 2017-10-13: 60 mg via INTRAMUSCULAR
  Filled 2017-10-13: qty 2

## 2017-10-13 NOTE — ED Triage Notes (Signed)
Pt to ED via POV c/o right hip pain and numbness, as well as right arm pain and numbness. Pt states that this has been going on for 2-3 days. Pt states that pain started in her right lower back and then moved into her leg and her arm. Pt states that she has also been having headaches when waking up the past few days but as the day progresses the headaches go away. Pt is in NAD at this time. Pt denies neck pain.

## 2017-10-13 NOTE — ED Notes (Signed)
See triage note  Presents with right hip pain  States pain started about 3 days ago w/o known injury  Then pain moved into right leg and leg felt numb  Then this am developed some numbness to right arm  Equal grips

## 2017-10-13 NOTE — ED Provider Notes (Signed)
[facility] Emergency Department Provider Note  ____________________________________________   First MD Initiated Contact with Patient 10/13/17 1134     (approximate)  I have reviewed the triage vital signs and the nursing notes.   HISTORY  Chief Complaint Hip Pain    HPI Michele [name] is a 52 y.o. female presents emergency department complaining of right sided sciatica.  States she started with lower back pain and then it moved into her legs.  She states after she is been laying around his moved into her arm on the right side.  She denies any fever or chills.  She denies severe headache.  She states she has history of migraines and has not had a headache like that with this symptom.  States feels like she has a tension headache.  States the headache goes away with Tylenol or ibuprofen.  She has not had a muscle relaxers to help with spasms.  She is denying any injury.    Past Medical History:  Diagnosis Date  . Diabetes mellitus without complication (HCC)   . Hypertension   . Migraines     There are no active problems to display for this patient.   Past Surgical History:  Procedure Laterality Date  . ABDOMINAL HYSTERECTOMY    . ANKLE SURGERY    . CESAREAN SECTION    . KNEE SURGERY    . WRIST SURGERY      Prior to Admission medications   Medication Sig Start Date End Date Taking? Authorizing Provider  albuterol (PROVENTIL HFA;VENTOLIN HFA) 108 (90 BASE) MCG/ACT inhaler Inhale 2 puffs into the lungs every 6 (six) hours as needed for wheezing or shortness of breath. 08/26/14   Schaevitz, Myra Rude, MD  amitriptyline (ELAVIL) 50 MG tablet Take 50 mg by mouth at bedtime.    [provider]  baclofen (LIORESAL) 10 MG tablet Take 1 tablet (10 mg total) by mouth daily. 10/13/17 10/13/18  Faythe Ghee, PA-C  Elastic Bandages & Supports (KNEE BRACE/HINGED BARS XL) MISC 1 Device by Does not apply route daily. 07/17/16   Triplett, Rulon Eisenmenger B, FNP   gabapentin (NEURONTIN) 100 MG capsule Take 1 capsule (100 mg total) by mouth 3 (three) times daily. 09/20/15 09/19/16  Phineas Semen, MD  glipiZIDE (GLUCOTROL) 5 MG tablet Take 5 mg by mouth daily before breakfast.    [provider]  ibuprofen (ADVIL,MOTRIN) 600 MG tablet Take 1 tablet (600 mg total) by mouth every 6 (six) hours as needed. 10/04/16   Nita Sickle, MD  meloxicam (MOBIC) 15 MG tablet Take 1 tablet (15 mg total) by mouth daily. 06/30/17   Cuthriell, Delorise Royals, PA-C  metFORMIN (GLUCOPHAGE) 1000 MG tablet Take 1 tablet (1,000 mg total) by mouth 2 (two) times daily with a meal. 03/29/17 03/29/18  Emily Filbert, MD  metroNIDAZOLE (FLAGYL) 500 MG tablet Take 1 tablet (500 mg total) by mouth 2 (two) times daily. 03/29/17   Emily Filbert, MD  predniSONE (STERAPRED UNI-PAK 21 TAB) 10 MG (21) TBPK tablet Take 6 pills on day one then decrease by 1 pill each day 10/13/17   Faythe Ghee, PA-C  SUMAtriptan-naproxen (TREXIMET) 85-500 MG per tablet Take 1 tablet by mouth every 2 (two) hours as needed for migraine.    [provider]  topiramate (TOPAMAX) 100 MG tablet Take 100 mg by mouth 2 (two) times daily.    [provider]  traMADol (ULTRAM) 50 MG tablet Take 1 tablet (50 mg total) by  mouth every 6 (six) hours as needed. 10/13/17   Torence Palmeri, Roselyn Bering, PA-C  triamterene-hydrochlorothiazide (MAXZIDE-25) 37.5-25 MG per tablet Take 1 tablet by mouth daily.    [provider]  verapamil (COVERA HS) 180 MG (CO) 24 hr tablet Take 180 mg by mouth at bedtime.    [provider]    Allergies Patient has no known allergies.  Family History  Problem Relation Age of Onset  . Breast cancer Neg Hx     Social History Social History   Tobacco Use  . Smoking status: Never Smoker  . Smokeless tobacco: Current User    Types: Snuff  Substance Use Topics  . Alcohol use: Yes    Comment: occasionally  . Drug use: No    Review of  Systems  Constitutional: No fever/chills Eyes: No visual changes. ENT: No sore throat. Respiratory: Denies cough Genitourinary: Negative for dysuria. Musculoskeletal: Positive for back pain with radiation to the right leg. Skin: Negative for rash.    ____________________________________________   PHYSICAL EXAM:  VITAL SIGNS: ED Triage Vitals  Enc Vitals Group     BP 10/13/17 1115 137/80     Pulse Rate 10/13/17 1115 77     Resp 10/13/17 1115 18     Temp 10/13/17 1115 97.8 F (36.6 C)     Temp Source 10/13/17 1115 Oral     SpO2 10/13/17 1115 99 %     Weight 10/13/17 1115 245 lb (111.1 kg)     Height 10/13/17 1115 5\' 6"  (1.676 m)     Head Circumference --      Peak Flow --      Pain Score 10/13/17 1122 10     Pain Loc --      Pain Edu? --      Excl. in GC? --     Constitutional: Alert and oriented. Well appearing and in no acute distress. Eyes: Conjunctivae are normal.  Head: Atraumatic. Nose: No congestion/rhinnorhea. Mouth/Throat: Mucous membranes are moist.   Neck:  supple no lymphadenopathy noted Cardiovascular: Normal rate, regular rhythm. Heart sounds are normal Respiratory: Normal respiratory effort.  No retractions, lungs c t a  Abd: soft nontender bs normal all 4 quad GU: deferred Musculoskeletal: FROM all extremities, warm and well perfused.  Patient does move slowly.  Lumbar spine is mildly tender to palpation.  The SI joint is tender to palpation.  The IT band is tender along the right side.  The C-spine and thoracic spine are not tender.  There is some spasms noted in the right shoulder.  She is neurovascularly is intact.  Grips are equal bilaterally.  She has 10 out of 10 strength in the feet and great toes bilaterally Neurologic:  Normal speech and language.  Skin:  Skin is warm, dry and intact. No rash noted. Psychiatric: Mood and affect are normal. Speech and behavior are normal.  ____________________________________________   LABS (all labs  ordered are listed, but only abnormal results are displayed)  Labs Reviewed - No data to display ____________________________________________   ____________________________________________  RADIOLOGY    ____________________________________________   PROCEDURES  Procedure(s) performed: Toradol 30 mg IM and Norflex 60 mg IM  Procedures    ____________________________________________   INITIAL IMPRESSION / ASSESSMENT AND PLAN / ED COURSE  Pertinent labs & imaging results that were available during my care of the patient were reviewed by me and considered in my medical decision making (see chart for details).   Patient is 52 year old female presents emergency department  complaining of low back pain with right-sided sciatica.  On physical exam patient appears well.  The lumbar spine and SI joint are mildly tender to palpation.  IT band is very tender to palpation.  Patient does move slowly.  She is neurovascularly intact.  Lower extremities have 10 out of 10 strength with great toes also having 10 out of 10 strength.  Toradol 30 mg IM and Norflex 60 mg IM were given to the patient.  She states she had relief with the medications.  Discussed all the findings with patient.  She was given a prescription for Sterapred and baclofen.  She is to follow-up with her regular doctor if not better in 3 to 5 days.  She is to apply ice to lower back.  She states she understands will comply with our instructions.  She discharged in stable condition     As part of my medical decision making, I reviewed the following data within the electronic MEDICAL RECORD NUMBER Nursing notes reviewed and incorporated, Old chart reviewed, Notes from prior ED visits and [place] Controlled Substance Database  ____________________________________________   FINAL CLINICAL IMPRESSION(S) / ED DIAGNOSES  Final diagnoses:  Acute bilateral low back pain with right-sided sciatica      NEW MEDICATIONS STARTED DURING  THIS VISIT:  Discharge Medication List as of 10/13/2017 12:54 PM    START taking these medications   Details  predniSONE (STERAPRED UNI-PAK 21 TAB) 10 MG (21) TBPK tablet Take 6 pills on day one then decrease by 1 pill each day, Normal    traMADol (ULTRAM) 50 MG tablet Take 1 tablet (50 mg total) by mouth every 6 (six) hours as needed., Starting Sun 10/13/2017, Normal         Note:  This document was prepared using Dragon voice recognition software and may include unintentional dictation errors.    Faythe GheeFisher, Massie Mees W, PA-C 10/13/17 1454    Jene EveryKinner, Robert, MD 10/13/17 1501

## 2017-10-13 NOTE — Discharge Instructions (Addendum)
With your regular doctor if not better in 3 to 5 days.  Return emergency department if worsening.  Take medication as prescribed.  Monitor your glucose levels as the steroid may increase them.

## 2017-12-16 ENCOUNTER — Encounter: Payer: Self-pay | Admitting: Emergency Medicine

## 2017-12-16 ENCOUNTER — Emergency Department
Admission: EM | Admit: 2017-12-16 | Discharge: 2017-12-16 | Disposition: A | Discharge status: Home / Self Care | Payer: PRIVATE HEALTH INSURANCE | Attending: Emergency Medicine | Admitting: Emergency Medicine

## 2017-12-16 ENCOUNTER — Emergency Department: Payer: PRIVATE HEALTH INSURANCE

## 2017-12-16 DIAGNOSIS — M5410 Radiculopathy, site unspecified: Secondary | ICD-10-CM | POA: Insufficient documentation

## 2017-12-16 DIAGNOSIS — E119 Type 2 diabetes mellitus without complications: Secondary | ICD-10-CM | POA: Insufficient documentation

## 2017-12-16 DIAGNOSIS — I10 Essential (primary) hypertension: Secondary | ICD-10-CM | POA: Diagnosis not present

## 2017-12-16 DIAGNOSIS — Z79899 Other long term (current) drug therapy: Secondary | ICD-10-CM | POA: Insufficient documentation

## 2017-12-16 DIAGNOSIS — M545 Low back pain: Secondary | ICD-10-CM | POA: Diagnosis present

## 2017-12-16 MED ORDER — IBUPROFEN 800 MG PO TABS: 800.0000 mg | ORAL_TABLET | Freq: Three times a day (TID) | ORAL | 0 refills | Status: DC | PRN

## 2017-12-16 MED ORDER — CYCLOBENZAPRINE HCL 10 MG PO TABS: 10.0000 mg | ORAL_TABLET | Freq: Three times a day (TID) | ORAL | 0 refills | Status: DC | PRN

## 2017-12-16 MED ORDER — HYDROMORPHONE HCL 1 MG/ML IJ SOLN
1.0000 mg | Freq: Once | INTRAMUSCULAR | Status: AC
  Administered 2017-12-16: 1 mg via INTRAMUSCULAR
  Filled 2017-12-16: qty 1

## 2017-12-16 MED ORDER — KETOROLAC TROMETHAMINE 60 MG/2ML IM SOLN
60.0000 mg | Freq: Once | INTRAMUSCULAR | Status: AC
  Administered 2017-12-16: 60 mg via INTRAMUSCULAR
  Filled 2017-12-16: qty 2

## 2017-12-16 MED ORDER — OXYCODONE-ACETAMINOPHEN 7.5-325 MG PO TABS: 1.0000 | ORAL_TABLET | Freq: Four times a day (QID) | ORAL | 0 refills | Status: DC | PRN

## 2017-12-16 MED ORDER — ORPHENADRINE CITRATE 30 MG/ML IJ SOLN
60.0000 mg | Freq: Two times a day (BID) | INTRAMUSCULAR | Status: DC
  Administered 2017-12-16: 60 mg via INTRAMUSCULAR
  Filled 2017-12-16: qty 2

## 2017-12-16 NOTE — ED Provider Notes (Signed)
[facility] Emergency Department Provider Note   ____________________________________________   First MD Initiated Contact with Patient 12/16/17 1221     (approximate)  I have reviewed the triage vital signs and the nursing notes.   HISTORY  Chief Complaint Back Pain    HPI Michele [name] is a 52 y.o. female patient complain of acute low back pain for the component to the left lower extremity.  Patient denies bladder bowel dysfunction.  Patient states similar incident 2 weeks ago on the right side.  Patient rates the pain as a 10/10.  No palliative measure for complaint.  Patient described the pain as "achy".  Past Medical History:  Diagnosis Date  . Diabetes mellitus without complication (HCC)   . Hypertension   . Migraines     There are no active problems to display for this patient.   Past Surgical History:  Procedure Laterality Date  . ABDOMINAL HYSTERECTOMY    . ANKLE SURGERY    . CESAREAN SECTION    . KNEE SURGERY    . WRIST SURGERY      Prior to Admission medications   Medication Sig Start Date End Date Taking? Authorizing Provider  albuterol (PROVENTIL HFA;VENTOLIN HFA) 108 (90 BASE) MCG/ACT inhaler Inhale 2 puffs into the lungs every 6 (six) hours as needed for wheezing or shortness of breath. 08/26/14   Schaevitz, Myra Rude, MD  amitriptyline (ELAVIL) 50 MG tablet Take 50 mg by mouth at bedtime.    [provider]  baclofen (LIORESAL) 10 MG tablet Take 1 tablet (10 mg total) by mouth daily. 10/13/17 10/13/18  Fisher, Roselyn Bering, PA-C  cyclobenzaprine (FLEXERIL) 10 MG tablet Take 1 tablet (10 mg total) by mouth 3 (three) times daily as needed. 12/16/17   Joni Reining, PA-C  Elastic Bandages & Supports (KNEE BRACE/HINGED BARS XL) MISC 1 Device by Does not apply route daily. 07/17/16   Triplett, Rulon Eisenmenger B, FNP  gabapentin (NEURONTIN) 100 MG capsule Take 1 capsule (100 mg total) by mouth 3 (three) times daily. 09/20/15 09/19/16   Phineas Semen, MD  glipiZIDE (GLUCOTROL) 5 MG tablet Take 5 mg by mouth daily before breakfast.    [provider]  ibuprofen (ADVIL,MOTRIN) 600 MG tablet Take 1 tablet (600 mg total) by mouth every 6 (six) hours as needed. 10/04/16   Nita Sickle, MD  ibuprofen (ADVIL,MOTRIN) 800 MG tablet Take 1 tablet (800 mg total) by mouth every 8 (eight) hours as needed for moderate pain. 12/16/17   Joni Reining, PA-C  meloxicam (MOBIC) 15 MG tablet Take 1 tablet (15 mg total) by mouth daily. 06/30/17   Cuthriell, Delorise Royals, PA-C  metFORMIN (GLUCOPHAGE) 1000 MG tablet Take 1 tablet (1,000 mg total) by mouth 2 (two) times daily with a meal. 03/29/17 03/29/18  Emily Filbert, MD  metroNIDAZOLE (FLAGYL) 500 MG tablet Take 1 tablet (500 mg total) by mouth 2 (two) times daily. 03/29/17   Emily Filbert, MD  oxyCODONE-acetaminophen (PERCOCET) 7.5-325 MG tablet Take 1 tablet by mouth every 6 (six) hours as needed for severe pain. 12/16/17   Joni Reining, PA-C  predniSONE (STERAPRED UNI-PAK 21 TAB) 10 MG (21) TBPK tablet Take 6 pills on day one then decrease by 1 pill each day 10/13/17   Faythe Ghee, PA-C  SUMAtriptan-naproxen (TREXIMET) 85-500 MG per tablet Take 1 tablet by mouth every 2 (two) hours as needed for migraine.    [provider]  topiramate (TOPAMAX) 100 MG tablet  Take 100 mg by mouth 2 (two) times daily.    [provider]  traMADol (ULTRAM) 50 MG tablet Take 1 tablet (50 mg total) by mouth every 6 (six) hours as needed. 10/13/17   Fisher, Roselyn Bering, PA-C  triamterene-hydrochlorothiazide (MAXZIDE-25) 37.5-25 MG per tablet Take 1 tablet by mouth daily.    [provider]  verapamil (COVERA HS) 180 MG (CO) 24 hr tablet Take 180 mg by mouth at bedtime.    [provider]    Allergies Patient has no known allergies.  Family History  Problem Relation Age of Onset  . Breast cancer Neg Hx     Social History Social History   Tobacco  Use  . Smoking status: Never Smoker  . Smokeless tobacco: Current User    Types: Snuff  Substance Use Topics  . Alcohol use: Yes    Comment: occasionally  . Drug use: No    Review of Systems Constitutional: No fever/chills Eyes: No visual changes. ENT: No sore throat. Cardiovascular: Denies chest pain. Respiratory: Denies shortness of breath. Gastrointestinal: No abdominal pain.  No nausea, no vomiting.  No diarrhea.  No constipation. Genitourinary: Negative for dysuria. Musculoskeletal: Negative for back pain. Skin: Negative for rash. Neurological: Negative for headaches, focal weakness or numbness. Endocrine:Diabetes and hypertension Hematological/Lymphatic:   ____________________________________________   PHYSICAL EXAM:  VITAL SIGNS: ED Triage Vitals  Enc Vitals Group     BP 12/16/17 1058 123/73     Pulse Rate 12/16/17 1058 82     Resp 12/16/17 1058 18     Temp 12/16/17 1058 98.5 F (36.9 C)     Temp Source 12/16/17 1058 Oral     SpO2 12/16/17 1058 98 %     Weight 12/16/17 1041 250 lb (113.4 kg)     Height 12/16/17 1041 5\' 6"  (1.676 m)     Head Circumference --      Peak Flow --      Pain Score 12/16/17 1041 10     Pain Loc --      Pain Edu? --      Excl. in GC? --    Constitutional: Alert and oriented. Well appearing and in no acute distress.  Morbid obesity Neck: No stridor. No cervical spine tenderness to palpation. Hematological/Lymphatic/Immunilogical: No cervical lymphadenopathy. Cardiovascular: Normal rate, regular rhythm. Grossly normal heart sounds.  Good peripheral circulation. Respiratory: Normal respiratory effort.  No retractions. Lungs CTAB. Gastrointestinal: Soft and nontender. No distention. No abdominal bruits. No CVA tenderness. Musculoskeletal: No obvious lumbar spine deformity.  Patient is moderate guarding palpation of L4-S1. Neurologic:  Normal speech and language. No gross focal neurologic deficits are appreciated. No gait  instability. Skin:  Skin is warm, dry and intact. No rash noted. Psychiatric: Mood and affect are normal. Speech and behavior are normal.  ____________________________________________   LABS (all labs ordered are listed, but only abnormal results are displayed)  Labs Reviewed - No data to display ____________________________________________  EKG   ____________________________________________  RADIOLOGY  ED MD interpretation:    Official radiology report(s): Dg Lumbar Spine Complete  Result Date: 12/16/2017 CLINICAL DATA:  Low back pain.  Started yesterday. EXAM: LUMBAR SPINE - COMPLETE 4+ VIEW COMPARISON:  None. FINDINGS: There is no evidence of lumbar spine fracture. Alignment is normal. Intervertebral disc spaces are maintained. IMPRESSION: Negative. Electronically Signed   By: Elige Ko   On: 12/16/2017 13:59    ____________________________________________   PROCEDURES  Procedure(s) performed: None  Procedures  Critical Care performed:  No  ____________________________________________   INITIAL IMPRESSION / ASSESSMENT AND PLAN / ED COURSE  As part of my medical decision making, I reviewed the following data within the electronic MEDICAL RECORD NUMBER    Patient presented radicular back pain which started yesterday.  Patient state similar complaint couple weeks ago.  Discussed x-ray findings of the lumbar spine with the patient showing no acute abnormalities.  Patient given discharge care instructions and advised take medication as directed.  Patient advised to follow orthopedic for definitive evaluation and treatment.      ____________________________________________   FINAL CLINICAL IMPRESSION(S) / ED DIAGNOSES  Final diagnoses:  Radicular low back pain     ED Discharge Orders         Ordered    oxyCODONE-acetaminophen (PERCOCET) 7.5-325 MG tablet  Every 6 hours PRN     12/16/17 1425    cyclobenzaprine (FLEXERIL) 10 MG tablet  3 times daily PRN      12/16/17 1425    ibuprofen (ADVIL,MOTRIN) 800 MG tablet  Every 8 hours PRN     12/16/17 1425           Note:  This document was prepared using Dragon voice recognition software and may include unintentional dictation errors.    Joni Reining, PA-C 12/16/17 1427    Governor Rooks, MD 12/16/17 [phone]

## 2017-12-16 NOTE — ED Notes (Signed)
See triage note  Presents with  pain to lower back and moves into left leg  Denies any specific injury but pain increases with bending  Denies any urinary sx's

## 2017-12-16 NOTE — ED Triage Notes (Signed)
Pt reports lower back pain that started yesterday. Denies injuries or heavy lifting reports unsure what happened. Denies urinary sx's or other sx.s

## 2017-12-18 ENCOUNTER — Other Ambulatory Visit: Payer: Self-pay

## 2017-12-18 ENCOUNTER — Emergency Department
Admission: EM | Admit: 2017-12-18 | Discharge: 2017-12-18 | Disposition: A | Discharge status: Home / Self Care | Payer: PRIVATE HEALTH INSURANCE | Attending: Emergency Medicine | Admitting: Emergency Medicine

## 2017-12-18 ENCOUNTER — Encounter: Payer: Self-pay | Admitting: Emergency Medicine

## 2017-12-18 DIAGNOSIS — M545 Low back pain, unspecified: Secondary | ICD-10-CM

## 2017-12-18 DIAGNOSIS — Z79899 Other long term (current) drug therapy: Secondary | ICD-10-CM | POA: Diagnosis not present

## 2017-12-18 DIAGNOSIS — Y939 Activity, unspecified: Secondary | ICD-10-CM | POA: Insufficient documentation

## 2017-12-18 DIAGNOSIS — Y999 Unspecified external cause status: Secondary | ICD-10-CM | POA: Insufficient documentation

## 2017-12-18 DIAGNOSIS — E119 Type 2 diabetes mellitus without complications: Secondary | ICD-10-CM | POA: Insufficient documentation

## 2017-12-18 DIAGNOSIS — Y929 Unspecified place or not applicable: Secondary | ICD-10-CM | POA: Diagnosis not present

## 2017-12-18 DIAGNOSIS — Z7984 Long term (current) use of oral hypoglycemic drugs: Secondary | ICD-10-CM | POA: Insufficient documentation

## 2017-12-18 DIAGNOSIS — I10 Essential (primary) hypertension: Secondary | ICD-10-CM | POA: Insufficient documentation

## 2017-12-18 DIAGNOSIS — X509XXA Other and unspecified overexertion or strenuous movements or postures, initial encounter: Secondary | ICD-10-CM | POA: Insufficient documentation

## 2017-12-18 DIAGNOSIS — S3992XA Unspecified injury of lower back, initial encounter: Secondary | ICD-10-CM | POA: Diagnosis present

## 2017-12-18 LAB — BASIC METABOLIC PANEL
Anion gap: 8 (ref 5–15)
BUN: 16 mg/dL (ref 6–20)
CHLORIDE: 108 mmol/L (ref 98–111)
CO2: 25 mmol/L (ref 22–32)
CREATININE: 0.97 mg/dL (ref 0.44–1.00)
Calcium: 9.6 mg/dL (ref 8.9–10.3)
GFR calc Af Amer: >60 mL/min (ref >60)
GFR calc non Af Amer: >60 mL/min (ref >60)
GLUCOSE: 138 mg/dL — AB (ref 70–99)
Potassium: 3.6 mmol/L (ref 3.5–5.1)
Sodium: 141 mmol/L (ref 135–145)

## 2017-12-18 LAB — URINALYSIS, COMPLETE (UACMP) WITH MICROSCOPIC
Bacteria, UA: NONE SEEN
Bilirubin Urine: NEGATIVE
GLUCOSE, UA: NEGATIVE mg/dL
HGB URINE DIPSTICK: NEGATIVE
Ketones, ur: NEGATIVE mg/dL
Leukocytes, UA: NEGATIVE
NITRITE: NEGATIVE
PH: 7 (ref 5.0–8.0)
PROTEIN: NEGATIVE mg/dL
SPECIFIC GRAVITY, URINE: 1.012 (ref 1.005–1.030)

## 2017-12-18 LAB — CBC
HEMATOCRIT: 44.5 % (ref 36.0–46.0)
Hemoglobin: 14.3 g/dL (ref 12.0–15.0)
MCH: 26.5 pg (ref 26.0–34.0)
MCHC: 32.1 g/dL (ref 30.0–36.0)
MCV: 82.4 fL (ref 80.0–100.0)
Platelets: 305 10*3/uL (ref 150–400)
RBC: 5.4 MIL/uL — ABNORMAL HIGH (ref 3.87–5.11)
RDW: 13.2 % (ref 11.5–15.5)
WBC: 7.8 10*3/uL (ref 4.0–10.5)
nRBC: 0 % (ref 0.0–0.2)

## 2017-12-18 MED ORDER — OXYCODONE-ACETAMINOPHEN 5-325 MG PO TABS
1.0000 | ORAL_TABLET | Freq: Once | ORAL | Status: AC
  Administered 2017-12-18: 1 via ORAL
  Filled 2017-12-18: qty 1

## 2017-12-18 MED ORDER — CARISOPRODOL 350 MG PO TABS: 350.0000 mg | ORAL_TABLET | Freq: Three times a day (TID) | ORAL | 0 refills | Status: DC | PRN

## 2017-12-18 MED ORDER — METHYLPREDNISOLONE 4 MG PO TABS: ORAL_TABLET | ORAL | 0 refills | Status: DC

## 2017-12-18 MED ORDER — ORPHENADRINE CITRATE 30 MG/ML IJ SOLN
60.0000 mg | Freq: Two times a day (BID) | INTRAMUSCULAR | Status: DC
  Administered 2017-12-18: 60 mg via INTRAMUSCULAR
  Filled 2017-12-18 (×2): qty 2

## 2017-12-18 MED ORDER — OXYCODONE-ACETAMINOPHEN 5-325 MG PO TABS
1.0000 | ORAL_TABLET | Freq: Once | ORAL | Status: DC
  Filled 2017-12-18: qty 1

## 2017-12-18 MED ORDER — PREDNISONE 20 MG PO TABS
60.0000 mg | ORAL_TABLET | Freq: Once | ORAL | Status: AC
  Administered 2017-12-18: 60 mg via ORAL
  Filled 2017-12-18: qty 3

## 2017-12-18 NOTE — ED Triage Notes (Addendum)
Pt arrived via POV with reports of 3 days of right lower back pain moving up the back, pt was seen here in flex on 10/14. Pt states the pain is worse with movement and states any touching causes her pain.  Pt is taking prescribed meds from 2 days ago, but states they are not working.   Pt denies any urinary sxs, denies injury. No hx of kidney stone.

## 2017-12-18 NOTE — ED Notes (Signed)
Pt c/o lower back pain for the past 5 days, states it started on the left lower now on the right lower. States she has had intermittent issues with lower back pain over the years and has had steroids, toradol injections, flexeril with no relief. States the pain just eventually goes away on its on.

## 2017-12-18 NOTE — ED Provider Notes (Signed)
[facility] Emergency Department Provider Note  ___________________________________________   First MD Initiated Contact with Patient 12/18/17 1533     (approximate)  I have reviewed the triage vital signs and the nursing notes.   HISTORY  Chief Complaint Back Pain   HPI Michele [name] is a 52 y.o. female with history of diabetes, hypertension and back pain who was presented to the emergency department with right lower back pain.  She says that she was here several days ago with left lower back pain but now the pain has switched to the other side.  Says that she has had ongoing pain over the past week.  Denies any radiation to her lower extremities.  Denies any loss of bowel or bladder continence.  Denies any numbness.  Says the pain radiates upward towards her thorax and is worsened with any movement or just by touching the area.  Says that the pain is a 10 out of 10 at this time.  Says been taking ibuprofen at home as well as the Flexeril which she was discharged with without relief.  Says that she had similar issues in August and was seen here in the emergency department and was given shots of Toradol as well as Norflex with relief of her pain.  Denies any recent injuries.  Denies any rash.   Past Medical History:  Diagnosis Date  . Diabetes mellitus without complication (HCC)   . Hypertension   . Migraines     There are no active problems to display for this patient.   Past Surgical History:  Procedure Laterality Date  . ABDOMINAL HYSTERECTOMY    . ANKLE SURGERY    . CESAREAN SECTION    . KNEE SURGERY    . WRIST SURGERY      Prior to Admission medications   Medication Sig Start Date End Date Taking? Authorizing Provider  albuterol (PROVENTIL HFA;VENTOLIN HFA) 108 (90 BASE) MCG/ACT inhaler Inhale 2 puffs into the lungs every 6 (six) hours as needed for wheezing or shortness of breath. 08/26/14   Elon Lomeli, Myra Rude, MD  amitriptyline  (ELAVIL) 50 MG tablet Take 50 mg by mouth at bedtime.    [provider]  baclofen (LIORESAL) 10 MG tablet Take 1 tablet (10 mg total) by mouth daily. 10/13/17 10/13/18  Fisher, Roselyn Bering, PA-C  cyclobenzaprine (FLEXERIL) 10 MG tablet Take 1 tablet (10 mg total) by mouth 3 (three) times daily as needed. 12/16/17   Joni Reining, PA-C  Elastic Bandages & Supports (KNEE BRACE/HINGED BARS XL) MISC 1 Device by Does not apply route daily. 07/17/16   Triplett, Rulon Eisenmenger B, FNP  gabapentin (NEURONTIN) 100 MG capsule Take 1 capsule (100 mg total) by mouth 3 (three) times daily. 09/20/15 09/19/16  Phineas Semen, MD  glipiZIDE (GLUCOTROL) 5 MG tablet Take 5 mg by mouth daily before breakfast.    [provider]  ibuprofen (ADVIL,MOTRIN) 600 MG tablet Take 1 tablet (600 mg total) by mouth every 6 (six) hours as needed. 10/04/16   Nita Sickle, MD  ibuprofen (ADVIL,MOTRIN) 800 MG tablet Take 1 tablet (800 mg total) by mouth every 8 (eight) hours as needed for moderate pain. 12/16/17   Joni Reining, PA-C  meloxicam (MOBIC) 15 MG tablet Take 1 tablet (15 mg total) by mouth daily. 06/30/17   Cuthriell, Delorise Royals, PA-C  metFORMIN (GLUCOPHAGE) 1000 MG tablet Take 1 tablet (1,000 mg total) by mouth 2 (two) times daily with a meal. 03/29/17 03/29/18  Mayford Knife,  Cecille Amsterdam, MD  metroNIDAZOLE (FLAGYL) 500 MG tablet Take 1 tablet (500 mg total) by mouth 2 (two) times daily. 03/29/17   Emily Filbert, MD  oxyCODONE-acetaminophen (PERCOCET) 7.5-325 MG tablet Take 1 tablet by mouth every 6 (six) hours as needed for severe pain. 12/16/17   Joni Reining, PA-C  predniSONE (STERAPRED UNI-PAK 21 TAB) 10 MG (21) TBPK tablet Take 6 pills on day one then decrease by 1 pill each day 10/13/17   Faythe Ghee, PA-C  SUMAtriptan-naproxen (TREXIMET) 85-500 MG per tablet Take 1 tablet by mouth every 2 (two) hours as needed for migraine.    [provider]  topiramate (TOPAMAX) 100 MG tablet Take 100 mg by  mouth 2 (two) times daily.    [provider]  traMADol (ULTRAM) 50 MG tablet Take 1 tablet (50 mg total) by mouth every 6 (six) hours as needed. 10/13/17   Fisher, Roselyn Bering, PA-C  triamterene-hydrochlorothiazide (MAXZIDE-25) 37.5-25 MG per tablet Take 1 tablet by mouth daily.    [provider]  verapamil (COVERA HS) 180 MG (CO) 24 hr tablet Take 180 mg by mouth at bedtime.    [provider]    Allergies Patient has no known allergies.  Family History  Problem Relation Age of Onset  . Breast cancer Neg Hx     Social History Social History   Tobacco Use  . Smoking status: Never Smoker  . Smokeless tobacco: Current User    Types: Snuff  Substance Use Topics  . Alcohol use: Yes    Comment: occasionally  . Drug use: No    Review of Systems  Constitutional: No fever/chills Eyes: No visual changes. ENT: No sore throat. Cardiovascular: Denies chest pain. Respiratory: Denies shortness of breath. Gastrointestinal: No abdominal pain.  No nausea, no vomiting.  No diarrhea.  No constipation. Genitourinary: Negative for dysuria. Musculoskeletal: As above Skin: Negative for rash. Neurological: Negative for headaches, focal weakness or numbness.   ____________________________________________   PHYSICAL EXAM:  VITAL SIGNS: ED Triage Vitals  Enc Vitals Group     BP 12/18/17 1526 136/89     Pulse Rate 12/18/17 1526 81     Resp 12/18/17 1526 18     Temp 12/18/17 1526 98.3 F (36.8 C)     Temp Source 12/18/17 1526 Oral     SpO2 12/18/17 1526 98 %     Weight 12/18/17 1522 250 lb (113.4 kg)     Height 12/18/17 1522 5\' 6"  (1.676 m)     Head Circumference --      Peak Flow --      Pain Score 12/18/17 1522 10     Pain Loc --      Pain Edu? --      Excl. in GC? --     Constitutional: Alert and oriented. Well appearing and in no acute distress. Eyes: Conjunctivae are normal.  Head: Atraumatic. Nose: No congestion/rhinnorhea. Mouth/Throat: Mucous  membranes are moist.  Neck: No stridor.   Cardiovascular: Normal rate, regular rhythm. Grossly normal heart sounds.  Respiratory: Normal respiratory effort.  No retractions. Lungs CTAB. Gastrointestinal: Soft and nontender. No distention. No CVA tenderness. Musculoskeletal: No lower extremity tenderness nor edema.  No joint effusions.  Moderate to severe tenderness palpation of midline as well as right lumbar region.  No rash overlying.  5 out of 5 strength in bilateral lower 70s.  No saddle anesthesia.  5 out of 5 strength plantarflexion of the bilateral feet.  Neurologic:  Normal speech and language. No gross focal neurologic deficits are appreciated. Skin:  Skin is warm, dry and intact. No rash noted. Psychiatric: Mood and affect are normal. Speech and behavior are normal.  ____________________________________________   LABS (all labs ordered are listed, but only abnormal results are displayed)  Labs Reviewed  URINALYSIS, COMPLETE (UACMP) WITH MICROSCOPIC - Abnormal; Notable for the following components:      Result Value   Color, Urine YELLOW (*)    APPearance CLOUDY (*)    All other components within normal limits  CBC - Abnormal; Notable for the following components:   RBC 5.40 (*)    All other components within normal limits  BASIC METABOLIC PANEL - Abnormal; Notable for the following components:   Glucose, Bld 138 (*)    All other components within normal limits   ____________________________________________  EKG   ____________________________________________  RADIOLOGY   ____________________________________________   PROCEDURES  Procedure(s) performed:   Procedures  Critical Care performed:   ____________________________________________   INITIAL IMPRESSION / ASSESSMENT AND PLAN / ED COURSE  Pertinent labs & imaging results that were available during my care of the patient were reviewed by me and considered in my medical decision making (see chart for  details).  DDX: Muscle spasm, lumbar radiculopathy, low back pain, muscle strain As part of my medical decision making, I reviewed the following data within the electronic MEDICAL RECORD NUMBER Notes from prior ED visits as well as recent x-rays of the lumbar spine from several days ago.  ----------------------------------------- 6:30 PM on 12/18/2017 -----------------------------------------  Patient says that her symptoms are improving after the shot of Norflex and Percocet pill.  Patient will be discharged with a prescription for Soma.  She knows that she should stop taking her Flexeril.  We will also give a Medrol Dosepak.  Patient to follow-up with primary care.  We will also give follow-up information for neurosurgery.  Patient understanding of the diagnosis as well as treatment plan willing to comply.  No signs of cauda equina.  No neurologic deficits.   FINAL CLINICAL IMPRESSION(S) / ED DIAGNOSES  Low back pain.  NEW MEDICATIONS STARTED DURING THIS VISIT:  New Prescriptions   No medications on file     Note:  This document was prepared using Dragon voice recognition software and may include unintentional dictation errors.     Myrna Blazer, MD 12/18/17 [phone]

## 2017-12-23 ENCOUNTER — Other Ambulatory Visit: Payer: Self-pay | Admitting: Internal Medicine

## 2017-12-23 DIAGNOSIS — Z1231 Encounter for screening mammogram for malignant neoplasm of breast: Secondary | ICD-10-CM

## 2017-12-24 ENCOUNTER — Ambulatory Visit
Admission: RE | Admit: 2017-12-24 | Discharge: 2017-12-24 | Disposition: A | Discharge status: Home / Self Care | Payer: PRIVATE HEALTH INSURANCE | Source: Ambulatory Visit | Attending: Internal Medicine | Admitting: Internal Medicine

## 2017-12-24 DIAGNOSIS — Z1231 Encounter for screening mammogram for malignant neoplasm of breast: Secondary | ICD-10-CM | POA: Insufficient documentation

## 2018-02-06 ENCOUNTER — Encounter: Payer: Self-pay | Admitting: Student in an Organized Health Care Education/Training Program

## 2018-02-06 ENCOUNTER — Ambulatory Visit
Payer: PRIVATE HEALTH INSURANCE | Attending: Student in an Organized Health Care Education/Training Program | Admitting: Student in an Organized Health Care Education/Training Program

## 2018-02-06 ENCOUNTER — Other Ambulatory Visit: Payer: Self-pay

## 2018-02-06 VITALS — BP 128/107 | HR 76 | Temp 98.0°F | Resp 18 | Ht 66.0 in | Wt 260.0 lb

## 2018-02-06 DIAGNOSIS — Z6841 Body Mass Index (BMI) 40.0 and over, adult: Secondary | ICD-10-CM | POA: Insufficient documentation

## 2018-02-06 DIAGNOSIS — I10 Essential (primary) hypertension: Secondary | ICD-10-CM | POA: Diagnosis not present

## 2018-02-06 DIAGNOSIS — M545 Low back pain, unspecified: Secondary | ICD-10-CM

## 2018-02-06 DIAGNOSIS — G894 Chronic pain syndrome: Secondary | ICD-10-CM | POA: Insufficient documentation

## 2018-02-06 DIAGNOSIS — M47816 Spondylosis without myelopathy or radiculopathy, lumbar region: Secondary | ICD-10-CM

## 2018-02-06 DIAGNOSIS — Z90710 Acquired absence of both cervix and uterus: Secondary | ICD-10-CM | POA: Diagnosis not present

## 2018-02-06 DIAGNOSIS — E119 Type 2 diabetes mellitus without complications: Secondary | ICD-10-CM | POA: Insufficient documentation

## 2018-02-06 DIAGNOSIS — Z79899 Other long term (current) drug therapy: Secondary | ICD-10-CM | POA: Insufficient documentation

## 2018-02-06 DIAGNOSIS — G8929 Other chronic pain: Secondary | ICD-10-CM

## 2018-02-06 DIAGNOSIS — G43809 Other migraine, not intractable, without status migrainosus: Secondary | ICD-10-CM

## 2018-02-06 DIAGNOSIS — Z7984 Long term (current) use of oral hypoglycemic drugs: Secondary | ICD-10-CM | POA: Diagnosis not present

## 2018-02-06 DIAGNOSIS — M5481 Occipital neuralgia: Secondary | ICD-10-CM | POA: Diagnosis not present

## 2018-02-06 DIAGNOSIS — G43909 Migraine, unspecified, not intractable, without status migrainosus: Secondary | ICD-10-CM | POA: Insufficient documentation

## 2018-02-06 DIAGNOSIS — Z9889 Other specified postprocedural states: Secondary | ICD-10-CM | POA: Insufficient documentation

## 2018-02-06 DIAGNOSIS — M542 Cervicalgia: Secondary | ICD-10-CM | POA: Diagnosis present

## 2018-02-06 MED ORDER — PREGABALIN 75 MG PO CAPS: 75.0000 mg | ORAL_CAPSULE | Freq: Three times a day (TID) | ORAL | 0 refills | Status: DC

## 2018-02-06 NOTE — Patient Instructions (Addendum)
You have been given Rx for Lyrica and have been referred for physical therapy.   Moderate Conscious Sedation, Adult Sedation is the use of medicines to promote relaxation and relieve discomfort and anxiety. Moderate conscious sedation is a type of sedation. Under moderate conscious sedation, you are less alert than normal, but you are still able to respond to instructions, touch, or both. Moderate conscious sedation is used during short medical and dental procedures. It is milder than deep sedation, which is a type of sedation under which you cannot be easily woken up. It is also milder than general anesthesia, which is the use of medicines to make you unconscious. Moderate conscious sedation allows you to return to your regular activities sooner. Tell a health care provider about:  Any allergies you have.  All medicines you are taking, including vitamins, herbs, eye drops, creams, and over-the-counter medicines.  Use of steroids (by mouth or creams).  Any problems you or family members have had with sedatives and anesthetic medicines.  Any blood disorders you have.  Any surgeries you have had.  Any medical conditions you have, such as sleep apnea.  Whether you are pregnant or may be pregnant.  Any use of cigarettes, alcohol, marijuana, or street drugs. What are the risks? Generally, this is a safe procedure. However, problems may occur, including:  Getting too much medicine (oversedation).  Nausea.  Allergic reaction to medicines.  Trouble breathing. If this happens, a breathing tube may be used to help with breathing. It will be removed when you are awake and breathing on your own.  Heart trouble.  Lung trouble.  What happens before the procedure? Staying hydrated Follow instructions from your health care provider about hydration, which may include:  Up to 2 hours before the procedure - you may continue to drink clear liquids, such as water, clear fruit juice, black  coffee, and plain tea.  Eating and drinking restrictions Follow instructions from your health care provider about eating and drinking, which may include:  8 hours before the procedure - stop eating heavy meals or foods such as meat, fried foods, or fatty foods.  6 hours before the procedure - stop eating light meals or foods, such as toast or cereal.  6 hours before the procedure - stop drinking milk or drinks that contain milk.  2 hours before the procedure - stop drinking clear liquids.  Medicine  Ask your health care provider about:  Changing or stopping your regular medicines. This is especially important if you are taking diabetes medicines or blood thinners.  Taking medicines such as aspirin and ibuprofen. These medicines can thin your blood. Do not take these medicines before your procedure if your health care provider instructs you not to.  Tests and exams  You will have a physical exam.  You may have blood tests done to show: ? How well your kidneys and liver are working. ? How well your blood can clot. General instructions  Plan to have someone take you home from the hospital or clinic.  If you will be going home right after the procedure, plan to have someone with you for 24 hours. What happens during the procedure?  An IV tube will be inserted into one of your veins.  Medicine to help you relax (sedative) will be given through the IV tube.  The medical or dental procedure will be performed. What happens after the procedure?  Your blood pressure, heart rate, breathing rate, and blood oxygen level will be monitored often  until the medicines you were given have worn off.  Do not drive for 24 hours. This information is not intended to replace advice given to you by your health care provider. Make sure you discuss any questions you have with your health care provider. Document Released: 11/14/2000 Document Revised: 07/26/2015 Document Reviewed:  06/11/2015 [employer] Interactive Patient Education  2018 [employer]   GENERAL RISKS AND COMPLICATIONS  What are the risk, side effects and possible complications? Generally speaking, most procedures are safe.  However, with any procedure there are risks, side effects, and the possibility of complications.  The risks and complications are dependent upon the sites that are lesioned, or the type of nerve block to be performed.  The closer the procedure is to the spine, the more serious the risks are.  Great care is taken when placing the radio frequency needles, block needles or lesioning probes, but sometimes complications can occur. 1. Infection: Any time there is an injection through the skin, there is a risk of infection.  This is why sterile conditions are used for these blocks.  There are four possible types of infection. 1. Localized skin infection. 2. Central Nervous System Infection-This can be in the form of Meningitis, which can be deadly. 3. Epidural Infections-This can be in the form of an epidural abscess, which can cause pressure inside of the spine, causing compression of the spinal cord with subsequent paralysis. This would require an emergency surgery to decompress, and there are no guarantees that the patient would recover from the paralysis. 4. Discitis-This is an infection of the intervertebral discs.  It occurs in about 1% of discography procedures.  It is difficult to treat and it may lead to surgery.        2. Pain: the needles have to go through skin and soft tissues, will cause soreness.       3. Damage to internal structures:  The nerves to be lesioned may be near blood vessels or    other nerves which can be potentially damaged.       4. Bleeding: Bleeding is more common if the patient is taking blood thinners such as  aspirin, Coumadin, Ticiid, Plavix, etc., or if he/she have some genetic predisposition  such as hemophilia. Bleeding into the spinal canal can cause  compression of the spinal  cord with subsequent paralysis.  This would require an emergency surgery to  decompress and there are no guarantees that the patient would recover from the  paralysis.       5. Pneumothorax:  Puncturing of a lung is a possibility, every time a needle is introduced in  the area of the chest or upper back.  Pneumothorax refers to free air around the  collapsed lung(s), inside of the thoracic cavity (chest cavity).  Another two possible  complications related to a similar event would include: Hemothorax and Chylothorax.   These are variations of the Pneumothorax, where instead of air around the collapsed  lung(s), you may have blood or chyle, respectively.       6. Spinal headaches: They may occur with any procedures in the area of the spine.       7. Persistent CSF (Cerebro-Spinal Fluid) leakage: This is a rare problem, but may occur  with prolonged intrathecal or epidural catheters either due to the formation of a fistulous  track or a dural tear.       8. Nerve damage: By working so close to the spinal cord, there is always  a possibility of  nerve damage, which could be as serious as a permanent spinal cord injury with  paralysis.       9. Death:  Although rare, severe deadly allergic reactions known as "Anaphylactic  reaction" can occur to any of the medications used.      10. Worsening of the symptoms:  We can always make thing worse.  What are the chances of something like this happening? Chances of any of this occuring are extremely low.  By statistics, you have more of a chance of getting killed in a motor vehicle accident: while driving to the hospital than any of the above occurring .  Nevertheless, you should be aware that they are possibilities.  In general, it is similar to taking a shower.  Everybody knows that you can slip, hit your head and get killed.  Does that mean that you should not shower again?  Nevertheless always keep in mind that statistics do not mean  anything if you happen to be on the wrong side of them.  Even if a procedure has a 1 (one) in a 1,000,000 (million) chance of going wrong, it you happen to be that one..Also, keep in mind that by statistics, you have more of a chance of having something go wrong when taking medications.  Who should not have this procedure? If you are on a blood thinning medication (e.g. Coumadin, Plavix, see list of "Blood Thinners"), or if you have an active infection going on, you should not have the procedure.  If you are taking any blood thinners, please inform your physician.  How should I prepare for this procedure?  Do not eat or drink anything at least six hours prior to the procedure.  Bring a driver with you .  It cannot be a taxi.  Come accompanied by an adult that can drive you back, and that is strong enough to help you if your legs get weak or numb from the local anesthetic.  Take all of your medicines the morning of the procedure with just enough water to swallow them.  If you have diabetes, make sure that you are scheduled to have your procedure done first thing in the morning, whenever possible.  If you have diabetes, take only half of your insulin dose and notify our nurse that you have done so as soon as you arrive at the clinic.  If you are diabetic, but only take blood sugar pills (oral hypoglycemic), then do not take them on the morning of your procedure.  You may take them after you have had the procedure.  Do not take aspirin or any aspirin-containing medications, at least eleven (11) days prior to the procedure.  They may prolong bleeding.  Wear loose fitting clothing that may be easy to take off and that you would not mind if it got stained with Betadine or blood.  Do not wear any jewelry or perfume  Remove any nail coloring.  It will interfere with some of our monitoring equipment.  NOTE: Remember that this is not meant to be interpreted as a complete list of all possible  complications.  Unforeseen problems may occur.  BLOOD THINNERS The following drugs contain aspirin or other products, which can cause increased bleeding during surgery and should not be taken for 2 weeks prior to and 1 week after surgery.  If you should need take something for relief of minor pain, you may take acetaminophen which is found in Tylenol,m Datril, Anacin-3 and Panadol.  It is not blood thinner. The products listed below are.  Do not take any of the products listed below in addition to any listed on your instruction sheet.  A.P.C or A.P.C with Codeine Codeine Phosphate Capsules #3 Ibuprofen Ridaura  ABC compound Congesprin Imuran rimadil  Advil Cope Indocin Robaxisal  Alka-Seltzer Effervescent Pain Reliever and Antacid Coricidin or Coricidin-D  Indomethacin Rufen  Alka-Seltzer plus Cold Medicine Cosprin Ketoprofen S-A-C Tablets  Anacin Analgesic Tablets or Capsules Coumadin Korlgesic Salflex  Anacin Extra Strength Analgesic tablets or capsules CP-2 Tablets Lanoril Salicylate  Anaprox Cuprimine Capsules Levenox Salocol  Anexsia-D Dalteparin Magan Salsalate  Anodynos Darvon compound Magnesium Salicylate Sine-off  Ansaid Dasin Capsules Magsal Sodium Salicylate  Anturane Depen Capsules Marnal Soma  APF Arthritis pain formula Dewitt's Pills Measurin Stanback  Argesic Dia-Gesic Meclofenamic Sulfinpyrazone  Arthritis Bayer Timed Release Aspirin Diclofenac Meclomen Sulindac  Arthritis pain formula Anacin Dicumarol Medipren Supac  Analgesic (Safety coated) Arthralgen Diffunasal Mefanamic Suprofen  Arthritis Strength Bufferin Dihydrocodeine Mepro Compound Suprol  Arthropan liquid Dopirydamole Methcarbomol with Aspirin Synalgos  ASA tablets/Enseals Disalcid Micrainin Tagament  Ascriptin Doan's Midol Talwin  Ascriptin A/D Dolene Mobidin Tanderil  Ascriptin Extra Strength Dolobid Moblgesic Ticlid  Ascriptin with Codeine Doloprin or Doloprin with Codeine Momentum Tolectin  Asperbuf Duoprin  Mono-gesic Trendar  Aspergum Duradyne Motrin or Motrin IB Triminicin  Aspirin plain, buffered or enteric coated Durasal Myochrisine Trigesic  Aspirin Suppositories Easprin Nalfon Trillsate  Aspirin with Codeine Ecotrin Regular or Extra Strength Naprosyn Uracel  Atromid-S Efficin Naproxen Ursinus  Auranofin Capsules Elmiron Neocylate Vanquish  Axotal Emagrin Norgesic Verin  Azathioprine Empirin or Empirin with Codeine Normiflo Vitamin E  Azolid Emprazil Nuprin Voltaren  Bayer Aspirin plain, buffered or children's or timed BC Tablets or powders Encaprin Orgaran Warfarin Sodium  Buff-a-Comp Enoxaparin Orudis Zorpin  Buff-a-Comp with Codeine Equegesic Os-Cal-Gesic   Buffaprin Excedrin plain, buffered or Extra Strength Oxalid   Bufferin Arthritis Strength Feldene Oxphenbutazone   Bufferin plain or Extra Strength Feldene Capsules Oxycodone with Aspirin   Bufferin with Codeine Fenoprofen Fenoprofen Pabalate or Pabalate-SF   Buffets II Flogesic Panagesic   Buffinol plain or Extra Strength Florinal or Florinal with Codeine Panwarfarin   Buf-Tabs Flurbiprofen Penicillamine   Butalbital Compound Four-way cold tablets Penicillin   Butazolidin Fragmin Pepto-Bismol   Carbenicillin Geminisyn Percodan   Carna Arthritis Reliever Geopen Persantine   Carprofen Gold's salt Persistin   Chloramphenicol Goody's Phenylbutazone   Chloromycetin Haltrain Piroxlcam   Clmetidine heparin Plaquenil   Cllnoril Hyco-pap Ponstel   Clofibrate Hydroxy chloroquine Propoxyphen         Before stopping any of these medications, be sure to consult the physician who ordered them.  Some, such as Coumadin (Warfarin) are ordered to prevent or treat serious conditions such as "deep thrombosis", "pumonary embolisms", and other heart problems.  The amount of time that you may need off of the medication may also vary with the medication and the reason for which you were taking it.  If you are taking any of these medications, please  make sure you notify your pain physician before you undergo any procedures.   Facet Blocks Patient Information  Description: The facets are joints in the spine between the vertebrae.  Like any joints in the body, facets can become irritated and painful.  Arthritis can also effect the facets.  By injecting steroids and local anesthetic in and around these joints, we can temporarily block the nerve supply to them.  Steroids act directly on irritated nerves  and tissues to reduce selling and inflammation which often leads to decreased pain.  Facet blocks may be done anywhere along the spine from the neck to the low back depending upon the location of your pain.   After numbing the skin with local anesthetic (like Novocaine), a small needle is passed onto the facet joints under x-ray guidance.  You may experience a sensation of pressure while this is being done.  The entire block usually lasts about 15-25 minutes.   Conditions which may be treated by facet blocks:   Low back/buttock pain  Neck/shoulder pain  Certain types of headaches  Preparation for the injection:  1. Do not eat any solid food or dairy products within 8 hours of your appointment. 2. You may drink clear liquid up to 3 hours before appointment.  Clear liquids include water, black coffee, juice or soda.  No milk or cream please. 3. You may take your regular medication, including pain medications, with a sip of water before your appointment.  Diabetics should hold regular insulin (if taken separately) and take 1/2 normal NPH dose the morning of the procedure.  Carry some sugar containing items with you to your appointment. 4. A driver must accompany you and be prepared to drive you home after your procedure. 5. Bring all your current medications with you. 6. An IV may be inserted and sedation may be given at the discretion of the physician. 7. A blood pressure cuff, EKG and other monitors will often be applied during the  procedure.  Some patients may need to have extra oxygen administered for a short period. 8. You will be asked to provide medical information, including your allergies and medications, prior to the procedure.  We must know immediately if you are taking blood thinners (like Coumadin/Warfarin) or if you are allergic to IV iodine contrast (dye).  We must know if you could possible be pregnant.  Possible side-effects:   Bleeding from needle site  Infection (rare, may require surgery)  Nerve injury (rare)  Numbness & tingling (temporary)  Difficulty urinating (rare, temporary)  Spinal headache (a headache worse with upright posture)  Light-headedness (temporary)  Pain at injection site (serveral days)  Decreased blood pressure (rare, temporary)  Weakness in arm/leg (temporary)  Pressure sensation in back/neck (temporary)   Call if you experience:   Fever/chills associated with headache or increased back/neck pain  Headache worsened by an upright position  New onset, weakness or numbness of an extremity below the injection site  Hives or difficulty breathing (go to the emergency room)  Inflammation or drainage at the injection site(s)  Severe back/neck pain greater than usual  New symptoms which are concerning to you  Please note:  Although the local anesthetic injected can often make your back or neck feel good for several hours after the injection, the pain will likely return. It takes 3-7 days for steroids to work.  You may not notice any pain relief for at least one week.  If effective, we will often do a series of 2-3 injections spaced 3-6 weeks apart to maximally decrease your pain.  After the initial series, you may be a candidate for a more permanent nerve block of the facets.  If you have any questions, please call #[phone]) [facility]

## 2018-02-06 NOTE — Progress Notes (Signed)
Safety precautions to be maintained throughout the outpatient stay will include: orient to surroundings, keep bed in low position, maintain call bell within reach at all times, provide assistance with transfer out of bed and ambulation.  

## 2018-02-06 NOTE — Progress Notes (Signed)
Patient's Name: Michele [name]  MRN: [id number]  Referring Provider: Marin Olp, PA-C  DOB: 04/01/1965  PCP: Madelyn Brunner, MD  DOS: 02/06/2018  Note by: Gillis Santa, MD  Service setting: Ambulatory outpatient  Specialty: Interventional Pain Management  Location: [facility] (AMB) Pain Management Facility  Visit type: Initial Patient Evaluation  Patient type: New Patient   Primary Reason(s) for Visit: Encounter for initial evaluation of one or more chronic problems (new to examiner) potentially causing chronic pain, and posing a threat to normal musculoskeletal function. (Level of risk: High) CC: Back Pain (lower)  HPI  Ms. Michele [name] is a 52 y.o. year old, female patient, who comes today to see [place] for the first time for an initial evaluation of her chronic pain. She has Neck pain; Migraines; Obesity, morbid, BMI 40.0-49.9 ([place]); Diabetes mellitus type 2, uncomplicated ([place]); and Bilateral occipital neuralgia on their problem list. Today she comes in for evaluation of her Back Pain (lower)  Pain Assessment: Location: Lower Back Radiating: to top of buttocks, bilaterally Onset: More than a month ago Duration: Chronic pain Quality: Constant, Aching("grabbing pain", toothache like) Severity: 9 /10 (subjective, self-reported pain score)  Note: Clear symptom exaggeration. Reported level of pain is not compatible with clinical observations. Clinically the patient looks like a 3/10 A 3/10 is viewed as "Moderate" and described as significantly interfering with activities of daily living (ADL). It becomes difficult to feed, bathe, get dressed, get on and off the toilet or to perform personal hygiene functions. Difficult to get in and out of bed or a chair without assistance. Very distracting. With effort, it can be ignored when deeply involved in activities. Information on the proper use of the pain scale provided to the patient today. When using our objective Pain Scale, levels between 6 and 10/10 are said  to belong in an emergency room, as it progressively worsens from a 6/10, described as severely limiting, requiring emergency care not usually available at an outpatient pain management facility. At a 6/10 level, communication becomes difficult and requires great effort. Assistance to reach the emergency department may be required. Facial flushing and profuse sweating along with potentially dangerous increases in heart rate and blood pressure will be evident. Effect on ADL: hurts to bend, lift, sleep and work Timing: Constant Modifying factors: uses a back brace at work, heating pad, lidocaine patches BP: (!) 128/107  HR: 76  Onset and Duration: Gradual and Present longer than 3 months Cause of pain: Unknown Severity: Getting worse, NAS-11 at its worse: 9/10, NAS-11 at its best: 9/10, NAS-11 now: 9/10 and NAS-11 on the average: 10/10 Timing: Morning, Afternoon, Night and Not influenced by the time of the day Aggravating Factors: Bending, Climbing, Kneeling, Lifiting, Prolonged sitting, Prolonged standing, Squatting, Twisting, Walking, Walking uphill, Walking downhill and Working Alleviating Factors: Hot packs, Lying down, Resting and Using a brace Associated Problems: Constipation, Dizziness, Tingling, Pain that wakes patient up and Pain that does not allow patient to sleep Quality of Pain: Aching, Constant, Dreadful, Nagging, Throbbing, Toothache-like and Uncomfortable Previous Examinations or Tests: EMG/PNCV and Neurological evaluation Previous Treatments: The patient denies NONE NOTED  The patient comes into the clinics today for the first time for a chronic pain management evaluation.  52 year old female who presents with a chief complaint of primarily axial low back pain with radiation into bilateral buttocks.  Patient states that the pain is been present for greater than 5 years but has gotten worse over the last 6 months.  Patient describes  a constant throbbing sensation in her lower lumbar  spine, left greater than right.  She states that she has difficulty sleeping and only gets about 2 hours of sleep at night.  She states that her pain is worse with extension and bending and lifting and is relieved with heat.  Of note patient is morbidly obese with a BMI of 42.  She also has type 2 diabetes with last A1c 12.1.  Patient is tried Lidoderm patches, Flexeril, ibuprofen, gabapentin, steroid medications, Soma, Toradol with some benefit.  She states that gabapentin was not very helpful.  She was seen by the neurosurgical clinic at which point they recommended lumbar spine x-rays.   Today I took the time to provide the patient with information regarding my pain practice. The patient was informed that my practice is divided into two sections: an interventional pain management section, as well as a completely separate and distinct medication management section. I explained that I have procedure days for my interventional therapies, and evaluation days for follow-ups and medication management. Because of the amount of documentation required during both, they are kept separated. This means that there is the possibility that she may be scheduled for a procedure on one day, and medication management the next. I have also informed her that because of staffing and facility limitations, I no longer take patients for medication management only. To illustrate the reasons for this, I gave the patient the example of surgeons, and how inappropriate it would be to refer a patient to his/her care, just to write for the post-surgical antibiotics on a surgery done by a different surgeon.   Because interventional pain management is my board-certified specialty, the patient was informed that joining my practice means that they are open to any and all interventional therapies. I made it clear that this does not mean that they will be forced to have any procedures done. What this means is that I believe interventional  therapies to be essential part of the diagnosis and proper management of chronic pain conditions. Therefore, patients not interested in these interventional alternatives will be better served under the care of a different practitioner.  The patient was also made aware of my Comprehensive Pain Management Safety Guidelines where by joining my practice, they limit all of their nerve blocks and joint injections to those done by our practice, for as long as we are retained to manage their care.   Historic Controlled Substance Pharmacotherapy Review  PMP and historical list of controlled substances: Soma 350 mg, quantity 15, last fill 12/19/2017 Last opioid fill: Percocet 7.5 mg, quantity 12, 12/16/2017  Not on chronic opioid therapy  Medications: The patient did not bring the medication(s) to the appointment, as requested in our "New Patient Package" Pharmacodynamics: Desired effects: Analgesia: The patient reports <50% benefit. Reported improvement in function: The patient reports medication allows her to accomplish basic ADLs. Clinically meaningful improvement in function (CMIF): Sustained CMIF goals met Perceived effectiveness: Described as relatively effective, allowing for increase in activities of daily living (ADL) Undesirable effects: Side-effects or Adverse reactions: None reported Historical Monitoring: The patient  reports that she does not use drugs. List of all UDS Test(s): No results found for: MDMA, COCAINSCRNUR, [place], [place], CANNABQUANT, [place], [place] List of other Serum/Urine Drug Screening Test(s):  No results found for: AMPHSCRSER, BARBSCRSER, BENZOSCRSER, COCAINSCRSER, COCAINSCRNUR, PCPSCRSER, PCPQUANT, THCSCRSER, THCU, CANNABQUANT, OPIATESCRSER, OXYSCRSER, PROPOXSCRSER, ETH Historical Background Evaluation: [place] PMP: Six (6) year initial data search conducted.  [employer] Department of public safety, offender search: Editor, commissioning Information) Non-contributory Risk  Assessment Profile: Aberrant behavior: None observed or detected today Risk factors for fatal opioid overdose: age 74-76 years old and Morbid obesity, signs of obstructive sleep apnea Fatal overdose hazard ratio (HR): Calculation deferred Non-fatal overdose hazard ratio (HR): Calculation deferred Risk of opioid abuse or dependence: 0.7-3.0% with doses ? 36 MME/day and 6.1-26% with doses ? 120 MME/day. Substance use disorder (SUD) risk level: See below Personal History of Substance Abuse (SUD-Substance use disorder):  Alcohol: Negative  Illegal Drugs: Negative  Rx Drugs: Negative  ORT Risk Level calculation: Low Risk Opioid Risk Tool - 02/06/18 0822      Family History of Substance Abuse   Alcohol  Negative    Illegal Drugs  Negative    Rx Drugs  Negative      Personal History of Substance Abuse   Alcohol  Negative    Illegal Drugs  Negative    Rx Drugs  Negative      Age   Age between 57-45 years   No      History of Preadolescent Sexual Abuse   History of Preadolescent Sexual Abuse  Negative or Female      Psychological Disease   Psychological Disease  Negative    Depression  Negative      Total Score   Opioid Risk Tool Scoring  0    Opioid Risk Interpretation  Low Risk      ORT Scoring interpretation table:  Score <3 = Low Risk for SUD  Score between 4-7 = Moderate Risk for SUD  Score >8 = High Risk for Opioid Abuse   PHQ-2 Depression Scale:  Total score: 0  PHQ-2 Scoring interpretation table: (Score and probability of major depressive disorder)  Score 0 = No depression  Score 1 = 15.4% Probability  Score 2 = 21.1% Probability  Score 3 = 38.4% Probability  Score 4 = 45.5% Probability  Score 5 = 56.4% Probability  Score 6 = 78.6% Probability   PHQ-9 Depression Scale:  Total score: 0  PHQ-9 Scoring interpretation table:  Score 0-4 = No depression  Score 5-9 = Mild depression  Score 10-14 = Moderate depression  Score 15-19 = Moderately severe depression    Score 20-27 = Severe depression (2.4 times higher risk of SUD and 2.89 times higher risk of overuse)   Pharmacologic Plan: Non-opioid analgesic therapy offered.            Initial impression: Poor candidate for opioid analgesics.  Meds   Current Outpatient Medications:  .  albuterol (PROVENTIL HFA;VENTOLIN HFA) 108 (90 BASE) MCG/ACT inhaler, Inhale 2 puffs into the lungs every 6 (six) hours as needed for wheezing or shortness of breath., Disp: 1 Inhaler, Rfl: 2 .  amitriptyline (ELAVIL) 50 MG tablet, Take 50 mg by mouth at bedtime., Disp: , Rfl:  .  glipiZIDE (GLUCOTROL) 5 MG tablet, Take 5 mg by mouth daily before breakfast., Disp: , Rfl:  .  ibuprofen (ADVIL,MOTRIN) 800 MG tablet, Take 1 tablet (800 mg total) by mouth every 8 (eight) hours as needed for moderate pain., Disp: 15 tablet, Rfl: 0 .  lidocaine (LIDODERM) 5 %, Place 1 patch onto the skin daily. Remove & Discard patch within 12 hours or as directed by MD, Disp: , Rfl:  .  metFORMIN (GLUCOPHAGE) 1000 MG tablet, Take 1 tablet (1,000 mg total) by mouth 2 (two) times daily with a meal., Disp: 60 tablet, Rfl: 11 .  SUMAtriptan-naproxen (  TREXIMET) 85-500 MG per tablet, Take 1 tablet by mouth every 2 (two) hours as needed for migraine., Disp: , Rfl:  .  topiramate (TOPAMAX) 100 MG tablet, Take 100 mg by mouth 2 (two) times daily., Disp: , Rfl:  .  triamterene-hydrochlorothiazide (MAXZIDE-25) 37.5-25 MG per tablet, Take 1 tablet by mouth daily., Disp: , Rfl:  .  verapamil (COVERA HS) 180 MG (CO) 24 hr tablet, Take 180 mg by mouth at bedtime., Disp: , Rfl:  .  pregabalin (LYRICA) 75 MG capsule, Take 1 capsule (75 mg total) by mouth 3 (three) times daily., Disp: 90 capsule, Rfl: 0  Imaging Review   Thoracic DG 2-3 views:  Results for orders placed during the hospital encounter of 08/19/14  DG Thoracic Spine 2 View   Narrative CLINICAL DATA:  One day history of severe dorsalgia  EXAM: THORACIC SPINE - 3 VIEW  COMPARISON:   None.  FINDINGS: Frontal, lateral, and swimmer's views obtained. No fracture or spondylolisthesis. Disc spaces appear intact. No erosive change.  IMPRESSION: No fracture or spondylolisthesis.  No appreciable arthropathy.   Electronically Signed   By: Lowella Grip III M.D.   On: 08/19/2014 16:27     Lumbar DG 2-3 views:  Results for orders placed during the hospital encounter of 08/19/14  DG Lumbar Spine 2-3 Views   Narrative CLINICAL DATA:  Severe back pain from shoulders to legs, no injury  EXAM: LUMBAR SPINE - 2-3 VIEW  COMPARISON:  03/24/2013  FINDINGS: Five non-rib-bearing lumbar vertebra.  Osseous mineralization grossly normal for technique.  Vertebral body and disc space heights maintained.  No acute fracture, subluxation or bone destruction.  No spondylolysis.  SI joints symmetric.  IMPRESSION: Normal exam.   Electronically Signed   By: Lavonia Dana M.D.   On: 08/19/2014 16:27    Lumbar DG (Complete) 4+V:  Results for orders placed during the hospital encounter of 12/16/17  DG Lumbar Spine Complete   Narrative CLINICAL DATA:  Low back pain.  Started yesterday.  EXAM: LUMBAR SPINE - COMPLETE 4+ VIEW  COMPARISON:  None.  FINDINGS: There is no evidence of lumbar spine fracture. Alignment is normal. Intervertebral disc spaces are maintained.  IMPRESSION: Negative.   Electronically Signed   By: Kathreen Devoid   On: 12/16/2017 13:59    Knee-L MR w contrast:  Results for orders placed during the hospital encounter of 01/02/16  MR KNEE LEFT WO CONTRAST   Narrative CLINICAL DATA:  Left anterior knee pain.  EXAM: MRI OF THE LEFT KNEE WITHOUT CONTRAST  TECHNIQUE: Multiplanar, multisequence MR imaging of the knee was performed. No intravenous contrast was administered.  COMPARISON:  None.  FINDINGS: MENISCI  Medial meniscus: Small radial tear of the free edge of the body of the medial meniscus.  Lateral meniscus:   Intact.  LIGAMENTS  Cruciates:  Intact ACL and PCL.  Collaterals: Medial collateral ligament is intact. Lateral collateral ligament complex is intact.  CARTILAGE  Patellofemoral: High-grade partial-thickness cartilage loss of the medial and lateral patellofemoral compartment with subchondral reactive marrow changes in the lateral trochlea.  Medial: Partial-thickness cartilage loss of the medial femoral condyle and medial tibial plateau with a small area of high-grade partial-thickness cartilage loss in the medial tibial plateau.  Lateral: Cartilage irregularity with partial thickness cartilage loss of the lateral tibial plateau with subchondral reactive marrow changes. Cartilage irregularity of the lateral femoral condyle.  Joint: Small joint effusion. No plical thickening. Normal Hoffa's fat.  Popliteal Fossa:  No Baker cyst.  Intact popliteus tendon.  Extensor Mechanism: Intact quadriceps tendon and patellar tendon. Intact medial and lateral patellar retinaculum. Intact MPFL.  Bones: No marrow signal abnormality. No fracture or dislocation. Tricompartmental marginal osteophytes.  Other: No fluid collection or hematoma.  IMPRESSION: 1. Tricompartmental cartilage abnormalities as described above. 2. Small radial tear of the free edge of the body of the medial meniscus.   Electronically Signed   By: Kathreen Devoid   On: 01/02/2016 14:26     Knee-L DG 4 views:  Results for orders placed during the hospital encounter of 06/30/17  DG Knee Complete 4 Views Left   Narrative CLINICAL DATA:  Patient reports increased left knee pain over past 1-2 weeks. Denies any recent injury to left knee, however patient reports multiple falls over past 2 weeks. Patient reports hx of left knee surgery.  EXAM: LEFT KNEE - COMPLETE 4+ VIEW  COMPARISON:  Knee MRI, 01/02/2016  FINDINGS: No fracture.  No bone lesion.  Mild narrowing of the patellofemoral joint space compartment.  There are marginal osteophytes from all 3 compartments.  No joint effusion.  Surrounding soft tissues are unremarkable.  IMPRESSION: 1. No fracture or acute finding.  No joint effusion. 2. Degenerative changes involving all 3 compartments, as noted on the prior knee MRI.   Electronically Signed   By: Lajean Manes M.D.   On: 06/30/2017 16:32    Ankle Imaging: Ankle-R DG Complete:  Results for orders placed during the hospital encounter of 10/03/14  DG Ankle Complete Right   Narrative CLINICAL DATA:  Right ankle pain, surgery 3 years ago  EXAM: RIGHT ANKLE - COMPLETE 3+ VIEW  COMPARISON:  06/18/2010  FINDINGS: No fracture or dislocation is seen.  Healed deformity of the distal fibula. Postsurgical changes related to prior fixation involving the distal tibia and fibula.  Mild degenerative changes the tibiotalar joint, likely posttraumatic.  The base of the fifth metatarsal is unremarkable.  Mild soft tissue swelling.  IMPRESSION: No fracture or dislocation is seen.   Electronically Signed   By: Julian Hy M.D.   On: 10/03/2014 17:37      Complexity Note: Imaging results reviewed. Results shared with Ms. Nier, using Layman's terms.                         ROS  Cardiovascular: High blood pressure Pulmonary or Respiratory: Snoring  Neurological: No reported neurological signs or symptoms such as seizures, abnormal skin sensations, urinary and/or fecal incontinence, being born with an abnormal open spine and/or a tethered spinal cord Review of Past Neurological Studies:  Results for orders placed or performed during the hospital encounter of 02/24/17  CT Head Wo Contrast   Narrative   CLINICAL DATA:  52 year old female with headache  EXAM: CT HEAD WITHOUT CONTRAST  TECHNIQUE: Contiguous axial images were obtained from the base of the skull through the vertex without intravenous contrast.  COMPARISON:  None.  FINDINGS: Brain: No evidence  of acute infarction, hemorrhage, hydrocephalus, extra-axial collection or mass lesion/mass effect.  Vascular: No hyperdense vessel or unexpected calcification.  Skull: Normal. Negative for fracture or focal lesion.  Sinuses/Orbits: No acute finding.  Other: None.  IMPRESSION: Negative head CT.   Electronically Signed   By: Jacqulynn Cadet M.D.   On: 02/24/2017 10:02    Psychological-Psychiatric: No reported psychological or psychiatric signs or symptoms such as difficulty sleeping, anxiety, depression, delusions or hallucinations (schizophrenial), mood swings (bipolar disorders) or suicidal ideations or attempts Gastrointestinal:  No reported gastrointestinal signs or symptoms such as vomiting or evacuating blood, reflux, heartburn, alternating episodes of diarrhea and constipation, inflamed or scarred liver, or pancreas or irrregular and/or infrequent bowel movements Genitourinary: No reported renal or genitourinary signs or symptoms such as difficulty voiding or producing urine, peeing blood, non-functioning kidney, kidney stones, difficulty emptying the bladder, difficulty controlling the flow of urine, or chronic kidney disease Hematological: No reported hematological signs or symptoms such as prolonged bleeding, low or poor functioning platelets, bruising or bleeding easily, hereditary bleeding problems, low energy levels due to low hemoglobin or being anemic Endocrine: High blood sugar requiring insulin (IDDM) Rheumatologic: No reported rheumatological signs and symptoms such as fatigue, joint pain, tenderness, swelling, redness, heat, stiffness, decreased range of motion, with or without associated rash Musculoskeletal: Negative for myasthenia gravis, muscular dystrophy, multiple sclerosis or malignant hyperthermia Work History: Working full time  Allergies  Ms. Dixson has No Known Allergies.  Laboratory Chemistry  Inflammation Markers (CRP: Acute Phase) (ESR: Chronic  Phase) No results found for: CRP, ESRSEDRATE, LATICACIDVEN                       Rheumatology Markers No results found for: RF, ANA, LABURIC, URICUR, LYMEIGGIGMAB, LYMEABIGMQN, HLAB27                      Renal Function Markers Lab Results  Component Value Date   BUN 16 12/18/2017   CREATININE 0.97 12/18/2017   GFRAA >60 12/18/2017   GFRNONAA >60 12/18/2017                             Hepatic Function Markers Lab Results  Component Value Date   AST 70 (H) 04/03/2017   ALT 52 04/03/2017   ALBUMIN 3.9 04/03/2017   ALKPHOS 76 04/03/2017   LIPASE 27 04/03/2017                        Electrolytes Lab Results  Component Value Date   NA 141 12/18/2017   K 3.6 12/18/2017   CL 108 12/18/2017   CALCIUM 9.6 12/18/2017   MG 1.9 09/21/2015                        Neuropathy Markers No results found for: VITAMINB12, FOLATE, HGBA1C, HIV                      CNS Tests No results found for: COLORCSF, APPEARCSF, RBCCOUNTCSF, WBCCSF, POLYSCSF, LYMPHSCSF, EOSCSF, PROTEINCSF, GLUCCSF, JCVIRUS, CSFOLI, IGGCSF                      Bone Pathology Markers No results found for: VD25OH, [id number], [id number], [id number], 25OHVITD1, 25OHVITD2, 25OHVITD3, TESTOFREE, TESTOSTERONE                       Coagulation Parameters Lab Results  Component Value Date   PLT 305 12/18/2017                        Cardiovascular Markers Lab Results  Component Value Date   BNP 8.0 08/26/2014   CKTOTAL 78 09/07/2013   CKMB 0.5 09/07/2013   TROPONINI <0.03 09/01/2017   HGB 14.3 12/18/2017   HCT 44.5 12/18/2017  CA Markers No results found for: CEA, CA125, LABCA2                      Note: Lab results reviewed.  [place]  Drug: Ms. Gomez  reports that she does not use drugs. Alcohol:  reports that she drinks alcohol. Tobacco:  reports that she has never smoked. Her smokeless tobacco use includes snuff. Medical:  has a past medical history of Diabetes mellitus without  complication ([place]), Hypertension, and Migraines. Family: family history is not on file.  Past Surgical History:  Procedure Laterality Date  . ABDOMINAL HYSTERECTOMY    . ANKLE SURGERY    . CESAREAN SECTION    . KNEE SURGERY    . WRIST SURGERY     Active Ambulatory Problems    Diagnosis Date Noted  . Neck pain 12/16/2014  . Migraines 04/28/2014  . Obesity, morbid, BMI 40.0-49.9 ([place]) 07/12/2017  . Diabetes mellitus type 2, uncomplicated ([place]) 16/12/9602  . Bilateral occipital neuralgia 09/14/2016   Resolved Ambulatory Problems    Diagnosis Date Noted  . No Resolved Ambulatory Problems   Past Medical History:  Diagnosis Date  . Diabetes mellitus without complication ([place])   . Hypertension    Constitutional Exam  General appearance: alert, cooperative and morbidly obese Vitals:   02/06/18 0807  BP: (!) 128/107  Pulse: 76  Resp: 18  Temp: 98 F (36.7 C)  TempSrc: Oral  SpO2: 100%  Weight: 260 lb (117.9 kg)  Height: _0  (1.676 m)   BMI Assessment: Estimated body mass index is 41.97 kg/m as calculated from the following:   Height as of this encounter: _1  (1.676 m).   Weight as of this encounter: 260 lb (117.9 kg).  BMI interpretation table: BMI level Category Range association with higher incidence of chronic pain  <18 kg/m2 Underweight   18.5-24.9 kg/m2 Ideal body weight   25-29.9 kg/m2 Overweight Increased incidence by 20%  30-34.9 kg/m2 Obese (Class I) Increased incidence by 68%  35-39.9 kg/m2 Severe obesity (Class II) Increased incidence by 136%  >40 kg/m2 Extreme obesity (Class III) Increased incidence by 254%   Patient's current BMI Ideal Body weight  Body mass index is 41.97 kg/m. Ideal body weight: 59.3 kg (130 lb 11.7 oz) Adjusted ideal body weight: 82.8 kg (182 lb 7 oz)   BMI Readings from Last 4 Encounters:  02/06/18 41.97 kg/m  12/18/17 40.35 kg/m  12/16/17 40.35 kg/m  10/13/17 39.54 kg/m   Wt Readings from Last 4 Encounters:   02/06/18 260 lb (117.9 kg)  12/18/17 250 lb (113.4 kg)  12/16/17 250 lb (113.4 kg)  10/13/17 245 lb (111.1 kg)  Psych/Mental status: Alert, oriented x 3 (person, place, & time)       Eyes: PERLA Respiratory: No evidence of acute respiratory distress  Cervical Spine Area Exam  Skin & Axial Inspection: No masses, redness, edema, swelling, or associated skin lesions Alignment: Symmetrical Functional ROM: Unrestricted ROM      Stability: No instability detected Muscle Tone/Strength: Functionally intact. No obvious neuro-muscular anomalies detected. Sensory (Neurological): Unimpaired Palpation: No palpable anomalies              Upper Extremity (UE) Exam    Side: Right upper extremity  Side: Left upper extremity  Skin & Extremity Inspection: Skin color, temperature, and hair growth are WNL. No peripheral edema or cyanosis. No masses, redness, swelling, asymmetry, or associated skin lesions. No contractures.  Skin & Extremity Inspection: Skin color, temperature,  and hair growth are WNL. No peripheral edema or cyanosis. No masses, redness, swelling, asymmetry, or associated skin lesions. No contractures.  Functional ROM: Unrestricted ROM          Functional ROM: Unrestricted ROM          Muscle Tone/Strength: Functionally intact. No obvious neuro-muscular anomalies detected.  Muscle Tone/Strength: Functionally intact. No obvious neuro-muscular anomalies detected.  Sensory (Neurological): Unimpaired          Sensory (Neurological): Unimpaired          Palpation: No palpable anomalies              Palpation: No palpable anomalies              Provocative Test(s):  Phalen's test: deferred Tinel's test: deferred Apley's scratch test (touch opposite shoulder):  Action 1 (Across chest): deferred Action 2 (Overhead): deferred Action 3 (LB reach): deferred   Provocative Test(s):  Phalen's test: deferred Tinel's test: deferred Apley's scratch test (touch opposite shoulder):  Action 1 (Across  chest): deferred Action 2 (Overhead): deferred Action 3 (LB reach): deferred    Thoracic Spine Area Exam  Skin & Axial Inspection: No masses, redness, or swelling Alignment: Symmetrical Functional ROM: Unrestricted ROM Stability: No instability detected Muscle Tone/Strength: Functionally intact. No obvious neuro-muscular anomalies detected. Sensory (Neurological): Unimpaired Muscle strength & Tone: No palpable anomalies  Lumbar Spine Area Exam  Skin & Axial Inspection: No masses, redness, or swelling Alignment: Symmetrical Functional ROM: Decreased ROM affecting both sides Stability: No instability detected Muscle Tone/Strength: Functionally intact. No obvious neuro-muscular anomalies detected. Sensory (Neurological): Musculoskeletal pain pattern Palpation: Complains of area being tender to palpation       Provocative Tests: Hyperextension/rotation test: (+) bilaterally for facet joint pain. Lumbar quadrant test (Kemp's test): (+) bilaterally for facet joint pain. Lateral bending test: (+) due to pain. Patrick's Maneuver: (+) due to pain             FABER test: deferred today                   S-I anterior distraction/compression test: deferred today         S-I lateral compression test: deferred today         S-I Thigh-thrust test: deferred today         S-I Gaenslen's test: deferred today          Gait & Posture Assessment  Ambulation: Unassisted Gait: Relatively normal for age and body habitus Posture: WNL   Lower Extremity Exam    Side: Right lower extremity  Side: Left lower extremity  Stability: No instability observed          Stability: No instability observed          Skin & Extremity Inspection: Skin color, temperature, and hair growth are WNL. No peripheral edema or cyanosis. No masses, redness, swelling, asymmetry, or associated skin lesions. No contractures.  Skin & Extremity Inspection: Skin color, temperature, and hair growth are WNL. No peripheral edema or  cyanosis. No masses, redness, swelling, asymmetry, or associated skin lesions. No contractures.  Functional ROM: Decreased ROM for hip and knee joints          Functional ROM: Decreased ROM for hip and knee joints          Muscle Tone/Strength: Deconditioned  Muscle Tone/Strength: Deconditioned  Sensory (Neurological): Unimpaired        Sensory (Neurological): Unimpaired        DTR: Patellar:  1+: trace Achilles: 1+: trace Plantar: deferred today  DTR: Patellar: 1+: trace Achilles: 1+: trace Plantar: deferred today  Palpation: No palpable anomalies  Palpation: No palpable anomalies   Assessment  Primary Diagnosis & Pertinent Problem List: The primary encounter diagnosis was Chronic bilateral low back pain without sciatica. Diagnoses of Lumbar facet joint syndrome, Obesity, morbid, BMI 40.0-49.9 ([place]), Bilateral occipital neuralgia, Other migraine without status migrainosus, not intractable, and Chronic pain syndrome were also pertinent to this visit.  Visit Diagnosis (New problems to examiner): 1. Chronic bilateral low back pain without sciatica   2. Lumbar facet joint syndrome   3. Obesity, morbid, BMI 40.0-49.9 ([place]iv[place])   4. Bilateral occipital neuralgia   5. Other migraine without status migrainosus, not intractable   6. Chronic pain syndrome    52 year old female with history of morbid obesity (BMI 41.3), type 2 diabetes (A1c 12.1) with a history of axial low back pain with radiation into bilateral buttocks.  This is largely secondary to her weight and there is a large musculoskeletal component to her pain.  Patient is fairly deconditioned as well.  Physical therapy was recommended but the patient was unable to perform due to pain.  I recommend the patient try aquatic therapy as that should be lower impact on her joints.  I will place a referral.  Encouraged the patient to consider weight loss clinic.  On physical exam, the patient does have pain with lumbar extension and facet loading  as well as lateral rotation.  This could be suggestive of facet pathology and we discussed diagnostic lumbar facet medial branch nerve blocks.  Patient does have an A1c of 12.1 so we will use minimal steroids at each of the facet joints and if effective can hopefully perform radiofrequency ablation of those nerves.  Regards to medication management, patient will not be a candidate for chronic opioid therapy given musculoskeletal component of her pain, pathology that does not warrant chronic opioid therapy, morbid obesity, signs of obstructive sleep apnea.  Can trial non-opioid analgesics with recommendations as below.  Since the patient has not received benefit with gabapentin, we can trial Lyrica as below.  Ordered Lab-work, Procedure(s), Referral(s), & Consult(s): Orders Placed This Encounter  Procedures  . LUMBAR FACET(MEDIAL BRANCH NERVE BLOCK) MBNB  . Compliance Drug Analysis, Ur  . Ambulatory referral to Physical Therapy   Pharmacotherapy (current): Medications ordered:  Meds ordered this encounter  Medications  . pregabalin (LYRICA) 75 MG capsule    Sig: Take 1 capsule (75 mg total) by mouth 3 (three) times daily.    Dispense:  90 capsule    Refill:  0    Do not place this medication, or any other prescription from our practice, on "Automatic Refill". Patient may have prescription filled one day early if pharmacy is closed on scheduled refill date.   Medications administered during this visit: Easton A. Graybeal had no medications administered during this visit.   Pharmacological management options:  Opioid Analgesics: Not a candidate, NA given musculoskeletal component of her pain, pathology that does not warrant chronic opioid therapy, morbid obesity, signs of obstructive sleep apnea.  Membrane stabilizer: On Topamax for migraines.  Has tried gabapentin, not effective.  Will trial Lyrica today.  Muscle relaxant: Has tried Flexeril, Soma in the past.  Can consider tizanidine,  Robaxin.  NSAID: Currently on ibuprofen 800 mg twice daily as needed  Other analgesic(s): To be determined at a later time   Interventional management options: Ms. Barkan was informed that there  is no guarantee that she would be a candidate for interventional therapies. The decision will be based on the results of diagnostic studies, as well as Ms. Tamburo's risk profile.  Procedure(s) under consideration:  Lumbar facet medial branch nerve blocks   Provider-requested follow-up: No follow-ups on file.  Future Appointments  Date Time Provider [place]  02/19/2018  1:15 PM Gillis Santa, MD [facility] None    Primary Care Physician: Madelyn Brunner, MD Location: [facility] Note by: Gillis Santa, M.D, Date: 02/06/2018; Time: 9:29 AM  Patient Instructions   You have been given Rx for Lyrica and have been referred for physical therapy.   Moderate Conscious Sedation, Adult Sedation is the use of medicines to promote relaxation and relieve discomfort and anxiety. Moderate conscious sedation is a type of sedation. Under moderate conscious sedation, you are less alert than normal, but you are still able to respond to instructions, touch, or both. Moderate conscious sedation is used during short medical and dental procedures. It is milder than deep sedation, which is a type of sedation under which you cannot be easily woken up. It is also milder than general anesthesia, which is the use of medicines to make you unconscious. Moderate conscious sedation allows you to return to your regular activities sooner. Tell a health care provider about:  Any allergies you have.  All medicines you are taking, including vitamins, herbs, eye drops, creams, and over-the-counter medicines.  Use of steroids (by mouth or creams).  Any problems you or family members have had with sedatives and anesthetic medicines.  Any blood disorders you have.  Any surgeries you have  had.  Any medical conditions you have, such as sleep apnea.  Whether you are pregnant or may be pregnant.  Any use of cigarettes, alcohol, marijuana, or street drugs. What are the risks? Generally, this is a safe procedure. However, problems may occur, including:  Getting too much medicine (oversedation).  Nausea.  Allergic reaction to medicines.  Trouble breathing. If this happens, a breathing tube may be used to help with breathing. It will be removed when you are awake and breathing on your own.  Heart trouble.  Lung trouble.  What happens before the procedure? Staying hydrated Follow instructions from your health care provider about hydration, which may include:  Up to 2 hours before the procedure - you may continue to drink clear liquids, such as water, clear fruit juice, black coffee, and plain tea.  Eating and drinking restrictions Follow instructions from your health care provider about eating and drinking, which may include:  8 hours before the procedure - stop eating heavy meals or foods such as meat, fried foods, or fatty foods.  6 hours before the procedure - stop eating light meals or foods, such as toast or cereal.  6 hours before the procedure - stop drinking milk or drinks that contain milk.  2 hours before the procedure - stop drinking clear liquids.  Medicine  Ask your health care provider about:  Changing or stopping your regular medicines. This is especially important if you are taking diabetes medicines or blood thinners.  Taking medicines such as aspirin and ibuprofen. These medicines can thin your blood. Do not take these medicines before your procedure if your health care provider instructs you not to.  Tests and exams  You will have a physical exam.  You may have blood tests done to show: ? How well your kidneys and liver are working. ? How well  your blood can clot. General instructions  Plan to have someone take you home from the  hospital or clinic.  If you will be going home right after the procedure, plan to have someone with you for 24 hours. What happens during the procedure?  An IV tube will be inserted into one of your veins.  Medicine to help you relax (sedative) will be given through the IV tube.  The medical or dental procedure will be performed. What happens after the procedure?  Your blood pressure, heart rate, breathing rate, and blood oxygen level will be monitored often until the medicines you were given have worn off.  Do not drive for 24 hours. This information is not intended to replace advice given to you by your health care provider. Make sure you discuss any questions you have with your health care provider. Document Released: 11/14/2000 Document Revised: 07/26/2015 Document Reviewed: 06/11/2015 Elsevier Interactive Patient Education  2018 [place]  What are the risk, side effects and possible complications? Generally speaking, most procedures are safe.  However, with any procedure there are risks, side effects, and the possibility of complications.  The risks and complications are dependent upon the sites that are lesioned, or the type of nerve block to be performed.  The closer the procedure is to the spine, the more serious the risks are.  Great care is taken when placing the radio frequency needles, block needles or lesioning probes, but sometimes complications can occur. 1. Infection: Any time there is an injection through the skin, there is a risk of infection.  This is why sterile conditions are used for these blocks.  There are four possible types of infection. 1. Localized skin infection. 2. Central Nervous System Infection-This can be in the form of Meningitis, which can be deadly. 3. Epidural Infections-This can be in the form of an epidural abscess, which can cause pressure inside of the spine, causing compression of the spinal cord with  subsequent paralysis. This would require an emergency surgery to decompress, and there are no guarantees that the patient would recover from the paralysis. 4. Discitis-This is an infection of the intervertebral discs.  It occurs in about 1% of discography procedures.  It is difficult to treat and it may lead to surgery.        2. Pain: the needles have to go through skin and soft tissues, will cause soreness.       3. Damage to internal structures:  The nerves to be lesioned may be near blood vessels or    other nerves which can be potentially damaged.       4. Bleeding: Bleeding is more common if the patient is taking blood thinners such as  aspirin, Coumadin, Ticiid, Plavix, etc., or if he/she have some genetic predisposition  such as hemophilia. Bleeding into the spinal canal can cause compression of the spinal  cord with subsequent paralysis.  This would require an emergency surgery to  decompress and there are no guarantees that the patient would recover from the  paralysis.       5. Pneumothorax:  Puncturing of a lung is a possibility, every time a needle is introduced in  the area of the chest or upper back.  Pneumothorax refers to free air around the  collapsed lung(s), inside of the thoracic cavity (chest cavity).  Another two possible  complications related to a similar event would include: Hemothorax and Chylothorax.   These are variations of  the Pneumothorax, where instead of air around the collapsed  lung(s), you may have blood or chyle, respectively.       6. Spinal headaches: They may occur with any procedures in the area of the spine.       7. Persistent CSF (Cerebro-Spinal Fluid) leakage: This is a rare problem, but may occur  with prolonged intrathecal or epidural catheters either due to the formation of a fistulous  track or a dural tear.       8. Nerve damage: By working so close to the spinal cord, there is always a possibility of  nerve damage, which could be as serious as a permanent  spinal cord injury with  paralysis.       9. Death:  Although rare, severe deadly allergic reactions known as "Anaphylactic  reaction" can occur to any of the medications used.      10. Worsening of the symptoms:  We can always make thing worse.  What are the chances of something like this happening? Chances of any of this occuring are extremely low.  By statistics, you have more of a chance of getting killed in a motor vehicle accident: while driving to the hospital than any of the above occurring .  Nevertheless, you should be aware that they are possibilities.  In general, it is similar to taking a shower.  Everybody knows that you can slip, hit your head and get killed.  Does that mean that you should not shower again?  Nevertheless always keep in mind that statistics do not mean anything if you happen to be on the wrong side of them.  Even if a procedure has a 1 (one) in a 1,000,000 (million) chance of going wrong, it you happen to be that one..Also, keep in mind that by statistics, you have more of a chance of having something go wrong when taking medications.  Who should not have this procedure? If you are on a blood thinning medication (e.g. Coumadin, Plavix, see list of "Blood Thinners"), or if you have an active infection going on, you should not have the procedure.  If you are taking any blood thinners, please inform your physician.  How should I prepare for this procedure?  Do not eat or drink anything at least six hours prior to the procedure.  Bring a driver with you .  It cannot be a taxi.  Come accompanied by an adult that can drive you back, and that is strong enough to help you if your legs get weak or numb from the local anesthetic.  Take all of your medicines the morning of the procedure with just enough water to swallow them.  If you have diabetes, make sure that you are scheduled to have your procedure done first thing in the morning, whenever possible.  If you have  diabetes, take only half of your insulin dose and notify our nurse that you have done so as soon as you arrive at the clinic.  If you are diabetic, but only take blood sugar pills (oral hypoglycemic), then do not take them on the morning of your procedure.  You may take them after you have had the procedure.  Do not take aspirin or any aspirin-containing medications, at least eleven (11) days prior to the procedure.  They may prolong bleeding.  Wear loose fitting clothing that may be easy to take off and that you would not mind if it got stained with Betadine or blood.  Do not wear any  jewelry or perfume  Remove any nail coloring.  It will interfere with some of our monitoring equipment.  NOTE: Remember that this is not meant to be interpreted as a complete list of all possible complications.  Unforeseen problems may occur.  BLOOD THINNERS The following drugs contain aspirin or other products, which can cause increased bleeding during surgery and should not be taken for 2 weeks prior to and 1 week after surgery.  If you should need take something for relief of minor pain, you may take acetaminophen which is found in Tylenol,m Datril, Anacin-3 and Panadol. It is not blood thinner. The products listed below are.  Do not take any of the products listed below in addition to any listed on your instruction sheet.  A.P.C or A.P.C with Codeine Codeine Phosphate Capsules #3 Ibuprofen Ridaura  ABC compound Congesprin Imuran rimadil  Advil Cope Indocin Robaxisal  Alka-Seltzer Effervescent Pain Reliever and Antacid Coricidin or Coricidin-D  Indomethacin Rufen  Alka-Seltzer plus Cold Medicine Cosprin Ketoprofen S-A-C Tablets  Anacin Analgesic Tablets or Capsules Coumadin Korlgesic Salflex  Anacin Extra Strength Analgesic tablets or capsules CP-2 Tablets Lanoril Salicylate  Anaprox Cuprimine Capsules Levenox Salocol  Anexsia-D Dalteparin Magan Salsalate  Anodynos Darvon compound Magnesium Salicylate  Sine-off  Ansaid Dasin Capsules Magsal Sodium Salicylate  Anturane Depen Capsules Marnal Soma  APF Arthritis pain formula Dewitt's Pills Measurin Stanback  Argesic Dia-Gesic Meclofenamic Sulfinpyrazone  Arthritis Bayer Timed Release Aspirin Diclofenac Meclomen Sulindac  Arthritis pain formula Anacin Dicumarol Medipren Supac  Analgesic (Safety coated) Arthralgen Diffunasal Mefanamic Suprofen  Arthritis Strength Bufferin Dihydrocodeine Mepro Compound Suprol  Arthropan liquid Dopirydamole Methcarbomol with Aspirin Synalgos  ASA tablets/Enseals Disalcid Micrainin Tagament  Ascriptin Doan's Midol Talwin  Ascriptin A/D Dolene Mobidin Tanderil  Ascriptin Extra Strength Dolobid Moblgesic Ticlid  Ascriptin with Codeine Doloprin or Doloprin with Codeine Momentum Tolectin  Asperbuf Duoprin Mono-gesic Trendar  Aspergum Duradyne Motrin or Motrin IB Triminicin  Aspirin plain, buffered or enteric coated Durasal Myochrisine Trigesic  Aspirin Suppositories Easprin Nalfon Trillsate  Aspirin with Codeine Ecotrin Regular or Extra Strength Naprosyn Uracel  Atromid-S Efficin Naproxen Ursinus  Auranofin Capsules Elmiron Neocylate Vanquish  Axotal Emagrin Norgesic Verin  Azathioprine Empirin or Empirin with Codeine Normiflo Vitamin E  Azolid Emprazil Nuprin Voltaren  Bayer Aspirin plain, buffered or children's or timed BC Tablets or powders Encaprin Orgaran Warfarin Sodium  Buff-a-Comp Enoxaparin Orudis Zorpin  Buff-a-Comp with Codeine Equegesic Os-Cal-Gesic   Buffaprin Excedrin plain, buffered or Extra Strength Oxalid   Bufferin Arthritis Strength Feldene Oxphenbutazone   Bufferin plain or Extra Strength Feldene Capsules Oxycodone with Aspirin   Bufferin with Codeine Fenoprofen Fenoprofen Pabalate or Pabalate-SF   Buffets II Flogesic Panagesic   Buffinol plain or Extra Strength Florinal or Florinal with Codeine Panwarfarin   Buf-Tabs Flurbiprofen Penicillamine   Butalbital Compound Four-way cold tablets  Penicillin   Butazolidin Fragmin Pepto-Bismol   Carbenicillin Geminisyn Percodan   Carna Arthritis Reliever Geopen Persantine   Carprofen Gold's salt Persistin   Chloramphenicol Goody's Phenylbutazone   Chloromycetin Haltrain Piroxlcam   Clmetidine heparin Plaquenil   Cllnoril Hyco-pap Ponstel   Clofibrate Hydroxy chloroquine Propoxyphen         Before stopping any of these medications, be sure to consult the physician who ordered them.  Some, such as Coumadin (Warfarin) are ordered to prevent or treat serious conditions such as "deep thrombosis", "pumonary embolisms", and other heart problems.  The amount of time that you may need off of the medication may also vary  with the medication and the reason for which you were taking it.  If you are taking any of these medications, please make sure you notify your pain physician before you undergo any procedures.   Facet Blocks Patient Information  Description: The facets are joints in the spine between the vertebrae.  Like any joints in the body, facets can become irritated and painful.  Arthritis can also effect the facets.  By injecting steroids and local anesthetic in and around these joints, we can temporarily block the nerve supply to them.  Steroids act directly on irritated nerves and tissues to reduce selling and inflammation which often leads to decreased pain.  Facet blocks may be done anywhere along the spine from the neck to the low back depending upon the location of your pain.   After numbing the skin with local anesthetic (like Novocaine), a small needle is passed onto the facet joints under x-ray guidance.  You may experience a sensation of pressure while this is being done.  The entire block usually lasts about 15-25 minutes.   Conditions which may be treated by facet blocks:   Low back/buttock pain  Neck/shoulder pain  Certain types of headaches  Preparation for the injection:  1. Do not eat any solid food or dairy  products within 8 hours of your appointment. 2. You may drink clear liquid up to 3 hours before appointment.  Clear liquids include water, black coffee, juice or soda.  No milk or cream please. 3. You may take your regular medication, including pain medications, with a sip of water before your appointment.  Diabetics should hold regular insulin (if taken separately) and take 1/2 normal NPH dose the morning of the procedure.  Carry some sugar containing items with you to your appointment. 4. A driver must accompany you and be prepared to drive you home after your procedure. 5. Bring all your current medications with you. 6. An IV may be inserted and sedation may be given at the discretion of the physician. 7. A blood pressure cuff, EKG and other monitors will often be applied during the procedure.  Some patients may need to have extra oxygen administered for a short period. 8. You will be asked to provide medical information, including your allergies and medications, prior to the procedure.  We must know immediately if you are taking blood thinners (like Coumadin/Warfarin) or if you are allergic to IV iodine contrast (dye).  We must know if you could possible be pregnant.  Possible side-effects:   Bleeding from needle site  Infection (rare, may require surgery)  Nerve injury (rare)  Numbness & tingling (temporary)  Difficulty urinating (rare, temporary)  Spinal headache (a headache worse with upright posture)  Light-headedness (temporary)  Pain at injection site (serveral days)  Decreased blood pressure (rare, temporary)  Weakness in arm/leg (temporary)  Pressure sensation in back/neck (temporary)   Call if you experience:   Fever/chills associated with headache or increased back/neck pain  Headache worsened by an upright position  New onset, weakness or numbness of an extremity below the injection site  Hives or difficulty breathing (go to the emergency  room)  Inflammation or drainage at the injection site(s)  Severe back/neck pain greater than usual  New symptoms which are concerning to you  Please note:  Although the local anesthetic injected can often make your back or neck feel good for several hours after the injection, the pain will likely return. It takes 3-7 days for steroids to work.  You may not notice any pain relief for at least one week.  If effective, we will often do a series of 2-3 injections spaced 3-6 weeks apart to maximally decrease your pain.  After the initial series, you may be a candidate for a more permanent nerve block of the facets.  If you have any questions, please call #[phone] [facility]

## 2018-02-11 LAB — COMPLIANCE DRUG ANALYSIS, UR

## 2018-02-19 ENCOUNTER — Encounter: Payer: Self-pay | Admitting: Student in an Organized Health Care Education/Training Program

## 2018-02-19 ENCOUNTER — Ambulatory Visit
Admission: RE | Admit: 2018-02-19 | Discharge: 2018-02-19 | Disposition: A | Discharge status: Home / Self Care | Payer: PRIVATE HEALTH INSURANCE | Source: Ambulatory Visit | Attending: Student in an Organized Health Care Education/Training Program | Admitting: Student in an Organized Health Care Education/Training Program

## 2018-02-19 ENCOUNTER — Ambulatory Visit (HOSPITAL_BASED_OUTPATIENT_CLINIC_OR_DEPARTMENT_OTHER): Payer: PRIVATE HEALTH INSURANCE | Admitting: Student in an Organized Health Care Education/Training Program

## 2018-02-19 ENCOUNTER — Other Ambulatory Visit: Payer: Self-pay

## 2018-02-19 DIAGNOSIS — M47816 Spondylosis without myelopathy or radiculopathy, lumbar region: Secondary | ICD-10-CM | POA: Insufficient documentation

## 2018-02-19 MED ORDER — LIDOCAINE HCL 2 % IJ SOLN
INTRAMUSCULAR | Status: AC
  Filled 2018-02-19: qty 20

## 2018-02-19 MED ORDER — DEXAMETHASONE SODIUM PHOSPHATE 10 MG/ML IJ SOLN
INTRAMUSCULAR | Status: AC
  Filled 2018-02-19: qty 2

## 2018-02-19 MED ORDER — LIDOCAINE HCL 2 % IJ SOLN
20.0000 mL | Freq: Once | INTRAMUSCULAR | Status: AC
  Administered 2018-02-19: 400 mg

## 2018-02-19 MED ORDER — FENTANYL CITRATE (PF) 100 MCG/2ML IJ SOLN
INTRAMUSCULAR | Status: AC
  Filled 2018-02-19: qty 2

## 2018-02-19 MED ORDER — ROPIVACAINE HCL 2 MG/ML IJ SOLN
INTRAMUSCULAR | Status: AC
  Filled 2018-02-19: qty 10

## 2018-02-19 MED ORDER — ROPIVACAINE HCL 2 MG/ML IJ SOLN
10.0000 mL | Freq: Once | INTRAMUSCULAR | Status: AC
  Administered 2018-02-19: 20 mL

## 2018-02-19 MED ORDER — LACTATED RINGERS IV SOLN
1000.0000 mL | Freq: Once | INTRAVENOUS | Status: AC
  Administered 2018-02-19: 1000 mL via INTRAVENOUS

## 2018-02-19 MED ORDER — DEXAMETHASONE SODIUM PHOSPHATE 10 MG/ML IJ SOLN
10.0000 mg | Freq: Once | INTRAMUSCULAR | Status: AC
  Administered 2018-02-19: 10 mg

## 2018-02-19 MED ORDER — FENTANYL CITRATE (PF) 100 MCG/2ML IJ SOLN
25.0000 ug | INTRAMUSCULAR | Status: AC | PRN
  Administered 2018-02-19: 75 ug via INTRAVENOUS

## 2018-02-19 NOTE — Progress Notes (Signed)
Safety precautions to be maintained throughout the outpatient stay will include: orient to surroundings, keep bed in low position, maintain call bell within reach at all times, provide assistance with transfer out of bed and ambulation.  

## 2018-02-20 ENCOUNTER — Telehealth: Payer: Self-pay

## 2018-02-20 ENCOUNTER — Telehealth: Payer: Self-pay | Admitting: Student in an Organized Health Care Education/Training Program

## 2018-02-20 ENCOUNTER — Telehealth: Payer: Self-pay | Admitting: *Deleted

## 2018-02-20 MED ORDER — DICLOFENAC SODIUM 75 MG PO TBEC: 75.0000 mg | DELAYED_RELEASE_TABLET | Freq: Two times a day (BID) | ORAL | 1 refills | Status: DC

## 2018-02-20 NOTE — Progress Notes (Signed)
Patient's Name: Michele [name]  MRN: [id number]  Referring Provider: Edward JollyLateef, Kamea Dacosta, MD  DOB: [birth year]  PCP: Rafael BihariWalker, John B III, MD  DOS: 02/19/2018  Note by: Edward JollyBilal Gwendy Boeder, MD  Service setting: Ambulatory outpatient  Specialty: Interventional Pain Management  Patient type: Established  Location: [facility] (AMB) Pain Management Facility  Visit type: Interventional Procedure   Primary Reason for Visit: Interventional Pain Management Treatment. CC: Back Pain (low)  Procedure:          Anesthesia, Analgesia, Anxiolysis:  Type: Lumbar Facet, Medial Branch Block(s)          Primary Purpose: Diagnostic Region: Posterolateral Lumbosacral Spine Level: L3, L4, L5, & S1 Medial Branch Level(s). Injecting these levels blocks the L3-4, L4-5, and L5-S1 lumbar facet joints. Laterality: Bilateral  Type: Moderate (Conscious) Sedation combined with Local Anesthesia Indication(s): Analgesia and Anxiety Route: Intravenous (IV) IV Access: Secured Sedation: Meaningful verbal contact was maintained at all times during the procedure  Local Anesthetic: Lidocaine 1-2%  Position: Prone   Indications: 1. Lumbar facet joint syndrome    Pain Score: Pre-procedure: 9 /10 Post-procedure: 0-No pain/10  Pre-op Assessment:  Ms. Michele [name] is a 52 y.o. (year old), female patient, seen today for interventional treatment. She  has a past surgical history that includes Cesarean section; Knee surgery; Ankle surgery; Abdominal hysterectomy; and Wrist surgery. Ms. Michele [name] has a current medication list which includes the following prescription(s): albuterol, amitriptyline, glipizide, ibuprofen, lidocaine, metformin, pregabalin, sumatriptan-naproxen, topiramate, triamterene-hydrochlorothiazide, and verapamil. Her primarily concern today is the Back Pain (low)  Initial Vital Signs:  Pulse/HCG Rate: 88ECG Heart Rate: 76 Temp: 98.9 F (37.2 C) Resp: 18 BP: (!) 130/98 SpO2: 100 %  BMI: Estimated body mass index is 40.35 kg/m as  calculated from the following:   Height as of this encounter: 5\' 6"  (1.676 m).   Weight as of this encounter: 250 lb (113.4 kg).  Risk Assessment: Allergies: Reviewed. She has No Known Allergies.  Allergy Precautions: None required Coagulopathies: Reviewed. None identified.  Blood-thinner therapy: None at this time Active Infection(s): Reviewed. None identified. Ms. Michele [name] is afebrile  Site Confirmation: Ms. Michele [name] was asked to confirm the procedure and laterality before marking the site Procedure checklist: Completed Consent: Before the procedure and under the influence of no sedative(s), amnesic(s), or anxiolytics, the patient was informed of the treatment options, risks and possible complications. To fulfill our ethical and legal obligations, as recommended by the American Medical Association's Code of Ethics, I have informed the patient of my clinical impression; the nature and purpose of the treatment or procedure; the risks, benefits, and possible complications of the intervention; the alternatives, including doing nothing; the risk(s) and benefit(s) of the alternative treatment(s) or procedure(s); and the risk(s) and benefit(s) of doing nothing. The patient was provided information about the general risks and possible complications associated with the procedure. These may include, but are not limited to: failure to achieve desired goals, infection, bleeding, organ or nerve damage, allergic reactions, paralysis, and death. In addition, the patient was informed of those risks and complications associated to Spine-related procedures, such as failure to decrease pain; infection (i.e.: Meningitis, epidural or intraspinal abscess); bleeding (i.e.: epidural hematoma, subarachnoid hemorrhage, or any other type of intraspinal or peri-dural bleeding); organ or nerve damage (i.e.: Any type of peripheral nerve, nerve root, or spinal cord injury) with subsequent damage to sensory, motor, and/or autonomic  systems, resulting in permanent pain, numbness, and/or weakness of one or several areas of the body; allergic reactions; (i.e.: anaphylactic  reaction); and/or death. Furthermore, the patient was informed of those risks and complications associated with the medications. These include, but are not limited to: allergic reactions (i.e.: anaphylactic or anaphylactoid reaction(s)); adrenal axis suppression; blood sugar elevation that in diabetics may result in ketoacidosis or comma; water retention that in patients with history of congestive heart failure may result in shortness of breath, pulmonary edema, and decompensation with resultant heart failure; weight gain; swelling or edema; medication-induced neural toxicity; particulate matter embolism and blood vessel occlusion with resultant organ, and/or nervous system infarction; and/or aseptic necrosis of one or more joints. Finally, the patient was informed that Medicine is not an exact science; therefore, there is also the possibility of unforeseen or unpredictable risks and/or possible complications that may result in a catastrophic outcome. The patient indicated having understood very clearly. We have given the patient no guarantees and we have made no promises. Enough time was given to the patient to ask questions, all of which were answered to the patient's satisfaction. Ms. Niziolek has indicated that she wanted to continue with the procedure. Attestation: I, the ordering provider, attest that I have discussed with the patient the benefits, risks, side-effects, alternatives, likelihood of achieving goals, and potential problems during recovery for the procedure that I have provided informed consent. Date  Time: 02/19/2018 12:58 PM  Pre-Procedure Preparation:  Monitoring: As per clinic protocol. Respiration, ETCO2, SpO2, BP, heart rate and rhythm monitor placed and checked for adequate function Safety Precautions: Patient was assessed for positional comfort  and pressure points before starting the procedure. Time-out: I initiated and conducted the "Time-out" before starting the procedure, as per protocol. The patient was asked to participate by confirming the accuracy of the "Time Out" information. Verification of the correct person, site, and procedure were performed and confirmed by me, the nursing staff, and the patient. "Time-out" conducted as per Joint Commission's Universal Protocol (UP.01.01.01). Time: 1412  Description of Procedure:          Laterality: Bilateral. The procedure was performed in identical fashion on both sides. Levels:   L3, L4, L5, & S1 Medial Branch Level(s) Area Prepped: Posterior Lumbosacral Region Prepping solution: ChloraPrep (2% chlorhexidine gluconate and 70% isopropyl alcohol) Safety Precautions: Aspiration looking for blood return was conducted prior to all injections. At no point did we inject any substances, as a needle was being advanced. Before injecting, the patient was told to immediately notify me if she was experiencing any new onset of "ringing in the ears, or metallic taste in the mouth". No attempts were made at seeking any paresthesias. Safe injection practices and needle disposal techniques used. Medications properly checked for expiration dates. SDV (single dose vial) medications used. After the completion of the procedure, all disposable equipment used was discarded in the proper designated medical waste containers. Local Anesthesia: Protocol guidelines were followed. The patient was positioned over the fluoroscopy table. The area was prepped in the usual manner. The time-out was completed. The target area was identified using fluoroscopy. A 12-in long, straight, sterile hemostat was used with fluoroscopic guidance to locate the targets for each level blocked. Once located, the skin was marked with an approved surgical skin marker. Once all sites were marked, the skin (epidermis, dermis, and hypodermis), as well  as deeper tissues (fat, connective tissue and muscle) were infiltrated with a small amount of a short-acting local anesthetic, loaded on a 10cc syringe with a 25G, 1.5-in  Needle. An appropriate amount of time was allowed for local anesthetics to take  effect before proceeding to the next step. Local Anesthetic: Lidocaine 2.0% The unused portion of the local anesthetic was discarded in the proper designated containers. Technical explanation of process:   L3 Medial Branch Nerve Block (MBB): The target area for the L3 medial branch is at the junction of the postero-lateral aspect of the superior articular process and the superior, posterior, and medial edge of the transverse process of L4. Under fluoroscopic guidance, a Quincke needle was inserted until contact was made with os over the superior postero-lateral aspect of the pedicular shadow (target area). After negative aspiration for blood, 1 mL of the nerve block solution was injected without difficulty or complication. The needle was removed intact. L4 Medial Branch Nerve Block (MBB): The target area for the L4 medial branch is at the junction of the postero-lateral aspect of the superior articular process and the superior, posterior, and medial edge of the transverse process of L5. Under fluoroscopic guidance, a Quincke needle was inserted until contact was made with os over the superior postero-lateral aspect of the pedicular shadow (target area). After negative aspiration for blood, 1mL of the nerve block solution was injected without difficulty or complication. The needle was removed intact. L5 Medial Branch Nerve Block (MBB): The target area for the L5 medial branch is at the junction of the postero-lateral aspect of the superior articular process and the superior, posterior, and medial edge of the sacral ala. Under fluoroscopic guidance, a Quincke needle was inserted until contact was made with os over the superior postero-lateral aspect of the  pedicular shadow (target area). After negative aspiration for blood, 1mL of the nerve block solution was injected without difficulty or complication. The needle was removed intact. S1 Medial Branch Nerve Block (MBB): The target area for the S1 medial branch is at the posterior and inferior 6 o'clock position of the L5-S1 facet joint. Under fluoroscopic guidance, the Quincke needle inserted for the L5 MBB was redirected until contact was made with os over the inferior and postero aspect of the sacrum, at the 6 o' clock position under the L5-S1 facet joint (Target area). After negative aspiration for blood, 1mL of the nerve block solution was injected without difficulty or complication. The needle was removed intact. Procedural Needles: 22-gauge, 3.5-inch, Quincke needles used for all levels. Nerve block solution: 10 cc solution: 9 cc of 0.2% Ropivacaine, 1 cc of Decadron 10 mg/cc. 1 cc injected at each level above bilaterally. The unused portion of the solution was discarded in the proper designated containers.  Once the entire procedure was completed, the treated area was cleaned, making sure to leave some of the prepping solution back to take advantage of its long term bactericidal properties.   Illustration of the posterior view of the lumbar spine and the posterior neural structures. Laminae of L2 through S1 are labeled. DPRL5, dorsal primary ramus of L5; DPRS1, dorsal primary ramus of S1; DPR3, dorsal primary ramus of L3; FJ, facet (zygapophyseal) joint L3-L4; I, inferior articular process of L4; LB1, lateral branch of dorsal primary ramus of L1; IAB, inferior articular branches from L3 medial branch (supplies L4-L5 facet joint); IBP, intermediate branch plexus; MB3, medial branch of dorsal primary ramus of L3; NR3, third lumbar nerve root; S, superior articular process of L5; SAB, superior articular branches from L4 (supplies L4-5 facet joint also); TP3, transverse process of L3.  Vitals:   02/19/18  1432 02/19/18 1442 02/19/18 1452 02/19/18 1502  BP: 123/80 120/80 114/70 120/83  Pulse: 79  Resp: 13 15 15 18   Temp:  (!) 97.5 F (36.4 C)  (!) 97 F (36.1 C)  TempSrc:      SpO2: 98% 100% 100% 100%  Weight:      Height:         Start Time: 1412 hrs. End Time: 1431 hrs.  Imaging Guidance (Spinal):          Type of Imaging Technique: Fluoroscopy Guidance (Spinal) Indication(s): Assistance in needle guidance and placement for procedures requiring needle placement in or near specific anatomical locations not easily accessible without such assistance. Exposure Time: Please see nurses notes. Contrast: None used. Fluoroscopic Guidance: I was personally present during the use of fluoroscopy. "Tunnel Vision Technique" used to obtain the best possible view of the target area. Parallax error corrected before commencing the procedure. "Direction-depth-direction" technique used to introduce the needle under continuous pulsed fluoroscopy. Once target was reached, antero-posterior, oblique, and lateral fluoroscopic projection used confirm needle placement in all planes. Images permanently stored in EMR. Interpretation: No contrast injected. I personally interpreted the imaging intraoperatively. Adequate needle placement confirmed in multiple planes. Permanent images saved into the patient's record.  Antibiotic Prophylaxis:   Anti-infectives (From admission, onward)   None     Indication(s): None identified  Post-operative Assessment:  Post-procedure Vital Signs:  Pulse/HCG Rate: 7977 Temp: (!) 97 F (36.1 C) Resp: 18 BP: 120/83 SpO2: 100 %  EBL: None  Complications: No immediate post-treatment complications observed by team, or reported by patient.  Note: The patient tolerated the entire procedure well. A repeat set of vitals were taken after the procedure and the patient was kept under observation following institutional policy, for this type of procedure. Post-procedural  neurological assessment was performed, showing return to baseline, prior to discharge. The patient was provided with post-procedure discharge instructions, including a section on how to identify potential problems. Should any problems arise concerning this procedure, the patient was given instructions to immediately contact us, at any time, without hesitation. In any case, we plan to contact the patient by telephone for a follow-up status report regarding this interventional procedure.  Comments:  No additional relevant information. 5 out of 5 strength bilateral lower extremity: Plantar flexion, dorsiflexion, knee flexion, knee extension.  Plan of Care   Imaging Orders     DG C-Arm 1-60 Min-No Report Procedure Orders    No procedure(s) ordered today    Medications ordered for procedure: Meds ordered this encounter  Medications  . lactated ringers infusion 1,000 mL  . fentaNYL (SUBLIMAZE) injection 25-100 mcg    Make sure Narcan is available in the pyxis when using this medication. In the event of respiratory depression (RR< 8/min): Titrate NARCAN (naloxone) in increments of 0.1 to 0.2 mg IV at 2-3 minute intervals, until desired degree of reversal.  . ropivacaine (PF) 2 mg/mL (0.2%) (NAROPIN) injection 10 mL  . lidocaine (XYLOCAINE) 2 % (with pres) injection 400 mg  . dexamethasone (DECADRON) injection 10 mg  . dexamethasone (DECADRON) injection 10 mg   Medications administered: We administered lactated ringers, fentaNYL, ropivacaine (PF) 2 mg/mL (0.2%), lidocaine, dexamethasone, and dexamethasone.  See the medical record for exact dosing, route, and time of administration.  Disposition: Discharge home  Discharge Date & Time: 02/19/2018; 1507 hrs.     Future Appointments  Date Time Provider Department Center  03/20/2018 12:00 PM Edward JollyLateef, Ravenna Legore, [facility]   Primary Care Physician: Rafael BihariWalker, John B III, MD [facility] Note by: Edward JollyBilal  Noely Kuhnle,  MD Date: 02/19/2018; Time: 8:08 AM  Disclaimer:  Medicine is not an Visual merchandiser. The only guarantee in medicine is that nothing is guaranteed. It is important to note that the decision to proceed with this intervention was based on the information collected from the patient. The Data and conclusions were drawn from the patient's questionnaire, the interview, and the physical examination. Because the information was provided in large part by the patient, it cannot be guaranteed that it has not been purposely or unconsciously manipulated. Every effort has been made to obtain as much relevant data as possible for this evaluation. It is important to note that the conclusions that lead to this procedure are derived in large part from the available data. Always take into account that the treatment will also be dependent on availability of resources and existing treatment guidelines, considered by other Pain Management Practitioners as being common knowledge and practice, at the time of the intervention. For Medico-Legal purposes, it is also important to point out that variation in procedural techniques and pharmacological choices are the acceptable norm. The indications, contraindications, technique, and results of the above procedure should only be interpreted and judged by a Board-Certified Interventional Pain Specialist with extensive familiarity and expertise in the same exact procedure and technique.

## 2018-02-20 NOTE — Telephone Encounter (Signed)
Patient notified that her medication has been sent in.

## 2018-02-20 NOTE — Telephone Encounter (Signed)
Patient called asking if she could get any medications called in as she has been up since about 2:30 with a lot of pain after the numbing wore off.

## 2018-02-20 NOTE — Telephone Encounter (Signed)
error 

## 2018-03-20 ENCOUNTER — Ambulatory Visit
Payer: PRIVATE HEALTH INSURANCE | Attending: Student in an Organized Health Care Education/Training Program | Admitting: Student in an Organized Health Care Education/Training Program

## 2018-03-20 ENCOUNTER — Other Ambulatory Visit: Payer: Self-pay

## 2018-03-20 ENCOUNTER — Encounter: Payer: Self-pay | Admitting: Student in an Organized Health Care Education/Training Program

## 2018-03-20 VITALS — BP 121/79 | HR 69 | Temp 97.7°F | Resp 18 | Ht 66.0 in | Wt 260.0 lb

## 2018-03-20 DIAGNOSIS — G8929 Other chronic pain: Secondary | ICD-10-CM | POA: Insufficient documentation

## 2018-03-20 DIAGNOSIS — M545 Low back pain: Secondary | ICD-10-CM | POA: Insufficient documentation

## 2018-03-20 DIAGNOSIS — G894 Chronic pain syndrome: Secondary | ICD-10-CM | POA: Diagnosis present

## 2018-03-20 DIAGNOSIS — M47816 Spondylosis without myelopathy or radiculopathy, lumbar region: Secondary | ICD-10-CM | POA: Insufficient documentation

## 2018-03-20 MED ORDER — PREGABALIN 100 MG PO CAPS: 100.0000 mg | ORAL_CAPSULE | Freq: Three times a day (TID) | ORAL | 0 refills | Status: DC

## 2018-03-20 MED ORDER — AMITRIPTYLINE HCL 75 MG PO TABS: 75.0000 mg | ORAL_TABLET | Freq: Every day | ORAL | 3 refills | Status: DC

## 2018-03-20 NOTE — Progress Notes (Signed)
Safety precautions to be maintained throughout the outpatient stay will include: orient to surroundings, keep bed in low position, maintain call bell within reach at all times, provide assistance with transfer out of bed and ambulation.  

## 2018-03-20 NOTE — Patient Instructions (Signed)
GENERAL RISKS AND COMPLICATIONS  What are the risk, side effects and possible complications? Generally speaking, most procedures are safe.  However, with any procedure there are risks, side effects, and the possibility of complications.  The risks and complications are dependent upon the sites that are lesioned, or the type of nerve block to be performed.  The closer the procedure is to the spine, the more serious the risks are.  Great care is taken when placing the radio frequency needles, block needles or lesioning probes, but sometimes complications can occur. 1. Infection: Any time there is an injection through the skin, there is a risk of infection.  This is why sterile conditions are used for these blocks.  There are four possible types of infection. 1. Localized skin infection. 2. Central Nervous System Infection-This can be in the form of Meningitis, which can be deadly. 3. Epidural Infections-This can be in the form of an epidural abscess, which can cause pressure inside of the spine, causing compression of the spinal cord with subsequent paralysis. This would require an emergency surgery to decompress, and there are no guarantees that the patient would recover from the paralysis. 4. Discitis-This is an infection of the intervertebral discs.  It occurs in about 1% of discography procedures.  It is difficult to treat and it may lead to surgery.        2. Pain: the needles have to go through skin and soft tissues, will cause soreness.       3. Damage to internal structures:  The nerves to be lesioned may be near blood vessels or    other nerves which can be potentially damaged.       4. Bleeding: Bleeding is more common if the patient is taking blood thinners such as  aspirin, Coumadin, Ticiid, Plavix, etc., or if he/she have some genetic predisposition  such as hemophilia. Bleeding into the spinal canal can cause compression of the spinal  cord with subsequent paralysis.  This would require an  emergency surgery to  decompress and there are no guarantees that the patient would recover from the  paralysis.       5. Pneumothorax:  Puncturing of a lung is a possibility, every time a needle is introduced in  the area of the chest or upper back.  Pneumothorax refers to free air around the  collapsed lung(s), inside of the thoracic cavity (chest cavity).  Another two possible  complications related to a similar event would include: Hemothorax and Chylothorax.   These are variations of the Pneumothorax, where instead of air around the collapsed  lung(s), you may have blood or chyle, respectively.       6. Spinal headaches: They may occur with any procedures in the area of the spine.       7. Persistent CSF (Cerebro-Spinal Fluid) leakage: This is a rare problem, but may occur  with prolonged intrathecal or epidural catheters either due to the formation of a fistulous  track or a dural tear.       8. Nerve damage: By working so close to the spinal cord, there is always a possibility of  nerve damage, which could be as serious as a permanent spinal cord injury with  paralysis.       9. Death:  Although rare, severe deadly allergic reactions known as "Anaphylactic  reaction" can occur to any of the medications used.      10. Worsening of the symptoms:  We can always make thing worse.    What are the chances of something like this happening? Chances of any of this occuring are extremely low.  By statistics, you have more of a chance of getting killed in a motor vehicle accident: while driving to the hospital than any of the above occurring .  Nevertheless, you should be aware that they are possibilities.  In general, it is similar to taking a shower.  Everybody knows that you can slip, hit your head and get killed.  Does that mean that you should not shower again?  Nevertheless always keep in mind that statistics do not mean anything if you happen to be on the wrong side of them.  Even if a procedure has a 1  (one) in a 1,000,000 (million) chance of going wrong, it you happen to be that one..Also, keep in mind that by statistics, you have more of a chance of having something go wrong when taking medications.  Who should not have this procedure? If you are on a blood thinning medication (e.g. Coumadin, Plavix, see list of "Blood Thinners"), or if you have an active infection going on, you should not have the procedure.  If you are taking any blood thinners, please inform your physician.  How should I prepare for this procedure?  Do not eat or drink anything at least six hours prior to the procedure.  Bring a driver with you .  It cannot be a taxi.  Come accompanied by an adult that can drive you back, and that is strong enough to help you if your legs get weak or numb from the local anesthetic.  Take all of your medicines the morning of the procedure with just enough water to swallow them.  If you have diabetes, make sure that you are scheduled to have your procedure done first thing in the morning, whenever possible.  If you have diabetes, take only half of your insulin dose and notify our nurse that you have done so as soon as you arrive at the clinic.  If you are diabetic, but only take blood sugar pills (oral hypoglycemic), then do not take them on the morning of your procedure.  You may take them after you have had the procedure.  Do not take aspirin or any aspirin-containing medications, at least eleven (11) days prior to the procedure.  They may prolong bleeding.  Wear loose fitting clothing that may be easy to take off and that you would not mind if it got stained with Betadine or blood.  Do not wear any jewelry or perfume  Remove any nail coloring.  It will interfere with some of our monitoring equipment.  NOTE: Remember that this is not meant to be interpreted as a complete list of all possible complications.  Unforeseen problems may occur.  BLOOD THINNERS The following drugs  contain aspirin or other products, which can cause increased bleeding during surgery and should not be taken for 2 weeks prior to and 1 week after surgery.  If you should need take something for relief of minor pain, you may take acetaminophen which is found in Tylenol,m Datril, Anacin-3 and Panadol. It is not blood thinner. The products listed below are.  Do not take any of the products listed below in addition to any listed on your instruction sheet.  A.P.C or A.P.C with Codeine Codeine Phosphate Capsules #3 Ibuprofen Ridaura  ABC compound Congesprin Imuran rimadil  Advil Cope Indocin Robaxisal  Alka-Seltzer Effervescent Pain Reliever and Antacid Coricidin or Coricidin-D  Indomethacin Rufen    Alka-Seltzer plus Cold Medicine Cosprin Ketoprofen S-A-C Tablets  Anacin Analgesic Tablets or Capsules Coumadin Korlgesic Salflex  Anacin Extra Strength Analgesic tablets or capsules CP-2 Tablets Lanoril Salicylate  Anaprox Cuprimine Capsules Levenox Salocol  Anexsia-D Dalteparin Magan Salsalate  Anodynos Darvon compound Magnesium Salicylate Sine-off  Ansaid Dasin Capsules Magsal Sodium Salicylate  Anturane Depen Capsules Marnal Soma  APF Arthritis pain formula Dewitt's Pills Measurin Stanback  Argesic Dia-Gesic Meclofenamic Sulfinpyrazone  Arthritis Bayer Timed Release Aspirin Diclofenac Meclomen Sulindac  Arthritis pain formula Anacin Dicumarol Medipren Supac  Analgesic (Safety coated) Arthralgen Diffunasal Mefanamic Suprofen  Arthritis Strength Bufferin Dihydrocodeine Mepro Compound Suprol  Arthropan liquid Dopirydamole Methcarbomol with Aspirin Synalgos  ASA tablets/Enseals Disalcid Micrainin Tagament  Ascriptin Doan's Midol Talwin  Ascriptin A/D Dolene Mobidin Tanderil  Ascriptin Extra Strength Dolobid Moblgesic Ticlid  Ascriptin with Codeine Doloprin or Doloprin with Codeine Momentum Tolectin  Asperbuf Duoprin Mono-gesic Trendar  Aspergum Duradyne Motrin or Motrin IB Triminicin  Aspirin  plain, buffered or enteric coated Durasal Myochrisine Trigesic  Aspirin Suppositories Easprin Nalfon Trillsate  Aspirin with Codeine Ecotrin Regular or Extra Strength Naprosyn Uracel  Atromid-S Efficin Naproxen Ursinus  Auranofin Capsules Elmiron Neocylate Vanquish  Axotal Emagrin Norgesic Verin  Azathioprine Empirin or Empirin with Codeine Normiflo Vitamin E  Azolid Emprazil Nuprin Voltaren  Bayer Aspirin plain, buffered or children's or timed BC Tablets or powders Encaprin Orgaran Warfarin Sodium  Buff-a-Comp Enoxaparin Orudis Zorpin  Buff-a-Comp with Codeine Equegesic Os-Cal-Gesic   Buffaprin Excedrin plain, buffered or Extra Strength Oxalid   Bufferin Arthritis Strength Feldene Oxphenbutazone   Bufferin plain or Extra Strength Feldene Capsules Oxycodone with Aspirin   Bufferin with Codeine Fenoprofen Fenoprofen Pabalate or Pabalate-SF   Buffets II Flogesic Panagesic   Buffinol plain or Extra Strength Florinal or Florinal with Codeine Panwarfarin   Buf-Tabs Flurbiprofen Penicillamine   Butalbital Compound Four-way cold tablets Penicillin   Butazolidin Fragmin Pepto-Bismol   Carbenicillin Geminisyn Percodan   Carna Arthritis Reliever Geopen Persantine   Carprofen Gold's salt Persistin   Chloramphenicol Goody's Phenylbutazone   Chloromycetin Haltrain Piroxlcam   Clmetidine heparin Plaquenil   Cllnoril Hyco-pap Ponstel   Clofibrate Hydroxy chloroquine Propoxyphen         Before stopping any of these medications, be sure to consult the physician who ordered them.  Some, such as Coumadin (Warfarin) are ordered to prevent or treat serious conditions such as "deep thrombosis", "pumonary embolisms", and other heart problems.  The amount of time that you may need off of the medication may also vary with the medication and the reason for which you were taking it.  If you are taking any of these medications, please make sure you notify your pain physician before you undergo any  procedures.   Moderate Conscious Sedation, Adult Sedation is the use of medicines to promote relaxation and relieve discomfort and anxiety. Moderate conscious sedation is a type of sedation. Under moderate conscious sedation, you are less alert than normal, but you are still able to respond to instructions, touch, or both. Moderate conscious sedation is used during short medical and dental procedures. It is milder than deep sedation, which is a type of sedation under which you cannot be easily woken up. It is also milder than general anesthesia, which is the use of medicines to make you unconscious. Moderate conscious sedation allows you to return to your regular activities sooner. Tell a health care provider about:  Any allergies you have.  All medicines you are taking, including vitamins, herbs, eye drops,  creams, and over-the-counter medicines.  Use of steroids (by mouth or creams).  Any problems you or family members have had with sedatives and anesthetic medicines.  Any blood disorders you have.  Any surgeries you have had.  Any medical conditions you have, such as sleep apnea.  Whether you are pregnant or may be pregnant.  Any use of cigarettes, alcohol, marijuana, or street drugs. What are the risks? Generally, this is a safe procedure. However, problems may occur, including:  Getting too much medicine (oversedation).  Nausea.  Allergic reaction to medicines.  Trouble breathing. If this happens, a breathing tube may be used to help with breathing. It will be removed when you are awake and breathing on your own.  Heart trouble.  Lung trouble. What happens before the procedure? Staying hydrated Follow instructions from your health care provider about hydration, which may include:  Up to 2 hours before the procedure - you may continue to drink clear liquids, such as water, clear fruit juice, black coffee, and plain tea. Eating and drinking restrictions Follow  instructions from your health care provider about eating and drinking, which may include:  8 hours before the procedure - stop eating heavy meals or foods such as meat, fried foods, or fatty foods.  6 hours before the procedure - stop eating light meals or foods, such as toast or cereal.  6 hours before the procedure - stop drinking milk or drinks that contain milk.  2 hours before the procedure - stop drinking clear liquids. Medicine Ask your health care provider about:  Changing or stopping your regular medicines. This is especially important if you are taking diabetes medicines or blood thinners.  Taking medicines such as aspirin and ibuprofen. These medicines can thin your blood. Do not take these medicines before your procedure if your health care provider instructs you not to.  Tests and exams  You will have a physical exam.  You may have blood tests done to show: ? How well your kidneys and liver are working. ? How well your blood can clot. General instructions  Plan to have someone take you home from the hospital or clinic.  If you will be going home right after the procedure, plan to have someone with you for 24 hours. What happens during the procedure?  An IV tube will be inserted into one of your veins.  Medicine to help you relax (sedative) will be given through the IV tube.  The medical or dental procedure will be performed. What happens after the procedure?  Your blood pressure, heart rate, breathing rate, and blood oxygen level will be monitored often until the medicines you were given have worn off.  Do not drive for 24 hours. This information is not intended to replace advice given to you by your health care provider. Make sure you discuss any questions you have with your health care provider. Document Released: 11/14/2000 Document Revised: 07/26/2015 Document Reviewed: 06/11/2015 [employer]vier Interactive Patient Education  2019 [employer]  Facet  Blocks Patient Information  Description: The facets are joints in the spine between the vertebrae.  Like any joints in the body, facets can become irritated and painful.  Arthritis can also effect the facets.  By injecting steroids and local anesthetic in and around these joints, we can temporarily block the nerve supply to them.  Steroids act directly on irritated nerves and tissues to reduce selling and inflammation which often leads to decreased pain.  Facet blocks may be done anywhere along the  spine from the neck to the low back depending upon the location of your pain.   After numbing the skin with local anesthetic (like Novocaine), a small needle is passed onto the facet joints under x-ray guidance.  You may experience a sensation of pressure while this is being done.  The entire block usually lasts about 15-25 minutes.   Conditions which may be treated by facet blocks:   Low back/buttock pain  Neck/shoulder pain  Certain types of headaches  Preparation for the injection:  1. Do not eat any solid food or dairy products within 8 hours of your appointment. 2. You may drink clear liquid up to 3 hours before appointment.  Clear liquids include water, black coffee, juice or soda.  No milk or cream please. 3. You may take your regular medication, including pain medications, with a sip of water before your appointment.  Diabetics should hold regular insulin (if taken separately) and take 1/2 normal NPH dose the morning of the procedure.  Carry some sugar containing items with you to your appointment. 4. A driver must accompany you and be prepared to drive you home after your procedure. 5. Bring all your current medications with you. 6. An IV may be inserted and sedation may be given at the discretion of the physician. 7. A blood pressure cuff, EKG and other monitors will often be applied during the procedure.  Some patients may need to have extra oxygen administered for a short period. 8. You  will be asked to provide medical information, including your allergies and medications, prior to the procedure.  We must know immediately if you are taking blood thinners (like Coumadin/Warfarin) or if you are allergic to IV iodine contrast (dye).  We must know if you could possible be pregnant.  Possible side-effects:   Bleeding from needle site  Infection (rare, may require surgery)  Nerve injury (rare)  Numbness & tingling (temporary)  Difficulty urinating (rare, temporary)  Spinal headache (a headache worse with upright posture)  Light-headedness (temporary)  Pain at injection site (serveral days)  Decreased blood pressure (rare, temporary)  Weakness in arm/leg (temporary)  Pressure sensation in back/neck (temporary)   Call if you experience:   Fever/chills associated with headache or increased back/neck pain  Headache worsened by an upright position  New onset, weakness or numbness of an extremity below the injection site  Hives or difficulty breathing (go to the emergency room)  Inflammation or drainage at the injection site(s)  Severe back/neck pain greater than usual  New symptoms which are concerning to you  Please note:  Although the local anesthetic injected can often make your back or neck feel good for several hours after the injection, the pain will likely return. It takes 3-7 days for steroids to work.  You may not notice any pain relief for at least one week.  If effective, we will often do a series of 2-3 injections spaced 3-6 weeks apart to maximally decrease your pain.  After the initial series, you may be a candidate for a more permanent nerve block of the facets.  If you have any questions, please call #[phone] [facility]

## 2018-03-20 NOTE — Progress Notes (Signed)
Michele [name]'s Name: Michele [name]  MRN: [id number]  Referring Provider: Madelyn Brunner, MD  DOB: 1965-07-02  PCP: Madelyn Brunner, MD  DOS: 03/20/2018  Note by: Gillis Santa, MD  Service setting: Ambulatory outpatient  Specialty: Interventional Pain Management  Location: [facility] (AMB) Pain Management Facility    Michele [name] type: Established   Primary Reason(s) for Visit: Encounter for post-procedure evaluation of chronic illness with mild to moderate exacerbation CC: Back Pain (lower)  HPI  Michele [name] is a 53 y.o. year old, female Michele [name], who comes today for a post-procedure evaluation. Michele [name] has Neck pain; Migraines; Obesity, morbid, BMI 40.0-49.9 ([place]); Diabetes mellitus type 2, uncomplicated ([place]); Bilateral occipital neuralgia; Lumbar facet joint syndrome; Chronic bilateral low back pain without sciatica; and Chronic pain syndrome on their problem list. Her primarily concern today is the Back Pain (lower)  Pain Assessment: Location: Lower, Left Back Radiating: to right thigh Onset: More than a month ago Duration: Chronic pain Quality: Aching, Constant Severity: 10-Worst pain ever/10 (subjective, self-reported pain score)  Note: Reported level is inconsistent with clinical observations. Clinically the Michele [name] looks like a 2/10 A 2/10 is viewed as "Mild to Moderate" and described as noticeable and distracting. Impossible to hide from other people. More frequent flare-ups. Still possible to adapt and function close to normal. It can be very annoying and may have occasional stronger flare-ups. With discipline, patients may get used to it and adapt. Information on the proper use of the pain scale provided to the Michele [name] today. When using our objective Pain Scale, levels between 6 and 10/10 are said to belong in an emergency room, as it progressively worsens from a 6/10, described as severely limiting, requiring emergency care not usually available at an outpatient pain management facility. At a 6/10  level, communication becomes difficult and requires great effort. Assistance to reach the emergency department may be required. Facial flushing and profuse sweating along with potentially dangerous increases in heart rate and blood pressure will be evident. Effect on ADL: hurts to bend, lift, sleep and work Timing: Constant Modifying factors: heating pad BP: 121/79  HR: 69  Michele [name] comes in today for post-procedure evaluation.  Further details on both, my assessment(s), as well as the proposed treatment plan, please see below.  Michele [name] follows up status post lumbar facet medial branch nerve block.  Michele [name] states that the block is helped out with her pain however Michele [name] continues to have pain at her work when Michele [name] is lifting heavy objects.  Michele [name] states that the diclofenac that Michele [name] was taking twice a day was not very helpful.  Post-Procedure Assessment  02/20/2018 Procedure: Bilateral L3, L4, L5, S1 facet medial branch nerve block Pre-procedure pain score:  9/10 Post-procedure pain score: 0/10         Influential Factors: BMI: 41.97 kg/m Intra-procedural challenges: None observed.         Assessment challenges: None detected.              Reported side-effects: None.        Post-procedural adverse reactions or complications: None reported         Sedation: Please see nurses note. When no sedatives are used, the analgesic levels obtained are directly associated to the effectiveness of the local anesthetics. However, when sedation is provided, the level of analgesia obtained during the initial 1 hour following the intervention, is believed to be the result of a combination of factors. These factors may include, but are not limited to:  1. The effectiveness of the local anesthetics used. 2. The effects of the analgesic(s) and/or anxiolytic(s) used. 3. The degree of discomfort experienced by the Michele [name] at the time of the procedure. 4. The patients ability and reliability in recalling and recording  the events. 5. The presence and influence of possible secondary gains and/or psychosocial factors. Reported result: Relief experienced during the 1st hour after the procedure: 100 % (Ultra-Short Term Relief)            Interpretative annotation: Clinically appropriate result. Analgesia during this period is likely to be Local Anesthetic and/or IV Sedative (Analgesic/Anxiolytic) related.          Effects of local anesthetic: The analgesic effects attained during this period are directly associated to the localized infiltration of local anesthetics and therefore cary significant diagnostic value as to the etiological location, or anatomical origin, of the pain. Expected duration of relief is directly dependent on the pharmacodynamics of the local anesthetic used. Long-acting (4-6 hours) anesthetics used.  Reported result: Relief during the next 4 to 6 hour after the procedure: 100 % (Short-Term Relief)            Interpretative annotation: Clinically appropriate result. Analgesia during this period is likely to be Local Anesthetic-related.          Long-term benefit: Defined as the period of time past the expected duration of local anesthetics (1 hour for short-acting and 4-6 hours for long-acting). With the possible exception of prolonged sympathetic blockade from the local anesthetics, benefits during this period are typically attributed to, or associated with, other factors such as analgesic sensory neuropraxia, antiinflammatory effects, or beneficial biochemical changes provided by agents other than the local anesthetics.  Reported result: Extended relief following procedure: 100 % (Long-Term Relief)            Interpretative annotation: Clinically possible results. Good relief. No permanent benefit expected. Inflammation plays a part in the etiology to the pain.          Current benefits: Defined as reported results that persistent at this point in time.   Analgesia: 50-75 %            Function:  Somewhat improved ROM: Somewhat improved Interpretative annotation: Recurrence of symptoms. No permanent benefit expected. Effective diagnostic intervention.          Interpretation: Results would suggest a successful diagnostic intervention.                  Plan:  Please see "Plan of Care" for details.                Laboratory Chemistry  Inflammation Markers (CRP: Acute Phase) (ESR: Chronic Phase) No results found for: CRP, ESRSEDRATE, LATICACIDVEN                       Rheumatology Markers No results found for: RF, ANA, LABURIC, URICUR, LYMEIGGIGMAB, LYMEABIGMQN, HLAB27                      Renal Function Markers Lab Results  Component Value Date   BUN 16 12/18/2017   CREATININE 0.97 12/18/2017   GFRAA >60 12/18/2017   GFRNONAA >60 12/18/2017                             Hepatic Function Markers Lab Results  Component Value Date   AST 70 (H) 04/03/2017   ALT 52  04/03/2017   ALBUMIN 3.9 04/03/2017   ALKPHOS 76 04/03/2017   LIPASE 27 04/03/2017                        Electrolytes Lab Results  Component Value Date   NA 141 12/18/2017   K 3.6 12/18/2017   CL 108 12/18/2017   CALCIUM 9.6 12/18/2017   MG 1.9 09/21/2015                        Neuropathy Markers No results found for: VITAMINB12, FOLATE, HGBA1C, HIV                      CNS Tests No results found for: COLORCSF, APPEARCSF, RBCCOUNTCSF, WBCCSF, POLYSCSF, LYMPHSCSF, EOSCSF, PROTEINCSF, GLUCCSF, JCVIRUS, CSFOLI, IGGCSF                      Bone Pathology Markers No results found for: VD25OH, FA213YQ6VHQ, [id number], [id number], 25OHVITD1, 25OHVITD2, 25OHVITD3, TESTOFREE, TESTOSTERONE                       Coagulation Parameters Lab Results  Component Value Date   PLT 305 12/18/2017                        Cardiovascular Markers Lab Results  Component Value Date   BNP 8.0 08/26/2014   CKTOTAL 78 09/07/2013   CKMB 0.5 09/07/2013   TROPONINI <0.03 09/01/2017   HGB 14.3 12/18/2017   HCT 44.5  12/18/2017                         CA Markers No results found for: CEA, CA125, LABCA2                      Note: Lab results reviewed.  Recent Diagnostic Imaging Results  DG C-Arm 1-60 Min-No Report Fluoroscopy was utilized by the requesting physician.  No radiographic  interpretation.   Complexity Note: Imaging results reviewed. Results shared with Ms. Agcaoili, using Layman's terms.                         Meds   Current Outpatient Medications:  .  albuterol (PROVENTIL HFA;VENTOLIN HFA) 108 (90 BASE) MCG/ACT inhaler, Inhale 2 puffs into the lungs every 6 (six) hours as needed for wheezing or shortness of breath., Disp: 1 Inhaler, Rfl: 2 .  glipiZIDE (GLUCOTROL) 5 MG tablet, Take 5 mg by mouth daily before breakfast., Disp: , Rfl:  .  ibuprofen (ADVIL,MOTRIN) 800 MG tablet, Take 1 tablet (800 mg total) by mouth every 8 (eight) hours as needed for moderate pain., Disp: 15 tablet, Rfl: 0 .  lidocaine (LIDODERM) 5 %, Place 1 patch onto the skin daily. Remove & Discard patch within 12 hours or as directed by MD, Disp: , Rfl:  .  metFORMIN (GLUCOPHAGE) 1000 MG tablet, Take 1 tablet (1,000 mg total) by mouth 2 (two) times daily with a meal., Disp: 60 tablet, Rfl: 11 .  SUMAtriptan-naproxen (TREXIMET) 85-500 MG per tablet, Take 1 tablet by mouth every 2 (two) hours as needed for migraine., Disp: , Rfl:  .  topiramate (TOPAMAX) 100 MG tablet, Take 100 mg by mouth 2 (two) times daily., Disp: , Rfl:  .  triamterene-hydrochlorothiazide (MAXZIDE-25) 37.5-25 MG per tablet, Take 1 tablet by mouth  daily., Disp: , Rfl:  .  verapamil (COVERA HS) 180 MG (CO) 24 hr tablet, Take 180 mg by mouth at bedtime., Disp: , Rfl:  .  amitriptyline (ELAVIL) 75 MG tablet, Take 1 tablet (75 mg total) by mouth at bedtime., Disp: 30 tablet, Rfl: 3 .  pregabalin (LYRICA) 100 MG capsule, Take 1 capsule (100 mg total) by mouth 3 (three) times daily., Disp: 90 capsule, Rfl: 0  ROS  Constitutional: Denies any fever or  chills Gastrointestinal: No reported hemesis, hematochezia, vomiting, or acute GI distress Musculoskeletal: Denies any acute onset joint swelling, redness, loss of ROM, or weakness Neurological: No reported episodes of acute onset apraxia, aphasia, dysarthria, agnosia, amnesia, paralysis, loss of coordination, or loss of consciousness  Allergies  Michele [name] has No Known Allergies.  [place]  Drug: Michele [name]  reports no history of drug use. Alcohol:  reports current alcohol use. Tobacco:  reports that Michele [name] has never smoked. Her smokeless tobacco use includes snuff. Medical:  has a past medical history of Diabetes mellitus without complication ([place]), Hypertension, and Migraines. Surgical: Michele [name]  has a past surgical history that includes Cesarean section; Knee surgery; Ankle surgery; Abdominal hysterectomy; and Wrist surgery. Family: family history is not on file.  Constitutional Exam  General appearance: Well nourished, well developed, and well hydrated. In no apparent acute distress Vitals:   03/20/18 1227  BP: 121/79  Pulse: 69  Resp: 18  Temp: 97.7 F (36.5 C)  TempSrc: Oral  SpO2: 100%  Weight: 260 lb (117.9 kg)  Height: _0  (1.676 m)   BMI Assessment: Estimated body mass index is 41.97 kg/m as calculated from the following:   Height as of this encounter: _1  (1.676 m).   Weight as of this encounter: 260 lb (117.9 kg).  BMI interpretation table: BMI level Category Range association with higher incidence of chronic pain  <18 kg/m2 Underweight   18.5-24.9 kg/m2 Ideal body weight   25-29.9 kg/m2 Overweight Increased incidence by 20%  30-34.9 kg/m2 Obese (Class I) Increased incidence by 68%  35-39.9 kg/m2 Severe obesity (Class II) Increased incidence by 136%  >40 kg/m2 Extreme obesity (Class III) Increased incidence by 254%   Michele [name]'s current BMI Ideal Body weight  Body mass index is 41.97 kg/m. Ideal body weight: 59.3 kg (130 lb 11.7 oz) Adjusted ideal body weight:  82.8 kg (182 lb 7 oz)   BMI Readings from Last 4 Encounters:  03/20/18 41.97 kg/m  02/19/18 40.35 kg/m  02/06/18 41.97 kg/m  12/18/17 40.35 kg/m   Wt Readings from Last 4 Encounters:  03/20/18 260 lb (117.9 kg)  02/19/18 250 lb (113.4 kg)  02/06/18 260 lb (117.9 kg)  12/18/17 250 lb (113.4 kg)  Psych/Mental status: Alert, oriented x 3 (person, place, & time)       Eyes: PERLA Respiratory: No evidence of acute respiratory distress  Cervical Spine Area Exam  Skin & Axial Inspection: No masses, redness, edema, swelling, or associated skin lesions Alignment: Symmetrical Functional ROM: Unrestricted ROM      Stability: No instability detected Muscle Tone/Strength: Functionally intact. No obvious neuro-muscular anomalies detected. Sensory (Neurological): Unimpaired Palpation: No palpable anomalies              Upper Extremity (UE) Exam    Side: Right upper extremity  Side: Left upper extremity  Skin & Extremity Inspection: Skin color, temperature, and hair growth are WNL. No peripheral edema or cyanosis. No masses, redness, swelling, asymmetry, or associated skin lesions. No contractures.  Skin & Extremity Inspection: Skin color, temperature, and hair growth are WNL. No peripheral edema or cyanosis. No masses, redness, swelling, asymmetry, or associated skin lesions. No contractures.  Functional ROM: Unrestricted ROM          Functional ROM: Unrestricted ROM          Muscle Tone/Strength: Functionally intact. No obvious neuro-muscular anomalies detected.  Muscle Tone/Strength: Functionally intact. No obvious neuro-muscular anomalies detected.  Sensory (Neurological): Unimpaired          Sensory (Neurological): Unimpaired          Palpation: No palpable anomalies              Palpation: No palpable anomalies              Provocative Test(s):  Phalen's test: deferred Tinel's test: deferred Apley's scratch test (touch opposite shoulder):  Action 1 (Across chest): deferred Action 2  (Overhead): deferred Action 3 (LB reach): deferred   Provocative Test(s):  Phalen's test: deferred Tinel's test: deferred Apley's scratch test (touch opposite shoulder):  Action 1 (Across chest): deferred Action 2 (Overhead): deferred Action 3 (LB reach): deferred    Thoracic Spine Area Exam  Skin & Axial Inspection: No masses, redness, or swelling Alignment: Symmetrical Functional ROM: Unrestricted ROM Stability: No instability detected Muscle Tone/Strength: Functionally intact. No obvious neuro-muscular anomalies detected. Sensory (Neurological): Unimpaired Muscle strength & Tone: No palpable anomalies  Lumbar Spine Area Exam  Skin & Axial Inspection: No masses, redness, or swelling Alignment: Symmetrical Functional ROM: Improved after treatment       Stability: No instability detected Muscle Tone/Strength: Functionally intact. No obvious neuro-muscular anomalies detected. Sensory (Neurological): Articular pain pattern Palpation: No palpable anomalies       Provocative Tests: Hyperextension/rotation test: (+) bilaterally for facet joint pain. Lumbar quadrant test (Kemp's test): deferred today       Lateral bending test: deferred today       Patrick's Maneuver: deferred today                   FABER* test: deferred today                   S-I anterior distraction/compression test: deferred today         S-I lateral compression test: deferred today         S-I Thigh-thrust test: deferred today         S-I Gaenslen's test: deferred today         *(Flexion, ABduction and External Rotation)  Gait & Posture Assessment  Ambulation: Unassisted Gait: Relatively normal for age and body habitus Posture: WNL   Lower Extremity Exam    Side: Right lower extremity  Side: Left lower extremity  Stability: No instability observed          Stability: No instability observed          Skin & Extremity Inspection: Skin color, temperature, and hair growth are WNL. No peripheral edema or  cyanosis. No masses, redness, swelling, asymmetry, or associated skin lesions. No contractures.  Skin & Extremity Inspection: Skin color, temperature, and hair growth are WNL. No peripheral edema or cyanosis. No masses, redness, swelling, asymmetry, or associated skin lesions. No contractures.  Functional ROM: Unrestricted ROM                  Functional ROM: Unrestricted ROM                  Muscle Tone/Strength:  Functionally intact. No obvious neuro-muscular anomalies detected.  Muscle Tone/Strength: Functionally intact. No obvious neuro-muscular anomalies detected.  Sensory (Neurological): Unimpaired        Sensory (Neurological): Unimpaired        DTR: Patellar: deferred today Achilles: deferred today Plantar: deferred today  DTR: Patellar: deferred today Achilles: deferred today Plantar: deferred today  Palpation: No palpable anomalies  Palpation: No palpable anomalies   Assessment  Primary Diagnosis & Pertinent Problem List: The primary encounter diagnosis was Lumbar facet joint syndrome. Diagnoses of Chronic bilateral low back pain without sciatica, Obesity, morbid, BMI 40.0-49.9 ([place]), and Chronic pain syndrome were also pertinent to this visit.  Status Diagnosis  Responding Responding Persistent 1. Lumbar facet joint syndrome   2. Chronic bilateral low back pain without sciatica   3. Obesity, morbid, BMI 40.0-49.9 ([place])   4. Chronic pain syndrome     Problems updated and reviewed during this visit: Problem  Lumbar Facet Joint Syndrome  Chronic Bilateral Low Back Pain Without Sciatica  Chronic Pain Syndrome   Michele [name] follows up today for postprocedural evaluation status post bilateral lumbar facet medial branch nerve blocks at L3, L4, L5, S1 due to lumbar facet arthropathy and lumbar spondylosis.  Michele [name] endorses pain relief and improvement in function after the nerve blocks however continues to have pain at work when Michele [name] is lifting heavy objects.  Michele [name] also has a history  of morbid obesity which is also contributing to her musculoskeletal pain.  Given that the nerve block was helpful, we discussed proceeding with the next steps which include diagnostic nerve block #2 followed by radiofrequency ablation.  Michele [name] is tried various anti-inflammatory medicines and membrane stabilizers.  Michele [name] is requesting low-dose opioid therapy.  I would feel more comfortable if the Michele [name] was seen by psychiatry to ensure no red flags to chronic opioid therapy, low dose in her.  In the meantime, I discussed increasing the Michele [name]'s Lyrica 200 mg 3 times a day and also increasing her amitriptyline from 50 mg to 75 mg.  Michele [name] does not endorse any side effects with these medications such as sedation, vivid dreams, altered cognition, drowsiness, leg swelling.  Plan: -Lumbar facet arthropathy: Repeat lumbar facet medial branch nerve blocks #2 at L3, L4, L5, S1.  After this Michele [name] could be a candidate for lumbar radiofrequency ablation -Increase Lyrica 200 mg 3 times a day -Increase amitriptyline to 75 mg nightly -Referral to psychiatry for consideration of chronic opioid therapy -Michele [name] counseled on weight loss and dieting strategies and suggestions.   Plan of Care  Pharmacotherapy (Medications Ordered): Meds ordered this encounter  Medications  . DISCONTD: pregabalin (LYRICA) 100 MG capsule    Sig: Take 1 capsule (100 mg total) by mouth 3 (three) times daily.    Dispense:  90 capsule    Refill:  0    Do not place this medication, or any other prescription from our practice, on "Automatic Refill". Michele [name] may have prescription filled one day early if pharmacy is closed on scheduled refill date.  Marland Kitchen amitriptyline (ELAVIL) 75 MG tablet    Sig: Take 1 tablet (75 mg total) by mouth at bedtime.    Dispense:  30 tablet    Refill:  3  . pregabalin (LYRICA) 100 MG capsule    Sig: Take 1 capsule (100 mg total) by mouth 3 (three) times daily.    Dispense:  90 capsule    Refill:  0     Do not place this medication, or any other prescription  from our practice, on "Automatic Refill". Michele [name] may have prescription filled one day early if pharmacy is closed on scheduled refill date.   Lab-work, procedure(s), and/or referral(s): Orders Placed This Encounter  Procedures  . LUMBAR FACET(MEDIAL BRANCH NERVE BLOCK) MBNB  . Ambulatory referral to Psychology    Provider-requested follow-up: Return in about 2 weeks (around 04/03/2018) for Procedure.  Future Appointments  Date Time Provider [place]  04/02/2018 11:00 AM Gillis Santa, MD [facility] None    Primary Care Physician: Madelyn Brunner, MD Location: [facility] Note by: Gillis Santa, M.D Date: 03/20/2018; Time: 7:43 AM  Michele [name] Instructions   GENERAL RISKS AND COMPLICATIONS  What are the risk, side effects and possible complications? Generally speaking, most procedures are safe.  However, with any procedure there are risks, side effects, and the possibility of complications.  The risks and complications are dependent upon the sites that are lesioned, or the type of nerve block to be performed.  The closer the procedure is to the spine, the more serious the risks are.  Great care is taken when placing the radio frequency needles, block needles or lesioning probes, but sometimes complications can occur. 1. Infection: Any time there is an injection through the skin, there is a risk of infection.  This is why sterile conditions are used for these blocks.  There are four possible types of infection. 1. Localized skin infection. 2. Central Nervous System Infection-This can be in the form of Meningitis, which can be deadly. 3. Epidural Infections-This can be in the form of an epidural abscess, which can cause pressure inside of the spine, causing compression of the spinal cord with subsequent paralysis. This would require an emergency surgery to decompress, and there are no guarantees that  the Michele [name] would recover from the paralysis. 4. Discitis-This is an infection of the intervertebral discs.  It occurs in about 1% of discography procedures.  It is difficult to treat and it may lead to surgery.        2. Pain: the needles have to go through skin and soft tissues, will cause soreness.       3. Damage to internal structures:  The nerves to be lesioned may be near blood vessels or    other nerves which can be potentially damaged.       4. Bleeding: Bleeding is more common if the Michele [name] is taking blood thinners such as  aspirin, Coumadin, Ticiid, Plavix, etc., or if he/Michele [name] have some genetic predisposition  such as hemophilia. Bleeding into the spinal canal can cause compression of the spinal  cord with subsequent paralysis.  This would require an emergency surgery to  decompress and there are no guarantees that the Michele [name] would recover from the  paralysis.       5. Pneumothorax:  Puncturing of a lung is a possibility, every time a needle is introduced in  the area of the chest or upper back.  Pneumothorax refers to free air around the  collapsed lung(s), inside of the thoracic cavity (chest cavity).  Another two possible  complications related to a similar event would include: Hemothorax and Chylothorax.   These are variations of the Pneumothorax, where instead of air around the collapsed  lung(s), you may have blood or chyle, respectively.       6. Spinal headaches: They may occur with any procedures in the area of the spine.       7. Persistent CSF (Cerebro-Spinal Fluid) leakage: This is  a rare problem, but may occur  with prolonged intrathecal or epidural catheters either due to the formation of a fistulous  track or a dural tear.       8. Nerve damage: By working so close to the spinal cord, there is always a possibility of  nerve damage, which could be as serious as a permanent spinal cord injury with  paralysis.       9. Death:  Although rare, severe deadly allergic reactions known  as "Anaphylactic  reaction" can occur to any of the medications used.      10. Worsening of the symptoms:  We can always make thing worse.  What are the chances of something like this happening? Chances of any of this occuring are extremely low.  By statistics, you have more of a chance of getting killed in a motor vehicle accident: while driving to the hospital than any of the above occurring .  Nevertheless, you should be aware that they are possibilities.  In general, it is similar to taking a shower.  Everybody knows that you can slip, hit your head and get killed.  Does that mean that you should not shower again?  Nevertheless always keep in mind that statistics do not mean anything if you happen to be on the wrong side of them.  Even if a procedure has a 1 (one) in a 1,000,000 (million) chance of going wrong, it you happen to be that one..Also, keep in mind that by statistics, you have more of a chance of having something go wrong when taking medications.  Who should not have this procedure? If you are on a blood thinning medication (e.g. Coumadin, Plavix, see list of "Blood Thinners"), or if you have an active infection going on, you should not have the procedure.  If you are taking any blood thinners, please inform your physician.  How should I prepare for this procedure?  Do not eat or drink anything at least six hours prior to the procedure.  Bring a driver with you .  It cannot be a taxi.  Come accompanied by an adult that can drive you back, and that is strong enough to help you if your legs get weak or numb from the local anesthetic.  Take all of your medicines the morning of the procedure with just enough water to swallow them.  If you have diabetes, make sure that you are scheduled to have your procedure done first thing in the morning, whenever possible.  If you have diabetes, take only half of your insulin dose and notify our nurse that you have done so as soon as you arrive at  the clinic.  If you are diabetic, but only take blood sugar pills (oral hypoglycemic), then do not take them on the morning of your procedure.  You may take them after you have had the procedure.  Do not take aspirin or any aspirin-containing medications, at least eleven (11) days prior to the procedure.  They may prolong bleeding.  Wear loose fitting clothing that may be easy to take off and that you would not mind if it got stained with Betadine or blood.  Do not wear any jewelry or perfume  Remove any nail coloring.  It will interfere with some of our monitoring equipment.  NOTE: Remember that this is not meant to be interpreted as a complete list of all possible complications.  Unforeseen problems may occur.  BLOOD THINNERS The following drugs contain aspirin or other products,  which can cause increased bleeding during surgery and should not be taken for 2 weeks prior to and 1 week after surgery.  If you should need take something for relief of minor pain, you may take acetaminophen which is found in Tylenol,m Datril, Anacin-3 and Panadol. It is not blood thinner. The products listed below are.  Do not take any of the products listed below in addition to any listed on your instruction sheet.  A.P.C or A.P.C with Codeine Codeine Phosphate Capsules #3 Ibuprofen Ridaura  ABC compound Congesprin Imuran rimadil  Advil Cope Indocin Robaxisal  Alka-Seltzer Effervescent Pain Reliever and Antacid Coricidin or Coricidin-D  Indomethacin Rufen  Alka-Seltzer plus Cold Medicine Cosprin Ketoprofen S-A-C Tablets  Anacin Analgesic Tablets or Capsules Coumadin Korlgesic Salflex  Anacin Extra Strength Analgesic tablets or capsules CP-2 Tablets Lanoril Salicylate  Anaprox Cuprimine Capsules Levenox Salocol  Anexsia-D Dalteparin Magan Salsalate  Anodynos Darvon compound Magnesium Salicylate Sine-off  Ansaid Dasin Capsules Magsal Sodium Salicylate  Anturane Depen Capsules Marnal Soma  APF Arthritis pain  formula Dewitt's Pills Measurin Stanback  Argesic Dia-Gesic Meclofenamic Sulfinpyrazone  Arthritis Bayer Timed Release Aspirin Diclofenac Meclomen Sulindac  Arthritis pain formula Anacin Dicumarol Medipren Supac  Analgesic (Safety coated) Arthralgen Diffunasal Mefanamic Suprofen  Arthritis Strength Bufferin Dihydrocodeine Mepro Compound Suprol  Arthropan liquid Dopirydamole Methcarbomol with Aspirin Synalgos  ASA tablets/Enseals Disalcid Micrainin Tagament  Ascriptin Doan's Midol Talwin  Ascriptin A/D Dolene Mobidin Tanderil  Ascriptin Extra Strength Dolobid Moblgesic Ticlid  Ascriptin with Codeine Doloprin or Doloprin with Codeine Momentum Tolectin  Asperbuf Duoprin Mono-gesic Trendar  Aspergum Duradyne Motrin or Motrin IB Triminicin  Aspirin plain, buffered or enteric coated Durasal Myochrisine Trigesic  Aspirin Suppositories Easprin Nalfon Trillsate  Aspirin with Codeine Ecotrin Regular or Extra Strength Naprosyn Uracel  Atromid-S Efficin Naproxen Ursinus  Auranofin Capsules Elmiron Neocylate Vanquish  Axotal Emagrin Norgesic Verin  Azathioprine Empirin or Empirin with Codeine Normiflo Vitamin E  Azolid Emprazil Nuprin Voltaren  Bayer Aspirin plain, buffered or children's or timed BC Tablets or powders Encaprin Orgaran Warfarin Sodium  Buff-a-Comp Enoxaparin Orudis Zorpin  Buff-a-Comp with Codeine Equegesic Os-Cal-Gesic   Buffaprin Excedrin plain, buffered or Extra Strength Oxalid   Bufferin Arthritis Strength Feldene Oxphenbutazone   Bufferin plain or Extra Strength Feldene Capsules Oxycodone with Aspirin   Bufferin with Codeine Fenoprofen Fenoprofen Pabalate or Pabalate-SF   Buffets II Flogesic Panagesic   Buffinol plain or Extra Strength Florinal or Florinal with Codeine Panwarfarin   Buf-Tabs Flurbiprofen Penicillamine   Butalbital Compound Four-way cold tablets Penicillin   Butazolidin Fragmin Pepto-Bismol   Carbenicillin Geminisyn Percodan   Carna Arthritis Reliever Geopen  Persantine   Carprofen Gold's salt Persistin   Chloramphenicol Goody's Phenylbutazone   Chloromycetin Haltrain Piroxlcam   Clmetidine heparin Plaquenil   Cllnoril Hyco-pap Ponstel   Clofibrate Hydroxy chloroquine Propoxyphen         Before stopping any of these medications, be sure to consult the physician who ordered them.  Some, such as Coumadin (Warfarin) are ordered to prevent or treat serious conditions such as "deep thrombosis", "pumonary embolisms", and other heart problems.  The amount of time that you may need off of the medication may also vary with the medication and the reason for which you were taking it.  If you are taking any of these medications, please make sure you notify your pain physician before you undergo any procedures.   Moderate Conscious Sedation, Adult Sedation is the use of medicines to promote relaxation and relieve discomfort and  anxiety. Moderate conscious sedation is a type of sedation. Under moderate conscious sedation, you are less alert than normal, but you are still able to respond to instructions, touch, or both. Moderate conscious sedation is used during short medical and dental procedures. It is milder than deep sedation, which is a type of sedation under which you cannot be easily woken up. It is also milder than general anesthesia, which is the use of medicines to make you unconscious. Moderate conscious sedation allows you to return to your regular activities sooner. Tell a health care provider about:  Any allergies you have.  All medicines you are taking, including vitamins, herbs, eye drops, creams, and over-the-counter medicines.  Use of steroids (by mouth or creams).  Any problems you or family members have had with sedatives and anesthetic medicines.  Any blood disorders you have.  Any surgeries you have had.  Any medical conditions you have, such as sleep apnea.  Whether you are pregnant or may be pregnant.  Any use of cigarettes,  alcohol, marijuana, or street drugs. What are the risks? Generally, this is a safe procedure. However, problems may occur, including:  Getting too much medicine (oversedation).  Nausea.  Allergic reaction to medicines.  Trouble breathing. If this happens, a breathing tube may be used to help with breathing. It will be removed when you are awake and breathing on your own.  Heart trouble.  Lung trouble. What happens before the procedure? Staying hydrated Follow instructions from your health care provider about hydration, which may include:  Up to 2 hours before the procedure - you may continue to drink clear liquids, such as water, clear fruit juice, black coffee, and plain tea. Eating and drinking restrictions Follow instructions from your health care provider about eating and drinking, which may include:  8 hours before the procedure - stop eating heavy meals or foods such as meat, fried foods, or fatty foods.  6 hours before the procedure - stop eating light meals or foods, such as toast or cereal.  6 hours before the procedure - stop drinking milk or drinks that contain milk.  2 hours before the procedure - stop drinking clear liquids. Medicine Ask your health care provider about:  Changing or stopping your regular medicines. This is especially important if you are taking diabetes medicines or blood thinners.  Taking medicines such as aspirin and ibuprofen. These medicines can thin your blood. Do not take these medicines before your procedure if your health care provider instructs you not to.  Tests and exams  You will have a physical exam.  You may have blood tests done to show: ? How well your kidneys and liver are working. ? How well your blood can clot. General instructions  Plan to have someone take you home from the hospital or clinic.  If you will be going home right after the procedure, plan to have someone with you for 24 hours. What happens during the  procedure?  An IV tube will be inserted into one of your veins.  Medicine to help you relax (sedative) will be given through the IV tube.  The medical or dental procedure will be performed. What happens after the procedure?  Your blood pressure, heart rate, breathing rate, and blood oxygen level will be monitored often until the medicines you were given have worn off.  Do not drive for 24 hours. This information is not intended to replace advice given to you by your health care provider. Make sure you discuss  any questions you have with your health care provider. Document Released: 11/14/2000 Document Revised: 07/26/2015 Document Reviewed: 06/11/2015 Elsevier Interactive Michele [name] Education  2019 [employer]  Facet Blocks Michele [name] Information  Description: The facets are joints in the spine between the vertebrae.  Like any joints in the body, facets can become irritated and painful.  Arthritis can also effect the facets.  By injecting steroids and local anesthetic in and around these joints, we can temporarily block the nerve supply to them.  Steroids act directly on irritated nerves and tissues to reduce selling and inflammation which often leads to decreased pain.  Facet blocks may be done anywhere along the spine from the neck to the low back depending upon the location of your pain.   After numbing the skin with local anesthetic (like Novocaine), a small needle is passed onto the facet joints under x-ray guidance.  You may experience a sensation of pressure while this is being done.  The entire block usually lasts about 15-25 minutes.   Conditions which may be treated by facet blocks:   Low back/buttock pain  Neck/shoulder pain  Certain types of headaches  Preparation for the injection:  1. Do not eat any solid food or dairy products within 8 hours of your appointment. 2. You may drink clear liquid up to 3 hours before appointment.  Clear liquids include water, black coffee,  juice or soda.  No milk or cream please. 3. You may take your regular medication, including pain medications, with a sip of water before your appointment.  Diabetics should hold regular insulin (if taken separately) and take 1/2 normal NPH dose the morning of the procedure.  Carry some sugar containing items with you to your appointment. 4. A driver must accompany you and be prepared to drive you home after your procedure. 5. Bring all your current medications with you. 6. An IV may be inserted and sedation may be given at the discretion of the physician. 7. A blood pressure cuff, EKG and other monitors will often be applied during the procedure.  Some patients may need to have extra oxygen administered for a short period. 8. You will be asked to provide medical information, including your allergies and medications, prior to the procedure.  We must know immediately if you are taking blood thinners (like Coumadin/Warfarin) or if you are allergic to IV iodine contrast (dye).  We must know if you could possible be pregnant.  Possible side-effects:   Bleeding from needle site  Infection (rare, may require surgery)  Nerve injury (rare)  Numbness & tingling (temporary)  Difficulty urinating (rare, temporary)  Spinal headache (a headache worse with upright posture)  Light-headedness (temporary)  Pain at injection site (serveral days)  Decreased blood pressure (rare, temporary)  Weakness in arm/leg (temporary)  Pressure sensation in back/neck (temporary)   Call if you experience:   Fever/chills associated with headache or increased back/neck pain  Headache worsened by an upright position  New onset, weakness or numbness of an extremity below the injection site  Hives or difficulty breathing (go to the emergency room)  Inflammation or drainage at the injection site(s)  Severe back/neck pain greater than usual  New symptoms which are concerning to you  Please  note:  Although the local anesthetic injected can often make your back or neck feel good for several hours after the injection, the pain will likely return. It takes 3-7 days for steroids to work.  You may not notice any pain relief for at  least one week.  If effective, we will often do a series of 2-3 injections spaced 3-6 weeks apart to maximally decrease your pain.  After the initial series, you may be a candidate for a more permanent nerve block of the facets.  If you have any questions, please call #[phone]) [facility]

## 2018-03-21 ENCOUNTER — Encounter: Payer: Self-pay | Admitting: Student in an Organized Health Care Education/Training Program

## 2018-03-21 DIAGNOSIS — M47816 Spondylosis without myelopathy or radiculopathy, lumbar region: Secondary | ICD-10-CM | POA: Insufficient documentation

## 2018-03-21 DIAGNOSIS — G8929 Other chronic pain: Secondary | ICD-10-CM | POA: Insufficient documentation

## 2018-03-21 DIAGNOSIS — M545 Low back pain: Secondary | ICD-10-CM

## 2018-03-21 DIAGNOSIS — G894 Chronic pain syndrome: Secondary | ICD-10-CM | POA: Insufficient documentation

## 2018-03-22 ENCOUNTER — Emergency Department
Admission: EM | Admit: 2018-03-22 | Discharge: 2018-03-22 | Disposition: A | Discharge status: Home / Self Care | Payer: PRIVATE HEALTH INSURANCE | Attending: Emergency Medicine | Admitting: Emergency Medicine

## 2018-03-22 ENCOUNTER — Other Ambulatory Visit: Payer: Self-pay

## 2018-03-22 ENCOUNTER — Encounter: Payer: Self-pay | Admitting: Emergency Medicine

## 2018-03-22 DIAGNOSIS — I10 Essential (primary) hypertension: Secondary | ICD-10-CM | POA: Insufficient documentation

## 2018-03-22 DIAGNOSIS — H60392 Other infective otitis externa, left ear: Secondary | ICD-10-CM

## 2018-03-22 DIAGNOSIS — E119 Type 2 diabetes mellitus without complications: Secondary | ICD-10-CM | POA: Diagnosis not present

## 2018-03-22 DIAGNOSIS — F17220 Nicotine dependence, chewing tobacco, uncomplicated: Secondary | ICD-10-CM | POA: Diagnosis not present

## 2018-03-22 DIAGNOSIS — Z79899 Other long term (current) drug therapy: Secondary | ICD-10-CM | POA: Insufficient documentation

## 2018-03-22 DIAGNOSIS — H9202 Otalgia, left ear: Secondary | ICD-10-CM | POA: Diagnosis present

## 2018-03-22 DIAGNOSIS — Z794 Long term (current) use of insulin: Secondary | ICD-10-CM | POA: Insufficient documentation

## 2018-03-22 MED ORDER — NEOMYCIN-POLYMYXIN-HC 3.5-10000-1 OT SOLN: 3.0000 [drp] | Freq: Three times a day (TID) | OTIC | 0 refills | Status: AC

## 2018-03-22 NOTE — ED Notes (Signed)
Pt verbalized understanding of discharge instructions. NAD at this time. 

## 2018-03-22 NOTE — ED Triage Notes (Signed)
Pt reports sore throat and left earache for 2 days; has not taken any medications for symptoms; has used sweet oil with no relief; pt in no acute distress

## 2018-03-22 NOTE — ED Provider Notes (Signed)
[facility] Emergency Department Provider Note  ____________________________________________  Time seen: Approximately 8:16 PM  I have reviewed the triage vital signs and the nursing notes.   HISTORY  Chief Complaint Sore Throat and Otalgia    HPI [name] is a 53 y.o. female who presents emergency department complaining of left ear pain, left-sided throat pain.  Per the patient, she has been using sweet oil to the left ear for symptom relief.  This is not improved her symptoms.  She denies any fevers or chills, nasal congestion, cough, abdominal pain, nausea or vomiting.  No other complaints at this time.    Past Medical History:  Diagnosis Date  . Diabetes mellitus without complication (HCC)   . Hypertension   . Migraines     Patient Active Problem List   Diagnosis Date Noted  . Lumbar facet joint syndrome 03/21/2018  . Chronic bilateral low back pain without sciatica 03/21/2018  . Chronic pain syndrome 03/21/2018  . Obesity, morbid, BMI 40.0-49.9 (HCC) 07/12/2017  . Bilateral occipital neuralgia 09/14/2016  . Neck pain 12/16/2014  . Migraines 04/28/2014  . Diabetes mellitus type 2, uncomplicated (HCC) 04/28/2014    Past Surgical History:  Procedure Laterality Date  . ABDOMINAL HYSTERECTOMY    . ANKLE SURGERY    . CESAREAN SECTION    . KNEE SURGERY    . WRIST SURGERY      Prior to Admission medications   Medication Sig Start Date End Date Taking? Authorizing Provider  albuterol (PROVENTIL HFA;VENTOLIN HFA) 108 (90 BASE) MCG/ACT inhaler Inhale 2 puffs into the lungs every 6 (six) hours as needed for wheezing or shortness of breath. 08/26/14  Yes Schaevitz, Myra Rudeavid Matthew, MD  amitriptyline (ELAVIL) 75 MG tablet Take 1 tablet (75 mg total) by mouth at bedtime. 03/20/18  Yes Edward JollyLateef, Bilal, MD  atorvastatin (LIPITOR) 20 MG tablet Take 20 mg by mouth daily at 6 PM.   Yes [provider]  glipiZIDE (GLUCOTROL) 5 MG tablet Take 5 mg  by mouth daily before breakfast.   Yes [provider]  ibuprofen (ADVIL,MOTRIN) 800 MG tablet Take 1 tablet (800 mg total) by mouth every 8 (eight) hours as needed for moderate pain. 12/16/17  Yes Joni ReiningSmith, Ronald K, PA-C  insulin glargine (LANTUS) 100 UNIT/ML injection Inject 14 Units into the skin at bedtime.   Yes [provider]  lidocaine (LIDODERM) 5 % Place 1 patch onto the skin daily. Remove & Discard patch within 12 hours or as directed by MD   Yes [provider]  pregabalin (LYRICA) 100 MG capsule Take 1 capsule (100 mg total) by mouth 3 (three) times daily. 03/20/18  Yes Edward JollyLateef, Bilal, MD  SUMAtriptan-naproxen (TREXIMET) 85-500 MG per tablet Take 1 tablet by mouth every 2 (two) hours as needed for migraine.   Yes [provider]  topiramate (TOPAMAX) 100 MG tablet Take 100 mg by mouth 2 (two) times daily.   Yes [provider]  triamterene-hydrochlorothiazide (MAXZIDE-25) 37.5-25 MG per tablet Take 1 tablet by mouth daily.   Yes [provider]  verapamil (COVERA HS) 180 MG (CO) 24 hr tablet Take 180 mg by mouth at bedtime.   Yes [provider]  metFORMIN (GLUCOPHAGE) 1000 MG tablet Take 1 tablet (1,000 mg total) by mouth 2 (two) times daily with a meal. Patient taking differently: Take 500 mg by mouth daily.  03/29/17 03/29/18  Emily FilbertWilliams, Stephaun Million E, MD  neomycin-polymyxin-hydrocortisone (CORTISPORIN) OTIC solution Place 3 drops into the left  ear 3 (three) times daily for 10 days. 03/22/18 04/01/18  Nima Kemppainen, Delorise Royals, PA-C    Allergies Patient has no known allergies.  Family History  Problem Relation Age of Onset  . Breast cancer Neg Hx     Social History Social History   Tobacco Use  . Smoking status: Never Smoker  . Smokeless tobacco: Current User    Types: Snuff  Substance Use Topics  . Alcohol use: Yes    Comment: occasionally  . Drug use: No     Review of Systems  Constitutional: No fever/chills Eyes:  No visual changes. No discharge ENT: Left ear and left-sided throat pain. Cardiovascular: no chest pain. Respiratory: no cough. No SOB. Gastrointestinal: No abdominal pain.  No nausea, no vomiting.  No diarrhea.  No constipation. Musculoskeletal: Negative for musculoskeletal pain. Skin: Negative for rash, abrasions, lacerations, ecchymosis. Neurological: Negative for headaches, focal weakness or numbness. 10-point ROS otherwise negative.  ____________________________________________   PHYSICAL EXAM:  VITAL SIGNS: ED Triage Vitals  Enc Vitals Group     BP 03/22/18 1953 135/89     Pulse Rate 03/22/18 1953 76     Resp 03/22/18 1953 18     Temp 03/22/18 1953 98.4 F (36.9 C)     Temp Source 03/22/18 1953 Oral     SpO2 03/22/18 1953 99 %     Weight 03/22/18 1954 260 lb (117.9 kg)     Height 03/22/18 1954 5\' 6"  (1.676 m)     Head Circumference --      Peak Flow --      Pain Score 03/22/18 1953 9     Pain Loc --      Pain Edu? --      Excl. in GC? --      Constitutional: Alert and oriented. Well appearing and in no acute distress. Eyes: Conjunctivae are normal. PERRL. EOMI. Head: Atraumatic. ENT:      Ears: EAC and TM on right is unremarkable.  EAC on left is edematous, mildly erythematous.  TM is not well visualized but what is does not appear to be injected or bulging.      Nose: No congestion/rhinnorhea.      Mouth/Throat: Mucous membranes are moist.  Oropharynx is nonerythematous and nonedematous.  Tonsils are unremarkable.  Uvula is midline. Neck: No stridor.   Hematological/Lymphatic/Immunilogical: No cervical lymphadenopathy.  Cardiovascular: Normal rate, regular rhythm. Normal S1 and S2.  Good peripheral circulation. Respiratory: Normal respiratory effort without tachypnea or retractions. Lungs CTAB. Good air entry to the bases with no decreased or absent breath sounds. Musculoskeletal: Full range of motion to all extremities. No gross deformities  appreciated. Neurologic:  Normal speech and language. No gross focal neurologic deficits are appreciated.  Skin:  Skin is warm, dry and intact. No rash noted. Psychiatric: Mood and affect are normal. Speech and behavior are normal. Patient exhibits appropriate insight and judgement.   ____________________________________________   LABS (all labs ordered are listed, but only abnormal results are displayed)  Labs Reviewed - No data to display ____________________________________________  EKG   ____________________________________________  RADIOLOGY   No results found.  ____________________________________________    PROCEDURES  Procedure(s) performed:    Procedures    Medications - No data to display   ____________________________________________   INITIAL IMPRESSION / ASSESSMENT AND PLAN / ED COURSE  Pertinent labs & imaging results that were available during my care of the patient were reviewed by me and considered in my medical decision making (see chart for  details).  Review of the [place] CSRS was performed in accordance of the [facility] prior to dispensing any controlled drugs.      Patient's diagnosis is consistent with otitis externa.  Patient presents emergency department complaining of left ear pain and left throat pain.  On exam, findings are consistent with otitis externa.  Exam was otherwise reassuring.  Patient will be started on Cortisporin antibiotic drops.  Follow-up primary care as needed.   Patient is given ED precautions to return to the ED for any worsening or new symptoms.     ____________________________________________  FINAL CLINICAL IMPRESSION(S) / ED DIAGNOSES  Final diagnoses:  Other infective acute otitis externa of left ear      NEW MEDICATIONS STARTED DURING THIS VISIT:  ED Discharge Orders         Ordered    neomycin-polymyxin-hydrocortisone (CORTISPORIN) OTIC solution  3 times daily     03/22/18 2018               This chart was dictated using voice recognition software/Dragon. Despite best efforts to proofread, errors can occur which can change the meaning. Any change was purely unintentional.    Racheal PatchesCuthriell, Jaymi Tinner D, PA-C 03/22/18 2025    Jeanmarie PlantMcShane, James A, MD 03/22/18 2127

## 2018-04-02 ENCOUNTER — Ambulatory Visit (HOSPITAL_BASED_OUTPATIENT_CLINIC_OR_DEPARTMENT_OTHER): Payer: PRIVATE HEALTH INSURANCE | Admitting: Student in an Organized Health Care Education/Training Program

## 2018-04-02 ENCOUNTER — Ambulatory Visit: Payer: Self-pay | Admitting: Student in an Organized Health Care Education/Training Program

## 2018-04-02 ENCOUNTER — Other Ambulatory Visit: Payer: Self-pay

## 2018-04-02 ENCOUNTER — Ambulatory Visit
Admission: RE | Admit: 2018-04-02 | Discharge: 2018-04-02 | Disposition: A | Discharge status: Home / Self Care | Payer: PRIVATE HEALTH INSURANCE | Source: Ambulatory Visit | Attending: Student in an Organized Health Care Education/Training Program | Admitting: Student in an Organized Health Care Education/Training Program

## 2018-04-02 ENCOUNTER — Encounter: Payer: Self-pay | Admitting: Student in an Organized Health Care Education/Training Program

## 2018-04-02 VITALS — BP 100/62 | HR 80 | Temp 98.0°F | Resp 17 | Ht 66.0 in | Wt 260.0 lb

## 2018-04-02 DIAGNOSIS — M47816 Spondylosis without myelopathy or radiculopathy, lumbar region: Secondary | ICD-10-CM | POA: Diagnosis not present

## 2018-04-02 MED ORDER — DEXAMETHASONE SODIUM PHOSPHATE 10 MG/ML IJ SOLN
INTRAMUSCULAR | Status: AC
  Filled 2018-04-02: qty 2

## 2018-04-02 MED ORDER — LIDOCAINE HCL 2 % IJ SOLN
20.0000 mL | Freq: Once | INTRAMUSCULAR | Status: AC
  Administered 2018-04-02: 400 mg

## 2018-04-02 MED ORDER — FENTANYL CITRATE (PF) 100 MCG/2ML IJ SOLN
INTRAMUSCULAR | Status: AC
  Filled 2018-04-02: qty 2

## 2018-04-02 MED ORDER — ROPIVACAINE HCL 2 MG/ML IJ SOLN
INTRAMUSCULAR | Status: AC
  Filled 2018-04-02: qty 20

## 2018-04-02 MED ORDER — FENTANYL CITRATE (PF) 100 MCG/2ML IJ SOLN
25.0000 ug | INTRAMUSCULAR | Status: DC | PRN
  Administered 2018-04-02: 100 ug via INTRAVENOUS

## 2018-04-02 MED ORDER — ROPIVACAINE HCL 2 MG/ML IJ SOLN
10.0000 mL | Freq: Once | INTRAMUSCULAR | Status: AC
  Administered 2018-04-02: 10 mL

## 2018-04-02 MED ORDER — ROPIVACAINE HCL 2 MG/ML IJ SOLN
INTRAMUSCULAR | Status: AC
  Filled 2018-04-02: qty 10

## 2018-04-02 MED ORDER — DEXAMETHASONE SODIUM PHOSPHATE 10 MG/ML IJ SOLN
10.0000 mg | Freq: Once | INTRAMUSCULAR | Status: AC
  Administered 2018-04-02: 10 mg

## 2018-04-02 MED ORDER — LIDOCAINE HCL 2 % IJ SOLN
INTRAMUSCULAR | Status: AC
  Filled 2018-04-02: qty 20

## 2018-04-02 MED ORDER — LACTATED RINGERS IV SOLN
1000.0000 mL | Freq: Once | INTRAVENOUS | Status: AC
  Administered 2018-04-02: 1000 mL via INTRAVENOUS

## 2018-04-02 NOTE — Progress Notes (Signed)
Safety precautions to be maintained throughout the outpatient stay will include: orient to surroundings, keep bed in low position, maintain call bell within reach at all times, provide assistance with transfer out of bed and ambulation.  

## 2018-04-02 NOTE — Progress Notes (Signed)
Patient's Name: Michele [name]  MRN: [id number]  Referring Provider: Rafael Bihari, MD  DOB: 12-Dec-1965  PCP: Rafael Bihari, MD  DOS: 04/02/2018  Note by: Edward Jolly, MD  Service setting: Ambulatory outpatient  Specialty: Interventional Pain Management  Patient type: Established  Location: [facility] (AMB) Pain Management Facility  Visit type: Interventional Procedure   Primary Reason for Visit: Interventional Pain Management Treatment. CC: Procedure and Back Pain  Procedure:          Anesthesia, Analgesia, Anxiolysis:  Type: Lumbar Facet, Medial Branch Block(s) #2  Primary Purpose: Diagnostic Region: Posterolateral Lumbosacral Spine Level: L3, L4, L5, & S1 Medial Branch Level(s). Injecting these levels blocks the L3-4, L4-5, and L5-S1 lumbar facet joints. Laterality: Bilateral  Type: Moderate (Conscious) Sedation combined with Local Anesthesia Indication(s): Analgesia and Anxiety Route: Intravenous (IV) IV Access: Secured Sedation: Meaningful verbal contact was maintained at all times during the procedure  Local Anesthetic: Lidocaine 1-2%  Position: Prone   Indications: 1. Lumbar facet joint syndrome    Pain Score: Pre-procedure: 10-Worst pain ever/10 Post-procedure: 0-No pain/10  Pre-op Assessment:  [name] is a 53 y.o. (year old), female patient, seen today for interventional treatment. She  has a past surgical history that includes Cesarean section; Knee surgery; Ankle surgery; Abdominal hysterectomy; and Wrist surgery. [name] has a current medication list which includes the following prescription(s): albuterol, amitriptyline, atorvastatin, glipizide, ibuprofen, insulin glargine, lidocaine, pregabalin, sumatriptan-naproxen, topiramate, triamterene-hydrochlorothiazide, verapamil, and metformin, and the following Facility-Administered Medications: fentanyl. Her primarily concern today is the Procedure and Back Pain  Initial Vital Signs:  Pulse/HCG Rate: 80ECG Heart  Rate: 80 Temp: 98.4 F (36.9 C) Resp: 16 BP: (!) 122/91 SpO2: 97 %  BMI: Estimated body mass index is 41.97 kg/m as calculated from the following:   Height as of this encounter: 5\' 6"  (1.676 m).   Weight as of this encounter: 260 lb (117.9 kg).  Risk Assessment: Allergies: Reviewed. She has No Known Allergies.  Allergy Precautions: None required Coagulopathies: Reviewed. None identified.  Blood-thinner therapy: None at this time Active Infection(s): Reviewed. None identified. [name] is afebrile  Site Confirmation: [name] was asked to confirm the procedure and laterality before marking the site Procedure checklist: Completed Consent: Before the procedure and under the influence of no sedative(s), amnesic(s), or anxiolytics, the patient was informed of the treatment options, risks and possible complications. To fulfill our ethical and legal obligations, as recommended by the American Medical Association's Code of Ethics, I have informed the patient of my clinical impression; the nature and purpose of the treatment or procedure; the risks, benefits, and possible complications of the intervention; the alternatives, including doing nothing; the risk(s) and benefit(s) of the alternative treatment(s) or procedure(s); and the risk(s) and benefit(s) of doing nothing. The patient was provided information about the general risks and possible complications associated with the procedure. These may include, but are not limited to: failure to achieve desired goals, infection, bleeding, organ or nerve damage, allergic reactions, paralysis, and death. In addition, the patient was informed of those risks and complications associated to Spine-related procedures, such as failure to decrease pain; infection (i.e.: Meningitis, epidural or intraspinal abscess); bleeding (i.e.: epidural hematoma, subarachnoid hemorrhage, or any other type of intraspinal or peri-dural bleeding); organ or nerve damage (i.e.:  Any type of peripheral nerve, nerve root, or spinal cord injury) with subsequent damage to sensory, motor, and/or autonomic systems, resulting in permanent pain, numbness, and/or weakness of one or several areas  of the body; allergic reactions; (i.e.: anaphylactic reaction); and/or death. Furthermore, the patient was informed of those risks and complications associated with the medications. These include, but are not limited to: allergic reactions (i.e.: anaphylactic or anaphylactoid reaction(s)); adrenal axis suppression; blood sugar elevation that in diabetics may result in ketoacidosis or comma; water retention that in patients with history of congestive heart failure may result in shortness of breath, pulmonary edema, and decompensation with resultant heart failure; weight gain; swelling or edema; medication-induced neural toxicity; particulate matter embolism and blood vessel occlusion with resultant organ, and/or nervous system infarction; and/or aseptic necrosis of one or more joints. Finally, the patient was informed that Medicine is not an exact science; therefore, there is also the possibility of unforeseen or unpredictable risks and/or possible complications that may result in a catastrophic outcome. The patient indicated having understood very clearly. We have given the patient no guarantees and we have made no promises. Enough time was given to the patient to ask questions, all of which were answered to the patient's satisfaction. Ms. Furnish has indicated that she wanted to continue with the procedure. Attestation: I, the ordering provider, attest that I have discussed with the patient the benefits, risks, side-effects, alternatives, likelihood of achieving goals, and potential problems during recovery for the procedure that I have provided informed consent. Date  Time: 04/02/2018 10:48 AM  Pre-Procedure Preparation:  Monitoring: As per clinic protocol. Respiration, ETCO2, SpO2, BP, heart rate  and rhythm monitor placed and checked for adequate function Safety Precautions: Patient was assessed for positional comfort and pressure points before starting the procedure. Time-out: I initiated and conducted the "Time-out" before starting the procedure, as per protocol. The patient was asked to participate by confirming the accuracy of the "Time Out" information. Verification of the correct person, site, and procedure were performed and confirmed by me, the nursing staff, and the patient. "Time-out" conducted as per Joint Commission's Universal Protocol ([email or web address].01.01.01). Time: 1126  Description of Procedure:          Laterality: Bilateral. The procedure was performed in identical fashion on both sides. Levels:   L3, L4, L5, & S1 Medial Branch Level(s) Area Prepped: Posterior Lumbosacral Region Prepping solution: ChloraPrep (2% chlorhexidine gluconate and 70% isopropyl alcohol) Safety Precautions: Aspiration looking for blood return was conducted prior to all injections. At no point did we inject any substances, as a needle was being advanced. Before injecting, the patient was told to immediately notify me if she was experiencing any new onset of "ringing in the ears, or metallic taste in the mouth". No attempts were made at seeking any paresthesias. Safe injection practices and needle disposal techniques used. Medications properly checked for expiration dates. SDV (single dose vial) medications used. After the completion of the procedure, all disposable equipment used was discarded in the proper designated medical waste containers. Local Anesthesia: Protocol guidelines were followed. The patient was positioned over the fluoroscopy table. The area was prepped in the usual manner. The time-out was completed. The target area was identified using fluoroscopy. A 12-in long, straight, sterile hemostat was used with fluoroscopic guidance to locate the targets for each level blocked. Once located, the skin was  marked with an approved surgical skin marker. Once all sites were marked, the skin (epidermis, dermis, and hypodermis), as well as deeper tissues (fat, connective tissue and muscle) were infiltrated with a small amount of a short-acting local anesthetic, loaded on a 10cc syringe with a 25G, 1.5-in  Needle. An appropriate amount of time  was allowed for local anesthetics to take effect before proceeding to the next step. Local Anesthetic: Lidocaine 2.0% The unused portion of the local anesthetic was discarded in the proper designated containers. Technical explanation of process:   L3 Medial Branch Nerve Block (MBB): The target area for the L3 medial branch is at the junction of the postero-lateral aspect of the superior articular process and the superior, posterior, and medial edge of the transverse process of L4. Under fluoroscopic guidance, a Quincke needle was inserted until contact was made with os over the superior postero-lateral aspect of the pedicular shadow (target area). After negative aspiration for blood, 1 mL of the nerve block solution was injected without difficulty or complication. The needle was removed intact. L4 Medial Branch Nerve Block (MBB): The target area for the L4 medial branch is at the junction of the postero-lateral aspect of the superior articular process and the superior, posterior, and medial edge of the transverse process of L5. Under fluoroscopic guidance, a Quincke needle was inserted until contact was made with os over the superior postero-lateral aspect of the pedicular shadow (target area). After negative aspiration for blood, 1mL of the nerve block solution was injected without difficulty or complication. The needle was removed intact. L5 Medial Branch Nerve Block (MBB): The target area for the L5 medial branch is at the junction of the postero-lateral aspect of the superior articular process and the superior, posterior, and medial edge of the sacral ala. Under fluoroscopic  guidance, a Quincke needle was inserted until contact was made with os over the superior postero-lateral aspect of the pedicular shadow (target area). After negative aspiration for blood, 1mL of the nerve block solution was injected without difficulty or complication. The needle was removed intact. S1 Medial Branch Nerve Block (MBB): The target area for the S1 medial branch is at the posterior and inferior 6 o'clock position of the L5-S1 facet joint. Under fluoroscopic guidance, the Quincke needle inserted for the L5 MBB was redirected until contact was made with os over the inferior and postero aspect of the sacrum, at the 6 o' clock position under the L5-S1 facet joint (Target area). After negative aspiration for blood, 1mL of the nerve block solution was injected without difficulty or complication. The needle was removed intact. Procedural Needles: 22-gauge, 3.5-inch, Quincke needles used for all levels. Nerve block solution: 10 cc solution: 8 cc of 0.2% Ropivacaine, 2 cc of Decadron 10 mg/cc. 1 cc injected at each level above bilaterally.  Total steroid dose= 20 mg daily. The unused portion of the solution was discarded in the proper designated containers.  Once the entire procedure was completed, the treated area was cleaned, making sure to leave some of the prepping solution back to take advantage of its long term bactericidal properties.   Illustration of the posterior view of the lumbar spine and the posterior neural structures. Laminae of L2 through S1 are labeled. DPRL5, dorsal primary ramus of L5; DPRS1, dorsal primary ramus of S1; DPR3, dorsal primary ramus of L3; FJ, facet (zygapophyseal) joint L3-L4; I, inferior articular process of L4; LB1, lateral branch of dorsal primary ramus of L1; IAB, inferior articular branches from L3 medial branch (supplies L4-L5 facet joint); IBP, intermediate branch plexus; MB3, medial branch of dorsal primary ramus of L3; NR3, third lumbar nerve root; S, superior  articular process of L5; SAB, superior articular branches from L4 (supplies L4-5 facet joint also); TP3, transverse process of L3.  Vitals:   04/02/18 1144 04/02/18 1152 04/02/18 1204  04/02/18 1213  BP: (!) 130/95 (!) 103/52 105/68 100/62  Pulse:      Resp: 13 16 16 17   Temp:  97.9 F (36.6 C)  98 F (36.7 C)  SpO2: 97% 100% 100% 100%  Weight:      Height:         Start Time: 1126 hrs. End Time: 1143 hrs.  Imaging Guidance (Spinal):          Type of Imaging Technique: Fluoroscopy Guidance (Spinal) Indication(s): Assistance in needle guidance and placement for procedures requiring needle placement in or near specific anatomical locations not easily accessible without such assistance. Exposure Time: Please see nurses notes. Contrast: None used. Fluoroscopic Guidance: I was personally present during the use of fluoroscopy. "Tunnel Vision Technique" used to obtain the best possible view of the target area. Parallax error corrected before commencing the procedure. "Direction-depth-direction" technique used to introduce the needle under continuous pulsed fluoroscopy. Once target was reached, antero-posterior, oblique, and lateral fluoroscopic projection used confirm needle placement in all planes. Images permanently stored in EMR. Interpretation: No contrast injected. I personally interpreted the imaging intraoperatively. Adequate needle placement confirmed in multiple planes. Permanent images saved into the patient's record.  Antibiotic Prophylaxis:   Anti-infectives (From admission, onward)   None     Indication(s): None identified  Post-operative Assessment:  Post-procedure Vital Signs:  Pulse/HCG Rate: 8076 Temp: 98 F (36.7 C) Resp: 17 BP: 100/62 SpO2: 100 %  EBL: None  Complications: No immediate post-treatment complications observed by team, or reported by patient.  Note: The patient tolerated the entire procedure well. A repeat set of vitals were taken after the  procedure and the patient was kept under observation following institutional policy, for this type of procedure. Post-procedural neurological assessment was performed, showing return to baseline, prior to discharge. The patient was provided with post-procedure discharge instructions, including a section on how to identify potential problems. Should any problems arise concerning this procedure, the patient was given instructions to immediately contact us, at any time, without hesitation. In any case, we plan to contact the patient by telephone for a follow-up status report regarding this interventional procedure.  Comments:  No additional relevant information. 5 out of 5 strength bilateral lower extremity: Plantar flexion, dorsiflexion, knee flexion, knee extension.  Plan of Care    Imaging Orders     DG C-Arm 1-60 Min-No Report Procedure Orders    No procedure(s) ordered today    Medications ordered for procedure: Meds ordered this encounter  Medications  . lactated ringers infusion 1,000 mL  . fentaNYL (SUBLIMAZE) injection 25-100 mcg    Make sure Narcan is available in the pyxis when using this medication. In the event of respiratory depression (RR< 8/min): Titrate NARCAN (naloxone) in increments of 0.1 to 0.2 mg IV at 2-3 minute intervals, until desired degree of reversal.  . ropivacaine (PF) 2 mg/mL (0.2%) (NAROPIN) injection 10 mL  . lidocaine (XYLOCAINE) 2 % (with pres) injection 400 mg  . dexamethasone (DECADRON) injection 10 mg  . dexamethasone (DECADRON) injection 10 mg   Medications administered: We administered lactated ringers, fentaNYL, ropivacaine (PF) 2 mg/mL (0.2%), lidocaine, dexamethasone, and dexamethasone.  See the medical record for exact dosing, route, and time of administration.  Disposition: Discharge home  Discharge Date & Time: 04/02/2018; 1213 hrs.     Future Appointments  Date Time Provider Department Center  04/30/2018  1:45 PM Edward Jolly, MD  [facility] None   Primary Care Physician: Rafael Bihari, MD  Location: [facility] Note by: Edward JollyBilal Aiza Vollrath, MD Date: 04/02/2018; Time: 1:07 PM  Disclaimer:  Medicine is not an Visual merchandiserexact science. The only guarantee in medicine is that nothing is guaranteed. It is important to note that the decision to proceed with this intervention was based on the information collected from the patient. The Data and conclusions were drawn from the patient's questionnaire, the interview, and the physical examination. Because the information was provided in large part by the patient, it cannot be guaranteed that it has not been purposely or unconsciously manipulated. Every effort has been made to obtain as much relevant data as possible for this evaluation. It is important to note that the conclusions that lead to this procedure are derived in large part from the available data. Always take into account that the treatment will also be dependent on availability of resources and existing treatment guidelines, considered by other Pain Management Practitioners as being common knowledge and practice, at the time of the intervention. For Medico-Legal purposes, it is also important to point out that variation in procedural techniques and pharmacological choices are the acceptable norm. The indications, contraindications, technique, and results of the above procedure should only be interpreted and judged by a Board-Certified Interventional Pain Specialist with extensive familiarity and expertise in the same exact procedure and technique.

## 2018-04-02 NOTE — Patient Instructions (Signed)

## 2018-04-03 ENCOUNTER — Telehealth: Payer: Self-pay | Admitting: *Deleted

## 2018-04-03 NOTE — Telephone Encounter (Signed)
Spoke with patient re; procedure on yesterday.  No questions or concerns.  

## 2018-04-04 ENCOUNTER — Telehealth: Payer: Self-pay | Admitting: Student in an Organized Health Care Education/Training Program

## 2018-04-04 NOTE — Telephone Encounter (Signed)
Patient called stating she needs work note for Jan 29 and 30. Please let her know when she can pick up.

## 2018-04-07 NOTE — Telephone Encounter (Signed)
Informed patient that I would have work note for her at the frone desk.

## 2018-04-10 ENCOUNTER — Emergency Department: Payer: PRIVATE HEALTH INSURANCE

## 2018-04-10 ENCOUNTER — Emergency Department
Admission: EM | Admit: 2018-04-10 | Discharge: 2018-04-10 | Disposition: A | Discharge status: Home / Self Care | Payer: PRIVATE HEALTH INSURANCE | Attending: Emergency Medicine | Admitting: Emergency Medicine

## 2018-04-10 ENCOUNTER — Encounter: Payer: Self-pay | Admitting: Emergency Medicine

## 2018-04-10 DIAGNOSIS — R739 Hyperglycemia, unspecified: Secondary | ICD-10-CM

## 2018-04-10 DIAGNOSIS — Z79899 Other long term (current) drug therapy: Secondary | ICD-10-CM | POA: Insufficient documentation

## 2018-04-10 DIAGNOSIS — I10 Essential (primary) hypertension: Secondary | ICD-10-CM | POA: Insufficient documentation

## 2018-04-10 DIAGNOSIS — Z794 Long term (current) use of insulin: Secondary | ICD-10-CM | POA: Insufficient documentation

## 2018-04-10 DIAGNOSIS — F17220 Nicotine dependence, chewing tobacco, uncomplicated: Secondary | ICD-10-CM | POA: Insufficient documentation

## 2018-04-10 DIAGNOSIS — E1165 Type 2 diabetes mellitus with hyperglycemia: Secondary | ICD-10-CM | POA: Insufficient documentation

## 2018-04-10 LAB — CBC
HCT: 44.2 % (ref 36.0–46.0)
Hemoglobin: 14.4 g/dL (ref 12.0–15.0)
MCH: 26.3 pg (ref 26.0–34.0)
MCHC: 32.6 g/dL (ref 30.0–36.0)
MCV: 80.8 fL (ref 80.0–100.0)
NRBC: 0 % (ref 0.0–0.2)
Platelets: 322 10*3/uL (ref 150–400)
RBC: 5.47 MIL/uL — AB (ref 3.87–5.11)
RDW: 13.2 % (ref 11.5–15.5)
WBC: 8.8 10*3/uL (ref 4.0–10.5)

## 2018-04-10 LAB — URINALYSIS, COMPLETE (UACMP) WITH MICROSCOPIC
BILIRUBIN URINE: NEGATIVE
Bacteria, UA: NONE SEEN
Glucose, UA: >=500 mg/dL — AB
Hgb urine dipstick: NEGATIVE
KETONES UR: NEGATIVE mg/dL
LEUKOCYTES UA: NEGATIVE
Nitrite: NEGATIVE
PH: 5 (ref 5.0–8.0)
PROTEIN: NEGATIVE mg/dL
Specific Gravity, Urine: 1.011 (ref 1.005–1.030)

## 2018-04-10 LAB — POCT PREGNANCY, URINE: PREG TEST UR: NEGATIVE

## 2018-04-10 LAB — GLUCOSE, CAPILLARY
Glucose-Capillary: 288 mg/dL — ABNORMAL HIGH (ref 70–99)
Glucose-Capillary: 329 mg/dL — ABNORMAL HIGH (ref 70–99)
Glucose-Capillary: 185 mg/dL — ABNORMAL HIGH (ref 70–99)

## 2018-04-10 LAB — BASIC METABOLIC PANEL
ANION GAP: 8 (ref 5–15)
BUN: 11 mg/dL (ref 6–20)
CALCIUM: 8.8 mg/dL — AB (ref 8.9–10.3)
CHLORIDE: 105 mmol/L (ref 98–111)
CO2: 23 mmol/L (ref 22–32)
Creatinine, Ser: 0.98 mg/dL (ref 0.44–1.00)
GFR calc non Af Amer: >60 mL/min (ref >60)
GLUCOSE: 303 mg/dL — AB (ref 70–99)
POTASSIUM: 2.9 mmol/L — AB (ref 3.5–5.1)
Sodium: 136 mmol/L (ref 135–145)

## 2018-04-10 LAB — TROPONIN I: Troponin I: <0.03 ng/mL (ref <0.03)

## 2018-04-10 MED ORDER — INSULIN ASPART 100 UNIT/ML ~~LOC~~ SOLN
4.0000 [IU] | Freq: Once | SUBCUTANEOUS | Status: AC
  Administered 2018-04-10: 4 [IU] via INTRAVENOUS
  Filled 2018-04-10: qty 1

## 2018-04-10 MED ORDER — SODIUM CHLORIDE 0.9 % IV BOLUS
1000.0000 mL | Freq: Once | INTRAVENOUS | Status: AC
  Administered 2018-04-10: 1000 mL via INTRAVENOUS

## 2018-04-10 NOTE — ED Notes (Signed)
CBG blood glucose 288.

## 2018-04-10 NOTE — ED Notes (Signed)
Pt able to ambulate to toilet with a steady gait.  

## 2018-04-10 NOTE — ED Provider Notes (Signed)
[facility] Emergency Department Provider Note  Time seen: 6:58 PM  I have reviewed the triage vital signs and the nursing notes.   HISTORY  Chief Complaint Hyperglycemia and Chest Pain    HPI Michele [name] is a 53 y.o. female with a past medical history of hypertension, diabetes, presents to the emergency department with complaints of elevated blood glucose, intermittent dizziness, feels dehydrated as well as mild shortness of breath.  According to the patient over the past 3 to 4 days her blood glucose has been elevated and she has been having difficulty getting it down below 300.  States she is also felt some mild shortness of breath over the past few days as well some intermittent mild chest discomfort.  States nausea but denies any vomiting, states occasional loose stool.  Denies any fever, cough or congestion, denies dysuria or hematuria.  Patient takes Lantus at night, metformin in the morning as well as glipizide.   Past Medical History:  Diagnosis Date  . Diabetes mellitus without complication (HCC)   . Hypertension   . Migraines     Patient Active Problem List   Diagnosis Date Noted  . Lumbar facet joint syndrome 03/21/2018  . Chronic bilateral low back pain without sciatica 03/21/2018  . Chronic pain syndrome 03/21/2018  . Obesity, morbid, BMI 40.0-49.9 (HCC) 07/12/2017  . Bilateral occipital neuralgia 09/14/2016  . Neck pain 12/16/2014  . Migraines 04/28/2014  . Diabetes mellitus type 2, uncomplicated (HCC) 04/28/2014    Past Surgical History:  Procedure Laterality Date  . ABDOMINAL HYSTERECTOMY    . ANKLE SURGERY    . CESAREAN SECTION    . KNEE SURGERY    . WRIST SURGERY      Prior to Admission medications   Medication Sig Start Date End Date Taking? Authorizing Provider  albuterol (PROVENTIL HFA;VENTOLIN HFA) 108 (90 BASE) MCG/ACT inhaler Inhale 2 puffs into the lungs every 6 (six) hours as needed for wheezing or shortness of  breath. 08/26/14   Schaevitz, Myra Rude, MD  amitriptyline (ELAVIL) 75 MG tablet Take 1 tablet (75 mg total) by mouth at bedtime. 03/20/18   Edward Jolly, MD  atorvastatin (LIPITOR) 20 MG tablet Take 20 mg by mouth daily at 6 PM.    [provider]  glipiZIDE (GLUCOTROL) 5 MG tablet Take 5 mg by mouth daily before breakfast.    [provider]  ibuprofen (ADVIL,MOTRIN) 800 MG tablet Take 1 tablet (800 mg total) by mouth every 8 (eight) hours as needed for moderate pain. 12/16/17   Joni Reining, PA-C  insulin glargine (LANTUS) 100 UNIT/ML injection Inject 14 Units into the skin at bedtime.    [provider]  lidocaine (LIDODERM) 5 % Place 1 patch onto the skin daily. Remove & Discard patch within 12 hours or as directed by MD    [provider]  metFORMIN (GLUCOPHAGE) 1000 MG tablet Take 1 tablet (1,000 mg total) by mouth 2 (two) times daily with a meal. Patient taking differently: Take 500 mg by mouth daily.  03/29/17 03/29/18  Emily Filbert, MD  pregabalin (LYRICA) 100 MG capsule Take 1 capsule (100 mg total) by mouth 3 (three) times daily. 03/20/18   Edward Jolly, MD  SUMAtriptan-naproxen (TREXIMET) 85-500 MG per tablet Take 1 tablet by mouth every 2 (two) hours as needed for migraine.    [provider]  topiramate (TOPAMAX) 100 MG tablet Take 100 mg by mouth 2 (two) times daily.  [provider]  triamterene-hydrochlorothiazide (MAXZIDE-25) 37.5-25 MG per tablet Take 1 tablet by mouth daily.    [provider]  verapamil (COVERA HS) 180 MG (CO) 24 hr tablet Take 180 mg by mouth at bedtime.    [provider]    No Known Allergies  Family History  Problem Relation Age of Onset  . Breast cancer Neg Hx     Social History Social History   Tobacco Use  . Smoking status: Never Smoker  . Smokeless tobacco: Current User    Types: Snuff  Substance Use Topics  . Alcohol use: Yes    Comment: occasionally   . Drug use: No    Review of Systems Constitutional: Negative for fever.  Intermittent dizziness x4 days. ENT: Negative for recent illness/congestion Cardiovascular: Mild intermittent chest discomfort x4 days. Respiratory: Intermittent shortness of breath x4 days. Gastrointestinal: Negative for abdominal pain, vomiting Genitourinary: Negative for urinary compaints Musculoskeletal: Negative for musculoskeletal complaints Skin: Negative for skin complaints  Neurological: Negative for headache All other ROS negative  ____________________________________________   PHYSICAL EXAM:  VITAL SIGNS: ED Triage Vitals  Enc Vitals Group     BP 04/10/18 1519 (!) 155/104     Pulse Rate 04/10/18 1519 95     Resp 04/10/18 1519 20     Temp 04/10/18 1519 98.6 F (37 C)     Temp Source 04/10/18 1519 Oral     SpO2 04/10/18 1519 100 %     Weight 04/10/18 1520 260 lb (117.9 kg)     Height 04/10/18 1520 5\' 6"  (1.676 m)     Head Circumference --      Peak Flow --      Pain Score 04/10/18 1531 9     Pain Loc --      Pain Edu? --      Excl. in GC? --    Constitutional: Alert and oriented. Well appearing and in no distress. Eyes: Normal exam ENT   Head: Normocephalic and atraumatic.   Mouth/Throat: Mucous membranes are moist. Cardiovascular: Normal rate, regular rhythm. No murmur Respiratory: Normal respiratory effort without tachypnea nor retractions. Breath sounds are clear.  Patient does have mild right chest wall tenderness to palpation. Gastrointestinal: Soft and nontender. No distention.   Musculoskeletal: Nontender with normal range of motion in all extremities. Neurologic:  Normal speech and language. No gross focal neurologic deficits  Skin:  Skin is warm, dry and intact.  Psychiatric: Mood and affect are normal.   ____________________________________________    EKG  EKG viewed and interpreted by myself shows a normal sinus rhythm at 95 bpm with a narrow QRS, normal axis,  normal intervals, no concerning ST changes.  ____________________________________________    RADIOLOGY  Chest x-ray is negative  ____________________________________________   INITIAL IMPRESSION / ASSESSMENT AND PLAN / ED COURSE  Pertinent labs & imaging results that were available during my care of the patient were reviewed by me and considered in my medical decision making (see chart for details).  Patient presents to the emergency department with complaints of elevated blood glucose, some nausea, dizziness as well as intermittent shortness of breath and chest pain.  Overall the patient appears very well, chest pain is reproducible on examination.  Reassuringly patient's labs show a blood glucose of 300, otherwise largely within normal limits/baseline for the patient including a negative troponin.  Chest x-ray is reassuring.  EKG is reassuring.  Patient is hyperglycemic around 300, we will treat her dizziness and hyperglycemia with IV  fluids, urinalysis is pending at this time.  If urinalysis is normal anticipate likely discharge home with PCP follow-up to adjust her diabetic regimen.  Patient agreeable to plan of care.  Patient's urinalysis is normal.  Anticipate discharge home once blood glucose has come down, after IV fluids.  Patient will follow-up with her doctor.  Patient agreeable to plan of care. ____________________________________________   FINAL CLINICAL IMPRESSION(S) / ED DIAGNOSES  Dizziness Hyperglycemia   Minna AntisPaduchowski, Kaian Fahs, MD 04/10/18 [phone]

## 2018-04-10 NOTE — Discharge Instructions (Addendum)
Please drink plenty of non-sugary fluids.  Please adhere to a very low carbohydrate diet.  Please follow-up with your doctor, call them tomorrow to inform them of today's ER visit.  Return to the emergency department for any symptoms personally concerning to yourself.

## 2018-04-10 NOTE — ED Notes (Signed)
This RN has notified pt to provide urine sample. This RN will continue to monitor

## 2018-04-10 NOTE — ED Notes (Signed)
Pt verbalized understanding of d/c instructions, rx, and f/u care. No further questions at this time. Pt assisted to exit via wheelchair. 

## 2018-04-10 NOTE — ED Notes (Signed)
Pt states she takes insulin at home and has been taking it as prescribed. Pt states "I just feel weak"; dizziness and SOB. Mid CP 8/10 NAD noted at this time.  Pt is watching TV at this time.

## 2018-04-10 NOTE — ED Triage Notes (Signed)
Patient presents to the ED with high blood sugar in the "300s" x 3 days and chest pain x 2 days.  Patient also reports feeling dizzy and nauseous.  Patient states if she places pressure to her chest with her hand, her chest pain improves.  Patient reports feeling very tired.

## 2018-04-30 ENCOUNTER — Ambulatory Visit: Payer: Self-pay | Admitting: Student in an Organized Health Care Education/Training Program

## 2018-07-28 ENCOUNTER — Encounter: Payer: Self-pay | Admitting: Emergency Medicine

## 2018-07-28 ENCOUNTER — Emergency Department
Admission: EM | Admit: 2018-07-28 | Discharge: 2018-07-28 | Disposition: A | Discharge status: Home / Self Care | Payer: HRSA Program | Attending: Emergency Medicine | Admitting: Emergency Medicine

## 2018-07-28 ENCOUNTER — Emergency Department: Payer: HRSA Program

## 2018-07-28 ENCOUNTER — Other Ambulatory Visit: Payer: Self-pay

## 2018-07-28 DIAGNOSIS — R079 Chest pain, unspecified: Secondary | ICD-10-CM | POA: Diagnosis present

## 2018-07-28 DIAGNOSIS — Z20828 Contact with and (suspected) exposure to other viral communicable diseases: Secondary | ICD-10-CM | POA: Insufficient documentation

## 2018-07-28 DIAGNOSIS — Z79899 Other long term (current) drug therapy: Secondary | ICD-10-CM | POA: Insufficient documentation

## 2018-07-28 DIAGNOSIS — Z7984 Long term (current) use of oral hypoglycemic drugs: Secondary | ICD-10-CM | POA: Diagnosis not present

## 2018-07-28 DIAGNOSIS — F17228 Nicotine dependence, chewing tobacco, with other nicotine-induced disorders: Secondary | ICD-10-CM | POA: Insufficient documentation

## 2018-07-28 DIAGNOSIS — E119 Type 2 diabetes mellitus without complications: Secondary | ICD-10-CM | POA: Diagnosis not present

## 2018-07-28 DIAGNOSIS — I10 Essential (primary) hypertension: Secondary | ICD-10-CM | POA: Diagnosis not present

## 2018-07-28 DIAGNOSIS — R071 Chest pain on breathing: Secondary | ICD-10-CM | POA: Insufficient documentation

## 2018-07-28 DIAGNOSIS — R0602 Shortness of breath: Secondary | ICD-10-CM | POA: Diagnosis not present

## 2018-07-28 LAB — BASIC METABOLIC PANEL
Anion gap: 10 (ref 5–15)
BUN: 15 mg/dL (ref 6–20)
CO2: 25 mmol/L (ref 22–32)
Calcium: 8.8 mg/dL — ABNORMAL LOW (ref 8.9–10.3)
Chloride: 104 mmol/L (ref 98–111)
Creatinine, Ser: 0.93 mg/dL (ref 0.44–1.00)
GFR calc Af Amer: >60 mL/min (ref >60)
GFR calc non Af Amer: >60 mL/min (ref >60)
Glucose, Bld: 160 mg/dL — ABNORMAL HIGH (ref 70–99)
Potassium: 3.2 mmol/L — ABNORMAL LOW (ref 3.5–5.1)
Sodium: 139 mmol/L (ref 135–145)

## 2018-07-28 LAB — CBC
HCT: 42.2 % (ref 36.0–46.0)
Hemoglobin: 13.7 g/dL (ref 12.0–15.0)
MCH: 26.5 pg (ref 26.0–34.0)
MCHC: 32.5 g/dL (ref 30.0–36.0)
MCV: 81.6 fL (ref 80.0–100.0)
Platelets: 276 10*3/uL (ref 150–400)
RBC: 5.17 MIL/uL — ABNORMAL HIGH (ref 3.87–5.11)
RDW: 13.2 % (ref 11.5–15.5)
WBC: 8.1 10*3/uL (ref 4.0–10.5)
nRBC: 0 % (ref 0.0–0.2)

## 2018-07-28 LAB — SARS CORONAVIRUS 2 BY RT PCR (HOSPITAL ORDER, PERFORMED IN ~~LOC~~ HOSPITAL LAB): SARS Coronavirus 2: NEGATIVE

## 2018-07-28 LAB — FIBRIN DERIVATIVES D-DIMER (ARMC ONLY): Fibrin derivatives D-dimer (ARMC): 464.63 ng/mL (FEU) (ref 0.00–499.00)

## 2018-07-28 LAB — TROPONIN I: Troponin I: <0.03 ng/mL (ref <0.03)

## 2018-07-28 MED ORDER — LIDOCAINE VISCOUS HCL 2 % MT SOLN
15.0000 mL | Freq: Once | OROMUCOSAL | Status: AC
  Administered 2018-07-28: 22:00:00 15 mL via ORAL
  Filled 2018-07-28: qty 15

## 2018-07-28 MED ORDER — MORPHINE SULFATE (PF) 4 MG/ML IV SOLN
INTRAVENOUS | Status: AC
  Filled 2018-07-28: qty 1

## 2018-07-28 MED ORDER — MORPHINE SULFATE (PF) 4 MG/ML IV SOLN
4.0000 mg | Freq: Once | INTRAVENOUS | Status: AC
  Administered 2018-07-28: 4 mg via INTRAVENOUS
  Filled 2018-07-28: qty 1

## 2018-07-28 MED ORDER — MORPHINE SULFATE (PF) 4 MG/ML IV SOLN
4.0000 mg | Freq: Once | INTRAVENOUS | Status: AC
  Administered 2018-07-28: 4 mg via INTRAVENOUS

## 2018-07-28 MED ORDER — ONDANSETRON HCL 4 MG/2ML IJ SOLN
INTRAMUSCULAR | Status: AC
  Filled 2018-07-28: qty 2

## 2018-07-28 MED ORDER — HYDROCODONE-ACETAMINOPHEN 5-325 MG PO TABS: 1.0000 | ORAL_TABLET | ORAL | 0 refills | Status: DC | PRN

## 2018-07-28 MED ORDER — ALUM & MAG HYDROXIDE-SIMETH 200-200-20 MG/5ML PO SUSP
30.0000 mL | Freq: Once | ORAL | Status: AC
  Administered 2018-07-28: 22:00:00 30 mL via ORAL
  Filled 2018-07-28: qty 30

## 2018-07-28 MED ORDER — ONDANSETRON HCL 4 MG/2ML IJ SOLN
4.0000 mg | Freq: Once | INTRAMUSCULAR | Status: AC
  Administered 2018-07-28: 4 mg via INTRAVENOUS

## 2018-07-28 NOTE — ED Notes (Signed)
Pt resting quietly with lights out in room. Pt reports no change with pain with last meds given.

## 2018-07-28 NOTE — ED Provider Notes (Signed)
[facility] Emergency Department Provider Note  Time seen: 8:55 PM  I have reviewed the triage vital signs and the nursing notes.   HISTORY  Chief Complaint Chest Pain and Shortness of Breath   HPI [name] is a 53 y.o. female with a past medical history of diabetes, hypertension, migraines, chronic pain, presents to the emergency department for chest pain.  According to the patient she has been in [place] over the past 3weeks a quarantine before being allowed to fly back home.  Patient states she was allowed to fly home Saturday, however last night she developed central chest pain that radiated to her back, worse with deep inspiration.  Patient states she has been feeling short of breath as well although denies any cough or fever.  Patient states the chest discomfort went away however it came back approximately 3 hours ago so she came to the emergency department for evaluation.  Patient denies any leg pain or swelling.  No history of blood clots in the past.  [facility].  Past Medical History:  Diagnosis Date  . Diabetes mellitus without complication (HCC)   . Hypertension   . Migraines     Patient Active Problem List   Diagnosis Date Noted  . Lumbar facet joint syndrome 03/21/2018  . Chronic bilateral low back pain without sciatica 03/21/2018  . Chronic pain syndrome 03/21/2018  . Obesity, morbid, BMI 40.0-49.9 (HCC) 07/12/2017  . Bilateral occipital neuralgia 09/14/2016  . Neck pain 12/16/2014  . Migraines 04/28/2014  . Diabetes mellitus type 2, uncomplicated (HCC) 04/28/2014    Past Surgical History:  Procedure Laterality Date  . ABDOMINAL HYSTERECTOMY    . ANKLE SURGERY    . CESAREAN SECTION    . KNEE SURGERY    . WRIST SURGERY      Prior to Admission medications   Medication Sig Start Date End Date Taking? Authorizing Provider  albuterol (PROVENTIL HFA;VENTOLIN HFA) 108 (90 BASE) MCG/ACT inhaler Inhale 2 puffs  into the lungs every 6 (six) hours as needed for wheezing or shortness of breath. 08/26/14   Schaevitz, Myra Rudeavid Matthew, MD  amitriptyline (ELAVIL) 75 MG tablet Take 1 tablet (75 mg total) by mouth at bedtime. 03/20/18   Edward JollyLateef, Bilal, MD  atorvastatin (LIPITOR) 20 MG tablet Take 20 mg by mouth daily at 6 PM.    [provider]  glipiZIDE (GLUCOTROL) 5 MG tablet Take 5 mg by mouth daily before breakfast.    [provider]  ibuprofen (ADVIL,MOTRIN) 800 MG tablet Take 1 tablet (800 mg total) by mouth every 8 (eight) hours as needed for moderate pain. 12/16/17   Joni ReiningSmith, Ronald K, PA-C  insulin glargine (LANTUS) 100 UNIT/ML injection Inject 14 Units into the skin at bedtime.    [provider]  lidocaine (LIDODERM) 5 % Place 1 patch onto the skin daily. Remove & Discard patch within 12 hours or as directed by MD    [provider]  metFORMIN (GLUCOPHAGE) 1000 MG tablet Take 1 tablet (1,000 mg total) by mouth 2 (two) times daily with a meal. Patient taking differently: Take 500 mg by mouth daily.  03/29/17 03/29/18  Emily FilbertWilliams, Jonathan E, MD  pregabalin (LYRICA) 100 MG capsule Take 1 capsule (100 mg total) by mouth 3 (three) times daily. 03/20/18   Edward JollyLateef, Bilal, MD  SUMAtriptan-naproxen (TREXIMET) 85-500 MG per tablet Take 1 tablet by mouth every 2 (two) hours as needed for migraine.    [provider]  topiramate (TOPAMAX) 100 MG tablet Take 100 mg by mouth 2 (two) times daily.    [provider]  triamterene-hydrochlorothiazide (MAXZIDE-25) 37.5-25 MG per tablet Take 1 tablet by mouth daily.    [provider]  verapamil (COVERA HS) 180 MG (CO) 24 hr tablet Take 180 mg by mouth at bedtime.    [provider]    No Known Allergies  Family History  Problem Relation Age of Onset  . Breast cancer Neg Hx     Social History Social History   Tobacco Use  . Smoking status: Never Smoker  . Smokeless tobacco: Current User    Types:  Snuff  Substance Use Topics  . Alcohol use: Yes    Comment: occasionally  . Drug use: No    Review of Systems Constitutional: Negative for fever. ENT: Negative for recent illness/congestion Cardiovascular: Positive for central chest pain radiating to her back Respiratory: Positive for shortness of breath Gastrointestinal: Negative for abdominal pain, vomiting  Musculoskeletal: Negative for musculoskeletal complaints Neurological: Negative for headache All other ROS negative  ____________________________________________   PHYSICAL EXAM:  VITAL SIGNS: ED Triage Vitals  Enc Vitals Group     BP 07/28/18 2003 134/72     Pulse Rate 07/28/18 2003 86     Resp 07/28/18 2003 18     Temp 07/28/18 2003 98.7 F (37.1 C)     Temp Source 07/28/18 2003 Oral     SpO2 07/28/18 2003 99 %     Weight 07/28/18 2035 260 lb (117.9 kg)     Height 07/28/18 2035 5\' 6"  (1.676 m)     Head Circumference --      Peak Flow --      Pain Score --      Pain Loc --      Pain Edu? --      Excl. in GC? --    Constitutional: Alert and oriented. Well appearing and in no distress. Eyes: Normal exam ENT      Head: Normocephalic and atraumatic.      Mouth/Throat: Mucous membranes are moist. Cardiovascular: Normal rate, regular rhythm. No murmur Respiratory: Normal respiratory effort without tachypnea nor retractions. Breath sounds are clear  Gastrointestinal: Soft and nontender. No distention.   Musculoskeletal: Nontender with normal range of motion in all extremities.  Neurologic:  Normal speech and language. No gross focal neurologic deficits Skin:  Skin is warm, dry and intact.  Psychiatric: Mood and affect are normal.   ____________________________________________    EKG  EKG viewed and interpreted by myself shows a normal sinus rhythm 83 bpm with a narrow QRS, normal axis, normal intervals, no concerning ST changes.  ____________________________________________    RADIOLOGY  Chest x-ray  negative  ____________________________________________   INITIAL IMPRESSION / ASSESSMENT AND PLAN / ED COURSE  Pertinent labs & imaging results that were available during my care of the patient were reviewed by me and considered in my medical decision making (see chart for details).   Patient presents to the emergency department for central chest pain radiating to her back along with shortness of breath since last night.  Currently the patient appears well, no distress, normal vitals including 100% room air saturation throughout my entire examination.  However given the patient's complaint of central chest pain worse with deep inspiration we will check a d-dimer, will check labs including cardiac enzymes.  We will obtain a chest x-ray as well as a coronavirus swab.  Patient agreeable to plan of care.  Patient's labs are largely within normal limits including a negative troponin.  D-dimer is negative.  Chest x-ray is negative. Coronavirus test pending.  We will discharge patient with my normal chest pain return precautions.  Overall the patient appears well, no acute distress.  Annalucia Laino was evaluated in Emergency Department on 07/28/2018 for the symptoms described in the history of present illness. She was evaluated in the context of the global COVID-19 pandemic, which necessitated consideration that the patient might be at risk for infection with the SARS-CoV-2 virus that causes COVID-19. Institutional protocols and algorithms that pertain to the evaluation of patients at risk for COVID-19 are in a state of rapid change based on information released by regulatory bodies including the CDC and federal and state organizations. These policies and algorithms were followed during the patient's care in the ED.  ____________________________________________   FINAL CLINICAL IMPRESSION(S) / ED DIAGNOSES  Chest pain   Minna Antis, MD 07/28/18 2259

## 2018-07-28 NOTE — ED Notes (Signed)
Pt to the er for midsternal chest pain that radiates to the upper left chest and difficulty breathing. When asked about sob, pt states she has pain with a deep breath. Current sats are 99% on room air. Pt reports pain moving through to her back. Pt was in [place] for several weeks and flew back a few days ago. Pt says she did have to sleep in the airport for several days.

## 2018-07-28 NOTE — ED Notes (Signed)
Patient resting comfortably. NAD.

## 2018-07-28 NOTE — ED Notes (Signed)
Patient AAOx4. Vitals Stable. NAD. 

## 2018-07-28 NOTE — ED Notes (Signed)
Pt calling out for more pain meds. MD aware.

## 2018-07-28 NOTE — ED Triage Notes (Signed)
Pt reports increased SOB since last night, worsening this evening. Pt recently flew from [place], arriving back on Saturday. Pt denies N/V and fever.

## 2018-08-19 ENCOUNTER — Other Ambulatory Visit: Payer: Self-pay

## 2018-08-19 ENCOUNTER — Emergency Department
Admission: EM | Admit: 2018-08-19 | Discharge: 2018-08-19 | Disposition: A | Discharge status: Home / Self Care | Payer: Self-pay | Attending: Emergency Medicine | Admitting: Emergency Medicine

## 2018-08-19 ENCOUNTER — Encounter: Payer: Self-pay | Admitting: Emergency Medicine

## 2018-08-19 DIAGNOSIS — I10 Essential (primary) hypertension: Secondary | ICD-10-CM | POA: Insufficient documentation

## 2018-08-19 DIAGNOSIS — E119 Type 2 diabetes mellitus without complications: Secondary | ICD-10-CM | POA: Insufficient documentation

## 2018-08-19 DIAGNOSIS — Z79899 Other long term (current) drug therapy: Secondary | ICD-10-CM | POA: Insufficient documentation

## 2018-08-19 DIAGNOSIS — F17220 Nicotine dependence, chewing tobacco, uncomplicated: Secondary | ICD-10-CM | POA: Insufficient documentation

## 2018-08-19 DIAGNOSIS — M25561 Pain in right knee: Secondary | ICD-10-CM | POA: Insufficient documentation

## 2018-08-19 DIAGNOSIS — M545 Low back pain: Secondary | ICD-10-CM | POA: Insufficient documentation

## 2018-08-19 DIAGNOSIS — Z794 Long term (current) use of insulin: Secondary | ICD-10-CM | POA: Insufficient documentation

## 2018-08-19 DIAGNOSIS — G8929 Other chronic pain: Secondary | ICD-10-CM | POA: Insufficient documentation

## 2018-08-19 DIAGNOSIS — M25562 Pain in left knee: Secondary | ICD-10-CM | POA: Insufficient documentation

## 2018-08-19 MED ORDER — TRAMADOL HCL 50 MG PO TABS: 50.0000 mg | ORAL_TABLET | Freq: Four times a day (QID) | ORAL | 0 refills | Status: DC | PRN

## 2018-08-19 MED ORDER — ORPHENADRINE CITRATE 30 MG/ML IJ SOLN
60.0000 mg | Freq: Two times a day (BID) | INTRAMUSCULAR | Status: DC
  Administered 2018-08-19: 60 mg via INTRAMUSCULAR
  Filled 2018-08-19: qty 2

## 2018-08-19 MED ORDER — KETOROLAC TROMETHAMINE 30 MG/ML IJ SOLN
30.0000 mg | Freq: Once | INTRAMUSCULAR | Status: AC
  Administered 2018-08-19: 19:00:00 30 mg via INTRAMUSCULAR
  Filled 2018-08-19: qty 1

## 2018-08-19 MED ORDER — OXYCODONE HCL 5 MG PO TABS
5.0000 mg | ORAL_TABLET | Freq: Once | ORAL | Status: AC
  Administered 2018-08-19: 21:00:00 5 mg via ORAL
  Filled 2018-08-19: qty 1

## 2018-08-19 MED ORDER — MELOXICAM 15 MG PO TABS: 15.0000 mg | ORAL_TABLET | Freq: Every day | ORAL | 0 refills | Status: DC

## 2018-08-19 NOTE — ED Provider Notes (Signed)
[facility] Emergency Department Provider Note ____________________________________________  Time seen: Approximately 6:55 PM  I have reviewed the triage vital signs and the nursing notes.   HISTORY  Chief Complaint Knee Pain and Back Pain    HPI Michele [name] is a 53 y.o. female who presents to the emergency department for evaluation and treatment of lower back pain and bilateral knee pain.  Patient reports those are chronic, however because she is not working right now and lost her healthcare insurance she has been unable to continue seeing her primary care provider as well as the pain management provider.  She states that the pain increased in severity 3 days ago and she has been unable to get any relief with over-the-counter medications.  She states that her back "locks."  She states that she is unable to turn over in bed or rise from lying to sitting than standing position without assistance from her husband.   Past Medical History:  Diagnosis Date  . Diabetes mellitus without complication ([place])   . Hypertension   . Migraines     Patient Active Problem List   Diagnosis Date Noted  . Lumbar facet joint syndrome 03/21/2018  . Chronic bilateral low back pain without sciatica 03/21/2018  . Chronic pain syndrome 03/21/2018  . Obesity, morbid, BMI 40.0-49.9 ([place]) 07/12/2017  . Bilateral occipital neuralgia 09/14/2016  . Neck pain 12/16/2014  . Migraines 04/28/2014  . Diabetes mellitus type 2, uncomplicated ([place]) 60/73/7106    Past Surgical History:  Procedure Laterality Date  . ABDOMINAL HYSTERECTOMY    . ANKLE SURGERY    . CESAREAN SECTION    . KNEE SURGERY    . WRIST SURGERY      Prior to Admission medications   Medication Sig Start Date End Date Taking? Authorizing Provider  albuterol (PROVENTIL HFA;VENTOLIN HFA) 108 (90 BASE) MCG/ACT inhaler Inhale 2 puffs into the lungs every 6 (six) hours as needed for wheezing or shortness of breath.  08/26/14   Schaevitz, Randall An, MD  amitriptyline (ELAVIL) 75 MG tablet Take 1 tablet (75 mg total) by mouth at bedtime. 03/20/18   Gillis Santa, MD  atorvastatin (LIPITOR) 20 MG tablet Take 20 mg by mouth daily at 6 PM.    [provider]  glipiZIDE (GLUCOTROL) 5 MG tablet Take 5 mg by mouth daily before breakfast.    [provider]  insulin glargine (LANTUS) 100 UNIT/ML injection Inject 14 Units into the skin at bedtime.    [provider]  lidocaine (LIDODERM) 5 % Place 1 patch onto the skin daily. Remove & Discard patch within 12 hours or as directed by MD    [provider]  meloxicam (MOBIC) 15 MG tablet Take 1 tablet (15 mg total) by mouth daily. 08/19/18   Camauri Craton, Johnette Abraham B, FNP  metFORMIN (GLUCOPHAGE) 1000 MG tablet Take 1 tablet (1,000 mg total) by mouth 2 (two) times daily with a meal. Patient taking differently: Take 500 mg by mouth daily.  03/29/17 03/29/18  Earleen Newport, MD  pregabalin (LYRICA) 100 MG capsule Take 1 capsule (100 mg total) by mouth 3 (three) times daily. 03/20/18   Gillis Santa, MD  SUMAtriptan-naproxen (TREXIMET) 85-500 MG per tablet Take 1 tablet by mouth every 2 (two) hours as needed for migraine.    [provider]  topiramate (TOPAMAX) 100 MG tablet Take 100 mg by mouth 2 (two) times daily.    [provider]  traMADol (ULTRAM) 50 MG tablet Take 1  tablet (50 mg total) by mouth every 6 (six) hours as needed. 08/19/18   Juanya Villavicencio B, FNP  triamterene-hydrochlorothiazide (MAXZIDE-25) 37.5-25 MG per tablet Take 1 tablet by mouth daily.    [provider]  verapamil (COVERA HS) 180 MG (CO) 24 hr tablet Take 180 mg by mouth at bedtime.    [provider]    Allergies Patient has no known allergies.  Family History  Problem Relation Age of Onset  . Breast cancer Neg Hx     Social History Social History   Tobacco Use  . Smoking status: Never Smoker  . Smokeless tobacco: Current  User    Types: Snuff  Substance Use Topics  . Alcohol use: Yes    Comment: occasionally  . Drug use: No    Review of Systems Constitutional: Negative for fever. Cardiovascular: Negative for chest pain. Respiratory: Negative for shortness of breath. Musculoskeletal: Positive for transverse lumbar back pain and bilateral knee pain. Skin: Negative for open wounds or lesions. Neurological: Negative for decrease in sensation  ____________________________________________   PHYSICAL EXAM:  VITAL SIGNS: ED Triage Vitals  Enc Vitals Group     BP 08/19/18 1828 128/83     Pulse Rate 08/19/18 1828 84     Resp 08/19/18 1828 20     Temp 08/19/18 1828 98.5 F (36.9 C)     Temp Source 08/19/18 1828 Oral     SpO2 08/19/18 1828 100 %     Weight 08/19/18 1830 260 lb (117.9 kg)     Height 08/19/18 1830 5\' 6"  (1.676 m)     Head Circumference --      Peak Flow --      Pain Score 08/19/18 1828 10     Pain Loc --      Pain Edu? --      Excl. in GC? --     Constitutional: Alert and oriented. Well appearing and in no acute distress. Eyes: Conjunctivae are clear without discharge or drainage Head: Atraumatic Neck: Supple. Respiratory: No cough. Respirations are even and unlabored. Musculoskeletal: Diffuse transverse lumbar pain on light palpation.  Patient observed rolling over from her back to her left side without any assistance.  Active range of motion of bilateral knees observed during movement in bed as well.  Neurologic: Awake, alert, oriented x4.  Motor and sensory function is intact. Skin: No open wounds or lesions noted over the areas patient indicates are painful. Psychiatric: Affect and behavior are appropriate.  ____________________________________________   LABS (all labs ordered are listed, but only abnormal results are displayed)  Labs Reviewed - No data to display ____________________________________________  RADIOLOGY  Not indicated.  Lumbar spine image from  October 2019 reviewed.  No acute abnormalities identified ____________________________________________   PROCEDURES  Procedures  ____________________________________________   INITIAL IMPRESSION / ASSESSMENT AND PLAN / ED COURSE  Clearance CootsLaura Ann Eddie is a 53 y.o. who presents to the emergency department for treatment and evaluation of transverse lumbar pain and bilateral knee pain.  No new injuries.  Lumbar spine image 12/16/2017 negative for abnormality per radiology.  Toradol and Norflex ordered.  Patient reports little relief after Toradol and Norflex.  Pain is chronic and not likely going to be well controlled before leaving the ER. She states that she needs to get home to her family because she was just notified that her brother attempted suicide. I will give her a single dose of oxycodone and discharge her home with prescriptions as listed below. Because she  is now without insurance, she will be given information about [facility] and encouraged to schedule a follow up appointment.  Medications  orphenadrine (NORFLEX) injection 60 mg (60 mg Intramuscular Given 08/19/18 1901)  oxyCODONE (Oxy IR/ROXICODONE) immediate release tablet 5 mg (has no administration in time range)  ketorolac (TORADOL) 30 MG/ML injection 30 mg (30 mg Intramuscular Given 08/19/18 1901)    Pertinent labs & imaging results that were available during my care of the patient were reviewed by me and considered in my medical decision making (see chart for details).  _________________________________________   FINAL CLINICAL IMPRESSION(S) / ED DIAGNOSES  Final diagnoses:  Chronic bilateral low back pain without sciatica  Chronic pain of both knees    ED Discharge Orders         Ordered    traMADol (ULTRAM) 50 MG tablet  Every 6 hours PRN     08/19/18 2052    meloxicam (MOBIC) 15 MG tablet  Daily     08/19/18 2052           If controlled substance prescribed during this visit, 12 month  history viewed on the [facility] prior to issuing an initial prescription for Schedule II or III opiod.   Chinita Pesterriplett, Chidiebere Wynn B, FNP 08/19/18 2154    Arnaldo NatalMalinda, Paul F, MD 08/19/18 2228

## 2018-08-19 NOTE — ED Triage Notes (Signed)
Pt states the pain in her lower back is from the disc. Was going to get it worked on but then lost her job/insurance. 800mg  ibuprofen has been ineffective recently.

## 2018-08-19 NOTE — ED Notes (Signed)
See triage note  Presents with lower back pain  States pain started couple of days ago  Hx of "disc problems"  Also is having some bilateral knee pain

## 2018-08-19 NOTE — ED Triage Notes (Signed)
Pt states bilateral knee and back pain for the past 3 days. Pt states it's making it very hard to walk. NAD.

## 2018-08-29 ENCOUNTER — Other Ambulatory Visit: Payer: Self-pay

## 2018-08-29 ENCOUNTER — Emergency Department
Admission: EM | Admit: 2018-08-29 | Discharge: 2018-08-29 | Disposition: A | Discharge status: Home / Self Care | Payer: Self-pay | Attending: Emergency Medicine | Admitting: Emergency Medicine

## 2018-08-29 ENCOUNTER — Encounter: Payer: Self-pay | Admitting: Emergency Medicine

## 2018-08-29 DIAGNOSIS — Z79899 Other long term (current) drug therapy: Secondary | ICD-10-CM | POA: Insufficient documentation

## 2018-08-29 DIAGNOSIS — M545 Low back pain, unspecified: Secondary | ICD-10-CM

## 2018-08-29 DIAGNOSIS — G8929 Other chronic pain: Secondary | ICD-10-CM | POA: Insufficient documentation

## 2018-08-29 DIAGNOSIS — I10 Essential (primary) hypertension: Secondary | ICD-10-CM | POA: Insufficient documentation

## 2018-08-29 DIAGNOSIS — E119 Type 2 diabetes mellitus without complications: Secondary | ICD-10-CM | POA: Insufficient documentation

## 2018-08-29 MED ORDER — TRAMADOL HCL 50 MG PO TABS: 50.0000 mg | ORAL_TABLET | Freq: Four times a day (QID) | ORAL | 0 refills | Status: AC | PRN

## 2018-08-29 MED ORDER — KETOROLAC TROMETHAMINE 60 MG/2ML IM SOLN
60.0000 mg | Freq: Once | INTRAMUSCULAR | Status: AC
  Administered 2018-08-29: 60 mg via INTRAMUSCULAR
  Filled 2018-08-29: qty 2

## 2018-08-29 MED ORDER — LIDOCAINE 5 % EX PTCH
1.0000 | MEDICATED_PATCH | CUTANEOUS | Status: DC
  Administered 2018-08-29: 1 via TRANSDERMAL
  Filled 2018-08-29: qty 1

## 2018-08-29 MED ORDER — ORPHENADRINE CITRATE 30 MG/ML IJ SOLN
60.0000 mg | Freq: Two times a day (BID) | INTRAMUSCULAR | Status: DC
  Administered 2018-08-29: 60 mg via INTRAMUSCULAR
  Filled 2018-08-29: qty 2

## 2018-08-29 MED ORDER — HYDROMORPHONE HCL 1 MG/ML IJ SOLN
1.0000 mg | Freq: Once | INTRAMUSCULAR | Status: AC
  Administered 2018-08-29: 1 mg via INTRAMUSCULAR
  Filled 2018-08-29: qty 1

## 2018-08-29 NOTE — ED Notes (Signed)
See triage note: pt has chronic lower back pain; was seen here for the same on 6/16

## 2018-08-29 NOTE — ED Notes (Signed)
Pt states that she has tried to see doctor for follow up but they "have no openings" and won't accept her without insurance. Pt states her back pain has worsened in last month.

## 2018-08-29 NOTE — ED Triage Notes (Signed)
Pt to ED via POV c/o Back pain that has been going on for "a while". Pt was seen on 6/16 for same. Pt states that she is unable to sleep due to the pain. Pt is in NAD.

## 2018-08-29 NOTE — ED Provider Notes (Signed)
[facility] Emergency Department Provider Note   ____________________________________________   First MD Initiated Contact with Patient 08/29/18 1849     (approximate)  I have reviewed the triage vital signs and the nursing notes.   HISTORY  Chief Complaint Back Pain    HPI Michele [name] is a 53 y.o. female patient presents with back pain.  Patient seen this facility 10 days ago and advised to [facility].  Patient that they would not see her secondary to lack of insurance.  Patient she cannot sleep secondary to the pain.  Patient was formerly followed by her PCP and pain management.  With the loss of insurance she has not been able to keep her pain under control.  Patient rates the pain as a 10/10.  Patient described the pain is "sharp/achy".  Patient denies radicular component to her back pain.  Patient denies bladder bowel dysfunction.  No palliative measure for complaint.         Past Medical History:  Diagnosis Date  . Diabetes mellitus without complication ([facility])   . Hypertension   . Migraines     Patient Active Problem List   Diagnosis Date Noted  . Lumbar facet joint syndrome 03/21/2018  . Chronic bilateral low back pain without sciatica 03/21/2018  . Chronic pain syndrome 03/21/2018  . Obesity, morbid, BMI 40.0-49.9 ([facility]) 07/12/2017  . Bilateral occipital neuralgia 09/14/2016  . Neck pain 12/16/2014  . Migraines 04/28/2014  . Diabetes mellitus type 2, uncomplicated ([facility]) 04/28/2014    Past Surgical History:  Procedure Laterality Date  . ABDOMINAL HYSTERECTOMY    . ANKLE SURGERY    . CESAREAN SECTION    . KNEE SURGERY    . WRIST SURGERY      Prior to Admission medications   Medication Sig Start Date End Date Taking? Authorizing Provider  albuterol (PROVENTIL HFA;VENTOLIN HFA) 108 (90 BASE) MCG/ACT inhaler Inhale 2 puffs into the lungs every 6 (six) hours as needed for wheezing or shortness of breath.  08/26/14   Schaevitz, Myra Rudeavid Matthew, MD  amitriptyline (ELAVIL) 75 MG tablet Take 1 tablet (75 mg total) by mouth at bedtime. 03/20/18   Edward JollyLateef, Bilal, MD  atorvastatin (LIPITOR) 20 MG tablet Take 20 mg by mouth daily at 6 PM.    [provider]  glipiZIDE (GLUCOTROL) 5 MG tablet Take 5 mg by mouth daily before breakfast.    [provider]  insulin glargine (LANTUS) 100 UNIT/ML injection Inject 14 Units into the skin at bedtime.    [provider]  lidocaine (LIDODERM) 5 % Place 1 patch onto the skin daily. Remove & Discard patch within 12 hours or as directed by MD    [provider]  meloxicam (MOBIC) 15 MG tablet Take 1 tablet (15 mg total) by mouth daily. 08/19/18   Triplett, Rulon Eisenmengerari B, FNP  metFORMIN (GLUCOPHAGE) 1000 MG tablet Take 1 tablet (1,000 mg total) by mouth 2 (two) times daily with a meal. Patient taking differently: Take 500 mg by mouth daily.  03/29/17 03/29/18  Emily FilbertWilliams, Jonathan E, MD  pregabalin (LYRICA) 100 MG capsule Take 1 capsule (100 mg total) by mouth 3 (three) times daily. 03/20/18   Edward JollyLateef, Bilal, MD  SUMAtriptan-naproxen (TREXIMET) 85-500 MG per tablet Take 1 tablet by mouth every 2 (two) hours as needed for migraine.    [provider]  topiramate (TOPAMAX) 100 MG tablet Take 100 mg by mouth 2 (two) times daily.    [provider]  traMADol (ULTRAM) 50 MG tablet Take 1 tablet (50 mg total) by mouth every 6 (six) hours as needed for up to 3 days. 08/29/18 09/01/18  Joni ReiningSmith,  K, PA-C  triamterene-hydrochlorothiazide (MAXZIDE-25) 37.5-25 MG per tablet Take 1 tablet by mouth daily.    [provider]  verapamil (COVERA HS) 180 MG (CO) 24 hr tablet Take 180 mg by mouth at bedtime.    [provider]    Allergies Patient has no known allergies.  Family History  Problem Relation Age of Onset  . Breast cancer Neg Hx     Social History Social History   Tobacco Use  . Smoking status: Never Smoker  .  Smokeless tobacco: Current User    Types: Snuff  Substance Use Topics  . Alcohol use: Yes    Comment: occasionally  . Drug use: No    Review of Systems Constitutional: No fever/chills Eyes: No visual changes. ENT: No sore throat. Cardiovascular: Denies chest pain. Respiratory: Denies shortness of breath. Gastrointestinal: No abdominal pain.  No nausea, no vomiting.  No diarrhea.  No constipation. Genitourinary: Negative for dysuria. Musculoskeletal: Negative for back pain. Skin: Negative for rash. Neurological: Negative for headaches, focal weakness or numbness. Endocrine:  Diabetes and hypertension. ____________________________________________   PHYSICAL EXAM:  VITAL SIGNS: ED Triage Vitals  Enc Vitals Group     BP 08/29/18 1809 140/71     Pulse Rate 08/29/18 1809 80     Resp 08/29/18 1809 18     Temp 08/29/18 1809 98.5 F (36.9 C)     Temp Source 08/29/18 1809 Oral     SpO2 08/29/18 1809 98 %     Weight 08/29/18 1810 260 lb (117.9 kg)     Height 08/29/18 1810 5\' 6"  (1.676 m)     Head Circumference --      Peak Flow --      Pain Score 08/29/18 1809 10     Pain Loc --      Pain Edu? --      Excl. in GC? --    Constitutional: Alert and oriented. Well appearing and in no acute distress.  Morbid obesity. Cardiovascular: Normal rate, regular rhythm. Grossly normal heart sounds.  Good peripheral circulation. Respiratory: Normal respiratory effort.  No retractions. Lungs CTAB. Gastrointestinal: Soft and nontender. No distention. No abdominal bruits. No CVA tenderness. Genitourinary: Deferred Musculoskeletal: Patient body habitus limits the exam.  No obvious spinal deformity.  Patient is moderate guarding palpation of L4-S1.  No lower extremity tenderness nor edema.  No joint effusions. Neurologic:  Normal speech and language. No gross focal neurologic deficits are appreciated. No gait instability. Skin:  Skin is warm, dry and intact. No rash noted. Psychiatric: Mood and  affect are normal. Speech and behavior are normal.  ____________________________________________   LABS (all labs ordered are listed, but only abnormal results are displayed)  Labs Reviewed - No data to display ____________________________________________  EKG   ____________________________________________  RADIOLOGY  ED MD interpretation:    Official radiology report(s): No results found.  ____________________________________________   PROCEDURES  Procedure(s) performed (including Critical Care):  Procedures   ____________________________________________   INITIAL IMPRESSION / ASSESSMENT AND PLAN / ED COURSE  As part of my medical decision making, I reviewed the following data within the electronic MEDICAL RECORD NUMBER        Michele [name] was evaluated in Emergency Department on 08/29/2018 for the symptoms described in the history of present illness. She was evaluated  in the context of the global COVID-19 pandemic, which necessitated consideration that the patient might be at risk for infection with the SARS-CoV-2 virus that causes COVID-19. Institutional protocols and algorithms that pertain to the evaluation of patients at risk for COVID-19 are in a state of rapid change based on information released by regulatory bodies including the CDC and federal and state organizations. These policies and algorithms were followed during the patient's care in the ED.  Patient presents for exacerbation of her chronic back pain.  Discussed with patient rationale for ER not treating chronic pain.  [facility].  Lidoderm patches applied to patient's lower back.  Patient given Toradol and Norflex prior to departure.      ____________________________________________   FINAL CLINICAL IMPRESSION(S) / ED DIAGNOSES  Final diagnoses:  Chronic midline low back pain without sciatica     ED Discharge Orders         Ordered    traMADol  (ULTRAM) 50 MG tablet  Every 6 hours PRN     08/29/18 1914           Note:  This document was prepared using Dragon voice recognition software and may include unintentional dictation errors.    Sable Feil, PA-C 08/29/18 1914    Earleen Newport, MD 08/29/18 [phone]

## 2018-09-09 ENCOUNTER — Encounter: Payer: Self-pay | Admitting: Emergency Medicine

## 2018-09-09 ENCOUNTER — Other Ambulatory Visit: Payer: Self-pay

## 2018-09-09 DIAGNOSIS — Z794 Long term (current) use of insulin: Secondary | ICD-10-CM | POA: Insufficient documentation

## 2018-09-09 DIAGNOSIS — Z79899 Other long term (current) drug therapy: Secondary | ICD-10-CM | POA: Insufficient documentation

## 2018-09-09 DIAGNOSIS — E119 Type 2 diabetes mellitus without complications: Secondary | ICD-10-CM | POA: Insufficient documentation

## 2018-09-09 DIAGNOSIS — I10 Essential (primary) hypertension: Secondary | ICD-10-CM | POA: Insufficient documentation

## 2018-09-09 DIAGNOSIS — R0789 Other chest pain: Secondary | ICD-10-CM | POA: Insufficient documentation

## 2018-09-09 LAB — BASIC METABOLIC PANEL
Anion gap: 10 (ref 5–15)
BUN: 12 mg/dL (ref 6–20)
CO2: 25 mmol/L (ref 22–32)
Calcium: 9.4 mg/dL (ref 8.9–10.3)
Chloride: 106 mmol/L (ref 98–111)
Creatinine, Ser: 0.95 mg/dL (ref 0.44–1.00)
GFR calc Af Amer: >60 mL/min (ref >60)
GFR calc non Af Amer: >60 mL/min (ref >60)
Glucose, Bld: 148 mg/dL — ABNORMAL HIGH (ref 70–99)
Potassium: 3.3 mmol/L — ABNORMAL LOW (ref 3.5–5.1)
Sodium: 141 mmol/L (ref 135–145)

## 2018-09-09 LAB — TROPONIN I (HIGH SENSITIVITY): Troponin I (High Sensitivity): 3 ng/L (ref <18)

## 2018-09-09 LAB — CBC
HCT: 42.2 % (ref 36.0–46.0)
Hemoglobin: 13.6 g/dL (ref 12.0–15.0)
MCH: 26.4 pg (ref 26.0–34.0)
MCHC: 32.2 g/dL (ref 30.0–36.0)
MCV: 81.8 fL (ref 80.0–100.0)
Platelets: 271 10*3/uL (ref 150–400)
RBC: 5.16 MIL/uL — ABNORMAL HIGH (ref 3.87–5.11)
RDW: 13.4 % (ref 11.5–15.5)
WBC: 7.6 10*3/uL (ref 4.0–10.5)
nRBC: 0 % (ref 0.0–0.2)

## 2018-09-09 NOTE — ED Triage Notes (Addendum)
Patient presents to the ED with chest pain that began this am.  Patient states pain is to the center of her chest and radiates down right arm.  Patient reports some shortness of breath as well.  Patient describes pain as burning and tightness.  Patient states pain is worse with deep breaths.  Patient states she has had difficulty swallowing since this am.  Patient is speaking in full sentences at this time without obvious difficulty.

## 2018-09-09 NOTE — ED Triage Notes (Signed)
First Nurse Note:  C/O right sided chest pain, pain radiates down right arm.  C/o worsening pain with inspiration.  AAOx3.  Skin warm and dry. NAD.  No SOB/ DOE

## 2018-09-10 ENCOUNTER — Emergency Department: Payer: Self-pay

## 2018-09-10 ENCOUNTER — Emergency Department
Admission: EM | Admit: 2018-09-10 | Discharge: 2018-09-10 | Disposition: A | Discharge status: Home / Self Care | Payer: Self-pay | Attending: Emergency Medicine | Admitting: Emergency Medicine

## 2018-09-10 DIAGNOSIS — R079 Chest pain, unspecified: Secondary | ICD-10-CM

## 2018-09-10 LAB — TROPONIN I (HIGH SENSITIVITY): Troponin I (High Sensitivity): 3 ng/L (ref <18)

## 2018-09-10 MED ORDER — LIDOCAINE VISCOUS HCL 2 % MT SOLN
15.0000 mL | Freq: Once | OROMUCOSAL | Status: AC
  Administered 2018-09-10: 15 mL via ORAL
  Filled 2018-09-10: qty 15

## 2018-09-10 MED ORDER — ALUM & MAG HYDROXIDE-SIMETH 200-200-20 MG/5ML PO SUSP
30.0000 mL | Freq: Once | ORAL | Status: AC
  Administered 2018-09-10: 30 mL via ORAL
  Filled 2018-09-10: qty 30

## 2018-09-10 NOTE — ED Provider Notes (Signed)
[facility] Emergency Department Provider Note  Time seen: 2:14 AM  I have reviewed the triage vital signs and the nursing notes.   HISTORY  Chief Complaint Chest Pain   HPI Michele [name] is a 53 y.o. female with a past medical history of hypertension, diabetes, presents emergency department for chest burning.  According to the patient around 6:00 this morning she ate a spicy sausage.  She states immediately after eating she developed a burning sensation in her chest.  Patient states she thought it would go away if she took a nap or drink something cold but states throughout the day it is not gone away she continues to have a burning sensation which she describes as someone lighting a match in her chest.  Denies any history of reflux in the past.  Patient denies any shortness of breath cough or fever.  Denies any nausea vomiting or diarrhea   Past Medical History:  Diagnosis Date  . Diabetes mellitus without complication (HCC)   . Hypertension   . Migraines     Patient Active Problem List   Diagnosis Date Noted  . Lumbar facet joint syndrome 03/21/2018  . Chronic bilateral low back pain without sciatica 03/21/2018  . Chronic pain syndrome 03/21/2018  . Obesity, morbid, BMI 40.0-49.9 (HCC) 07/12/2017  . Bilateral occipital neuralgia 09/14/2016  . Neck pain 12/16/2014  . Migraines 04/28/2014  . Diabetes mellitus type 2, uncomplicated (HCC) 04/28/2014    Past Surgical History:  Procedure Laterality Date  . ABDOMINAL HYSTERECTOMY    . ANKLE SURGERY    . CESAREAN SECTION    . KNEE SURGERY    . WRIST SURGERY      Prior to Admission medications   Medication Sig Start Date End Date Taking? Authorizing Provider  albuterol (PROVENTIL HFA;VENTOLIN HFA) 108 (90 BASE) MCG/ACT inhaler Inhale 2 puffs into the lungs every 6 (six) hours as needed for wheezing or shortness of breath. 08/26/14   Schaevitz, Myra Rudeavid Matthew, MD  amitriptyline (ELAVIL) 75 MG tablet Take  1 tablet (75 mg total) by mouth at bedtime. 03/20/18   Edward JollyLateef, Bilal, MD  atorvastatin (LIPITOR) 20 MG tablet Take 20 mg by mouth daily at 6 PM.    [provider]  glipiZIDE (GLUCOTROL) 5 MG tablet Take 5 mg by mouth daily before breakfast.    [provider]  insulin glargine (LANTUS) 100 UNIT/ML injection Inject 14 Units into the skin at bedtime.    [provider]  lidocaine (LIDODERM) 5 % Place 1 patch onto the skin daily. Remove & Discard patch within 12 hours or as directed by MD    [provider]  meloxicam (MOBIC) 15 MG tablet Take 1 tablet (15 mg total) by mouth daily. 08/19/18   Triplett, Rulon Eisenmengerari B, FNP  metFORMIN (GLUCOPHAGE) 1000 MG tablet Take 1 tablet (1,000 mg total) by mouth 2 (two) times daily with a meal. Patient taking differently: Take 500 mg by mouth daily.  03/29/17 03/29/18  Emily FilbertWilliams, Jonathan E, MD  pregabalin (LYRICA) 100 MG capsule Take 1 capsule (100 mg total) by mouth 3 (three) times daily. 03/20/18   Edward JollyLateef, Bilal, MD  SUMAtriptan-naproxen (TREXIMET) 85-500 MG per tablet Take 1 tablet by mouth every 2 (two) hours as needed for migraine.    [provider]  topiramate (TOPAMAX) 100 MG tablet Take 100 mg by mouth 2 (two) times daily.    [provider]  triamterene-hydrochlorothiazide (MAXZIDE-25) 37.5-25 MG per tablet Take 1 tablet by mouth  daily.    [provider]  verapamil (COVERA HS) 180 MG (CO) 24 hr tablet Take 180 mg by mouth at bedtime.    [provider]    No Known Allergies  Family History  Problem Relation Age of Onset  . Breast cancer Neg Hx     Social History Social History   Tobacco Use  . Smoking status: Never Smoker  . Smokeless tobacco: Current User    Types: Snuff  Substance Use Topics  . Alcohol use: Yes    Comment: occasionally  . Drug use: No    Review of Systems Constitutional: Negative for fever. ENT: Negative for recent illness/congestion Cardiovascular: Chest  burning Respiratory: Negative for shortness of breath. Gastrointestinal: Negative for abdominal pain, vomiting Musculoskeletal: Negative for musculoskeletal complaints Skin: Negative for skin complaints  Neurological: Negative for headache All other ROS negative  ____________________________________________   PHYSICAL EXAM:  VITAL SIGNS: ED Triage Vitals  Enc Vitals Group     BP 09/09/18 1902 129/87     Pulse Rate 09/09/18 1902 71     Resp 09/09/18 1902 20     Temp 09/09/18 1902 97.9 F (36.6 C)     Temp Source 09/09/18 1902 Oral     SpO2 09/09/18 1902 98 %     Weight 09/09/18 1900 260 lb (117.9 kg)     Height 09/09/18 1900 5\' 6"  (1.676 m)     Head Circumference --      Peak Flow --      Pain Score 09/09/18 1859 10     Pain Loc --      Pain Edu? --      Excl. in [place]? --     Constitutional: Alert and oriented. Well appearing and in no distress. Eyes: Normal exam ENT      Head: Normocephalic and atraumatic      Mouth/Throat: Mucous membranes are moist. Cardiovascular: Normal rate, regular rhythm. No murmur Respiratory: Normal respiratory effort without tachypnea nor retractions. Breath sounds are clear  Gastrointestinal: Soft and nontender. No distention.   Musculoskeletal: Nontender with normal range of motion in all extremities.  Neurologic:  Normal speech and language. No gross focal neurologic deficits Skin:  Skin is warm, dry and intact.  Psychiatric: Mood and affect are normal.   ____________________________________________    EKG  EKG viewed and interpreted by myself shows a normal sinus rhythm at 73 bpm with a narrow QRS, normal axis, normal intervals, no concerning ST changes.  Normal EKG.  ____________________________________________    RADIOLOGY  Chest x-ray shows no acute abnormality  ____________________________________________   INITIAL IMPRESSION / ASSESSMENT AND PLAN / ED COURSE  Pertinent labs & imaging results that were available during  my care of the patient were reviewed by me and considered in my medical decision making (see chart for details).   Patient presents to the emergency department for a burning sensation in her chest that started shortly after eating spicy sausage this morning.  Differential would include heartburn/reflux, gastritis, ACS.  We will check labs, chest x-ray and continue to closely monitor. Patient's labs are largely within normal limits including a negative troponin.  Reassuring EKG.  Chest x-ray is pending.  We will repeat a troponin as a precaution and treat with a GI cocktail.  Patient agreeable to plan of care.  Repeat troponin is negative.  Patient states considerable improvement after GI cocktail.  I discussed with the patient using over-the-counter Maalox after each meal and before bed for the  next 1 week.  Patient is to follow-up with her doctor.  Provided my normal chest pain return precautions.  Michele [name] was evaluated in Emergency Department on 09/10/2018 for the symptoms described in the history of present illness. She was evaluated in the context of the global COVID-19 pandemic, which necessitated consideration that the patient might be at risk for infection with the SARS-CoV-2 virus that causes COVID-19. Institutional protocols and algorithms that pertain to the evaluation of patients at risk for COVID-19 are in a state of rapid change based on information released by regulatory bodies including the CDC and federal and state organizations. These policies and algorithms were followed during the patient's care in the ED.  ____________________________________________   FINAL CLINICAL IMPRESSION(S) / ED DIAGNOSES  Chest pain   Minna AntisPaduchowski, Zeba Luby, MD 09/10/18 [phone]

## 2018-10-25 ENCOUNTER — Other Ambulatory Visit: Payer: Self-pay

## 2018-10-25 ENCOUNTER — Emergency Department
Admission: EM | Admit: 2018-10-25 | Discharge: 2018-10-25 | Disposition: A | Discharge status: Home / Self Care | Payer: Self-pay | Attending: Student in an Organized Health Care Education/Training Program | Admitting: Student in an Organized Health Care Education/Training Program

## 2018-10-25 DIAGNOSIS — F17228 Nicotine dependence, chewing tobacco, with other nicotine-induced disorders: Secondary | ICD-10-CM | POA: Insufficient documentation

## 2018-10-25 DIAGNOSIS — Z79899 Other long term (current) drug therapy: Secondary | ICD-10-CM | POA: Insufficient documentation

## 2018-10-25 DIAGNOSIS — I10 Essential (primary) hypertension: Secondary | ICD-10-CM | POA: Insufficient documentation

## 2018-10-25 DIAGNOSIS — E119 Type 2 diabetes mellitus without complications: Secondary | ICD-10-CM | POA: Insufficient documentation

## 2018-10-25 DIAGNOSIS — M5431 Sciatica, right side: Secondary | ICD-10-CM

## 2018-10-25 DIAGNOSIS — Z794 Long term (current) use of insulin: Secondary | ICD-10-CM | POA: Insufficient documentation

## 2018-10-25 MED ORDER — ORPHENADRINE CITRATE 30 MG/ML IJ SOLN
60.0000 mg | Freq: Two times a day (BID) | INTRAMUSCULAR | Status: DC
  Administered 2018-10-25: 60 mg via INTRAMUSCULAR
  Filled 2018-10-25 (×2): qty 2

## 2018-10-25 MED ORDER — HYDROCODONE-ACETAMINOPHEN 5-325 MG PO TABS
1.0000 | ORAL_TABLET | Freq: Once | ORAL | Status: AC
  Administered 2018-10-25: 1 via ORAL
  Filled 2018-10-25: qty 1

## 2018-10-25 MED ORDER — KETOROLAC TROMETHAMINE 30 MG/ML IJ SOLN
30.0000 mg | Freq: Once | INTRAMUSCULAR | Status: AC
  Administered 2018-10-25: 30 mg via INTRAMUSCULAR
  Filled 2018-10-25: qty 1

## 2018-10-25 MED ORDER — MELOXICAM 15 MG PO TABS: 15.0000 mg | ORAL_TABLET | Freq: Every day | ORAL | 0 refills | Status: DC

## 2018-10-25 MED ORDER — CYCLOBENZAPRINE HCL 10 MG PO TABS: 10.0000 mg | ORAL_TABLET | Freq: Three times a day (TID) | ORAL | 0 refills | Status: DC | PRN

## 2018-10-25 MED ORDER — OXYCODONE-ACETAMINOPHEN 10-325 MG PO TABS: 1.0000 | ORAL_TABLET | Freq: Four times a day (QID) | ORAL | 0 refills | Status: DC | PRN

## 2018-10-25 MED ORDER — OXYCODONE HCL 5 MG PO TABS
10.0000 mg | ORAL_TABLET | Freq: Once | ORAL | Status: AC
  Administered 2018-10-25: 10 mg via ORAL
  Filled 2018-10-25: qty 2

## 2018-10-25 MED ORDER — DEXAMETHASONE SODIUM PHOSPHATE 10 MG/ML IJ SOLN
10.0000 mg | Freq: Once | INTRAMUSCULAR | Status: AC
  Administered 2018-10-25: 10 mg via INTRAMUSCULAR
  Filled 2018-10-25: qty 1

## 2018-10-25 NOTE — ED Notes (Signed)
Patient states she has someone who will transport her home.

## 2018-10-25 NOTE — ED Provider Notes (Signed)
[name] Emergency Department Provider Note ____________________________________________  Time seen: Approximately 7:36 PM  I have reviewed the triage vital signs and the nursing notes.  HISTORY  Chief Complaint Back Pain   HPI [name] is a 53 y.o. female presents to the emergency department for treatment and evaluation of right-sided back pain.  She states that the pain started 2 days ago.  No specific point of onset or known injury.  Pain starts kind of in the middle of her back and radiates around her hip down into her buttock and down the leg.  She states that it is also causing her calf to cramp.  No relief with Tylenol, ibuprofen, or pain patch.  Past Medical History:  Diagnosis Date  . Diabetes mellitus without complication (HCC)   . Hypertension   . Migraines     Patient Active Problem List   Diagnosis Date Noted  . Lumbar facet joint syndrome 03/21/2018  . Chronic bilateral low back pain without sciatica 03/21/2018  . Chronic pain syndrome 03/21/2018  . Obesity, morbid, BMI 40.0-49.9 (HCC) 07/12/2017  . Bilateral occipital neuralgia 09/14/2016  . Neck pain 12/16/2014  . Migraines 04/28/2014  . Diabetes mellitus type 2, uncomplicated (HCC) 04/28/2014    Past Surgical History:  Procedure Laterality Date  . ABDOMINAL HYSTERECTOMY    . ANKLE SURGERY    . CESAREAN SECTION    . KNEE SURGERY    . WRIST SURGERY      Prior to Admission medications   Medication Sig Start Date End Date Taking? Authorizing Provider  albuterol (PROVENTIL HFA;VENTOLIN HFA) 108 (90 BASE) MCG/ACT inhaler Inhale 2 puffs into the lungs every 6 (six) hours as needed for wheezing or shortness of breath. 08/26/14   Schaevitz, Myra Rudeavid Matthew, MD  amitriptyline (ELAVIL) 75 MG tablet Take 1 tablet (75 mg total) by mouth at bedtime. 03/20/18   Edward JollyLateef, Bilal, MD  atorvastatin (LIPITOR) 20 MG tablet Take 20 mg by mouth daily at 6 PM.    [provider]   cyclobenzaprine (FLEXERIL) 10 MG tablet Take 1 tablet (10 mg total) by mouth 3 (three) times daily as needed. 10/25/18   Hisao Doo B, FNP  glipiZIDE (GLUCOTROL) 5 MG tablet Take 5 mg by mouth daily before breakfast.    [provider]  insulin glargine (LANTUS) 100 UNIT/ML injection Inject 14 Units into the skin at bedtime.    [provider]  lidocaine (LIDODERM) 5 % Place 1 patch onto the skin daily. Remove & Discard patch within 12 hours or as directed by MD    [provider]  meloxicam (MOBIC) 15 MG tablet Take 1 tablet (15 mg total) by mouth daily. 10/25/18   Akanksha Bellmore, Rulon Eisenmengerari B, FNP  metFORMIN (GLUCOPHAGE) 1000 MG tablet Take 1 tablet (1,000 mg total) by mouth 2 (two) times daily with a meal. Patient taking differently: Take 500 mg by mouth daily.  03/29/17 03/29/18  Emily FilbertWilliams, Jonathan E, MD  oxyCODONE-acetaminophen (PERCOCET) 10-325 MG tablet Take 1 tablet by mouth every 6 (six) hours as needed for pain. 10/25/18 10/25/19  Harutyun Monteverde, Rulon Eisenmengerari B, FNP  pregabalin (LYRICA) 100 MG capsule Take 1 capsule (100 mg total) by mouth 3 (three) times daily. 03/20/18   Edward JollyLateef, Bilal, MD  SUMAtriptan-naproxen (TREXIMET) 85-500 MG per tablet Take 1 tablet by mouth every 2 (two) hours as needed for migraine.    [provider]  topiramate (TOPAMAX) 100 MG tablet Take 100 mg by mouth 2 (two) times daily.  [provider]  triamterene-hydrochlorothiazide (MAXZIDE-25) 37.5-25 MG per tablet Take 1 tablet by mouth daily.    [provider]  verapamil (COVERA HS) 180 MG (CO) 24 hr tablet Take 180 mg by mouth at bedtime.    [provider]    Allergies Patient has no known allergies.  Family History  Problem Relation Age of Onset  . Breast cancer Neg Hx     Social History Social History   Tobacco Use  . Smoking status: Never Smoker  . Smokeless tobacco: Current User    Types: Snuff  Substance Use Topics  . Alcohol use: Yes    Comment:  occasionally  . Drug use: No    Review of Systems Constitutional: Well appearing. Respiratory: Negative for dyspnea. Cardiovascular: Negative for change in skin temperature or color. Musculoskeletal:   Negative for chronic steroid use   Negative for trauma in the presence of osteoporosis  Negative for age over 3 and trauma.  Negative for constitutional symptoms, or history of cancer  Negative for pain worse at night. Skin: Negative for rash, lesion, or wound.  Genitourinary: Negative for urinary retention. Rectal: Negative for fecal incontinence or new onset constipation/bowel habit changes. Hematological/Immunilogical: Negative for immunosuppression, IV drug use, or fever Neurological: Positive for burning, tingling, numb, electric, radiating pain in the right lower extremity.                        Negative for saddle anesthesia.                        Negative for focal neurologic deficit, progressive or disabling symptoms             Negative for saddle anesthesia. ____________________________________________   PHYSICAL EXAM:  VITAL SIGNS: ED Triage Vitals  Enc Vitals Group     BP 10/25/18 1824 (!) 149/84     Pulse Rate 10/25/18 1824 81     Resp 10/25/18 1824 20     Temp 10/25/18 1824 99.3 F (37.4 C)     Temp Source 10/25/18 1824 Oral     SpO2 10/25/18 1824 100 %     Weight 10/25/18 1824 260 lb (117.9 kg)     Height 10/25/18 1824 5\' 6"  (1.676 m)     Head Circumference --      Peak Flow --      Pain Score 10/25/18 1831 10     Pain Loc --      Pain Edu? --      Excl. in [place]? --     Constitutional: Alert and oriented. Well appearing and in no acute distress. Eyes: Conjunctivae are clear without discharge or drainage.  Head: Atraumatic. Neck: Full, active range of motion. Respiratory: Respirations even and unlabored. Musculoskeletal: Guarded ROM of the back and right lower extremity, Strength 5/5 of the lower extremities as tested. Neurologic: Reflexes of the  lower extremities are 2+. Positive straight leg raise on the right side. Skin: Atraumatic.  Psychiatric: Behavior and affect are normal.  ____________________________________________   LABS (all labs ordered are listed, but only abnormal results are displayed)  Labs Reviewed - No data to display ____________________________________________  RADIOLOGY  Not indicated. ____________________________________________   PROCEDURES  Procedure(s) performed:  Procedures ____________________________________________   INITIAL IMPRESSION / ASSESSMENT AND PLAN / ED COURSE  Michele [name] is a 53 y.o. female who presents to the emergency department for treatment and evaluation of right-sided back pain  that started 2 days ago.  See HPI for further details.  Patient states that she has had similar symptoms in the past and was diagnosed with sciatica.  Patient will be treated with Norflex, Toradol, and Norco.   Little pain relief after the above medications.  She will be given Roxicet IR 10 mg prior to discharge.  She will also be given injection of Decadron 10 mg.   Plan will be to discharge her home with a prescription for flexeril, oxycodone, and prednisone.  She will be instructed to follow-up with orthopedics for symptoms that are not improving over the next few days.  She will be advised to return to the emergency department for symptoms of change or worsen if unable to schedule appointment.  Medications  orphenadrine (NORFLEX) injection 60 mg (60 mg Intramuscular Given 10/25/18 1954)  dexamethasone (DECADRON) injection 10 mg (has no administration in time range)  oxyCODONE (Oxy IR/ROXICODONE) immediate release tablet 10 mg (has no administration in time range)  ketorolac (TORADOL) 30 MG/ML injection 30 mg (30 mg Intramuscular Given 10/25/18 1950)  HYDROcodone-acetaminophen (NORCO/VICODIN) 5-325 MG per tablet 1 tablet (1 tablet Oral Given 10/25/18 1948)    ED Discharge Orders          Ordered    cyclobenzaprine (FLEXERIL) 10 MG tablet  3 times daily PRN     10/25/18 2038    oxyCODONE-acetaminophen (PERCOCET) 10-325 MG tablet  Every 6 hours PRN     10/25/18 2038    meloxicam (MOBIC) 15 MG tablet  Daily     10/25/18 2038           Pertinent labs & imaging results that were available during my care of the patient were reviewed by me and considered in my medical decision making (see chart for details).  _________________________________________   FINAL CLINICAL IMPRESSION(S) / ED DIAGNOSES  Final diagnoses:  Sciatica of right side     If controlled substance prescribed during this visit, 12 month history viewed on the [facility] prior to issuing an initial prescription for Schedule II or III opiod.   Chinita Pesterriplett, Marcanthony Sleight B, FNP 10/25/18 2040    Willy Eddyobinson, Patrick, MD 10/25/18 2105

## 2018-10-25 NOTE — ED Triage Notes (Signed)
Pt presents via POV c/o right sided lower back pain extending to right leg for 2 days. Reports pain unrelieved at home with ice/heat.

## 2018-10-25 NOTE — ED Notes (Signed)
Pt verbalized understanding of discharge instructions. NAD at this time. 

## 2018-10-25 NOTE — Discharge Instructions (Signed)
Please follow-up with your primary care provider if not improving over the next 2 to 3 days.  Take your medication as prescribed.  Be advised that the oxycodone and Flexeril may cause excessive drowsiness.  Do not drive or work while taking these medications.  Return to the emergency department for symptoms of change or worsen if you are unable to schedule an appointment with primary care.

## 2018-11-20 ENCOUNTER — Other Ambulatory Visit: Payer: Self-pay

## 2018-11-20 ENCOUNTER — Encounter: Payer: Self-pay | Admitting: Emergency Medicine

## 2018-11-20 ENCOUNTER — Emergency Department: Payer: Self-pay

## 2018-11-20 ENCOUNTER — Emergency Department
Admission: EM | Admit: 2018-11-20 | Discharge: 2018-11-20 | Disposition: A | Discharge status: Home / Self Care | Payer: Self-pay | Attending: Student | Admitting: Student

## 2018-11-20 DIAGNOSIS — M545 Low back pain: Secondary | ICD-10-CM | POA: Insufficient documentation

## 2018-11-20 DIAGNOSIS — Z79899 Other long term (current) drug therapy: Secondary | ICD-10-CM | POA: Insufficient documentation

## 2018-11-20 DIAGNOSIS — F17220 Nicotine dependence, chewing tobacco, uncomplicated: Secondary | ICD-10-CM | POA: Insufficient documentation

## 2018-11-20 DIAGNOSIS — G8929 Other chronic pain: Secondary | ICD-10-CM | POA: Insufficient documentation

## 2018-11-20 DIAGNOSIS — Z794 Long term (current) use of insulin: Secondary | ICD-10-CM | POA: Insufficient documentation

## 2018-11-20 DIAGNOSIS — E119 Type 2 diabetes mellitus without complications: Secondary | ICD-10-CM | POA: Insufficient documentation

## 2018-11-20 DIAGNOSIS — I10 Essential (primary) hypertension: Secondary | ICD-10-CM | POA: Insufficient documentation

## 2018-11-20 MED ORDER — ONDANSETRON HCL 4 MG/2ML IJ SOLN
4.0000 mg | Freq: Once | INTRAMUSCULAR | Status: AC
  Administered 2018-11-20: 4 mg via INTRAVENOUS
  Filled 2018-11-20: qty 2

## 2018-11-20 MED ORDER — MELOXICAM 15 MG PO TABS: 15.0000 mg | ORAL_TABLET | Freq: Every day | ORAL | 2 refills | Status: DC

## 2018-11-20 MED ORDER — HYDROCODONE-ACETAMINOPHEN 5-325 MG PO TABS: 1.0000 | ORAL_TABLET | Freq: Four times a day (QID) | ORAL | 0 refills | Status: DC | PRN

## 2018-11-20 MED ORDER — MORPHINE SULFATE (PF) 4 MG/ML IV SOLN
4.0000 mg | Freq: Once | INTRAVENOUS | Status: AC
  Administered 2018-11-20: 20:00:00 4 mg via INTRAVENOUS
  Filled 2018-11-20: qty 1

## 2018-11-20 MED ORDER — BACLOFEN 10 MG PO TABS: 10.0000 mg | ORAL_TABLET | Freq: Three times a day (TID) | ORAL | 1 refills | Status: DC

## 2018-11-20 MED ORDER — ORPHENADRINE CITRATE 30 MG/ML IJ SOLN
60.0000 mg | Freq: Once | INTRAMUSCULAR | Status: AC
  Administered 2018-11-20: 60 mg via INTRAVENOUS
  Filled 2018-11-20: qty 2

## 2018-11-20 MED ORDER — MORPHINE SULFATE (PF) 4 MG/ML IV SOLN
4.0000 mg | Freq: Once | INTRAVENOUS | Status: AC
  Administered 2018-11-20: 18:00:00 4 mg via INTRAVENOUS
  Filled 2018-11-20: qty 1

## 2018-11-20 NOTE — Discharge Instructions (Signed)
Follow-up with your regular doctor and the pain clinic doctor.  Please call him and let them know that you had a MRI done of your back.  Take your medications as prescribed.  Apply ice to the lower back.  Return if worsening.

## 2018-11-20 NOTE — ED Notes (Signed)
Pt with hx of back procedures per pt. Pt states right hip and back pain x 2 days, goes down right leg. No swelling noted.

## 2018-11-20 NOTE — ED Provider Notes (Signed)
[facility] Emergency Department Provider Note  ____________________________________________   First MD Initiated Contact with [name] 11/20/18 1704     (approximate)  I have reviewed the triage vital signs and the nursing notes.   HISTORY  Chief Complaint Back Pain    HPI Michele [name] is a 53 y.o. female presents emergency department with acute exasperation of chronic back pain.  [name] states that she has lower back pain that radiates to the legs mostly on the right.  States that the pain is increased over the last 2 days it has been difficult for her to stand or walk.  She states she used to see a back doctor but did not have insurance so she now has insurance and has an appointment with him on October 1 for a virtual conference and then will have an additional appointment later.  She states they had inserted pellets in her back but she did not finish all of the procedures.  She denies any loss of bowel or bladder control.    Past Medical History:  Diagnosis Date   Diabetes mellitus without complication (HCC)    Hypertension    Migraines     [name] Active Problem List   Diagnosis Date Noted   Lumbar facet joint syndrome 03/21/2018   Chronic bilateral low back pain without sciatica 03/21/2018   Chronic pain syndrome 03/21/2018   Obesity, morbid, BMI 40.0-49.9 (HCC) 07/12/2017   Bilateral occipital neuralgia 09/14/2016   Neck pain 12/16/2014   Migraines 04/28/2014   Diabetes mellitus type 2, uncomplicated (HCC) 04/28/2014    Past Surgical History:  Procedure Laterality Date   ABDOMINAL HYSTERECTOMY     ANKLE SURGERY     CESAREAN SECTION     KNEE SURGERY     WRIST SURGERY      Prior to Admission medications   Medication Sig Start Date End Date Taking? Authorizing Provider  albuterol (PROVENTIL HFA;VENTOLIN HFA) 108 (90 BASE) MCG/ACT inhaler Inhale 2 puffs into the lungs every 6 (six) hours as needed for wheezing or  shortness of breath. 08/26/14   Schaevitz, Myra Rude, MD  amitriptyline (ELAVIL) 75 MG tablet Take 1 tablet (75 mg total) by mouth at bedtime. 03/20/18   Edward Jolly, MD  atorvastatin (LIPITOR) 20 MG tablet Take 20 mg by mouth daily at 6 PM.    [provider]  baclofen (LIORESAL) 10 MG tablet Take 1 tablet (10 mg total) by mouth 3 (three) times daily. 11/20/18 11/20/19  Deeandra Jerry, Roselyn Bering, PA-C  cyclobenzaprine (FLEXERIL) 10 MG tablet Take 1 tablet (10 mg total) by mouth 3 (three) times daily as needed. 10/25/18   Triplett, Cari B, FNP  glipiZIDE (GLUCOTROL) 5 MG tablet Take 5 mg by mouth daily before breakfast.    [provider]  HYDROcodone-acetaminophen (NORCO/VICODIN) 5-325 MG tablet Take 1 tablet by mouth every 6 (six) hours as needed for moderate pain. 11/20/18   Mecca Guitron, Roselyn Bering, PA-C  insulin glargine (LANTUS) 100 UNIT/ML injection Inject 14 Units into the skin at bedtime.    [provider]  lidocaine (LIDODERM) 5 % Place 1 patch onto the skin daily. Remove & Discard patch within 12 hours or as directed by MD    [provider]  meloxicam (MOBIC) 15 MG tablet Take 1 tablet (15 mg total) by mouth daily. 11/20/18 11/20/19  Kadeshia Kasparian, Roselyn Bering, PA-C  metFORMIN (GLUCOPHAGE) 1000 MG tablet Take 1 tablet (1,000 mg total) by mouth 2 (two) times daily with a meal.  [name] taking differently: Take 500 mg by mouth daily.  03/29/17 03/29/18  Emily FilbertWilliams, Jonathan E, MD  oxyCODONE-acetaminophen (PERCOCET) 10-325 MG tablet Take 1 tablet by mouth every 6 (six) hours as needed for pain. 10/25/18 10/25/19  Triplett, Rulon Eisenmengerari B, FNP  pregabalin (LYRICA) 100 MG capsule Take 1 capsule (100 mg total) by mouth 3 (three) times daily. 03/20/18   Edward JollyLateef, Bilal, MD  SUMAtriptan-naproxen (TREXIMET) 85-500 MG per tablet Take 1 tablet by mouth every 2 (two) hours as needed for migraine.    [provider]  topiramate (TOPAMAX) 100 MG tablet Take 100 mg by mouth 2 (two) times daily.    [provider]  triamterene-hydrochlorothiazide (MAXZIDE-25) 37.5-25 MG per tablet Take 1 tablet by mouth daily.    [provider]  verapamil (COVERA HS) 180 MG (CO) 24 hr tablet Take 180 mg by mouth at bedtime.    [provider]    Allergies [name] has no known allergies.  Family History  Problem Relation Age of Onset   Breast cancer Neg Hx     Social History Social History   Tobacco Use   Smoking status: Never Smoker   Smokeless tobacco: Current User    Types: Snuff  Substance Use Topics   Alcohol use: Yes    Comment: occasionally   Drug use: No    Review of Systems  Constitutional: No fever/chills Eyes: No visual changes. ENT: No sore throat. Respiratory: Denies cough Genitourinary: Negative for dysuria. Musculoskeletal: Positive for back pain. Skin: Negative for rash.    ____________________________________________   PHYSICAL EXAM:  VITAL SIGNS: ED Triage Vitals [11/20/18 1651]  Enc Vitals Group     BP (!) 137/92     Pulse Rate 83     Resp 16     Temp 98.7 F (37.1 C)     Temp Source Oral     SpO2      Weight 260 lb (117.9 kg)     Height 5\' 6"  (1.676 m)     Head Circumference      Peak Flow      Pain Score 10     Pain Loc      Pain Edu?      Excl. in GC?     Constitutional: Alert and oriented. Well appearing and in no acute distress. Eyes: Conjunctivae are normal.  Head: Atraumatic. Nose: No congestion/rhinnorhea. Mouth/Throat: Mucous membranes are moist.   Neck:  supple no lymphadenopathy noted Cardiovascular: Normal rate, regular rhythm. Heart sounds are normal Respiratory: Normal respiratory effort.  No retractions, lungs c t a  GU: deferred Musculoskeletal: [name] has difficulty moving from the wheelchair to the stretcher.  Difficulty raising her legs to change positions onto the stretcher.  Lumbar spine is tender.  5/5 strength in lower extremities.  Neurovascular is intact.   Neurologic:  Normal speech and  language.  Skin:  Skin is warm, dry and intact. No rash noted. Psychiatric: Mood and affect are normal. Speech and behavior are normal.  ____________________________________________   LABS (all labs ordered are listed, but only abnormal results are displayed)  Labs Reviewed - No data to display ____________________________________________   ____________________________________________  RADIOLOGY  MRI of the lumbar spine shows effusions in the lower back at the joint spaces, degenerative changes, no impingement or abscess  ____________________________________________   PROCEDURES  Procedure(s) performed: Saline lock, morphine 4 mg IV, Zofran 4 mg IV   Procedures    ____________________________________________   INITIAL IMPRESSION / ASSESSMENT AND PLAN /  ED COURSE  Pertinent labs & imaging results that were available during my care of the [name] were reviewed by me and considered in my medical decision making (see chart for details).   [name] is a 53 year old female presents emergency department acute exasperation of chronic back pain.  [name]'s been seen here multiple times for the same.  X-rays were performed and were negative for any acute changes.  Due to the [name]'s exasperating  symptoms we will consider ordering MRI.  Physical exam shows [name] has difficulty moving.  Lumbar spine is tender.  Neurovascular is intact.  My exam is unremarkable  Saline lock, morphine 4 mg IV, Zofran 4 mg IV MRI of the lumbar spine shows joint effusions, no abscess, +degenerative changes  [name] was given morphine 4 mg IV, Zofran 4 mg IV and after the MRI she was given additional morphine 4 mg IV and Norflex 60 mg IV.  Explained all findings to the [name].  She is to follow-up with her regular doctor or the pain clinic where she already has an appointment.  Explained to them that she received a MRI here in the emergency department.  She is apply ice to the lower back.  Take  medications as prescribed.  Return if worsening.  She is discharged stable condition.   Ivorie Uplinger was evaluated in Emergency Department on 11/20/2018 for the symptoms described in the history of present illness. She was evaluated in the context of the global COVID-19 pandemic, which necessitated consideration that the [name] might be at risk for infection with the SARS-CoV-2 virus that causes COVID-19. Institutional protocols and algorithms that pertain to the evaluation of patients at risk for COVID-19 are in a state of rapid change based on information released by regulatory bodies including the CDC and federal and state organizations. These policies and algorithms were followed during the [name]'s care in the ED.   As part of my medical decision making, I reviewed the following data within the [facility] notes reviewed and incorporated, Old chart reviewed, Radiograph reviewed see above, Notes from prior ED visits and [employer] Controlled Substance Database  ____________________________________________   FINAL CLINICAL IMPRESSION(S) / ED DIAGNOSES  Final diagnoses:  Acute exacerbation of chronic low back pain      NEW MEDICATIONS STARTED DURING THIS VISIT:  New Prescriptions   BACLOFEN (LIORESAL) 10 MG TABLET    Take 1 tablet (10 mg total) by mouth 3 (three) times daily.   HYDROCODONE-ACETAMINOPHEN (NORCO/VICODIN) 5-325 MG TABLET    Take 1 tablet by mouth every 6 (six) hours as needed for moderate pain.   MELOXICAM (MOBIC) 15 MG TABLET    Take 1 tablet (15 mg total) by mouth daily.     Note:  This document was prepared using Dragon voice recognition software and may include unintentional dictation errors.    Versie Starks, PA-C 11/20/18 2038    Lilia Pro., MD 11/21/18 [phone]

## 2018-11-20 NOTE — ED Triage Notes (Signed)
Patient presents to the ED with right hip pain radiating down right leg.  Patient states she has had pain x 2 days.  Patient reports history of sciatica and states this feels similar.  Patient reports she has a doctor's appointment with her "back doctor", October 21st.  Patient is walking with a slight limp.   Patient states she is also dealing with a headache that she states feels like a migraine.  Patient reports history of migraine headaches.  Patient states, "I took my medicine but it doesn't seem like it's helping this time.

## 2018-11-24 ENCOUNTER — Telehealth: Payer: Self-pay | Admitting: *Deleted

## 2018-11-25 NOTE — Telephone Encounter (Signed)
Attempted to call patient, mail box is full, unable to leave a message.

## 2018-11-26 NOTE — Telephone Encounter (Signed)
Attempted to call patient, mail box is full, unable to leave a message.

## 2018-11-26 NOTE — Telephone Encounter (Signed)
Patient returned call to [facility]

## 2018-12-02 ENCOUNTER — Other Ambulatory Visit: Payer: Self-pay

## 2018-12-02 ENCOUNTER — Emergency Department
Admission: EM | Admit: 2018-12-02 | Discharge: 2018-12-02 | Disposition: A | Discharge status: Home / Self Care | Payer: Self-pay | Attending: Student in an Organized Health Care Education/Training Program | Admitting: Student in an Organized Health Care Education/Training Program

## 2018-12-02 DIAGNOSIS — F17290 Nicotine dependence, other tobacco product, uncomplicated: Secondary | ICD-10-CM | POA: Insufficient documentation

## 2018-12-02 DIAGNOSIS — I10 Essential (primary) hypertension: Secondary | ICD-10-CM | POA: Insufficient documentation

## 2018-12-02 DIAGNOSIS — Z7984 Long term (current) use of oral hypoglycemic drugs: Secondary | ICD-10-CM | POA: Insufficient documentation

## 2018-12-02 DIAGNOSIS — E1165 Type 2 diabetes mellitus with hyperglycemia: Secondary | ICD-10-CM | POA: Insufficient documentation

## 2018-12-02 DIAGNOSIS — Z79899 Other long term (current) drug therapy: Secondary | ICD-10-CM | POA: Insufficient documentation

## 2018-12-02 DIAGNOSIS — R739 Hyperglycemia, unspecified: Secondary | ICD-10-CM

## 2018-12-02 LAB — BASIC METABOLIC PANEL
Anion gap: 10 (ref 5–15)
BUN: 16 mg/dL (ref 6–20)
CO2: 24 mmol/L (ref 22–32)
Calcium: 9.4 mg/dL (ref 8.9–10.3)
Chloride: 103 mmol/L (ref 98–111)
Creatinine, Ser: 0.98 mg/dL (ref 0.44–1.00)
GFR calc Af Amer: >60 mL/min (ref >60)
GFR calc non Af Amer: >60 mL/min (ref >60)
Glucose, Bld: 292 mg/dL — ABNORMAL HIGH (ref 70–99)
Potassium: 3.2 mmol/L — ABNORMAL LOW (ref 3.5–5.1)
Sodium: 137 mmol/L (ref 135–145)

## 2018-12-02 LAB — URINALYSIS, COMPLETE (UACMP) WITH MICROSCOPIC
Bacteria, UA: NONE SEEN
Bilirubin Urine: NEGATIVE
Glucose, UA: >=500 mg/dL — AB
Hgb urine dipstick: NEGATIVE
Ketones, ur: NEGATIVE mg/dL
Leukocytes,Ua: NEGATIVE
Nitrite: NEGATIVE
Protein, ur: NEGATIVE mg/dL
Specific Gravity, Urine: 1.01 (ref 1.005–1.030)
WBC, UA: NONE SEEN WBC/hpf (ref 0–5)
pH: 7 (ref 5.0–8.0)

## 2018-12-02 LAB — CBC
HCT: 39.7 % (ref 36.0–46.0)
Hemoglobin: 12.8 g/dL (ref 12.0–15.0)
MCH: 26.1 pg (ref 26.0–34.0)
MCHC: 32.2 g/dL (ref 30.0–36.0)
MCV: 81 fL (ref 80.0–100.0)
Platelets: 219 10*3/uL (ref 150–400)
RBC: 4.9 MIL/uL (ref 3.87–5.11)
RDW: 12.8 % (ref 11.5–15.5)
WBC: 6.2 10*3/uL (ref 4.0–10.5)
nRBC: 0 % (ref 0.0–0.2)

## 2018-12-02 LAB — GLUCOSE, CAPILLARY: Glucose-Capillary: 278 mg/dL — ABNORMAL HIGH (ref 70–99)

## 2018-12-02 MED ORDER — ACETAMINOPHEN 500 MG PO TABS
1000.0000 mg | ORAL_TABLET | Freq: Once | ORAL | Status: AC
  Administered 2018-12-02: 1000 mg via ORAL
  Filled 2018-12-02: qty 2

## 2018-12-02 MED ORDER — PROCHLORPERAZINE MALEATE 10 MG PO TABS
10.0000 mg | ORAL_TABLET | Freq: Once | ORAL | Status: AC
  Administered 2018-12-02: 10 mg via ORAL
  Filled 2018-12-02: qty 1

## 2018-12-02 NOTE — ED Triage Notes (Signed)
Pt states "my sugar has been elevated" over a week running 300-500's states she has been taking her PO and SQ insulin routinely. Pt c/o feeling lightheaded with blurred vision.

## 2018-12-02 NOTE — ED Notes (Signed)
Called Pharmacy and waiting on medication to be sent

## 2018-12-02 NOTE — ED Provider Notes (Signed)
[facility] Emergency Department Provider Note    First MD Initiated Contact with Patient 12/02/18 1315     (approximate)  I have reviewed the triage vital signs and the nursing notes.   HISTORY  Chief Complaint Hyperglycemia    HPI Michele [name] is a 53 y.o. female with the below listed past medical history presents the ER for concern of her elevated blood sugars.  And states that she also has a mild headache consistent with her previous migraines.  States that when her blood sugar gets elevated she starts having blurry vision.  States that she was having trouble sleeping last night and feels fatigued today and was unable to go to work.  Has not made any medication changes and has been compliant with her medications.  Denies any diet changes.  Is not the worst headache of her life.  Denies any fevers.  No nausea or vomiting.  No shortness of breath or chest pain.  Has not had any vision loss.  No eye pain.   Past Medical History:  Diagnosis Date  . Diabetes mellitus without complication (HCC)   . Hypertension   . Migraines    Family History  Problem Relation Age of Onset  . Breast cancer Neg Hx    Past Surgical History:  Procedure Laterality Date  . ABDOMINAL HYSTERECTOMY    . ANKLE SURGERY    . CESAREAN SECTION    . KNEE SURGERY    . WRIST SURGERY     Patient Active Problem List   Diagnosis Date Noted  . Lumbar facet joint syndrome 03/21/2018  . Chronic bilateral low back pain without sciatica 03/21/2018  . Chronic pain syndrome 03/21/2018  . Obesity, morbid, BMI 40.0-49.9 (HCC) 07/12/2017  . Bilateral occipital neuralgia 09/14/2016  . Neck pain 12/16/2014  . Migraines 04/28/2014  . Diabetes mellitus type 2, uncomplicated (HCC) 04/28/2014      Prior to Admission medications   Medication Sig Start Date End Date Taking? Authorizing Provider  albuterol (PROVENTIL HFA;VENTOLIN HFA) 108 (90 BASE) MCG/ACT inhaler Inhale 2 puffs into the  lungs every 6 (six) hours as needed for wheezing or shortness of breath. 08/26/14   Schaevitz, Myra Rude, MD  amitriptyline (ELAVIL) 75 MG tablet Take 1 tablet (75 mg total) by mouth at bedtime. 03/20/18   Edward Jolly, MD  atorvastatin (LIPITOR) 20 MG tablet Take 20 mg by mouth daily at 6 PM.    [provider]  baclofen (LIORESAL) 10 MG tablet Take 1 tablet (10 mg total) by mouth 3 (three) times daily. 11/20/18 11/20/19  Fisher, Roselyn Bering, PA-C  cyclobenzaprine (FLEXERIL) 10 MG tablet Take 1 tablet (10 mg total) by mouth 3 (three) times daily as needed. 10/25/18   Triplett, Cari B, FNP  glipiZIDE (GLUCOTROL) 5 MG tablet Take 5 mg by mouth daily before breakfast.    [provider]  HYDROcodone-acetaminophen (NORCO/VICODIN) 5-325 MG tablet Take 1 tablet by mouth every 6 (six) hours as needed for moderate pain. 11/20/18   Fisher, Roselyn Bering, PA-C  insulin glargine (LANTUS) 100 UNIT/ML injection Inject 14 Units into the skin at bedtime.    [provider]  lidocaine (LIDODERM) 5 % Place 1 patch onto the skin daily. Remove & Discard patch within 12 hours or as directed by MD    [provider]  meloxicam (MOBIC) 15 MG tablet Take 1 tablet (15 mg total) by mouth daily. 11/20/18 11/20/19  Fisher, Roselyn Bering, PA-C  metFORMIN (GLUCOPHAGE) 1000  MG tablet Take 1 tablet (1,000 mg total) by mouth 2 (two) times daily with a meal. Patient taking differently: Take 500 mg by mouth daily.  03/29/17 03/29/18  Emily Filbert, MD  oxyCODONE-acetaminophen (PERCOCET) 10-325 MG tablet Take 1 tablet by mouth every 6 (six) hours as needed for pain. 10/25/18 10/25/19  Triplett, Rulon Eisenmenger B, FNP  pregabalin (LYRICA) 100 MG capsule Take 1 capsule (100 mg total) by mouth 3 (three) times daily. 03/20/18   Edward Jolly, MD  SUMAtriptan-naproxen (TREXIMET) 85-500 MG per tablet Take 1 tablet by mouth every 2 (two) hours as needed for migraine.    [provider]  topiramate (TOPAMAX) 100 MG tablet  Take 100 mg by mouth 2 (two) times daily.    [provider]  triamterene-hydrochlorothiazide (MAXZIDE-25) 37.5-25 MG per tablet Take 1 tablet by mouth daily.    [provider]  verapamil (COVERA HS) 180 MG (CO) 24 hr tablet Take 180 mg by mouth at bedtime.    [provider]    Allergies Patient has no known allergies.    Social History Social History   Tobacco Use  . Smoking status: Never Smoker  . Smokeless tobacco: Current User    Types: Snuff  Substance Use Topics  . Alcohol use: Yes    Comment: occasionally  . Drug use: No    Review of Systems Patient denies headaches, rhinorrhea, blurry vision, numbness, shortness of breath, chest pain, edema, cough, abdominal pain, nausea, vomiting, diarrhea, dysuria, fevers, rashes or hallucinations unless otherwise stated above in HPI. ____________________________________________   PHYSICAL EXAM:  VITAL SIGNS: Vitals:   12/02/18 1106  BP: (!) 139/99  Pulse: 78  Resp: 16  Temp: 97.9 F (36.6 C)  SpO2: 100%    Constitutional: Alert and oriented.  Eyes: Conjunctivae are normal. PERRLA. No hyphema or hypopion Head: Atraumatic. Nose: No congestion/rhinnorhea. Mouth/Throat: Mucous membranes are moist.   Neck: No stridor. Painless ROM.  Cardiovascular: Normal rate, regular rhythm. Grossly normal heart sounds.  Good peripheral circulation. Respiratory: Normal respiratory effort.  No retractions. Lungs CTAB. Gastrointestinal: Soft and nontender. No distention. No abdominal bruits. No CVA tenderness. Genitourinary:  Musculoskeletal: No lower extremity tenderness nor edema.  No joint effusions. Neurologic:  Normal speech and language. No gross focal neurologic deficits are appreciated. No facial droop Skin:  Skin is warm, dry and intact. No rash noted. Psychiatric: Mood and affect are normal. Speech and behavior are normal.  ____________________________________________   LABS (all labs ordered are  listed, but only abnormal results are displayed)  Results for orders placed or performed during the hospital encounter of 12/02/18 (from the past 24 hour(s))  Basic metabolic panel     Status: Abnormal   Collection Time: 12/02/18 11:11 AM  Result Value Ref Range   Sodium 137 135 - 145 mmol/L   Potassium 3.2 (L) 3.5 - 5.1 mmol/L   Chloride 103 98 - 111 mmol/L   CO2 24 22 - 32 mmol/L   Glucose, Bld 292 (H) 70 - 99 mg/dL   BUN 16 6 - 20 mg/dL   Creatinine, Ser 9.52 0.44 - 1.00 mg/dL   Calcium 9.4 8.9 - 84.1 mg/dL   GFR calc non Af Amer >60 >60 mL/min   GFR calc Af Amer >60 >60 mL/min   Anion gap 10 5 - 15  CBC     Status: None   Collection Time: 12/02/18 11:11 AM  Result Value Ref Range   WBC 6.2 4.0 - 10.5 K/uL  RBC 4.90 3.87 - 5.11 MIL/uL   Hemoglobin 12.8 12.0 - 15.0 g/dL   HCT 39.7 36.0 - 46.0 %   MCV 81.0 80.0 - 100.0 fL   MCH 26.1 26.0 - 34.0 pg   MCHC 32.2 30.0 - 36.0 g/dL   RDW 12.8 11.5 - 15.5 %   Platelets 219 150 - 400 K/uL   nRBC 0.0 0.0 - 0.2 %  Urinalysis, Complete w Microscopic     Status: Abnormal   Collection Time: 12/02/18 11:11 AM  Result Value Ref Range   Color, Urine YELLOW (A) YELLOW   APPearance HAZY (A) CLEAR   Specific Gravity, Urine 1.010 1.005 - 1.030   pH 7.0 5.0 - 8.0   Glucose, UA >=500 (A) NEGATIVE mg/dL   Hgb urine dipstick NEGATIVE NEGATIVE   Bilirubin Urine NEGATIVE NEGATIVE   Ketones, ur NEGATIVE NEGATIVE mg/dL   Protein, ur NEGATIVE NEGATIVE mg/dL   Nitrite NEGATIVE NEGATIVE   Leukocytes,Ua NEGATIVE NEGATIVE   RBC / HPF 0-5 0 - 5 RBC/hpf   WBC, UA NONE SEEN 0 - 5 WBC/hpf   Bacteria, UA NONE SEEN NONE SEEN   Squamous Epithelial / LPF 6-10 0 - 5  Glucose, capillary     Status: Abnormal   Collection Time: 12/02/18 11:12 AM  Result Value Ref Range   Glucose-Capillary 278 (H) 70 - 99 mg/dL   ____________________________________________  EKG My review and personal interpretation at Time: 11:18   Indication: dm  Rate: 75  Rhythm:  sinus Axis: normal Other: normal intervals, no stemi ____________________________________________  RADIOLOGY   ____________________________________________   PROCEDURES  Procedure(s) performed:  Procedures    Critical Care performed: no ____________________________________________   INITIAL IMPRESSION / ASSESSMENT AND PLAN / ED COURSE  Pertinent labs & imaging results that were available during my care of the patient were reviewed by me and considered in my medical decision making (see chart for details).   DDX: Hyperglycemia, DKA, HHS, dehydration, electrolyte abnormality, migraine, tension  Michele [name] is a 53 y.o. who presents to the ED with symptoms as described above.  Patient well-appearing afebrile hemodynamically stable in no acute distress.  She is hyperglycemic but is chronically so.  No evidence of DKA or HHS.  Has mild headache will give Tylenol and Compazine for this.  She is tolerating p.o.  Her blood work is otherwise reassuring neuro exam is reassuring as well.  Do not feel that advanced imaging clinically indicated.  I believe she stable and appropriate for outpatient follow-up.     The patient was evaluated in Emergency Department today for the symptoms described in the history of present illness. He/she was evaluated in the context of the global COVID-19 pandemic, which necessitated consideration that the patient might be at risk for infection with the SARS-CoV-2 virus that causes COVID-19. Institutional protocols and algorithms that pertain to the evaluation of patients at risk for COVID-19 are in a state of rapid change based on information released by regulatory bodies including the CDC and federal and state organizations. These policies and algorithms were followed during the patient's care in the ED.  As part of my medical decision making, I reviewed the following data within the [facility] notes reviewed and incorporated, Labs  reviewed, notes from prior ED visits and [place] Controlled Substance Database   ____________________________________________   FINAL CLINICAL IMPRESSION(S) / ED DIAGNOSES  Final diagnoses:  Hyperglycemia      NEW MEDICATIONS STARTED DURING THIS VISIT:  New Prescriptions  No medications on file     Note:  This document was prepared using Dragon voice recognition software and may include unintentional dictation errors.    Willy Eddyobinson, Evlyn Amason, MD 12/02/18 1325

## 2018-12-02 NOTE — Discharge Instructions (Addendum)
Please follow up with PCP regarding management of your diabetes.  Please follow up with eye center.

## 2018-12-04 ENCOUNTER — Other Ambulatory Visit: Payer: Self-pay

## 2018-12-04 ENCOUNTER — Ambulatory Visit
Payer: Self-pay | Attending: Student in an Organized Health Care Education/Training Program | Admitting: Student in an Organized Health Care Education/Training Program

## 2018-12-04 DIAGNOSIS — G894 Chronic pain syndrome: Secondary | ICD-10-CM

## 2018-12-04 DIAGNOSIS — M47816 Spondylosis without myelopathy or radiculopathy, lumbar region: Secondary | ICD-10-CM

## 2018-12-04 NOTE — Progress Notes (Signed)
Pain Management Virtual Encounter Note - Virtual Visit via Telephone Telehealth (real-time audio visits between healthcare provider and patient).   Patient's Phone No. & Preferred Pharmacy:  [phone] (home); [phone] (mobile); (Preferred) [phone] No e-mail address on record  [facility] [address] - [place] - [address] [place] [address] Phone: [phone] Fax: [phone]  [facility] [address] - [place], [place] - [address] [place] [address] Phone: [phone] Fax: [phone]    Pre-screening note:  Our staff contacted Michele [name] and offered her an "in person", "face-to-face" appointment versus a telephone encounter. She indicated preferring the telephone encounter, at this time.   Reason for Virtual Visit: COVID-19*  Social distancing based on CDC and AMA recommendations.   I contacted Michele [name] on 12/04/2018 via telephone.      I clearly identified myself as Edward JollyBilal Tonye Tancredi, MD. I verified that I was speaking with the correct person using two identifiers (Name: Michele [name], and date of birth: 28-Oct-1965).  Advanced Informed Consent I sought verbal advanced consent from Michele [name] for virtual visit interactions. I informed Michele [name] of possible security and privacy concerns, risks, and limitations associated with providing "not-in-person" medical evaluation and management services. I also informed Michele [name] of the availability of "in-person" appointments. Finally, I informed her that there would be a charge for the virtual visit and that she could be  personally, fully or partially, financially responsible for it. Michele [name] expressed understanding and agreed to proceed.   Historic Elements   Michele [name] is a 53 y.o. year old, female patient evaluated today after her last encounter by our practice on 11/24/2018. Michele [name]  has a past medical history of  Diabetes mellitus without complication (HCC), Hypertension, and Migraines. She also  has a past surgical history that includes Cesarean section; Knee surgery; Ankle surgery; Abdominal hysterectomy; and Wrist surgery. Michele [name] has a current medication list which includes the following prescription(s): cyclobenzaprine, glipizide, insulin glargine, lidocaine, meloxicam, metformin, sumatriptan-naproxen, topiramate, triamterene-hydrochlorothiazide, verapamil, amitriptyline, atorvastatin, hydrocodone-acetaminophen, and oxycodone-acetaminophen. She  reports that she has never smoked. Her smokeless tobacco use includes snuff. She reports current alcohol use. She reports that she does not use drugs. Michele [name] has No Known Allergies.   HPI  Today, she is being contacted for worsening of previously known (established) problem  Patient's last visit with me was 04/02/2018.  She is status post 2 sets of diagnostic lumbar facet medial branch nerve blocks bilaterally at L3, L4, L5, S1.  These were performed on 02/19/2018 and 04/02/2018.  These provided her with significant yet short-term pain relief for her axial low back pain.  Patient lost her insurance and we were unable to do a radiofrequency ablation after her second diagnostic facet medial branch nerve block.  She states that her insurance will be reinstated on October 18 and she would like to pursue lumbar radiofrequency ablation thereafter.  Risks and benefits of RFA reviewed and patient would like to proceed.  Of note patient has completed a recent MRI which was done on 11/20/2018 which continues to show significant facet arthrosis at L3-L4 and L4-L5 with mild left neuroforaminal stenosis at L4-L5.  UDS:  Summary  Date Value Ref Range Status  02/06/2018 FINAL  Final    Comment:    ==================================================================== TOXASSURE COMP DRUG ANALYSIS,UR ==================================================================== Test  Result       Flag       Units Drug Present   Topiramate                     PRESENT   Amitriptyline                  PRESENT   Nortriptyline                  PRESENT    Nortriptyline may be administered as a prescription drug; it is    also an expected metabolite of amitriptyline.   Acetaminophen                  PRESENT   Verapamil                      PRESENT ==================================================================== Test                      Result    Flag   Units      Ref Range   Creatinine              112              mg/dL      >=49 ==================================================================== Declared Medications:  Medication list was not provided. ==================================================================== For clinical consultation, please call [phone]. ====================================================================    Laboratory Chemistry Profile (12 mo)  Renal: 12/02/2018: BUN 16; Creatinine, Ser 0.98  Lab Results  Component Value Date   GFRAA >60 12/02/2018   GFRNONAA >60 12/02/2018   Hepatic: No results found for requested labs within last 8760 hours. Lab Results  Component Value Date   AST 70 (H) 04/03/2017   ALT 52 04/03/2017   Other: No results found for requested labs within last 8760 hours. Note: Above Lab results reviewed.  Imaging  Last 90 days:  Dg Chest 1 View  Result Date: 09/10/2018 CLINICAL DATA:  Chest pain EXAM: CHEST  1 VIEW COMPARISON:  Jul 28, 2018 FINDINGS: Heart size is mildly enlarged. There is no pneumothorax. No large pleural effusion. There may be mild vascular congestion. There is no acute osseous abnormality. IMPRESSION: Mild cardiac enlargement without evidence of an acute cardiopulmonary process. Electronically Signed   By: Katherine Mantle M.D.   On: 09/10/2018 02:19   Mr Lumbar Spine Wo Contrast  Result Date: 11/20/2018 CLINICAL DATA:  Low back, right hip, and leg pain  for 2 days. EXAM: MRI LUMBAR SPINE WITHOUT CONTRAST TECHNIQUE: Multiplanar, multisequence MR imaging of the lumbar spine was performed. No intravenous contrast was administered. COMPARISON:  Lumbar spine radiographs 12/16/2017 FINDINGS: Segmentation:  Standard. Alignment:  Normal. Vertebrae: No fracture or suspicious osseous lesion. Mild periarticular edema associated with the right L4-5 facet joint. Conus medullaris and cauda equina: Conus extends to the L1 level. Conus and cauda equina appear normal. Paraspinal and other soft tissues: Unremarkable. Disc levels: T12-L1 through L2-3: Negative. L3-4: Disc desiccation and mild disc space narrowing mild facet arthrosis without significant disc bulging, disc herniation, or stenosis. L4-5: Disc desiccation. Minimal disc bulging, endplate spurring, and moderate facet hypertrophy result in mild left neural foraminal stenosis without spinal stenosis. Bilateral facet joint effusions. L5-S1: Negative. IMPRESSION: 1. Facet arthrosis at L3-4 and L4-5, moderate at the latter where there are joint effusions and mild right-sided edema which could be a source of back pain. 2. Mild left neural foraminal stenosis at L4-5. 3. No spinal stenosis  or evidence of right-sided neural impingement. Electronically Signed   By: Logan Bores M.D.   On: 11/20/2018 19:11    Assessment  The primary encounter diagnosis was Lumbar facet joint syndrome. Diagnoses of Obesity, morbid, BMI 40.0-49.9 ([place]) and Chronic pain syndrome were also pertinent to this visit.  Plan of Care  I have discontinued Neilah A. Ord's albuterol, pregabalin, and baclofen. I am also having her maintain her SUMAtriptan-naproxen, triamterene-hydrochlorothiazide, verapamil, topiramate, metFORMIN, glipiZIDE, lidocaine, amitriptyline, atorvastatin, insulin glargine, cyclobenzaprine, oxyCODONE-acetaminophen, meloxicam, and HYDROcodone-acetaminophen.  Pharmacotherapy (Medications Ordered): No orders of the defined types  were placed in this encounter.  Orders:  Orders Placed This Encounter  Procedures  . Radiofrequency,Lumbar    Standing Status:   Future    Standing Expiration Date:   06/03/2020    Scheduling Instructions:     Side(s): RIGHT     Level: L3-4, L4-5, & L5-S1 Facets (L2, L3, L4, L5, & S1 Medial Branch Nerves)     Sedation: With Sedation     Scheduling Timeframe: After 10/20    Order Specific Question:   Where will this procedure be performed?    Answer:   [facility]   Follow-up plan:   Return in about 27 days (around 12/31/2018) for Procedure right L3, L4, L5, S1 RFA with sedation.   Recent Visits No visits were found meeting these conditions.  Showing recent visits within past 90 days and meeting all other requirements   Today's Visits Date Type Provider Dept  12/04/18 Office Visit Gillis Santa, MD [facility]  Showing today's visits and meeting all other requirements   Future Appointments No visits were found meeting these conditions.  Showing future appointments within next 90 days and meeting all other requirements   I discussed the assessment and treatment plan with the patient. The patient was provided an opportunity to ask questions and all were answered. The patient agreed with the plan and demonstrated an understanding of the instructions.  Patient advised to call back or seek an in-person evaluation if the symptoms or condition worsens.  Total duration of non-face-to-face encounter: 22 minutes.  Note by: Gillis Santa, MD Date: 12/04/2018; Time: 2:47 PM  Note: This dictation was prepared with Dragon dictation. Any transcriptional errors that may result from this process are unintentional.  Disclaimer:  * Given the special circumstances of the COVID-19 pandemic, the federal government has announced that the Office for Civil Rights (OCR) will exercise its enforcement discretion and will not impose penalties on physicians using telehealth in the event of  noncompliance with regulatory requirements under the [place] (HIPAA) in connection with the good faith provision of telehealth during the SNKNL-97 national public health emergency. ([place])

## 2018-12-10 ENCOUNTER — Other Ambulatory Visit: Payer: Self-pay

## 2018-12-10 ENCOUNTER — Encounter: Payer: Self-pay | Admitting: Emergency Medicine

## 2018-12-10 ENCOUNTER — Emergency Department
Admission: EM | Admit: 2018-12-10 | Discharge: 2018-12-10 | Disposition: A | Discharge status: Home / Self Care | Payer: Self-pay | Attending: Emergency Medicine | Admitting: Emergency Medicine

## 2018-12-10 DIAGNOSIS — Z794 Long term (current) use of insulin: Secondary | ICD-10-CM | POA: Insufficient documentation

## 2018-12-10 DIAGNOSIS — Z79899 Other long term (current) drug therapy: Secondary | ICD-10-CM | POA: Insufficient documentation

## 2018-12-10 DIAGNOSIS — I10 Essential (primary) hypertension: Secondary | ICD-10-CM | POA: Insufficient documentation

## 2018-12-10 DIAGNOSIS — M5441 Lumbago with sciatica, right side: Secondary | ICD-10-CM | POA: Insufficient documentation

## 2018-12-10 DIAGNOSIS — E119 Type 2 diabetes mellitus without complications: Secondary | ICD-10-CM | POA: Insufficient documentation

## 2018-12-10 MED ORDER — OXYCODONE-ACETAMINOPHEN 5-325 MG PO TABS
1.0000 | ORAL_TABLET | Freq: Once | ORAL | Status: AC
  Administered 2018-12-10: 1 via ORAL
  Filled 2018-12-10: qty 1

## 2018-12-10 MED ORDER — KETOROLAC TROMETHAMINE 30 MG/ML IJ SOLN
30.0000 mg | Freq: Once | INTRAMUSCULAR | Status: AC
  Administered 2018-12-10: 30 mg via INTRAMUSCULAR
  Filled 2018-12-10: qty 1

## 2018-12-10 MED ORDER — METHYLPREDNISOLONE SODIUM SUCC 125 MG IJ SOLR
125.0000 mg | Freq: Once | INTRAMUSCULAR | Status: AC
  Administered 2018-12-10: 125 mg via INTRAMUSCULAR
  Filled 2018-12-10: qty 2

## 2018-12-10 MED ORDER — ONDANSETRON 4 MG PO TBDP
4.0000 mg | ORAL_TABLET | Freq: Once | ORAL | Status: AC
  Administered 2018-12-10: 4 mg via ORAL
  Filled 2018-12-10: qty 1

## 2018-12-10 MED ORDER — PREDNISONE 10 MG (21) PO TBPK: ORAL_TABLET | ORAL | 0 refills | Status: DC

## 2018-12-10 NOTE — ED Triage Notes (Signed)
Patient states she has chronic back pain, worsening over the past 2 weeks. States she is scheduled to see a specialist but cannot wait until that appointment due to pain.

## 2018-12-10 NOTE — ED Provider Notes (Signed)
[facility] Emergency Department Provider Note  ____________________________________________  Time seen: Approximately 5:01 PM  I have reviewed the triage vital signs and the nursing notes.   HISTORY  Chief Complaint Back Pain    HPI Michele [name] is a 53 y.o. female presents to the emergency department with acute on chronic low back pain that radiates down the left lower extremity.  Patient states that she has had chronic low back pain for several years and has had a recent MRI of her lumbar spine.  Patient is scheduled for an ablation procedure but states that she came into the emergency department today for pain management.  She denies bowel or bladder incontinence or saddle anesthesia.  She has been able to ambulate at home.  No fever.  No other alleviating measures have been attempted.        Past Medical History:  Diagnosis Date  . Diabetes mellitus without complication ([place])   . Hypertension   . Migraines     Patient Active Problem List   Diagnosis Date Noted  . Lumbar facet joint syndrome 03/21/2018  . Chronic bilateral low back pain without sciatica 03/21/2018  . Chronic pain syndrome 03/21/2018  . Obesity, morbid, BMI 40.0-49.9 ([place]) 07/12/2017  . Bilateral occipital neuralgia 09/14/2016  . Neck pain 12/16/2014  . Migraines 04/28/2014  . Diabetes mellitus type 2, uncomplicated ([place]) 56/38/9373    Past Surgical History:  Procedure Laterality Date  . ABDOMINAL HYSTERECTOMY    . ANKLE SURGERY    . CESAREAN SECTION    . KNEE SURGERY    . WRIST SURGERY      Prior to Admission medications   Medication Sig Start Date End Date Taking? Authorizing Provider  amitriptyline (ELAVIL) 75 MG tablet Take 1 tablet (75 mg total) by mouth at bedtime. Patient not taking: Reported on 12/03/2018 03/20/18   Gillis Santa, MD  atorvastatin (LIPITOR) 20 MG tablet Take 20 mg by mouth daily at 6 PM.    [provider]  cyclobenzaprine (FLEXERIL)  10 MG tablet Take 1 tablet (10 mg total) by mouth 3 (three) times daily as needed. 10/25/18   Triplett, Cari B, FNP  glipiZIDE (GLUCOTROL) 5 MG tablet Take 5 mg by mouth daily before breakfast.    [provider]  HYDROcodone-acetaminophen (NORCO/VICODIN) 5-325 MG tablet Take 1 tablet by mouth every 6 (six) hours as needed for moderate pain. Patient not taking: Reported on 12/03/2018 11/20/18   Versie Starks, PA-C  insulin glargine (LANTUS) 100 UNIT/ML injection Inject 18 Units into the skin at bedtime.     [provider]  lidocaine (LIDODERM) 5 % Place 1 patch onto the skin daily. Remove & Discard patch within 12 hours or as directed by MD    [provider]  meloxicam (MOBIC) 15 MG tablet Take 1 tablet (15 mg total) by mouth daily. 11/20/18 11/20/19  Fisher, Linden Dolin, PA-C  metFORMIN (GLUCOPHAGE) 1000 MG tablet Take 1 tablet (1,000 mg total) by mouth 2 (two) times daily with a meal. Patient taking differently: Take 500 mg by mouth daily.  03/29/17 12/03/18  Earleen Newport, MD  oxyCODONE-acetaminophen (PERCOCET) 10-325 MG tablet Take 1 tablet by mouth every 6 (six) hours as needed for pain. Patient not taking: Reported on 12/03/2018 10/25/18 10/25/19  Victorino Dike, FNP  SUMAtriptan-naproxen (TREXIMET) 85-500 MG per tablet Take 1 tablet by mouth every 2 (two) hours as needed for migraine.    [provider]  topiramate (TOPAMAX) 100  MG tablet Take 100 mg by mouth 2 (two) times daily.    [provider]  triamterene-hydrochlorothiazide (MAXZIDE-25) 37.5-25 MG per tablet Take 1 tablet by mouth daily.    [provider]  verapamil (COVERA HS) 180 MG (CO) 24 hr tablet Take 180 mg by mouth at bedtime.    [provider]    Allergies Patient has no known allergies.  Family History  Problem Relation Age of Onset  . Breast cancer Neg Hx     Social History Social History   Tobacco Use  . Smoking status: Never Smoker  . Smokeless  tobacco: Current User    Types: Snuff  Substance Use Topics  . Alcohol use: Yes    Comment: occasionally  . Drug use: No     Review of Systems  Constitutional: No fever/chills Eyes: No visual changes. No discharge ENT: No upper respiratory complaints. Cardiovascular: no chest pain. Respiratory: no cough. No SOB. Gastrointestinal: No abdominal pain.  No nausea, no vomiting.  No diarrhea.  No constipation. Genitourinary: Negative for dysuria. No hematuria Musculoskeletal: Patient has low back pain.  Skin: Negative for rash, abrasions, lacerations, ecchymosis. Neurological: Negative for headaches, focal weakness or numbness.   ____________________________________________   PHYSICAL EXAM:  VITAL SIGNS: ED Triage Vitals  Enc Vitals Group     BP 12/10/18 1543 132/79     Pulse Rate 12/10/18 1543 80     Resp 12/10/18 1543 16     Temp 12/10/18 1543 98.6 F (37 C)     Temp Source 12/10/18 1543 Oral     SpO2 12/10/18 1543 98 %     Weight 12/10/18 1544 257 lb (116.6 kg)     Height 12/10/18 1544 5\' 6"  (1.676 m)     Head Circumference --      Peak Flow --      Pain Score 12/10/18 1544 10     Pain Loc --      Pain Edu? --      Excl. in GC? --      Constitutional: Alert and oriented. Well appearing and in no acute distress. Eyes: Conjunctivae are normal. PERRL. EOMI. Head: Atraumatic. ENT:      Nose: No congestion/rhinnorhea.      Mouth/Throat: Mucous membranes are moist.  Neck: No stridor.  No cervical spine tenderness to palpation. Hematological/Lymphatic/Immunilogical: No cervical lymphadenopathy. Cardiovascular: Normal rate, regular rhythm. Normal S1 and S2.  Good peripheral circulation. Respiratory: Normal respiratory effort without tachypnea or retractions. Lungs CTAB. Good air entry to the bases with no decreased or absent breath sounds. Gastrointestinal: Bowel sounds 4 quadrants. Soft and nontender to palpation. No guarding or rigidity. No palpable masses. No  distention. No CVA tenderness. Musculoskeletal: Full range of motion to all extremities. No gross deformities appreciated.  Patient has paraspinal muscle tenderness along the right.  Positive straight leg raise test, right. Neurologic:  Normal speech and language. No gross focal neurologic deficits are appreciated.  Skin:  Skin is warm, dry and intact. No rash noted. Psychiatric: Mood and affect are normal. Speech and behavior are normal. Patient exhibits appropriate insight and judgement.   ____________________________________________   LABS (all labs ordered are listed, but only abnormal results are displayed)  Labs Reviewed - No data to display ____________________________________________  EKG   ____________________________________________  RADIOLOGY I personally viewed and evaluated these images as part of my medical decision making, as well as reviewing the written report by the radiologist.    No results found.  ____________________________________________  PROCEDURES  Procedure(s) performed:    Procedures    Medications  ketorolac (TORADOL) 30 MG/ML injection 30 mg (has no administration in time range)  methylPREDNISolone sodium succinate (SOLU-MEDROL) 125 mg/2 mL injection 125 mg (has no administration in time range)  oxyCODONE-acetaminophen (PERCOCET/ROXICET) 5-325 MG per tablet 1 tablet (has no administration in time range)  ondansetron (ZOFRAN-ODT) disintegrating tablet 4 mg (has no administration in time range)     ____________________________________________   INITIAL IMPRESSION / ASSESSMENT AND PLAN / ED COURSE  Pertinent labs & imaging results that were available during my care of the patient were reviewed by me and considered in my medical decision making (see chart for details).  Review of the [place] CSRS was performed in accordance of the [facility] prior to dispensing any controlled drugs.        Assessment and Plan:  Chronic low back  pain 53 year old female presents to the emergency department with acute on chronic low back pain.  Vital signs were reassuring at triage.  Patient had right-sided paraspinal muscle tenderness and a positive straight leg raise test on the right.  Patient was given Toradol, Solu-Medrol and a Percocet in the emergency department.  She reported that her pain improved some prior to discharge.  She was discharged with a prednisone taper.  She was advised to follow-up with neurosurgery.  All patient questions were answered.   ____________________________________________  FINAL CLINICAL IMPRESSION(S) / ED DIAGNOSES  Final diagnoses:  None      NEW MEDICATIONS STARTED DURING THIS VISIT:  ED Discharge Orders    None          This chart was dictated using voice recognition software/Dragon. Despite best efforts to proofread, errors can occur which can change the meaning. Any change was purely unintentional.    Orvil Feil, PA-C 12/10/18 1748    Minna Antis, MD 12/10/18 2253

## 2018-12-10 NOTE — ED Notes (Signed)
Pt st "I am not driving, I have a ride home". This RN confirmed ride home before med admx.

## 2019-01-06 ENCOUNTER — Other Ambulatory Visit: Payer: Self-pay

## 2019-01-06 ENCOUNTER — Emergency Department
Admission: EM | Admit: 2019-01-06 | Discharge: 2019-01-06 | Disposition: A | Discharge status: Home / Self Care | Payer: BC Managed Care – PPO | Attending: Student | Admitting: Student

## 2019-01-06 DIAGNOSIS — Z79899 Other long term (current) drug therapy: Secondary | ICD-10-CM | POA: Insufficient documentation

## 2019-01-06 DIAGNOSIS — Z20828 Contact with and (suspected) exposure to other viral communicable diseases: Secondary | ICD-10-CM | POA: Insufficient documentation

## 2019-01-06 DIAGNOSIS — Z794 Long term (current) use of insulin: Secondary | ICD-10-CM | POA: Diagnosis not present

## 2019-01-06 DIAGNOSIS — E119 Type 2 diabetes mellitus without complications: Secondary | ICD-10-CM | POA: Insufficient documentation

## 2019-01-06 DIAGNOSIS — I10 Essential (primary) hypertension: Secondary | ICD-10-CM | POA: Diagnosis not present

## 2019-01-06 DIAGNOSIS — J029 Acute pharyngitis, unspecified: Secondary | ICD-10-CM | POA: Diagnosis not present

## 2019-01-06 LAB — GROUP A STREP BY PCR: Group A Strep by PCR: NOT DETECTED

## 2019-01-06 LAB — MONONUCLEOSIS SCREEN: Mono Screen: NEGATIVE

## 2019-01-06 MED ORDER — KETOROLAC TROMETHAMINE 30 MG/ML IJ SOLN
30.0000 mg | Freq: Once | INTRAMUSCULAR | Status: AC
  Administered 2019-01-06: 20:00:00 30 mg via INTRAVENOUS
  Filled 2019-01-06: qty 1

## 2019-01-06 MED ORDER — SODIUM CHLORIDE 0.9 % IV BOLUS
1000.0000 mL | Freq: Once | INTRAVENOUS | Status: AC
  Administered 2019-01-06: 18:00:00 1000 mL via INTRAVENOUS

## 2019-01-06 MED ORDER — ORPHENADRINE CITRATE 30 MG/ML IJ SOLN
60.0000 mg | Freq: Once | INTRAMUSCULAR | Status: AC
  Administered 2019-01-06: 20:00:00 60 mg via INTRAVENOUS
  Filled 2019-01-06: qty 2

## 2019-01-06 MED ORDER — MAGIC MOUTHWASH W/LIDOCAINE: 5.0000 mL | Freq: Four times a day (QID) | ORAL | 0 refills | Status: DC

## 2019-01-06 NOTE — ED Triage Notes (Signed)
Sore throat X 5 days, headache. Pt reports she had COVID 1 day after sore throat. Fatigue. Pt alert and oriented X4, cooperative, RR even and unlabored, color WNL. Pt in NAD.

## 2019-01-06 NOTE — ED Provider Notes (Signed)
[facility] Emergency Department Provider Note  ____________________________________________  Time seen: Approximately 5:51 PM  I have reviewed the triage vital signs and the nursing notes.   HISTORY  Chief Complaint Sore Throat, Headache, and Generalized Body Aches    HPI Michele [name] is a 53 y.o. female who presents the emergency department complaining of sore throat, malaise and fatigue.  Patient states that she has had symptoms x5 days.  She has had headache, some nausea but no emesis.  Patient states that she was tested for Covid on the day that her symptoms began but was told that it would likely be too early to know anything."  Patient had a negative Covid swab on the symptoms began.  Patient denies any frank fevers but endorses chills.  She states that her primary complaint is sore throat and fatigue.  Patient has a history of diabetes, hypertension, migraines and chronic back pain.         Past Medical History:  Diagnosis Date  . Diabetes mellitus without complication (HCC)   . Hypertension   . Migraines     Patient Active Problem List   Diagnosis Date Noted  . Lumbar facet joint syndrome 03/21/2018  . Chronic bilateral low back pain without sciatica 03/21/2018  . Chronic pain syndrome 03/21/2018  . Obesity, morbid, BMI 40.0-49.9 (HCC) 07/12/2017  . Bilateral occipital neuralgia 09/14/2016  . Neck pain 12/16/2014  . Migraines 04/28/2014  . Diabetes mellitus type 2, uncomplicated (HCC) 04/28/2014    Past Surgical History:  Procedure Laterality Date  . ABDOMINAL HYSTERECTOMY    . ANKLE SURGERY    . CESAREAN SECTION    . KNEE SURGERY    . WRIST SURGERY      Prior to Admission medications   Medication Sig Start Date End Date Taking? Authorizing Provider  atorvastatin (LIPITOR) 20 MG tablet Take 20 mg by mouth daily at 6 PM.    [provider]  cyclobenzaprine (FLEXERIL) 10 MG tablet Take 1 tablet (10 mg total) by mouth 3  (three) times daily as needed. 10/25/18   Triplett, Cari B, FNP  glipiZIDE (GLUCOTROL) 5 MG tablet Take 5 mg by mouth daily before breakfast.    [provider]  insulin glargine (LANTUS) 100 UNIT/ML injection Inject 18 Units into the skin at bedtime.     [provider]  lidocaine (LIDODERM) 5 % Place 1 patch onto the skin daily. Remove & Discard patch within 12 hours or as directed by MD    [provider]  magic mouthwash w/lidocaine SOLN Take 5 mLs by mouth 4 (four) times daily. 01/06/19   Roshaun Pound, Delorise Royals, PA-C  meloxicam (MOBIC) 15 MG tablet Take 1 tablet (15 mg total) by mouth daily. 11/20/18 11/20/19  Fisher, Roselyn Bering, PA-C  metFORMIN (GLUCOPHAGE) 1000 MG tablet Take 1 tablet (1,000 mg total) by mouth 2 (two) times daily with a meal. Patient taking differently: Take 500 mg by mouth daily.  03/29/17 12/03/18  Emily Filbert, MD  oxyCODONE-acetaminophen (PERCOCET) 10-325 MG tablet Take 1 tablet by mouth every 6 (six) hours as needed for pain. Patient not taking: Reported on 12/03/2018 10/25/18 10/25/19  Chinita Pester, FNP  SUMAtriptan-naproxen (TREXIMET) 85-500 MG per tablet Take 1 tablet by mouth every 2 (two) hours as needed for migraine.    [provider]  topiramate (TOPAMAX) 100 MG tablet Take 100 mg by mouth 2 (two) times daily.    [provider]  triamterene-hydrochlorothiazide (MAXZIDE-25) 37.5-25 MG  per tablet Take 1 tablet by mouth daily.    [provider]  verapamil (COVERA HS) 180 MG (CO) 24 hr tablet Take 180 mg by mouth at bedtime.    [provider]    Allergies Patient has no known allergies.  Family History  Problem Relation Age of Onset  . Breast cancer Neg Hx     Social History Social History   Tobacco Use  . Smoking status: Never Smoker  . Smokeless tobacco: Current User    Types: Snuff  Substance Use Topics  . Alcohol use: Yes    Comment: occasionally  . Drug use: No     Review of  Systems  Constitutional: Denies fever, endorses chills.  Malaise and fatigue Eyes: No visual changes. No discharge ENT: Positive for sore throat Cardiovascular: no chest pain. Respiratory: no cough. No SOB. Gastrointestinal: No abdominal pain.  Mild nausea, no vomiting.  No diarrhea.  No constipation. Genitourinary: Negative for dysuria. No hematuria Musculoskeletal: Negative for musculoskeletal pain. Skin: Negative for rash, abrasions, lacerations, ecchymosis. Neurological: Negative for headaches, focal weakness or numbness. 10-point ROS otherwise negative.  ____________________________________________   PHYSICAL EXAM:  VITAL SIGNS: ED Triage Vitals  Enc Vitals Group     BP 01/06/19 1630 127/83     Pulse Rate 01/06/19 1630 92     Resp 01/06/19 1630 18     Temp 01/06/19 1630 98.9 F (37.2 C)     Temp Source 01/06/19 1630 Oral     SpO2 01/06/19 1630 98 %     Weight 01/06/19 1631 255 lb 11.7 oz (116 kg)     Height 01/06/19 1631 5\' 6"  (1.676 m)     Head Circumference --      Peak Flow --      Pain Score 01/06/19 1631 10     Pain Loc --      Pain Edu? --      Excl. in GC? --      Constitutional: Alert and oriented. Well appearing and in no acute distress. Eyes: Conjunctivae are normal. PERRL. EOMI. Head: Atraumatic. ENT:      Ears:       Nose: No congestion/rhinnorhea.      Mouth/Throat: Mucous membranes are moist.  Oropharynx is erythematous but nonedematous.  No exudates appreciated.  Uvula is midline. Neck: No stridor.  Neck is supple full range of motion Hematological/Lymphatic/Immunilogical: Anterior and posterior scattered tender cervical lymphadenopathy. Cardiovascular: Normal rate, regular rhythm. Normal S1 and S2.  Good peripheral circulation. Respiratory: Normal respiratory effort without tachypnea or retractions. Lungs CTAB. Good air entry to the bases with no decreased or absent breath sounds. Gastrointestinal: Bowel sounds 4 quadrants. Soft and nontender to  palpation. No guarding or rigidity. No palpable masses. No distention. No CVA tenderness. Musculoskeletal: Full range of motion to all extremities. No gross deformities appreciated. Neurologic:  Normal speech and language. No gross focal neurologic deficits are appreciated.  Skin:  Skin is warm, dry and intact. No rash noted. Psychiatric: Mood and affect are normal. Speech and behavior are normal. Patient exhibits appropriate insight and judgement.   ____________________________________________   LABS (all labs ordered are listed, but only abnormal results are displayed)  Labs Reviewed  GROUP A STREP BY PCR  NOVEL CORONAVIRUS, NAA (HOSP ORDER, SEND-OUT TO REF LAB; TAT 18-24 HRS)  MONONUCLEOSIS SCREEN   ____________________________________________  EKG   ____________________________________________  RADIOLOGY   No results found.  ____________________________________________    PROCEDURES  Procedure(s) performed:  Procedures    Medications  ketorolac (TORADOL) 30 MG/ML injection 30 mg (has no administration in time range)  orphenadrine (NORFLEX) injection 60 mg (has no administration in time range)  sodium chloride 0.9 % bolus 1,000 mL (1,000 mLs Intravenous New Bag/Given 01/06/19 1829)     ____________________________________________   INITIAL IMPRESSION / ASSESSMENT AND PLAN / ED COURSE  Pertinent labs & imaging results that were available during my care of the patient were reviewed by me and considered in my medical decision making (see chart for details).  Review of the [place] CSRS was performed in accordance of the [place] prior to dispensing any controlled drugs.           Patient's diagnosis is consistent with sore throat.  Patient presented to emergency department complaining of sore throat for the past 5 days.  On initial day of complaint, patient was tested for Covid which was negative.  As this was first day of symptoms, I did repeat the Covid swab as  it was likely too early to definitively test for Covid.  Patient was also tested for strep and mono.  Both of these returned with negative results.  I suspect that this is likely viral pharyngitis and I will treat the patient with symptom control medications.  As I gave patient diagnosis, told her what medications I be prescribed patient endorses ongoing lower back pain.  Patient has a history of chronic back pain and has been seen in this department multiple times for same.  Patient denies any new changes but states that "when I am typically here they give me shots which makes it feel better, I would like the shots before I leave."  Patient denied any injuries to the area.  No radicular symptoms.  Exam was benign.  Patient will be given Toradol and Norflex for her back no prescriptions at this time on discharge for her chronic back pain.  Follow-up with primary care as needed.  No indication at this time for labs or imaging..  Patient is given ED precautions to return to the ED for any worsening or new symptoms.     ____________________________________________  FINAL CLINICAL IMPRESSION(S) / ED DIAGNOSES  Final diagnoses:  Sore throat      NEW MEDICATIONS STARTED DURING THIS VISIT:  ED Discharge Orders         Ordered    magic mouthwash w/lidocaine SOLN  4 times daily    Note to Pharmacy: Dispense in a 1/1/1 ratio. Use lidocaine, diphenhydramine, prednisolone   01/06/19 1945              This chart was dictated using voice recognition software/Dragon. Despite best efforts to proofread, errors can occur which can change the meaning. Any change was purely unintentional.    Darletta Moll, PA-C 01/06/19 1947    Lilia Pro., MD 01/07/19 [phone]

## 2019-01-06 NOTE — ED Notes (Signed)
See triage note  Presents with sore throat for 5 days   Now having h/a  Afebrile on arrival  States she did have a negative COVID test

## 2019-01-08 LAB — NOVEL CORONAVIRUS, NAA (HOSP ORDER, SEND-OUT TO REF LAB; TAT 18-24 HRS): SARS-CoV-2, NAA: NOT DETECTED

## 2019-01-16 ENCOUNTER — Emergency Department
Admission: EM | Admit: 2019-01-16 | Discharge: 2019-01-16 | Disposition: A | Discharge status: Home / Self Care | Payer: BC Managed Care – PPO | Attending: Emergency Medicine | Admitting: Emergency Medicine

## 2019-01-16 ENCOUNTER — Other Ambulatory Visit: Payer: Self-pay

## 2019-01-16 DIAGNOSIS — Z794 Long term (current) use of insulin: Secondary | ICD-10-CM | POA: Insufficient documentation

## 2019-01-16 DIAGNOSIS — M549 Dorsalgia, unspecified: Secondary | ICD-10-CM | POA: Diagnosis present

## 2019-01-16 DIAGNOSIS — Z79899 Other long term (current) drug therapy: Secondary | ICD-10-CM | POA: Insufficient documentation

## 2019-01-16 DIAGNOSIS — E119 Type 2 diabetes mellitus without complications: Secondary | ICD-10-CM | POA: Diagnosis not present

## 2019-01-16 DIAGNOSIS — M47896 Other spondylosis, lumbar region: Secondary | ICD-10-CM | POA: Insufficient documentation

## 2019-01-16 DIAGNOSIS — M5432 Sciatica, left side: Secondary | ICD-10-CM

## 2019-01-16 DIAGNOSIS — I10 Essential (primary) hypertension: Secondary | ICD-10-CM | POA: Insufficient documentation

## 2019-01-16 DIAGNOSIS — M47816 Spondylosis without myelopathy or radiculopathy, lumbar region: Secondary | ICD-10-CM

## 2019-01-16 MED ORDER — DEXAMETHASONE SODIUM PHOSPHATE 10 MG/ML IJ SOLN
10.0000 mg | Freq: Once | INTRAMUSCULAR | Status: AC
  Administered 2019-01-16: 10 mg via INTRAMUSCULAR
  Filled 2019-01-16: qty 1

## 2019-01-16 MED ORDER — CYCLOBENZAPRINE HCL 10 MG PO TABS
10.0000 mg | ORAL_TABLET | Freq: Once | ORAL | Status: AC
  Administered 2019-01-16: 10 mg via ORAL
  Filled 2019-01-16: qty 1

## 2019-01-16 MED ORDER — OXYCODONE HCL 5 MG PO TABS: 5.0000 mg | ORAL_TABLET | Freq: Four times a day (QID) | ORAL | 0 refills | Status: DC | PRN

## 2019-01-16 MED ORDER — CYCLOBENZAPRINE HCL 10 MG PO TABS: 10.0000 mg | ORAL_TABLET | Freq: Three times a day (TID) | ORAL | 0 refills | Status: DC | PRN

## 2019-01-16 MED ORDER — OXYCODONE-ACETAMINOPHEN 5-325 MG PO TABS
1.0000 | ORAL_TABLET | Freq: Once | ORAL | Status: AC
  Administered 2019-01-16: 22:00:00 1 via ORAL
  Filled 2019-01-16: qty 1

## 2019-01-16 NOTE — ED Provider Notes (Addendum)
[facility] Emergency Department Provider Note  ____________________________________________  Time seen: Approximately 8:14 PM  The following is a medical screening exam note. It is intended that the patient await an ER room assignment for detailed exam, diagnosis, and disposition.  I have reviewed the triage vital signs.    HISTORY  Chief Complaint No chief complaint on file.   HPI Michele [name] is a 53 y.o. female presents to the emergency department for treatment and evaluation of back pain. Pain started 3 days ago. She has a history of right side back pain, but this is on the left. No relief with ibuprofen and pain patches.  No known injury.  Past Medical History:  Diagnosis Date  . Diabetes mellitus without complication ([place])   . Hypertension   . Migraines     Patient Active Problem List   Diagnosis Date Noted  . Lumbar facet joint syndrome 03/21/2018  . Chronic bilateral low back pain without sciatica 03/21/2018  . Chronic pain syndrome 03/21/2018  . Obesity, morbid, BMI 40.0-49.9 ([place]) 07/12/2017  . Bilateral occipital neuralgia 09/14/2016  . Neck pain 12/16/2014  . Migraines 04/28/2014  . Diabetes mellitus type 2, uncomplicated ([place]) [id number]/[id number]    Past Surgical History:  Procedure Laterality Date  . ABDOMINAL HYSTERECTOMY    . ANKLE SURGERY    . CESAREAN SECTION    . KNEE SURGERY    . WRIST SURGERY      Prior to Admission medications   Medication Sig Start Date End Date Taking? Authorizing Provider  atorvastatin (LIPITOR) 20 MG tablet Take 20 mg by mouth daily at 6 PM.    [provider]  cyclobenzaprine (FLEXERIL) 10 MG tablet Take 1 tablet (10 mg total) by mouth 3 (three) times daily as needed. 10/25/18   Ewa Hipp B, FNP  glipiZIDE (GLUCOTROL) 5 MG tablet Take 5 mg by mouth daily before breakfast.    [provider]  insulin glargine (LANTUS) 100 UNIT/ML injection Inject 18 Units into the skin at bedtime.      [provider]  lidocaine (LIDODERM) 5 % Place 1 patch onto the skin daily. Remove & Discard patch within 12 hours or as directed by MD    [provider]  magic mouthwash w/lidocaine SOLN Take 5 mLs by mouth 4 (four) times daily. 01/06/19   Cuthriell, Charline Bills, PA-C  meloxicam (MOBIC) 15 MG tablet Take 1 tablet (15 mg total) by mouth daily. 11/20/18 11/20/19  Fisher, Linden Dolin, PA-C  metFORMIN (GLUCOPHAGE) 1000 MG tablet Take 1 tablet (1,000 mg total) by mouth 2 (two) times daily with a meal. Patient taking differently: Take 500 mg by mouth daily.  03/29/17 12/03/18  Earleen Newport, MD  oxyCODONE-acetaminophen (PERCOCET) 10-325 MG tablet Take 1 tablet by mouth every 6 (six) hours as needed for pain. Patient not taking: Reported on 12/03/2018 10/25/18 10/25/19  Victorino Dike, FNP  SUMAtriptan-naproxen (TREXIMET) 85-500 MG per tablet Take 1 tablet by mouth every 2 (two) hours as needed for migraine.    [provider]  topiramate (TOPAMAX) 100 MG tablet Take 100 mg by mouth 2 (two) times daily.    [provider]  triamterene-hydrochlorothiazide (MAXZIDE-25) 37.5-25 MG per tablet Take 1 tablet by mouth daily.    [provider]  verapamil (COVERA HS) 180 MG (CO) 24 hr tablet Take 180 mg by mouth at bedtime.    [provider]    Allergies Patient has no known allergies.  Family History  Problem Relation Age of Onset  . Breast cancer Neg Hx     Social History Social History   Tobacco Use  . Smoking status: Never Smoker  . Smokeless tobacco: Current User    Types: Snuff  Substance Use Topics  . Alcohol use: Yes    Comment: occasionally  . Drug use: No    Review of Systems Constitutional: Negative for fever. ENT: Negative for sore throat. Respiratory: Negative for cough. Gastrointestinal: No abdominal pain.  No nausea, no vomiting.  No diarrhea.  Musculoskeletal: Positive for left side back pain. Skin: Negative for  rash/lesion/wound. Neurological: Negative for headaches, focal weakness or numbness.  ____________________________________________   PHYSICAL EXAM:  VITAL SIGNS:  Today's Vitals   01/16/19 2015 01/16/19 2016  BP: 117/76   Pulse: 74   Resp: 14   Temp: 99.1 F (37.3 C)   TempSrc: Oral   SpO2: 100%   PainSc:  10-Worst pain ever   There is no height or weight on file to calculate BMI.   Constitutional: Alert and oriented. No acute distress. Head: Atraumatic. Nose: No congestion/rhinnorhea. Mouth/Throat: Mucous membranes are moist. Neck: No stridor.  Cardiovascular: Good peripheral circulation. Respiratory: Normal respiratory effort. Musculoskeletal: No restriction Neurologic:  Normal speech and language. No gross focal neurologic deficits are appreciated. Speech is normal. No gait instability. Skin:  Skin is warm, dry and intact. No rash noted. Psychiatric: Mood and affect are normal. Speech and behavior are normal.  ____________________________________________   LABS (all labs ordered are listed, but only abnormal results are displayed)  Labs Reviewed - No data to display ____________________________________________  EKG  N/a ____________________________________________   INITIAL CLINICAL IMPRESSION(S)   Sciatica, left side.       Chinita Pester, FNP 01/16/19 2017    Chinita Pester, FNP 01/16/19 2018    Shaune Pollack, MD 01/17/19 [phone]

## 2019-01-16 NOTE — ED Triage Notes (Signed)
Pt presents via POV c/o left sided back pain extending into left leg x3 days. Reports hx right sided back pain. Denies injury.

## 2019-01-16 NOTE — Discharge Instructions (Addendum)
Please take medications as prescribed.  Return to the ER for any worsening symptoms or urgent changes in your health.  Avoid physical activity that reproduces pain.  Follow-up with PCP

## 2019-01-16 NOTE — ED Notes (Signed)
ED Provider at bedside. 

## 2019-01-16 NOTE — ED Provider Notes (Signed)
[facility] EMERGENCY DEPARTMENT Provider Note   CSN: [id number] Arrival date & time: 01/16/19  1943     History   Chief Complaint Chief Complaint  Patient presents with  . Back Pain    HPI Michele [name] is a 53 y.o. female presents emergency department for evaluation of low back pain x3 days.  History of lower back pain, spondylosis with mild stenosis on the left side.  Had MRI earlier this year.  She denies any trauma or injury.  Patient states her back pain is flared up and she feels pain numbness and tingling shooting down the left lateral hip, lateral leg.  No loss of bowel or bladder symptoms.  She is ambulatory.  She has been taking over-the-counter medications with no improvement.     HPI  Past Medical History:  Diagnosis Date  . Diabetes mellitus without complication (HCC)   . Hypertension   . Migraines     Patient Active Problem List   Diagnosis Date Noted  . Lumbar facet joint syndrome 03/21/2018  . Chronic bilateral low back pain without sciatica 03/21/2018  . Chronic pain syndrome 03/21/2018  . Obesity, morbid, BMI 40.0-49.9 (HCC) 07/12/2017  . Bilateral occipital neuralgia 09/14/2016  . Neck pain 12/16/2014  . Migraines 04/28/2014  . Diabetes mellitus type 2, uncomplicated (HCC) 04/28/2014    Past Surgical History:  Procedure Laterality Date  . ABDOMINAL HYSTERECTOMY    . ANKLE SURGERY    . CESAREAN SECTION    . KNEE SURGERY    . WRIST SURGERY       OB History   No obstetric history on file.      Home Medications    Prior to Admission medications   Medication Sig Start Date End Date Taking? Authorizing Provider  atorvastatin (LIPITOR) 20 MG tablet Take 20 mg by mouth daily at 6 PM.    [provider]  cyclobenzaprine (FLEXERIL) 10 MG tablet Take 1 tablet (10 mg total) by mouth 3 (three) times daily as needed for muscle spasms. 01/16/19   Evon SlackGaines, Thomas C, PA-C  glipiZIDE (GLUCOTROL) 5 MG tablet Take 5 mg by  mouth daily before breakfast.    [provider]  insulin glargine (LANTUS) 100 UNIT/ML injection Inject 18 Units into the skin at bedtime.     [provider]  lidocaine (LIDODERM) 5 % Place 1 patch onto the skin daily. Remove & Discard patch within 12 hours or as directed by MD    [provider]  magic mouthwash w/lidocaine SOLN Take 5 mLs by mouth 4 (four) times daily. 01/06/19   Cuthriell, Delorise RoyalsJonathan D, PA-C  meloxicam (MOBIC) 15 MG tablet Take 1 tablet (15 mg total) by mouth daily. 11/20/18 11/20/19  Fisher, Roselyn BeringSusan W, PA-C  metFORMIN (GLUCOPHAGE) 1000 MG tablet Take 1 tablet (1,000 mg total) by mouth 2 (two) times daily with a meal. Patient taking differently: Take 500 mg by mouth daily.  03/29/17 12/03/18  Emily FilbertWilliams, Jonathan E, MD  oxyCODONE (ROXICODONE) 5 MG immediate release tablet Take 1 tablet (5 mg total) by mouth every 6 (six) hours as needed. 01/16/19 01/16/20  Evon SlackGaines, Thomas C, PA-C  oxyCODONE-acetaminophen (PERCOCET) 10-325 MG tablet Take 1 tablet by mouth every 6 (six) hours as needed for pain. Patient not taking: Reported on 12/03/2018 10/25/18 10/25/19  Chinita Pesterriplett, Cari B, FNP  SUMAtriptan-naproxen (TREXIMET) 85-500 MG per tablet Take 1 tablet by mouth every 2 (two) hours as needed for migraine.    [provider]  topiramate (TOPAMAX) 100 MG tablet Take 100 mg by mouth 2 (two) times daily.    [provider]  triamterene-hydrochlorothiazide (MAXZIDE-25) 37.5-25 MG per tablet Take 1 tablet by mouth daily.    [provider]  verapamil (COVERA HS) 180 MG (CO) 24 hr tablet Take 180 mg by mouth at bedtime.    [provider]    Family History Family History  Problem Relation Age of Onset  . Breast cancer Neg Hx     Social History Social History   Tobacco Use  . Smoking status: Never Smoker  . Smokeless tobacco: Current User    Types: Snuff  Substance Use Topics  . Alcohol use: Yes    Comment: occasionally  . Drug use:  No     Allergies   Patient has no known allergies.   Review of Systems Review of Systems  Constitutional: Negative for fever.  Respiratory: Negative for shortness of breath.   Cardiovascular: Negative for chest pain.  Gastrointestinal: Negative for abdominal pain.  Genitourinary: Negative for dysuria.  Musculoskeletal: Positive for back pain.  Skin: Negative for rash.  Neurological: Positive for numbness. Negative for dizziness, light-headedness and headaches.     Physical Exam Updated Vital Signs BP 117/76   Pulse 74   Temp 99.1 F (37.3 C) (Oral)   Resp 14   SpO2 100%   Physical Exam Constitutional:      Appearance: She is well-developed.  HENT:     Head: Normocephalic and atraumatic.  Eyes:     Conjunctiva/sclera: Conjunctivae normal.  Neck:     Musculoskeletal: Normal range of motion.  Cardiovascular:     Rate and Rhythm: Normal rate.  Pulmonary:     Effort: Pulmonary effort is normal. No respiratory distress.  Musculoskeletal: Normal range of motion.     Comments: Lumbar Spine: Examination of the lumbar spine reveals no bony abnormality, no edema, and no ecchymosis.  There is no step off.  Decreased range of motion lumbar spine with flexion extension.  Pain mostly with lumbar flexion.  The patient has a negative axial load test, and a negative rotational Waddell test.  The patient is non tender along the spinous process.  The patient is non tender along the paravertebral muscles, with no muscle spasms.  The patient is non tender along the iliac crest.  The patient is non tender in the sciatic notch.  The patient is non tender along the Sacroiliac joint.  There is no Coccyx joint tenderness.    Bilateral Lower Extremities: Examination of the lower extremities reveals no bony abnormality, no edema, and no ecchymosis.  The patient has full active and passive range of motion of the hips, knees, and ankles.  There is no discomfort with range of motion exercises.  The  patient is non tender along the greater trochanter region.  The patient has a negative Bevelyn Buckles' test bilaterally.  There is normal skin warmth.  There is normal capillary refill bilaterally.    Neurologic: The patient has a negative straight leg raise.  The patient has normal muscle strength testing for the quadriceps, calves, ankle dorsiflexion, ankle plantarflexion, and extensor hallicus longus.  The patient has sensation that is intact to light touch.    Skin:    General: Skin is warm.     Findings: No rash.  Neurological:     Mental Status: She is alert and oriented to person, place, and time.  Psychiatric:        Behavior: Behavior normal.  Thought Content: Thought content normal.      ED Treatments / Results  Labs (all labs ordered are listed, but only abnormal results are displayed) Labs Reviewed - No data to display  EKG None  Radiology No results found.  Procedures Procedures (including critical care time)  Medications Ordered in ED Medications  oxyCODONE-acetaminophen (PERCOCET/ROXICET) 5-325 MG per tablet 1 tablet (1 tablet Oral Given 01/16/19 2144)  dexamethasone (DECADRON) injection 10 mg (10 mg Intramuscular Given 01/16/19 2144)  cyclobenzaprine (FLEXERIL) tablet 10 mg (10 mg Oral Given 01/16/19 2144)     Initial Impression / Assessment and Plan / ED Course  I have reviewed the triage vital signs and the nursing notes.  Pertinent labs & imaging results that were available during my care of the patient were reviewed by me and considered in my medical decision making (see chart for details).        53 year old female with acute on chronic low back pain.  History and exam consistent with left lumbar radiculopathy.  No weakness or neurological deficits.  No trauma or injury.  Has had recent MRI showing significant lumbar spondylosis with mild left neuroforaminal stenosis.  Will give dexamethasone IM.  She is given oxycodone and Flexeril to take at home as  needed for severe pain.  She has an appointment in the near future with pain doctor.  She understands signs and symptoms return to the ED for.  Final Clinical Impressions(s) / ED Diagnoses   Final diagnoses:  Lumbar spondylosis  Sciatica of left side    ED Discharge Orders         Ordered    oxyCODONE (ROXICODONE) 5 MG immediate release tablet  Every 6 hours PRN     01/16/19 2152    cyclobenzaprine (FLEXERIL) 10 MG tablet  3 times daily PRN     01/16/19 2152           Ronnette Juniper 01/16/19 2159    Sharyn Creamer, MD 01/21/19 [phone]

## 2019-02-20 ENCOUNTER — Emergency Department: Payer: BC Managed Care – PPO

## 2019-02-20 ENCOUNTER — Other Ambulatory Visit: Payer: Self-pay

## 2019-02-20 ENCOUNTER — Encounter: Payer: Self-pay | Admitting: Emergency Medicine

## 2019-02-20 ENCOUNTER — Emergency Department
Admission: EM | Admit: 2019-02-20 | Discharge: 2019-02-20 | Disposition: A | Discharge status: Home / Self Care | Payer: BC Managed Care – PPO | Attending: Emergency Medicine | Admitting: Emergency Medicine

## 2019-02-20 DIAGNOSIS — Z794 Long term (current) use of insulin: Secondary | ICD-10-CM | POA: Insufficient documentation

## 2019-02-20 DIAGNOSIS — B349 Viral infection, unspecified: Secondary | ICD-10-CM

## 2019-02-20 DIAGNOSIS — F17290 Nicotine dependence, other tobacco product, uncomplicated: Secondary | ICD-10-CM | POA: Insufficient documentation

## 2019-02-20 DIAGNOSIS — R112 Nausea with vomiting, unspecified: Secondary | ICD-10-CM | POA: Insufficient documentation

## 2019-02-20 DIAGNOSIS — Z20828 Contact with and (suspected) exposure to other viral communicable diseases: Secondary | ICD-10-CM | POA: Diagnosis not present

## 2019-02-20 DIAGNOSIS — Z79899 Other long term (current) drug therapy: Secondary | ICD-10-CM | POA: Insufficient documentation

## 2019-02-20 DIAGNOSIS — R509 Fever, unspecified: Secondary | ICD-10-CM | POA: Diagnosis present

## 2019-02-20 DIAGNOSIS — E119 Type 2 diabetes mellitus without complications: Secondary | ICD-10-CM | POA: Insufficient documentation

## 2019-02-20 DIAGNOSIS — R197 Diarrhea, unspecified: Secondary | ICD-10-CM | POA: Insufficient documentation

## 2019-02-20 DIAGNOSIS — I10 Essential (primary) hypertension: Secondary | ICD-10-CM | POA: Diagnosis not present

## 2019-02-20 LAB — COMPREHENSIVE METABOLIC PANEL
ALT: 19 U/L (ref 0–44)
AST: 14 U/L — ABNORMAL LOW (ref 15–41)
Albumin: 3.8 g/dL (ref 3.5–5.0)
Alkaline Phosphatase: 92 U/L (ref 38–126)
Anion gap: 7 (ref 5–15)
BUN: 21 mg/dL — ABNORMAL HIGH (ref 6–20)
CO2: 27 mmol/L (ref 22–32)
Calcium: 9.2 mg/dL (ref 8.9–10.3)
Chloride: 106 mmol/L (ref 98–111)
Creatinine, Ser: 0.97 mg/dL (ref 0.44–1.00)
GFR calc Af Amer: >60 mL/min (ref >60)
GFR calc non Af Amer: >60 mL/min (ref >60)
Glucose, Bld: 154 mg/dL — ABNORMAL HIGH (ref 70–99)
Potassium: 3.3 mmol/L — ABNORMAL LOW (ref 3.5–5.1)
Sodium: 140 mmol/L (ref 135–145)
Total Bilirubin: 0.5 mg/dL (ref 0.3–1.2)
Total Protein: 6.7 g/dL (ref 6.5–8.1)

## 2019-02-20 LAB — URINALYSIS, COMPLETE (UACMP) WITH MICROSCOPIC
Bacteria, UA: NONE SEEN
Bilirubin Urine: NEGATIVE
Glucose, UA: NEGATIVE mg/dL
Hgb urine dipstick: NEGATIVE
Ketones, ur: NEGATIVE mg/dL
Leukocytes,Ua: NEGATIVE
Nitrite: NEGATIVE
Protein, ur: NEGATIVE mg/dL
Specific Gravity, Urine: 1.024 (ref 1.005–1.030)
pH: 5 (ref 5.0–8.0)

## 2019-02-20 LAB — CBC
HCT: 39.7 % (ref 36.0–46.0)
Hemoglobin: 13.1 g/dL (ref 12.0–15.0)
MCH: 25.9 pg — ABNORMAL LOW (ref 26.0–34.0)
MCHC: 33 g/dL (ref 30.0–36.0)
MCV: 78.5 fL — ABNORMAL LOW (ref 80.0–100.0)
Platelets: 290 10*3/uL (ref 150–400)
RBC: 5.06 MIL/uL (ref 3.87–5.11)
RDW: 13.2 % (ref 11.5–15.5)
WBC: 7.3 10*3/uL (ref 4.0–10.5)
nRBC: 0 % (ref 0.0–0.2)

## 2019-02-20 LAB — LIPASE, BLOOD: Lipase: 35 U/L (ref 11–51)

## 2019-02-20 LAB — RESPIRATORY PANEL BY RT PCR (FLU A&B, COVID)
Influenza A by PCR: NEGATIVE
Influenza B by PCR: NEGATIVE
SARS Coronavirus 2 by RT PCR: NEGATIVE

## 2019-02-20 MED ORDER — SODIUM CHLORIDE 0.9% FLUSH
3.0000 mL | Freq: Once | INTRAVENOUS | Status: AC
  Administered 2019-02-20: 3 mL via INTRAVENOUS

## 2019-02-20 MED ORDER — LOPERAMIDE HCL 2 MG PO CAPS: 4.0000 mg | ORAL_CAPSULE | Freq: Three times a day (TID) | ORAL | 0 refills | Status: AC | PRN

## 2019-02-20 MED ORDER — SODIUM CHLORIDE 0.9 % IV BOLUS
1000.0000 mL | Freq: Once | INTRAVENOUS | Status: AC
  Administered 2019-02-20: 1000 mL via INTRAVENOUS

## 2019-02-20 MED ORDER — PROCHLORPERAZINE EDISYLATE 10 MG/2ML IJ SOLN
10.0000 mg | Freq: Once | INTRAMUSCULAR | Status: AC
  Administered 2019-02-20: 10 mg via INTRAVENOUS
  Filled 2019-02-20: qty 2

## 2019-02-20 MED ORDER — NAPROXEN 375 MG PO TABS: 375.0000 mg | ORAL_TABLET | Freq: Two times a day (BID) | ORAL | 0 refills | Status: AC | PRN

## 2019-02-20 MED ORDER — DIPHENHYDRAMINE HCL 50 MG/ML IJ SOLN
25.0000 mg | Freq: Once | INTRAMUSCULAR | Status: AC
  Administered 2019-02-20: 25 mg via INTRAVENOUS
  Filled 2019-02-20: qty 1

## 2019-02-20 MED ORDER — ONDANSETRON HCL 4 MG/2ML IJ SOLN
4.0000 mg | Freq: Once | INTRAMUSCULAR | Status: AC
  Administered 2019-02-20: 4 mg via INTRAVENOUS
  Filled 2019-02-20: qty 2

## 2019-02-20 MED ORDER — ONDANSETRON 4 MG PO TBDP: 4.0000 mg | ORAL_TABLET | Freq: Three times a day (TID) | ORAL | 0 refills | Status: DC | PRN

## 2019-02-20 NOTE — ED Notes (Signed)
See triage note  Presents with some weakness with n/v which started last pm  Last time vomited was last pm  Afebrile on arrival

## 2019-02-20 NOTE — Discharge Instructions (Signed)
As we discussed, your symptoms could be due to Coronavirus or other infectious Virus. Take 48 hours off work, at the least, until sx resolve and your tests are back. I've provided a work note.  Continue to drink plenty of fluids.  Try taking tylenol 500 mg every 4 hours for chills/fever  Take the prescribed medications as needed  If symptoms do not improve or worsen in 48 hours, return to the ER

## 2019-02-20 NOTE — ED Triage Notes (Signed)
Pt here with c/o vomiting, chills, headache and body aches since yesterday, NAD.

## 2019-02-20 NOTE — ED Provider Notes (Signed)
[facility] Emergency Department Provider Note  ____________________________________________   None    (approximate)  I have reviewed the triage vital signs and the nursing notes.   HISTORY  Chief Complaint Emesis, Chills, and Generalized Body Aches    HPI [name] is a 53 y.o. female  With h/o DM, HTN, migraines here with fever, chills nausea, vomiting. Pt reports sx started abruptly yesterday @ work. She has been having diarrhea x 2-3 days but then acutely felt hot, chilled, and had to sit down 2/2 lightheadedness. Shortly thereafter, she began having nausea, vomiting, and diarrhea. She has had diarrhea since then. Sx have been coming and going. She has multiple known sick contacts @ work with similar sx, but denies knowing if any are COVID+. Sx worse intermittently, no specific alleviating or aggravating factors. No cough, SOB. Mild HA though has a h/o migraines.          Past Medical History:  Diagnosis Date  . Diabetes mellitus without complication (HCC)   . Hypertension   . Migraines     Patient Active Problem List   Diagnosis Date Noted  . Lumbar facet joint syndrome 03/21/2018  . Chronic bilateral low back pain without sciatica 03/21/2018  . Chronic pain syndrome 03/21/2018  . Obesity, morbid, BMI 40.0-49.9 (HCC) 07/12/2017  . Bilateral occipital neuralgia 09/14/2016  . Neck pain 12/16/2014  . Migraines 04/28/2014  . Diabetes mellitus type 2, uncomplicated (HCC) 04/28/2014    Past Surgical History:  Procedure Laterality Date  . ABDOMINAL HYSTERECTOMY    . ANKLE SURGERY    . CESAREAN SECTION    . KNEE SURGERY    . WRIST SURGERY      Prior to Admission medications   Medication Sig Start Date End Date Taking? Authorizing Provider  atorvastatin (LIPITOR) 20 MG tablet Take 20 mg by mouth daily at 6 PM.    [provider]  cyclobenzaprine (FLEXERIL) 10 MG tablet Take 1 tablet (10 mg total) by mouth 3 (three) times  daily as needed for muscle spasms. 01/16/19   Evon SlackGaines, Thomas C, PA-C  glipiZIDE (GLUCOTROL) 5 MG tablet Take 5 mg by mouth daily before breakfast.    [provider]  insulin glargine (LANTUS) 100 UNIT/ML injection Inject 18 Units into the skin at bedtime.     [provider]  lidocaine (LIDODERM) 5 % Place 1 patch onto the skin daily. Remove & Discard patch within 12 hours or as directed by MD    [provider]  loperamide (IMODIUM) 2 MG capsule Take 2 capsules (4 mg total) by mouth 3 (three) times daily as needed for up to 3 days for diarrhea or loose stools. 02/20/19 02/23/19  Shaune PollackIsaacs, Collyn Selk, MD  magic mouthwash w/lidocaine SOLN Take 5 mLs by mouth 4 (four) times daily. 01/06/19   Cuthriell, Delorise RoyalsJonathan D, PA-C  meloxicam (MOBIC) 15 MG tablet Take 1 tablet (15 mg total) by mouth daily. 11/20/18 11/20/19  Fisher, Roselyn BeringSusan W, PA-C  metFORMIN (GLUCOPHAGE) 1000 MG tablet Take 1 tablet (1,000 mg total) by mouth 2 (two) times daily with a meal. Patient taking differently: Take 500 mg by mouth daily.  03/29/17 12/03/18  Emily FilbertWilliams, Jonathan E, MD  naproxen (NAPROSYN) 375 MG tablet Take 1 tablet (375 mg total) by mouth 2 (two) times daily as needed for up to 5 days for moderate pain or headache. 02/20/19 02/25/19  Shaune PollackIsaacs, Kaloni Bisaillon, MD  ondansetron (ZOFRAN ODT) 4 MG disintegrating tablet Take 1 tablet (4 mg total)  by mouth every 8 (eight) hours as needed for nausea or vomiting. 02/20/19   Shaune Pollack, MD  SUMAtriptan-naproxen (TREXIMET) 85-500 MG per tablet Take 1 tablet by mouth every 2 (two) hours as needed for migraine.    [provider]  topiramate (TOPAMAX) 100 MG tablet Take 100 mg by mouth 2 (two) times daily.    [provider]  triamterene-hydrochlorothiazide (MAXZIDE-25) 37.5-25 MG per tablet Take 1 tablet by mouth daily.    [provider]  verapamil (COVERA HS) 180 MG (CO) 24 hr tablet Take 180 mg by mouth at bedtime.    [provider]     Allergies Patient has no known allergies.  Family History  Problem Relation Age of Onset  . Breast cancer Neg Hx     Social History Social History   Tobacco Use  . Smoking status: Never Smoker  . Smokeless tobacco: Current User    Types: Snuff  Substance Use Topics  . Alcohol use: Yes    Comment: occasionally  . Drug use: No    Review of Systems  Review of Systems  Constitutional: Positive for chills and fatigue. Negative for fever.  HENT: Negative for congestion and sore throat.   Eyes: Negative for visual disturbance.  Respiratory: Negative for cough and shortness of breath.   Cardiovascular: Negative for chest pain.  Gastrointestinal: Positive for abdominal pain, nausea and vomiting. Negative for diarrhea.  Genitourinary: Negative for flank pain.  Musculoskeletal: Positive for arthralgias and myalgias. Negative for back pain and neck pain.  Skin: Negative for rash and wound.  Neurological: Positive for weakness.  All other systems reviewed and are negative.    ____________________________________________  PHYSICAL EXAM:      VITAL SIGNS: ED Triage Vitals  Enc Vitals Group     BP 02/20/19 0912 123/64     Pulse Rate 02/20/19 0912 62     Resp 02/20/19 0912 18     Temp 02/20/19 0912 98.2 F (36.8 C)     Temp Source 02/20/19 0912 Oral     SpO2 02/20/19 0912 100 %     Weight 02/20/19 0913 252 lb (114.3 kg)     Height 02/20/19 0913  (1.676 m)     Head Circumference --      Peak Flow --      Pain Score 02/20/19 0912 10     Pain Loc --      Pain Edu? --      Excl. in GC? --      Physical Exam Vitals and nursing note reviewed.  Constitutional:      General: She is not in acute distress.    Appearance: She is well-developed.  HENT:     Head: Normocephalic and atraumatic.     Mouth/Throat:     Mouth: Mucous membranes are dry.  Eyes:     Conjunctiva/sclera: Conjunctivae normal.  Cardiovascular:     Rate and Rhythm: Normal rate and regular  rhythm.     Heart sounds: Normal heart sounds. No murmur. No friction rub.  Pulmonary:     Effort: Pulmonary effort is normal. No respiratory distress.     Breath sounds: Normal breath sounds. No wheezing or rales.  Abdominal:     General: There is no distension.     Palpations: Abdomen is soft.     Tenderness: There is no abdominal tenderness.     Comments: No overt abd TTP  Musculoskeletal:     Cervical back: Neck supple.  Skin:    General: Skin is warm.     Capillary Refill: Capillary refill takes less than 2 seconds.  Neurological:     Mental Status: She is alert and oriented to person, place, and time.     Motor: No abnormal muscle tone.       ____________________________________________   LABS (all labs ordered are listed, but only abnormal results are displayed)  Labs Reviewed  COMPREHENSIVE METABOLIC PANEL - Abnormal; Notable for the following components:      Result Value   Potassium 3.3 (*)    Glucose, Bld 154 (*)    BUN 21 (*)    AST 14 (*)    All other components within normal limits  CBC - Abnormal; Notable for the following components:   MCV 78.5 (*)    MCH 25.9 (*)    All other components within normal limits  URINALYSIS, COMPLETE (UACMP) WITH MICROSCOPIC - Abnormal; Notable for the following components:   Color, Urine YELLOW (*)    APPearance HAZY (*)    All other components within normal limits  RESPIRATORY PANEL BY RT PCR (FLU A&B, COVID)  LIPASE, BLOOD    ____________________________________________  EKG: None ________________________________________  RADIOLOGY All imaging, including plain films, CT scans, and ultrasounds, independently reviewed by me, and interpretations confirmed via formal radiology reads.  ED MD interpretation:   None  Official radiology report(s): DG Chest Portable 1 View  Result Date: 02/20/2019 CLINICAL DATA:  Chills, nausea and vomiting. Possible COVID exposure. EXAM: PORTABLE CHEST 1 VIEW COMPARISON:   09/10/2018. FINDINGS: Mediastinum and hilar structures normal. Stable mild cardiomegaly. Low lung volumes with mild basilar atelectasis. No focal alveolar infiltrate. No pleural effusion or pneumothorax. IMPRESSION: One stable mild cardiomegaly. 2.  Low lung volumes with mild basilar atelectasis. Electronically Signed   By: Marcello Moores  Register   On: 02/20/2019 13:12    ____________________________________________  PROCEDURES   Procedure(s) performed (including Critical Care):  Procedures  ____________________________________________  INITIAL IMPRESSION / MDM / [place] / ED COURSE  As part of my medical decision making, I reviewed the following data within the [place] notes reviewed and incorporated, Old chart reviewed, Notes from prior ED visits, and [place] Controlled Substance Database       *Aneesah Hernan was evaluated in Emergency Department on 02/20/2019 for the symptoms described in the history of present illness. She was evaluated in the context of the global COVID-19 pandemic, which necessitated consideration that the patient might be at risk for infection with the SARS-CoV-2 virus that causes COVID-19. Institutional protocols and algorithms that pertain to the evaluation of patients at risk for COVID-19 are in a state of rapid change based on information released by regulatory bodies including the CDC and federal and state organizations. These policies and algorithms were followed during the patient's care in the ED.  Some ED evaluations and interventions may be delayed as a result of limited staffing during the pandemic.*     Medical Decision Making:  53 yo F here with nausea, vomiting, diarrhea, body aches. This occurs in the setting of multiple sick contacts at work with similar symptoms.  Highly suspect this is viral or otherwise infectious and etiology.  Her Covid and influenza testing is negative.  Lipase normal.  Electrolytes show likely  mild dehydration but are otherwise unremarkable with normal LFTs.  CBC unremarkable.  No evidence to suggest sepsis and she is afebrile hemodynamically stable here.  Her abdomen is completely soft, nontender,  and nondistended on exam with no focal tenderness to suggest appendicitis, cholecystitis or other acute intra-abdominal pathology.  The patient feels markedly improved after antiemetics and symptomatic treatment with fluids here.  Will have her take some time off of work, treat symptomatically, encourage fluids, and discharge home with good return precautions.  ____________________________________________  FINAL CLINICAL IMPRESSION(S) / ED DIAGNOSES  Final diagnoses:  Viral syndrome  Nausea vomiting and diarrhea     MEDICATIONS GIVEN DURING THIS VISIT:  Medications  sodium chloride flush (NS) 0.9 % injection 3 mL (3 mLs Intravenous Given by Other 02/20/19 1217)  sodium chloride 0.9 % bolus 1,000 mL (0 mLs Intravenous Stopped 02/20/19 1516)  ondansetron (ZOFRAN) injection 4 mg (4 mg Intravenous Given 02/20/19 1227)  prochlorperazine (COMPAZINE) injection 10 mg (10 mg Intravenous Given 02/20/19 1301)  diphenhydrAMINE (BENADRYL) injection 25 mg (25 mg Intravenous Given 02/20/19 1301)     ED Discharge Orders         Ordered    ondansetron (ZOFRAN ODT) 4 MG disintegrating tablet  Every 8 hours PRN     02/20/19 1448    naproxen (NAPROSYN) 375 MG tablet  2 times daily PRN     02/20/19 1448    loperamide (IMODIUM) 2 MG capsule  3 times daily PRN     02/20/19 1448           Note:  This document was prepared using Dragon voice recognition software and may include unintentional dictation errors.   Shaune Pollack, MD 02/20/19 [phone]

## 2019-02-23 ENCOUNTER — Telehealth: Payer: Self-pay

## 2019-02-23 NOTE — Telephone Encounter (Signed)
Received call from patient checking Covid results.  Advised results negative.   

## 2019-02-25 ENCOUNTER — Ambulatory Visit: Payer: BC Managed Care – PPO | Attending: Internal Medicine

## 2019-02-25 DIAGNOSIS — Z20822 Contact with and (suspected) exposure to covid-19: Secondary | ICD-10-CM

## 2019-02-27 LAB — NOVEL CORONAVIRUS, NAA: SARS-CoV-2, NAA: NOT DETECTED

## 2019-03-15 ENCOUNTER — Encounter: Payer: Self-pay | Admitting: Emergency Medicine

## 2019-03-15 ENCOUNTER — Other Ambulatory Visit: Payer: Self-pay

## 2019-03-15 ENCOUNTER — Emergency Department
Admission: EM | Admit: 2019-03-15 | Discharge: 2019-03-15 | Disposition: A | Discharge status: Home / Self Care | Payer: BC Managed Care – PPO | Attending: Emergency Medicine | Admitting: Emergency Medicine

## 2019-03-15 DIAGNOSIS — F17220 Nicotine dependence, chewing tobacco, uncomplicated: Secondary | ICD-10-CM | POA: Insufficient documentation

## 2019-03-15 DIAGNOSIS — Z79899 Other long term (current) drug therapy: Secondary | ICD-10-CM | POA: Insufficient documentation

## 2019-03-15 DIAGNOSIS — M5441 Lumbago with sciatica, right side: Secondary | ICD-10-CM

## 2019-03-15 DIAGNOSIS — E119 Type 2 diabetes mellitus without complications: Secondary | ICD-10-CM | POA: Insufficient documentation

## 2019-03-15 DIAGNOSIS — Z794 Long term (current) use of insulin: Secondary | ICD-10-CM | POA: Insufficient documentation

## 2019-03-15 DIAGNOSIS — I10 Essential (primary) hypertension: Secondary | ICD-10-CM | POA: Insufficient documentation

## 2019-03-15 MED ORDER — TRAMADOL HCL 50 MG PO TABS
50.0000 mg | ORAL_TABLET | Freq: Once | ORAL | Status: AC
  Administered 2019-03-15: 50 mg via ORAL
  Filled 2019-03-15: qty 1

## 2019-03-15 MED ORDER — DEXAMETHASONE SODIUM PHOSPHATE 10 MG/ML IJ SOLN
10.0000 mg | Freq: Once | INTRAMUSCULAR | Status: AC
  Administered 2019-03-15: 10:00:00 10 mg via INTRAMUSCULAR
  Filled 2019-03-15: qty 1

## 2019-03-15 MED ORDER — TRAMADOL HCL 50 MG PO TABS: 50.0000 mg | ORAL_TABLET | Freq: Four times a day (QID) | ORAL | 0 refills | Status: DC | PRN

## 2019-03-15 NOTE — Discharge Instructions (Signed)
Follow-up with your primary care provider if any continued problems and also for continued pain management.  You will also need to follow-up with the clinic that you were referred to.  You may use heat or ice to your back as needed for discomfort.  It may take several days before the sensation in your right leg improves.  Continue with your regular medication.

## 2019-03-15 NOTE — ED Provider Notes (Signed)
[facility] Emergency Department Provider Note  ____________________________________________   None    (approximate)  I have reviewed the triage vital signs and the nursing notes.   HISTORY  Chief Complaint Back Pain  HPI Michele [name] is a 54 y.o. female presents to the ED with complaint of low back pain.  Patient states that she has had problems with her back in the past and also has already had an MRI done which showed facet arthrosis and neural foraminal stenosis L4-L5.  Patient denies any new injury to her back.  She is here because her low back hurts with radiation into her right leg.  She states that she took a muscle relaxants which did not help.  She states that the shot that she received last time she was here helped more than anything else.  She denies any saddle anesthesias, incontinence of bowel or bladder.  She rates her pain as a 10/10.       Past Medical History:  Diagnosis Date  . Diabetes mellitus without complication ([place])   . Hypertension   . Migraines     Patient Active Problem List   Diagnosis Date Noted  . Lumbar facet joint syndrome 03/21/2018  . Chronic bilateral low back pain without sciatica 03/21/2018  . Chronic pain syndrome 03/21/2018  . Obesity, morbid, BMI 40.0-49.9 ([place]) 07/12/2017  . Bilateral occipital neuralgia 09/14/2016  . Neck pain 12/16/2014  . Migraines 04/28/2014  . Diabetes mellitus type 2, uncomplicated ([place]) 16/12/9602    Past Surgical History:  Procedure Laterality Date  . ABDOMINAL HYSTERECTOMY    . ANKLE SURGERY    . CESAREAN SECTION    . KNEE SURGERY    . WRIST SURGERY      Prior to Admission medications   Medication Sig Start Date End Date Taking? Authorizing Provider  atorvastatin (LIPITOR) 20 MG tablet Take 20 mg by mouth daily at 6 PM.    [provider]  cyclobenzaprine (FLEXERIL) 10 MG tablet Take 1 tablet (10 mg total) by mouth 3 (three) times daily as needed for muscle  spasms. 01/16/19   Duanne Guess, PA-C  glipiZIDE (GLUCOTROL) 5 MG tablet Take 5 mg by mouth daily before breakfast.    [provider]  insulin glargine (LANTUS) 100 UNIT/ML injection Inject 18 Units into the skin at bedtime.     [provider]  metFORMIN (GLUCOPHAGE) 1000 MG tablet Take 1 tablet (1,000 mg total) by mouth 2 (two) times daily with a meal. Patient taking differently: Take 500 mg by mouth daily.  03/29/17 12/03/18  Earleen Newport, MD  ondansetron (ZOFRAN ODT) 4 MG disintegrating tablet Take 1 tablet (4 mg total) by mouth every 8 (eight) hours as needed for nausea or vomiting. 02/20/19   Duffy Bruce, MD  SUMAtriptan-naproxen (TREXIMET) 85-500 MG per tablet Take 1 tablet by mouth every 2 (two) hours as needed for migraine.    [provider]  topiramate (TOPAMAX) 100 MG tablet Take 100 mg by mouth 2 (two) times daily.    [provider]  traMADol (ULTRAM) 50 MG tablet Take 1 tablet (50 mg total) by mouth every 6 (six) hours as needed. 03/15/19   Johnn Hai, PA-C  triamterene-hydrochlorothiazide (MAXZIDE-25) 37.5-25 MG per tablet Take 1 tablet by mouth daily.    [provider]  verapamil (COVERA HS) 180 MG (CO) 24 hr tablet Take 180 mg by mouth at bedtime.    [provider]  Allergies Patient has no known allergies.  Family History  Problem Relation Age of Onset  . Breast cancer Neg Hx     Social History Social History   Tobacco Use  . Smoking status: Never Smoker  . Smokeless tobacco: Current User    Types: Snuff  Substance Use Topics  . Alcohol use: Yes    Comment: occasionally  . Drug use: No    Review of Systems Constitutional: No fever/chills Cardiovascular: Denies chest pain. Respiratory: Denies shortness of breath. Genitourinary: Negative for dysuria. Musculoskeletal: Positive for low back pain with right leg radiculopathy. Skin: Negative for rash. Neurological: Negative for  headaches, focal weakness or numbness. ____________________________________________   PHYSICAL EXAM:  VITAL SIGNS: ED Triage Vitals  Enc Vitals Group     BP 03/15/19 0815 133/66     Pulse Rate 03/15/19 0815 72     Resp 03/15/19 0815 16     Temp 03/15/19 0815 97.6 F (36.4 C)     Temp Source 03/15/19 0815 Oral     SpO2 03/15/19 0815 100 %     Weight 03/15/19 0814 251 lb 5.2 oz (114 kg)     Height 03/15/19 0814 5\' 6"  (1.676 m)     Head Circumference --      Peak Flow --      Pain Score 03/15/19 0813 10     Pain Loc --      Pain Edu? --      Excl. in GC? --     Constitutional: Alert and oriented. Well appearing and in no acute distress. Eyes: Conjunctivae are normal.  Head: Atraumatic. Neck: No stridor.   Cardiovascular: Normal rate, regular rhythm. Grossly normal heart sounds.  Good peripheral circulation. Respiratory: Normal respiratory effort.  No retractions. Lungs CTAB. Gastrointestinal: Soft and nontender. No distention.  Musculoskeletal: On examination of the low back there is no gross deformity however there is tenderness on palpation of the lumbar spine and right SI joint area.  Range of motion is restricted secondary to patient's discomfort.  No evidence of trauma or skin discoloration noted in this area.  Good muscle strength bilaterally. Neurologic:  Normal speech and language. No gross focal neurologic deficits are appreciated.  Flexes 1+ bilaterally.  No gait instability. Skin:  Skin is warm, dry and intact. No rash noted. Psychiatric: Mood and affect are normal. Speech and behavior are normal.  ____________________________________________   LABS (all labs ordered are listed, but only abnormal results are displayed)  Labs Reviewed - No data to display  PROCEDURES  Procedure(s) performed (including Critical Care):  Procedures  ____________________________________________   INITIAL IMPRESSION / ASSESSMENT AND PLAN / ED COURSE  As part of my medical  decision making, I reviewed the following data within the electronic MEDICAL RECORD NUMBER Notes from prior ED visits and [place] Controlled Substance Database Michele [name] was evaluated in Emergency Department on 03/15/2019 for the symptoms described in the history of present illness. She was evaluated in the context of the global COVID-19 pandemic, which necessitated consideration that the patient might be at risk for infection with the SARS-CoV-2 virus that causes COVID-19. Institutional protocols and algorithms that pertain to the evaluation of patients at risk for COVID-19 are in a state of rapid change based on information released by regulatory bodies including the CDC and federal and state organizations. These policies and algorithms were followed during the patient's care in the ED.  54 year old female presents to the ED with complaint of low back pain with  right leg radiculopathy.  Patient has a history of the same and states that she is currently waiting for her "insurance" to start so that she can be seen by a specialist.  MRI report was reviewed has been seen in the ED for the same complaint.  She states that currently the muscle relaxant and the shot that she was given while in the ED helped more than any over-the-counter medication or the prednisone that she was given last time.  Patient was given Decadron 10 mg IM and a prescription for tramadol was sent to her pharmacy.  Patient will continue with her muscle relaxants.  She is encouraged to use ice or heat to her back as needed for discomfort and to follow-up with her PCP if any continued problems. ____________________________________________   FINAL CLINICAL IMPRESSION(S) / ED DIAGNOSES  Final diagnoses:  Bilateral low back pain with right-sided sciatica, unspecified chronicity     ED Discharge Orders         Ordered    traMADol (ULTRAM) 50 MG tablet  Every 6 hours PRN     03/15/19 1035           Note:  This document was prepared  using Dragon voice recognition software and may include unintentional dictation errors.    Tommi Rumps, PA-C 03/15/19 1105    Minna Antis, MD 03/15/19 1520

## 2019-03-15 NOTE — ED Notes (Signed)
See triage note  Presents with lower back pain  States pain is moving into right leg  States pain started about 1 week ago  Denies any injury or urinary sxs'

## 2019-03-15 NOTE — ED Triage Notes (Signed)
Pt to ED via POV c/o lower back pain x 1 week. Pt states that last night pain increased and is going down her leg. Pt states that she was unable to sleep last night due to the pain. Pt is in NAD.

## 2019-03-18 ENCOUNTER — Emergency Department
Admission: EM | Admit: 2019-03-18 | Discharge: 2019-03-18 | Disposition: A | Discharge status: Home / Self Care | Payer: BC Managed Care – PPO | Attending: Emergency Medicine | Admitting: Emergency Medicine

## 2019-03-18 ENCOUNTER — Other Ambulatory Visit: Payer: Self-pay

## 2019-03-18 ENCOUNTER — Emergency Department: Payer: BC Managed Care – PPO

## 2019-03-18 ENCOUNTER — Encounter: Payer: Self-pay | Admitting: Emergency Medicine

## 2019-03-18 DIAGNOSIS — E119 Type 2 diabetes mellitus without complications: Secondary | ICD-10-CM | POA: Insufficient documentation

## 2019-03-18 DIAGNOSIS — Z79899 Other long term (current) drug therapy: Secondary | ICD-10-CM | POA: Diagnosis not present

## 2019-03-18 DIAGNOSIS — K529 Noninfective gastroenteritis and colitis, unspecified: Secondary | ICD-10-CM | POA: Diagnosis not present

## 2019-03-18 DIAGNOSIS — E860 Dehydration: Secondary | ICD-10-CM | POA: Diagnosis not present

## 2019-03-18 DIAGNOSIS — F17220 Nicotine dependence, chewing tobacco, uncomplicated: Secondary | ICD-10-CM | POA: Diagnosis not present

## 2019-03-18 DIAGNOSIS — R079 Chest pain, unspecified: Secondary | ICD-10-CM | POA: Diagnosis present

## 2019-03-18 DIAGNOSIS — B349 Viral infection, unspecified: Secondary | ICD-10-CM | POA: Insufficient documentation

## 2019-03-18 DIAGNOSIS — Z794 Long term (current) use of insulin: Secondary | ICD-10-CM | POA: Insufficient documentation

## 2019-03-18 DIAGNOSIS — I10 Essential (primary) hypertension: Secondary | ICD-10-CM | POA: Insufficient documentation

## 2019-03-18 LAB — CBC
HCT: 44.4 % (ref 36.0–46.0)
Hemoglobin: 14.4 g/dL (ref 12.0–15.0)
MCH: 26.1 pg (ref 26.0–34.0)
MCHC: 32.4 g/dL (ref 30.0–36.0)
MCV: 80.6 fL (ref 80.0–100.0)
Platelets: 317 10*3/uL (ref 150–400)
RBC: 5.51 MIL/uL — ABNORMAL HIGH (ref 3.87–5.11)
RDW: 12.9 % (ref 11.5–15.5)
WBC: 8.4 10*3/uL (ref 4.0–10.5)
nRBC: 0 % (ref 0.0–0.2)

## 2019-03-18 LAB — TROPONIN I (HIGH SENSITIVITY): Troponin I (High Sensitivity): <2 ng/L (ref <18)

## 2019-03-18 LAB — BASIC METABOLIC PANEL
Anion gap: 12 (ref 5–15)
BUN: 16 mg/dL (ref 6–20)
CO2: 24 mmol/L (ref 22–32)
Calcium: 9.4 mg/dL (ref 8.9–10.3)
Chloride: 102 mmol/L (ref 98–111)
Creatinine, Ser: 1.02 mg/dL — ABNORMAL HIGH (ref 0.44–1.00)
GFR calc Af Amer: >60 mL/min (ref >60)
GFR calc non Af Amer: >60 mL/min (ref >60)
Glucose, Bld: 177 mg/dL — ABNORMAL HIGH (ref 70–99)
Potassium: 3.4 mmol/L — ABNORMAL LOW (ref 3.5–5.1)
Sodium: 138 mmol/L (ref 135–145)

## 2019-03-18 MED ORDER — ONDANSETRON 4 MG PO TBDP: 4.0000 mg | ORAL_TABLET | Freq: Four times a day (QID) | ORAL | 0 refills | Status: DC | PRN

## 2019-03-18 MED ORDER — ONDANSETRON HCL 4 MG/2ML IJ SOLN
4.0000 mg | Freq: Once | INTRAMUSCULAR | Status: AC
  Administered 2019-03-18: 16:00:00 4 mg via INTRAVENOUS
  Filled 2019-03-18: qty 2

## 2019-03-18 MED ORDER — SODIUM CHLORIDE 0.9 % IV BOLUS
1000.0000 mL | Freq: Once | INTRAVENOUS | Status: AC
  Administered 2019-03-18: 16:00:00 1000 mL via INTRAVENOUS

## 2019-03-18 NOTE — ED Triage Notes (Signed)
Pt reports has been exposed to covid and started with dizziness, SOB, Cp and chills today.

## 2019-03-18 NOTE — ED Provider Notes (Signed)
[facility] Emergency Department Provider Note   ____________________________________________   First MD Initiated Contact with Patient 03/18/19 1445     (approximate)  I have reviewed the triage vital signs and the nursing notes.   HISTORY  Chief Complaint Dizziness, Chest Pain, Shortness of Breath, and Chills    HPI Michele [name] is a 54 y.o. female here for evaluation of feeling lightheaded today  Patient reports that she had a Covid exposure over the weekend, she spent time with someone that had tested positive but did not tell her, she spent time at their home and also riding in the car with them  Patient reports that 2 days ago she started noticing loose diarrhea.  She is having watery stools a couple times a day.  Yesterday she felt like her mouth was dry, she ate and has not been throwing up or having abdominal pain but every time she eats something just feels like she has a watery stool.  She also has been having a mild headache as well, and she noticed today when she stands up she feels lightheaded.  She has had vertigo in the past that does not feel like a spinning room feeling but feels more like a lightheadedness when she is up and about  Slight dry cough.  Slight achiness in her chest.  She went and had a Covid test taken today, and came here to be evaluated for feeling lightheaded especially with standing  She did have significant exposure was riding in a car with someone who tested positive for Covid over the weekend     Past Medical History:  Diagnosis Date  . Diabetes mellitus without complication ([place])   . Hypertension   . Migraines     Patient Active Problem List   Diagnosis Date Noted  . Lumbar facet joint syndrome 03/21/2018  . Chronic bilateral low back pain without sciatica 03/21/2018  . Chronic pain syndrome 03/21/2018  . Obesity, morbid, BMI 40.0-49.9 ([place]) 07/12/2017  . Bilateral occipital neuralgia 09/14/2016  .  Neck pain 12/16/2014  . Migraines 04/28/2014  . Diabetes mellitus type 2, uncomplicated ([place]) 44/31/5400    Past Surgical History:  Procedure Laterality Date  . ABDOMINAL HYSTERECTOMY    . ANKLE SURGERY    . CESAREAN SECTION    . KNEE SURGERY    . WRIST SURGERY      Prior to Admission medications   Medication Sig Start Date End Date Taking? Authorizing Provider  atorvastatin (LIPITOR) 20 MG tablet Take 20 mg by mouth daily at 6 PM.    [provider]  cyclobenzaprine (FLEXERIL) 10 MG tablet Take 1 tablet (10 mg total) by mouth 3 (three) times daily as needed for muscle spasms. 01/16/19   Duanne Guess, PA-C  glipiZIDE (GLUCOTROL) 5 MG tablet Take 5 mg by mouth daily before breakfast.    [provider]  insulin glargine (LANTUS) 100 UNIT/ML injection Inject 18 Units into the skin at bedtime.     [provider]  metFORMIN (GLUCOPHAGE) 1000 MG tablet Take 1 tablet (1,000 mg total) by mouth 2 (two) times daily with a meal. Patient taking differently: Take 500 mg by mouth daily.  03/29/17 12/03/18  Earleen Newport, MD  ondansetron (ZOFRAN ODT) 4 MG disintegrating tablet Take 1 tablet (4 mg total) by mouth every 6 (six) hours as needed for nausea or vomiting. 03/18/19   Delman Kitten, MD  SUMAtriptan-naproxen (TREXIMET) 85-500 MG per tablet Take 1 tablet  by mouth every 2 (two) hours as needed for migraine.    [provider]  topiramate (TOPAMAX) 100 MG tablet Take 100 mg by mouth 2 (two) times daily.    [provider]  traMADol (ULTRAM) 50 MG tablet Take 1 tablet (50 mg total) by mouth every 6 (six) hours as needed. 03/15/19   Tommi Rumps, PA-C  triamterene-hydrochlorothiazide (MAXZIDE-25) 37.5-25 MG per tablet Take 1 tablet by mouth daily.    [provider]  verapamil (COVERA HS) 180 MG (CO) 24 hr tablet Take 180 mg by mouth at bedtime.    [provider]    Allergies Patient has no known allergies.  Family  History  Problem Relation Age of Onset  . Breast cancer Neg Hx     Social History Social History   Tobacco Use  . Smoking status: Never Smoker  . Smokeless tobacco: Current User    Types: Snuff  Substance Use Topics  . Alcohol use: Yes    Comment: occasionally  . Drug use: No    Review of Systems Constitutional: No fever/chills some fatigue Eyes: No visual changes. ENT: No sore throat.  Dry mouth Cardiovascular: Some achy discomfort Respiratory: Denies shortness of breath.  Dry cough Gastrointestinal: No abdominal pain.  Loose stool Genitourinary: Negative for dysuria. Musculoskeletal: Negative for back pain. Skin: Negative for rash. Neurological: Negative for headaches, areas of focal weakness or numbness.    ____________________________________________   PHYSICAL EXAM:  VITAL SIGNS: ED Triage Vitals  Enc Vitals Group     BP 03/18/19 1302 (!) 120/93     Pulse Rate 03/18/19 1302 82     Resp 03/18/19 1302 18     Temp 03/18/19 1302 98.3 F (36.8 C)     Temp Source 03/18/19 1302 Oral     SpO2 03/18/19 1302 100 %     Weight 03/18/19 1231 247 lb (112 kg)     Height 03/18/19 1231 5\' 6"  (1.676 m)     Head Circumference --      Peak Flow --      Pain Score 03/18/19 1231 8     Pain Loc --      Pain Edu? --      Excl. in [place]? --     Constitutional: Alert and oriented. Well appearing and in no acute distress.  Very pleasant Eyes: Conjunctivae are normal. Head: Atraumatic. Nose: No congestion/rhinnorhea. Mouth/Throat: Mucous membranes are moderately dry. Neck: No stridor.  Cardiovascular: Normal rate, regular rhythm. Grossly normal heart sounds.  Good peripheral circulation. Respiratory: Normal respiratory effort.  No retractions. Lungs CTAB. Gastrointestinal: Soft and nontender. No distention.  No rebound guarding or tenderness in any quadrant. Musculoskeletal: No lower extremity tenderness nor edema. Neurologic:  Normal speech and language. No gross focal  neurologic deficits are appreciated.  Skin:  Skin is warm, dry and intact. No rash noted. Psychiatric: Mood and affect are normal. Speech and behavior are normal.  ____________________________________________   LABS (all labs ordered are listed, but only abnormal results are displayed)  Labs Reviewed  BASIC METABOLIC PANEL - Abnormal; Notable for the following components:      Result Value   Potassium 3.4 (*)    Glucose, Bld 177 (*)    Creatinine, Ser 1.02 (*)    All other components within normal limits  CBC - Abnormal; Notable for the following components:   RBC 5.51 (*)    All other components within normal limits  TROPONIN I (HIGH SENSITIVITY)  ____________________________________________  EKG  ED ECG REPORT I, Sharyn Creamer, the attending physician, personally viewed and interpreted this ECG.  Date: 03/18/2019 EKG Time: 1310 Rate: 80 Rhythm: normal sinus rhythm QRS Axis: normal Intervals: normal ST/T Wave abnormalities: normal Narrative Interpretation: no evidence of acute ischemia  ____________________________________________  RADIOLOGY  DG Chest 2 View  Result Date: 03/18/2019 CLINICAL DATA:  Chest pain. Additional history provided: Patient reports being exposed to COVID, dizziness, shortness of breath, chest pain and chills. EXAM: CHEST - 2 VIEW COMPARISON:  Chest radiograph 02/20/2019 FINDINGS: Mild cardiomegaly, unchanged. Shallow inspiration radiograph with mild bibasilar atelectasis. No evidence of airspace consolidation. No pleural effusion or pneumothorax. No acute bony abnormality. Thoracic spondylosis IMPRESSION: Shallow inspiration radiograph with mild bibasilar atelectasis. No evidence of airspace consolidation. Unchanged cardiomegaly. Electronically Signed   By: Jackey Loge DO   On: 03/18/2019 13:14    Chest x-ray results negative for acute  mild atelectasis noted ____________________________________________   PROCEDURES  Procedure(s) performed:  None  Procedures  Critical Care performed: No  ____________________________________________   INITIAL IMPRESSION / ASSESSMENT AND PLAN / ED COURSE  Pertinent labs & imaging results that were available during my care of the patient were reviewed by me and considered in my medical decision making (see chart for details).   Patient presents for evaluation of lightheadedness especially with standing.  She appears to be having some loose watery stools, reassuring clinical examination and lab work.  No evidence of acute abdomen or focal abdominal pain.  She does report any obvious Covid exposure to a known positive patient with close contact while riding in a car with the patient while they were positive.  Her symptoms are suspicious for possible early Covid or viral-like or other self-limited illness at this time.  I do not see evidence of acute bacterial infection or acute abdomen to suggest need for advanced imaging studies such as CT scan.  She reports achy chest discomfort and her troponin and EKG are very reassuring no evidence of ACS.  Not consistent with ACS  Discussed with the patient, will hydrate with IV fluids, provide antiemetic, and food for her and reassess.  I suspect likely mild dehydration associated with a viral illness most likely, patient agreeable with this plan.  She does understand to quarantine at home until her results of her Covid test that she had sent earlier today return  Clinical Course as of Mar 17 1857  Wed Mar 18, 2019  1741 Patient reevaluation completed, she is resting at this time.  Watching television.  Would like something to eat, just ate some crackers but would place like to try something else.  We will give additional diet   [MQ]    Clinical Course User Index [MQ] Sharyn Creamer, MD   Patient resting comfortably.  Appears improved in no distress.  Stable, appropriate for ongoing outpatient follow-up with return precautions  Return precautions and  treatment recommendations and follow-up discussed with the patient who is agreeable with the plan.    ____________________________________________   FINAL CLINICAL IMPRESSION(S) / ED DIAGNOSES  Final diagnoses:  Viral illness  Colitis  Dehydration, mild        Note:  This document was prepared using Dragon voice recognition software and may include unintentional dictation errors       Sharyn Creamer, MD 03/18/19 1900

## 2019-05-13 ENCOUNTER — Emergency Department
Admission: EM | Admit: 2019-05-13 | Discharge: 2019-05-13 | Disposition: A | Discharge status: Home / Self Care | Payer: Self-pay | Attending: Emergency Medicine | Admitting: Emergency Medicine

## 2019-05-13 ENCOUNTER — Emergency Department: Payer: Self-pay

## 2019-05-13 ENCOUNTER — Encounter: Payer: Self-pay | Admitting: Emergency Medicine

## 2019-05-13 ENCOUNTER — Other Ambulatory Visit: Payer: Self-pay

## 2019-05-13 DIAGNOSIS — Z794 Long term (current) use of insulin: Secondary | ICD-10-CM | POA: Insufficient documentation

## 2019-05-13 DIAGNOSIS — E119 Type 2 diabetes mellitus without complications: Secondary | ICD-10-CM | POA: Insufficient documentation

## 2019-05-13 DIAGNOSIS — Z79899 Other long term (current) drug therapy: Secondary | ICD-10-CM | POA: Insufficient documentation

## 2019-05-13 DIAGNOSIS — K219 Gastro-esophageal reflux disease without esophagitis: Secondary | ICD-10-CM | POA: Insufficient documentation

## 2019-05-13 DIAGNOSIS — F17290 Nicotine dependence, other tobacco product, uncomplicated: Secondary | ICD-10-CM | POA: Insufficient documentation

## 2019-05-13 DIAGNOSIS — K2100 Gastro-esophageal reflux disease with esophagitis, without bleeding: Secondary | ICD-10-CM

## 2019-05-13 DIAGNOSIS — I10 Essential (primary) hypertension: Secondary | ICD-10-CM | POA: Insufficient documentation

## 2019-05-13 DIAGNOSIS — R0789 Other chest pain: Secondary | ICD-10-CM

## 2019-05-13 LAB — BASIC METABOLIC PANEL
Anion gap: 6 (ref 5–15)
BUN: 18 mg/dL (ref 6–20)
CO2: 26 mmol/L (ref 22–32)
Calcium: 9.3 mg/dL (ref 8.9–10.3)
Chloride: 105 mmol/L (ref 98–111)
Creatinine, Ser: 0.97 mg/dL (ref 0.44–1.00)
GFR calc Af Amer: >60 mL/min (ref >60)
GFR calc non Af Amer: >60 mL/min (ref >60)
Glucose, Bld: 287 mg/dL — ABNORMAL HIGH (ref 70–99)
Potassium: 3.7 mmol/L (ref 3.5–5.1)
Sodium: 137 mmol/L (ref 135–145)

## 2019-05-13 LAB — CBC
HCT: 40.4 % (ref 36.0–46.0)
Hemoglobin: 13.1 g/dL (ref 12.0–15.0)
MCH: 26.2 pg (ref 26.0–34.0)
MCHC: 32.4 g/dL (ref 30.0–36.0)
MCV: 80.8 fL (ref 80.0–100.0)
Platelets: 289 10*3/uL (ref 150–400)
RBC: 5 MIL/uL (ref 3.87–5.11)
RDW: 12.8 % (ref 11.5–15.5)
WBC: 7.7 10*3/uL (ref 4.0–10.5)
nRBC: 0 % (ref 0.0–0.2)

## 2019-05-13 LAB — TROPONIN I (HIGH SENSITIVITY): Troponin I (High Sensitivity): <2 ng/L (ref <18)

## 2019-05-13 MED ORDER — PANTOPRAZOLE SODIUM 20 MG PO TBEC: 20.0000 mg | DELAYED_RELEASE_TABLET | Freq: Every day | ORAL | 1 refills | Status: AC

## 2019-05-13 MED ORDER — SODIUM CHLORIDE 0.9% FLUSH: 3.0000 mL | Freq: Once | INTRAVENOUS | Status: DC

## 2019-05-13 MED ORDER — IOHEXOL 350 MG/ML SOLN
75.0000 mL | Freq: Once | INTRAVENOUS | Status: AC | PRN
  Administered 2019-05-13: 75 mL via INTRAVENOUS

## 2019-05-13 MED ORDER — ALUM & MAG HYDROXIDE-SIMETH 200-200-20 MG/5ML PO SUSP
30.0000 mL | Freq: Once | ORAL | Status: AC
  Administered 2019-05-13: 30 mL via ORAL
  Filled 2019-05-13: qty 30

## 2019-05-13 MED ORDER — LIDOCAINE VISCOUS HCL 2 % MT SOLN
15.0000 mL | Freq: Once | OROMUCOSAL | Status: AC
  Administered 2019-05-13: 15 mL via ORAL
  Filled 2019-05-13: qty 15

## 2019-05-13 MED ORDER — SUCRALFATE 1 G PO TABS: 1.0000 g | ORAL_TABLET | Freq: Four times a day (QID) | ORAL | 0 refills | Status: DC

## 2019-05-13 MED ORDER — HYDROCODONE-ACETAMINOPHEN 5-325 MG PO TABS
1.0000 | ORAL_TABLET | Freq: Once | ORAL | Status: AC
  Administered 2019-05-13: 1 via ORAL
  Filled 2019-05-13: qty 1

## 2019-05-13 NOTE — ED Triage Notes (Addendum)
Pt presents to ED via POV with c/o substernal CP and SOB x 3 days. Pt states CP radiates down her R arm upon arrival to ED.   Pt states pain worse with movement.

## 2019-05-13 NOTE — ED Notes (Signed)
Pt signed physical copy of discharge paperwork and papers sent to records.

## 2019-05-13 NOTE — ED Provider Notes (Signed)
[facility] Emergency Department Provider Note   ____________________________________________    I have reviewed the triage vital signs and the nursing notes.   HISTORY  Chief Complaint Chest Pain     HPI Michele [name] is a 54 y.o. female with a History of diabetes and hypertension who presents with complaints of chest pain.  Patient reports 3 to 4 days of central chest discomfort.  She initially attributed this to acid reflux and tried some Alka-Seltzer with little improvement.  She denies shortness of breath.  Sometimes moving seems to make the pain worse.  Denies history of heart disease.  No fevers chills or cough.  No nausea or vomiting.  Past Medical History:  Diagnosis Date  . Diabetes mellitus without complication ([facility])   . Hypertension   . Migraines     Patient Active Problem List   Diagnosis Date Noted  . Lumbar facet joint syndrome 03/21/2018  . Chronic bilateral low back pain without sciatica 03/21/2018  . Chronic pain syndrome 03/21/2018  . Obesity, morbid, BMI 40.0-49.9 ([facility]) 07/12/2017  . Bilateral occipital neuralgia 09/14/2016  . Neck pain 12/16/2014  . Migraines 04/28/2014  . Diabetes mellitus type 2, uncomplicated ([facility]) 04/28/2014    Past Surgical History:  Procedure Laterality Date  . ABDOMINAL HYSTERECTOMY    . ANKLE SURGERY    . CESAREAN SECTION    . KNEE SURGERY    . WRIST SURGERY      Prior to Admission medications   Medication Sig Start Date End Date Taking? Authorizing Provider  atorvastatin (LIPITOR) 20 MG tablet Take 20 mg by mouth daily at 6 PM.    [provider]  cyclobenzaprine (FLEXERIL) 10 MG tablet Take 1 tablet (10 mg total) by mouth 3 (three) times daily as needed for muscle spasms. 01/16/19   Evon Slack, PA-C  glipiZIDE (GLUCOTROL) 5 MG tablet Take 5 mg by mouth daily before breakfast.    [provider]  insulin glargine (LANTUS) 100 UNIT/ML injection Inject 18 Units  into the skin at bedtime.     [provider]  metFORMIN (GLUCOPHAGE) 1000 MG tablet Take 1 tablet (1,000 mg total) by mouth 2 (two) times daily with a meal. Patient taking differently: Take 500 mg by mouth daily.  03/29/17 12/03/18  Emily Filbert, MD  ondansetron (ZOFRAN ODT) 4 MG disintegrating tablet Take 1 tablet (4 mg total) by mouth every 6 (six) hours as needed for nausea or vomiting. 03/18/19   Sharyn Creamer, MD  pantoprazole (PROTONIX) 20 MG tablet Take 1 tablet (20 mg total) by mouth daily. 05/13/19 05/12/20  Jene Every, MD  sucralfate (CARAFATE) 1 g tablet Take 1 tablet (1 g total) by mouth 4 (four) times daily for 15 days. 05/13/19 05/28/19  Jene Every, MD  SUMAtriptan-naproxen (TREXIMET) 85-500 MG per tablet Take 1 tablet by mouth every 2 (two) hours as needed for migraine.    [provider]  topiramate (TOPAMAX) 100 MG tablet Take 100 mg by mouth 2 (two) times daily.    [provider]  traMADol (ULTRAM) 50 MG tablet Take 1 tablet (50 mg total) by mouth every 6 (six) hours as needed. 03/15/19   Tommi Rumps, PA-C  triamterene-hydrochlorothiazide (MAXZIDE-25) 37.5-25 MG per tablet Take 1 tablet by mouth daily.    [provider]  verapamil (COVERA HS) 180 MG (CO) 24 hr tablet Take 180 mg by mouth at bedtime.    [provider]  Allergies Patient has no known allergies.  Family History  Problem Relation Age of Onset  . Breast cancer Neg Hx     Social History Social History   Tobacco Use  . Smoking status: Never Smoker  . Smokeless tobacco: Current User    Types: Snuff  Substance Use Topics  . Alcohol use: Yes    Comment: occasionally  . Drug use: No    Review of Systems  Constitutional: No fever/chills Eyes: No visual changes.  ENT: No sore throat. Cardiovascular: As above Respiratory: Denies shortness of breath. Gastrointestinal: No abdominal pain.  No nausea, no vomiting.   Genitourinary: Negative for  dysuria. Musculoskeletal: Negative for back pain. Skin: Negative for rash. Neurological: Negative for headaches    ____________________________________________   PHYSICAL EXAM:  VITAL SIGNS: ED Triage Vitals  Enc Vitals Group     BP 05/13/19 1211 138/87     Pulse Rate 05/13/19 1211 86     Resp 05/13/19 1211 18     Temp 05/13/19 1211 98.6 F (37 C)     Temp Source 05/13/19 1211 Oral     SpO2 05/13/19 1211 98 %     Weight --      Height --      Head Circumference --      Peak Flow --      Pain Score 05/13/19 1209 10     Pain Loc --      Pain Edu? --      Excl. in [place]? --     Constitutional: Alert and oriented.   Nose: No congestion/rhinnorhea. Mouth/Throat: Mucous membranes are moist.   Neck:  Painless ROM Cardiovascular: Normal rate, regular rhythm. Grossly normal heart sounds.  Good peripheral circulation. Respiratory: Normal respiratory effort.  No retractions. Lungs CTAB. Gastrointestinal: Soft and nontender. No distention.  No CVA tenderness.  Musculoskeletal:   Warm and well perfused Neurologic:  Normal speech and language. No gross focal neurologic deficits are appreciated.  Skin:  Skin is warm, dry and intact. No rash noted. Psychiatric: Mood and affect are normal. Speech and behavior are normal.  ____________________________________________   LABS (all labs ordered are listed, but only abnormal results are displayed)  Labs Reviewed  BASIC METABOLIC PANEL - Abnormal; Notable for the following components:      Result Value   Glucose, Bld 287 (*)    All other components within normal limits  CBC  TROPONIN I (HIGH SENSITIVITY)   ____________________________________________  EKG  ED ECG REPORT I, Lavonia Drafts, the attending physician, personally viewed and interpreted this ECG.  Date: 05/13/2019  Rhythm: normal sinus rhythm QRS Axis: normal Intervals: normal ST/T Wave abnormalities: normal Narrative Interpretation: no evidence of acute  ischemia  ____________________________________________  RADIOLOGY  Chest x-ray unremarkable ____________________________________________   PROCEDURES  Procedure(s) performed: No  Procedures   Critical Care performed: No ____________________________________________   INITIAL IMPRESSION / ASSESSMENT AND PLAN / ED COURSE  Pertinent labs & imaging results that were available during my care of the patient were reviewed by me and considered in my medical decision making (see chart for details).  Patient well-appearing overall with reassuring vitals.  EKG is normal.  Troponin is undetectable.  Doubt ACS given the above and the fact that symptoms of been ongoing for several days.  Will trial GI cocktail given her description, certainly GERD is a possibility.  Patient had significant improvement with GI cocktail however continues to describe some chest discomfort with radiation to the back.  Given this sent  for CT angiography of the chest which was normal.  I suspect patient is suffering from esophagitis secondary to GERD, will start her on Protonix, Carafate, strict return precautions discussed, outpatient follow-up recommended     ____________________________________________   FINAL CLINICAL IMPRESSION(S) / ED DIAGNOSES  Final diagnoses:  Atypical chest pain  Gastroesophageal reflux disease with esophagitis without hemorrhage        Note:  This document was prepared using Dragon voice recognition software and may include unintentional dictation errors.   Jene Every, MD 05/13/19 1940

## 2019-05-13 NOTE — ED Notes (Signed)
Pt transported to CT ?

## 2019-05-18 ENCOUNTER — Ambulatory Visit: Payer: Self-pay | Admitting: Cardiology

## 2019-05-19 ENCOUNTER — Ambulatory Visit (INDEPENDENT_AMBULATORY_CARE_PROVIDER_SITE_OTHER): Payer: Self-pay | Admitting: Cardiovascular Disease

## 2019-05-19 ENCOUNTER — Other Ambulatory Visit: Payer: Self-pay

## 2019-05-19 ENCOUNTER — Encounter: Payer: Self-pay | Admitting: Cardiovascular Disease

## 2019-05-19 ENCOUNTER — Ambulatory Visit: Payer: Self-pay | Admitting: Cardiology

## 2019-05-19 VITALS — BP 110/80 | HR 84 | Ht 66.0 in | Wt 253.0 lb

## 2019-05-19 DIAGNOSIS — R079 Chest pain, unspecified: Secondary | ICD-10-CM

## 2019-05-19 NOTE — Patient Instructions (Addendum)
Referral to Dr. Yves Dill for right chest/neck Arm pain [facility] [phone] Scheduled with: Dr. Mariah Milling at 08:00 AM tomorrow 05/20/2019  Medication Instructions:  No changes  If you need a refill on your cardiac medications before your next appointment, please call your pharmacy.    Lab work: No new labs needed   If you have labs (blood work) drawn today and your tests are completely normal, you will receive your results only by: Marland Kitchen MyChart Message (if you have MyChart) OR . A paper copy in the mail If you have any lab test that is abnormal or we need to change your treatment, we will call you to review the results.   Testing/Procedures: No new testing needed   Follow-Up: At [facility], you and your health needs are our priority.  As part of our continuing mission to provide you with exceptional heart care, we have created designated Provider Care Teams.  These Care Teams include your primary Cardiologist (physician) and Advanced Practice Providers (APPs -  Physician Assistants and Nurse Practitioners) who all work together to provide you with the care you need, when you need it.  . You will need a follow up appointment as needed  . Providers on your designated Care Team:   . Nicolasa Ducking, NP . Eula Listen, PA-C . Marisue Ivan, PA-C  Any Other Special Instructions Will Be Listed Below (If Applicable).  For educational health videos Log in to : [email or web address] Or : [email or web address], password : [email or web address]

## 2019-05-19 NOTE — Progress Notes (Signed)
Cardiology Office Note  Date:  05/19/2019   ID:  Michele [name], DOB Jan 21, 1966, MRN [id number]  PCP:  Gracelyn Nurse, MD   Chief Complaint  Patient presents with  . New Patient (Initial Visit)    Patient currently having active chest pain -- 10/10 chest pain and RIGHT arm pain. Meds reviewed verbally with patient.     HPI:  Michele [name] is a 54 y.o. female with a History of  diabetes  hypertension  who presents with complaints of chest pain.    Seen in the ER 05/13/2019 Chest pain records reviewed Patient reports 3 to 4 days of central chest discomfort.  She initially attributed this to acid reflux and tried some Alka-Seltzer with little improvement.  She denies shortness of breath.  Sometimes moving seems to make the pain worse.   -Gi cocktail Felt to be esophagitis secondary to GERD, will start her on Protonix, Carafate  Two weeks of constant pain  Right arm movement exacerbates her pain Difficulty sitting up on the exam table today particularly when using her right arm Pain radiating through upper right chest into right neck, posterior cervical upper thoracic spine -Denies any back pain  Difficulty laying in bed, Feels that she may have laid on her arm wrong causing the discomfort Pain keeping her awake at nighttime, sometimes if she hangs it over the side of the bed she is able to sleep  Lab work reviewed HBA1C 8.2 Total chol 190, LDL 97  EKG personally reviewed by myself on todays visit NSR rate 84 bpm no ST or Twave changes   PMH:   has a past medical history of Diabetes mellitus without complication (HCC), Hypertension, and Migraines.  PSH:    Past Surgical History:  Procedure Laterality Date  . ABDOMINAL HYSTERECTOMY    . ANKLE SURGERY    . CESAREAN SECTION    . KNEE SURGERY    . WRIST SURGERY      Current Outpatient Medications  Medication Sig Dispense Refill  . atorvastatin (LIPITOR) 20 MG tablet Take 20 mg by mouth daily at 6 PM.    .  glipiZIDE (GLUCOTROL) 5 MG tablet Take 5 mg by mouth daily before breakfast.    . insulin glargine (LANTUS) 100 UNIT/ML injection Inject 18 Units into the skin at bedtime.     . metFORMIN (GLUCOPHAGE) 1000 MG tablet Take 1 tablet (1,000 mg total) by mouth 2 (two) times daily with a meal. (Patient taking differently: Take 500 mg by mouth daily. ) 60 tablet 11  . pantoprazole (PROTONIX) 20 MG tablet Take 1 tablet (20 mg total) by mouth daily. 30 tablet 1  . sucralfate (CARAFATE) 1 g tablet Take 1 tablet (1 g total) by mouth 4 (four) times daily for 15 days. 60 tablet 0  . SUMAtriptan-naproxen (TREXIMET) 85-500 MG per tablet Take 1 tablet by mouth every 2 (two) hours as needed for migraine.    . topiramate (TOPAMAX) 100 MG tablet Take 100 mg by mouth 2 (two) times daily.    Marland Kitchen triamterene-hydrochlorothiazide (MAXZIDE-25) 37.5-25 MG per tablet Take 1 tablet by mouth daily.    . verapamil (COVERA HS) 180 MG (CO) 24 hr tablet Take 180 mg by mouth at bedtime.    Marland Kitchen amitriptyline (ELAVIL) 50 MG tablet Take 50 mg by mouth at bedtime.     No current facility-administered medications for this visit.     Allergies:   Patient has no known allergies.   Social History:  The  patient  reports that she has never smoked. Her smokeless tobacco use includes snuff. She reports current alcohol use. She reports that she does not use drugs.   Family History:   family history is not on file.    Review of Systems: Review of Systems  Constitutional: Negative.   HENT: Negative.   Respiratory: Negative.   Cardiovascular: Negative.   Gastrointestinal: Negative.   Musculoskeletal: Negative.        Right chest pain on palpation and movement, right arm pain  Neurological: Negative.   Psychiatric/Behavioral: Negative.   All other systems reviewed and are negative.   PHYSICAL EXAM: VS:  BP 110/80 (BP Location: Left Arm, Patient Position: Sitting, Cuff Size: Normal)   Pulse 84   Ht 5\' 6"  (1.676 m)   Wt 253 lb  (114.8 kg)   SpO2 97%   BMI 40.84 kg/m  , BMI Body mass index is 40.84 kg/m. GEN: Well nourished, well developed,  Significant distress when trying to sit up especially when using her right arm HEENT: normal Neck: no JVD, carotid bruits, or masses Cardiac: RRR; no murmurs, rubs, or gallops,no edema  Respiratory:  clear to auscultation bilaterally, normal work of breathing GI: soft, nontender, nondistended, + BS MS: no deformity or atrophy Skin: warm and dry, no rash Neuro:  Strength and sensation are intact Psych: euthymic mood, full affect   Recent Labs: 02/20/2019: ALT 19 05/13/2019: BUN 18; Creatinine, Ser 0.97; Hemoglobin 13.1; Platelets 289; Potassium 3.7; Sodium 137    Lipid Panel No results found for: CHOL, HDL, LDLCALC, TRIG    Wt Readings from Last 3 Encounters:  05/19/19 253 lb (114.8 kg)  03/18/19 247 lb (112 kg)  03/15/19 251 lb 5.2 oz (114 kg)       ASSESSMENT AND PLAN:  Problem List Items Addressed This Visit    None    Visit Diagnoses    Chest pain of uncertain etiology    -  Primary   Relevant Orders   EKG 12-Lead     Atypical chest pain, clearly musculoskeletal in nature Has severe pain when trying to sit up from a supine position especially when using her right arm On exam had clear and significant pain when pushing right upper chest underneath right clavicular area Some pain radiating up the right side of neck, sometimes down the right arm Concern for impingement, rib issue -Recommend she see physiotherapist Recommended ice/heat, NSAIDs, range of motion stretching Avoid heavy lifting -We have called the office of Dr. Sharlet Salina and we have helped arrange an appointment for tomorrow morning She recommended we make this call given the severity of her pain and inability to sleep secondary to discomfort  --No further cardiac work-up needed at this time  Disposition:   F/U as needed   Total encounter time more than 60 minutes  Greater than 50%  was spent in counseling and coordination of care with the patient    Signed, Esmond Plants, M.D., Ph.D. [place], [place]

## 2019-05-20 ENCOUNTER — Other Ambulatory Visit: Payer: Self-pay | Admitting: Physical Medicine and Rehabilitation

## 2019-05-20 DIAGNOSIS — M5412 Radiculopathy, cervical region: Secondary | ICD-10-CM

## 2019-06-01 ENCOUNTER — Other Ambulatory Visit: Payer: Self-pay

## 2019-06-01 ENCOUNTER — Ambulatory Visit
Admission: RE | Admit: 2019-06-01 | Discharge: 2019-06-01 | Disposition: A | Discharge status: Home / Self Care | Payer: Self-pay | Source: Ambulatory Visit | Attending: Physical Medicine and Rehabilitation | Admitting: Physical Medicine and Rehabilitation

## 2019-06-01 DIAGNOSIS — M5412 Radiculopathy, cervical region: Secondary | ICD-10-CM | POA: Insufficient documentation

## 2019-06-04 ENCOUNTER — Emergency Department
Admission: EM | Admit: 2019-06-04 | Discharge: 2019-06-04 | Disposition: A | Discharge status: Home / Self Care | Payer: Self-pay | Attending: Emergency Medicine | Admitting: Emergency Medicine

## 2019-06-04 ENCOUNTER — Other Ambulatory Visit: Payer: Self-pay

## 2019-06-04 DIAGNOSIS — I10 Essential (primary) hypertension: Secondary | ICD-10-CM | POA: Insufficient documentation

## 2019-06-04 DIAGNOSIS — M791 Myalgia, unspecified site: Secondary | ICD-10-CM | POA: Insufficient documentation

## 2019-06-04 DIAGNOSIS — Z79899 Other long term (current) drug therapy: Secondary | ICD-10-CM | POA: Insufficient documentation

## 2019-06-04 DIAGNOSIS — F17228 Nicotine dependence, chewing tobacco, with other nicotine-induced disorders: Secondary | ICD-10-CM | POA: Insufficient documentation

## 2019-06-04 DIAGNOSIS — R202 Paresthesia of skin: Secondary | ICD-10-CM | POA: Insufficient documentation

## 2019-06-04 DIAGNOSIS — Z794 Long term (current) use of insulin: Secondary | ICD-10-CM | POA: Insufficient documentation

## 2019-06-04 DIAGNOSIS — E119 Type 2 diabetes mellitus without complications: Secondary | ICD-10-CM | POA: Insufficient documentation

## 2019-06-04 MED ORDER — KETOROLAC TROMETHAMINE 30 MG/ML IJ SOLN
30.0000 mg | Freq: Once | INTRAMUSCULAR | Status: AC
  Administered 2019-06-04: 30 mg via INTRAMUSCULAR
  Filled 2019-06-04: qty 1

## 2019-06-04 MED ORDER — TRAMADOL HCL 50 MG PO TABS: 50.0000 mg | ORAL_TABLET | Freq: Four times a day (QID) | ORAL | 0 refills | Status: DC | PRN

## 2019-06-04 NOTE — ED Provider Notes (Signed)
[facility] Emergency Department Provider Note ____________________________________________   First MD Initiated Contact with Patient 06/04/19 1624     (approximate)  I have reviewed the triage vital signs and the nursing notes.   HISTORY  Chief Complaint Generalized Body Aches  HPI Ezell Melikian is a 54 y.o. female  With history of  Diabetes presents to the emergency department for treatment and evaluation of body aches, body burning on the right side for the past 2 days. She states that the only part of her body that doesn't hurt are her feet and the front of her legs. She denies similar symptoms in the past.       Past Medical History:  Diagnosis Date  . Diabetes mellitus without complication (HCC)   . Hypertension   . Migraines     Patient Active Problem List   Diagnosis Date Noted  . Lumbar facet joint syndrome 03/21/2018  . Chronic bilateral low back pain without sciatica 03/21/2018  . Chronic pain syndrome 03/21/2018  . Obesity, morbid, BMI 40.0-49.9 (HCC) 07/12/2017  . Bilateral occipital neuralgia 09/14/2016  . Neck pain 12/16/2014  . Migraines 04/28/2014  . Diabetes mellitus type 2, uncomplicated (HCC) 04/28/2014    Past Surgical History:  Procedure Laterality Date  . ABDOMINAL HYSTERECTOMY    . ANKLE SURGERY    . CESAREAN SECTION    . KNEE SURGERY    . WRIST SURGERY      Prior to Admission medications   Medication Sig Start Date End Date Taking? Authorizing Provider  amitriptyline (ELAVIL) 50 MG tablet Take 50 mg by mouth at bedtime. 12/09/18   [provider]  atorvastatin (LIPITOR) 20 MG tablet Take 20 mg by mouth daily at 6 PM.    [provider]  glipiZIDE (GLUCOTROL) 5 MG tablet Take 5 mg by mouth daily before breakfast.    [provider]  insulin glargine (LANTUS) 100 UNIT/ML injection Inject 18 Units into the skin at bedtime.     [provider]  metFORMIN (GLUCOPHAGE) 1000 MG tablet  Take 1 tablet (1,000 mg total) by mouth 2 (two) times daily with a meal. Patient taking differently: Take 500 mg by mouth daily.  03/29/17 05/19/19  Emily Filbert, MD  pantoprazole (PROTONIX) 20 MG tablet Take 1 tablet (20 mg total) by mouth daily. 05/13/19 05/12/20  Jene Every, MD  sucralfate (CARAFATE) 1 g tablet Take 1 tablet (1 g total) by mouth 4 (four) times daily for 15 days. 05/13/19 05/28/19  Jene Every, MD  SUMAtriptan-naproxen (TREXIMET) 85-500 MG per tablet Take 1 tablet by mouth every 2 (two) hours as needed for migraine.    [provider]  topiramate (TOPAMAX) 100 MG tablet Take 100 mg by mouth 2 (two) times daily.    [provider]  traMADol (ULTRAM) 50 MG tablet Take 1 tablet (50 mg total) by mouth every 6 (six) hours as needed. 06/04/19   Zara Wendt B, FNP  triamterene-hydrochlorothiazide (MAXZIDE-25) 37.5-25 MG per tablet Take 1 tablet by mouth daily.    [provider]  verapamil (COVERA HS) 180 MG (CO) 24 hr tablet Take 180 mg by mouth at bedtime.    [provider]    Allergies Patient has no known allergies.  Family History  Problem Relation Age of Onset  . Breast cancer Neg Hx     Social History Social History   Tobacco Use  . Smoking status: Never Smoker  . Smokeless tobacco: Current User  Types: Snuff  Substance Use Topics  . Alcohol use: Yes    Comment: occasionally  . Drug use: No    Review of Systems  Constitutional: No fever/chills Eyes: No visual changes. ENT: No sore throat. Cardiovascular: Denies chest pain. Respiratory: Denies shortness of breath. Gastrointestinal: No abdominal pain.  No nausea, no vomiting.  No diarrhea.  No constipation. Genitourinary: Negative for dysuria. Musculoskeletal: Positive for bodyaches. Skin: Negative for rash. Neurological: Negative for headaches, focal weakness or numbness. Positive for diffuse right side  paresthesias. ____________________________________________   PHYSICAL EXAM:  VITAL SIGNS: ED Triage Vitals  Enc Vitals Group     BP 06/04/19 1428 118/83     Pulse Rate 06/04/19 1428 (!) 113     Resp 06/04/19 1428 16     Temp 06/04/19 1428 97.6 F (36.4 C)     Temp Source 06/04/19 1428 Oral     SpO2 06/04/19 1428 98 %     Weight 06/04/19 1429 248 lb (112.5 kg)     Height 06/04/19 1429 5\' 6"  (1.676 m)     Head Circumference --      Peak Flow --      Pain Score 06/04/19 1428 10     Pain Loc --      Pain Edu? --      Excl. in [place]? --     Constitutional: Alert and oriented. Well appearing and in no acute distress. Eyes: Conjunctivae are normal. PERRL. Head: Atraumatic. Nose: No congestion/rhinnorhea. Mouth/Throat: Mucous membranes are moist.  Oropharynx non-erythematous. Neck: No stridor.   Hematological/Lymphatic/Immunilogical: No cervical lymphadenopathy. Cardiovascular: Normal rate, regular rhythm. Grossly normal heart sounds.  Good peripheral circulation. Respiratory: Normal respiratory effort.  No retractions. Lungs CTAB. Gastrointestinal: Soft and nontender. No distention. No abdominal bruits. No CVA tenderness. Genitourinary:  Musculoskeletal: No lower extremity tenderness nor edema. No joint effusions. Neurologic:  Normal speech and language. No gross focal neurologic deficits are appreciated. No gait instability. Skin:  Skin is warm, dry and intact. No rash noted. Psychiatric: Mood and affect are normal. Speech and behavior are normal.  ____________________________________________   LABS (all labs ordered are listed, but only abnormal results are displayed)  Labs Reviewed - No data to display ____________________________________________  EKG  Not indicated ____________________________________________  RADIOLOGY  ED MD interpretation:    Not indicated I, Sherrie George, personally viewed and evaluated these images (plain radiographs) as part of my medical  decision making, as well as reviewing the written report by the radiologist.  Official radiology report(s): No results found.  ____________________________________________   PROCEDURES  Procedure(s) performed (including Critical Care):  Procedures  ____________________________________________   INITIAL IMPRESSION / ASSESSMENT AND PLAN     54 year old female presenting to the emergency department for multiple medical complaints.  See HPI for further details.  Plan will be to provide pain control and review her medication list to see if any medications may be causing her paresthesias and myalgia.  DIFFERENTIAL DIAGNOSIS  Paresthesias, medication adverse effect, myalgias.  ED COURSE  Does appear that the patient is taking Topamax.  She states that she has been on it for a very long time and is prescribed to control her migraines.  Symptoms today are vague and may potentially be related to adverse effect of Topamax.  She was advised to talk to her neurologist about this.  No indication for further work-up today. ____________________________________________   FINAL CLINICAL IMPRESSION(S) / ED DIAGNOSES  Final diagnoses:  Paresthesia  Myalgia     ED  Discharge Orders         Ordered    traMADol (ULTRAM) 50 MG tablet  Every 6 hours PRN     06/04/19 1703           Kristiana Jacko was evaluated in Emergency Department on 06/04/2019 for the symptoms described in the history of present illness. She was evaluated in the context of the global COVID-19 pandemic, which necessitated consideration that the patient might be at risk for infection with the SARS-CoV-2 virus that causes COVID-19. Institutional protocols and algorithms that pertain to the evaluation of patients at risk for COVID-19 are in a state of rapid change based on information released by regulatory bodies including the CDC and federal and state organizations. These policies and algorithms were followed during the  patient's care in the ED.   Note:  This document was prepared using Dragon voice recognition software and may include unintentional dictation errors.   Chinita Pester, FNP 06/04/19 1932    Sharyn Creamer, MD 06/04/19 2122

## 2019-06-04 NOTE — ED Triage Notes (Addendum)
Pt c/o body aches/body burning  x2 days - pain is only present on the right side  - Denies chest pain/SHOB/cough/runny nose/sore throat

## 2019-06-04 NOTE — Discharge Instructions (Addendum)
Your symptoms may be related to Topamax. Please call and schedule an appointment.

## 2019-06-25 ENCOUNTER — Other Ambulatory Visit: Payer: Self-pay

## 2019-06-25 ENCOUNTER — Encounter: Payer: Self-pay | Admitting: Emergency Medicine

## 2019-06-25 ENCOUNTER — Emergency Department
Admission: EM | Admit: 2019-06-25 | Discharge: 2019-06-25 | Disposition: A | Discharge status: Home / Self Care | Payer: Self-pay | Attending: Student in an Organized Health Care Education/Training Program | Admitting: Student in an Organized Health Care Education/Training Program

## 2019-06-25 DIAGNOSIS — Z794 Long term (current) use of insulin: Secondary | ICD-10-CM | POA: Insufficient documentation

## 2019-06-25 DIAGNOSIS — M5441 Lumbago with sciatica, right side: Secondary | ICD-10-CM | POA: Insufficient documentation

## 2019-06-25 DIAGNOSIS — E119 Type 2 diabetes mellitus without complications: Secondary | ICD-10-CM | POA: Insufficient documentation

## 2019-06-25 DIAGNOSIS — F17290 Nicotine dependence, other tobacco product, uncomplicated: Secondary | ICD-10-CM | POA: Insufficient documentation

## 2019-06-25 LAB — URINALYSIS, COMPLETE (UACMP) WITH MICROSCOPIC
Bacteria, UA: NONE SEEN
Bilirubin Urine: NEGATIVE
Glucose, UA: 50 mg/dL — AB
Hgb urine dipstick: NEGATIVE
Ketones, ur: NEGATIVE mg/dL
Leukocytes,Ua: NEGATIVE
Nitrite: NEGATIVE
Protein, ur: NEGATIVE mg/dL
Specific Gravity, Urine: 1.02 (ref 1.005–1.030)
pH: 6 (ref 5.0–8.0)

## 2019-06-25 MED ORDER — ONDANSETRON 4 MG PO TBDP
4.0000 mg | ORAL_TABLET | Freq: Once | ORAL | Status: AC
  Administered 2019-06-25: 4 mg via ORAL
  Filled 2019-06-25: qty 1

## 2019-06-25 MED ORDER — METHOCARBAMOL 500 MG PO TABS: 500.0000 mg | ORAL_TABLET | Freq: Three times a day (TID) | ORAL | 0 refills | Status: AC | PRN

## 2019-06-25 MED ORDER — PREDNISONE 10 MG (21) PO TBPK: ORAL_TABLET | ORAL | 0 refills | Status: DC

## 2019-06-25 MED ORDER — DEXAMETHASONE SODIUM PHOSPHATE 10 MG/ML IJ SOLN: 10.0000 mg | Freq: Once | INTRAMUSCULAR | Status: DC

## 2019-06-25 MED ORDER — KETOROLAC TROMETHAMINE 30 MG/ML IJ SOLN
30.0000 mg | Freq: Once | INTRAMUSCULAR | Status: AC
  Administered 2019-06-25: 30 mg via INTRAMUSCULAR
  Filled 2019-06-25: qty 1

## 2019-06-25 MED ORDER — HYDROCODONE-ACETAMINOPHEN 5-325 MG PO TABS
1.0000 | ORAL_TABLET | Freq: Once | ORAL | Status: AC
  Administered 2019-06-25: 1 via ORAL
  Filled 2019-06-25: qty 1

## 2019-06-25 MED ORDER — DEXAMETHASONE SODIUM PHOSPHATE 10 MG/ML IJ SOLN
10.0000 mg | Freq: Once | INTRAMUSCULAR | Status: AC
  Administered 2019-06-25: 10 mg via INTRAMUSCULAR
  Filled 2019-06-25: qty 1

## 2019-06-25 NOTE — ED Provider Notes (Signed)
Emergency Department Provider Note  ____________________________________________  Time seen: Approximately 8:37 PM  I have reviewed the triage vital signs and the nursing notes.   HISTORY  Chief Complaint Back Pain   Historian Patient     HPI Michele [name] is a 54 y.o. female presents to the emergency department with right-sided low back pain that radiates down the inner aspect of the right leg.  Patient states that pain radiates into the right foot and feels like a numbness and tingling sensation.  She states that she has experienced pain like this in the past.  No bowel or bladder incontinence or saddle anesthesia.  She has been able to ambulate.  No falls or mechanisms of trauma.  She denies dysuria, hematuria or increased urinary frequency.   Past Medical History:  Diagnosis Date  . Diabetes mellitus without complication (HCC)   . Hypertension   . Migraines      Immunizations up to date:  Yes.     Past Medical History:  Diagnosis Date  . Diabetes mellitus without complication (HCC)   . Hypertension   . Migraines     Patient Active Problem List   Diagnosis Date Noted  . Lumbar facet joint syndrome 03/21/2018  . Chronic bilateral low back pain without sciatica 03/21/2018  . Chronic pain syndrome 03/21/2018  . Obesity, morbid, BMI 40.0-49.9 (HCC) 07/12/2017  . Bilateral occipital neuralgia 09/14/2016  . Neck pain 12/16/2014  . Migraines 04/28/2014  . Diabetes mellitus type 2, uncomplicated (HCC) 04/28/2014    Past Surgical History:  Procedure Laterality Date  . ABDOMINAL HYSTERECTOMY    . ANKLE SURGERY    . CESAREAN SECTION    . KNEE SURGERY    . WRIST SURGERY      Prior to Admission medications   Medication Sig Start Date End Date Taking? Authorizing Provider  amitriptyline (ELAVIL) 50 MG tablet Take 50 mg by mouth at bedtime. 12/09/18   [provider]  atorvastatin (LIPITOR) 20 MG tablet Take 20 mg by mouth daily at 6 PM.    [provider]  glipiZIDE (GLUCOTROL) 5 MG tablet Take 5 mg by mouth daily before breakfast.    [provider]  insulin glargine (LANTUS) 100 UNIT/ML injection Inject 18 Units into the skin at bedtime.     [provider]  metFORMIN (GLUCOPHAGE) 1000 MG tablet Take 1 tablet (1,000 mg total) by mouth 2 (two) times daily with a meal. Patient taking differently: Take 500 mg by mouth daily.  03/29/17 05/19/19  Emily Filbert, MD  methocarbamol (ROBAXIN) 500 MG tablet Take 1 tablet (500 mg total) by mouth every 8 (eight) hours as needed for up to 5 days. 06/25/19 06/30/19  Orvil Feil, PA-C  pantoprazole (PROTONIX) 20 MG tablet Take 1 tablet (20 mg total) by mouth daily. 05/13/19 05/12/20  Jene Every, MD  predniSONE (STERAPRED UNI-PAK 21 TAB) 10 MG (21) TBPK tablet Take 6 tablets the first day, take 5 tablets the second day, take 4 tablets the third day, take 3 tablets the fourth day, take 2 tablets the fifth day, take 1 tablet the sixth day. 06/25/19   Orvil Feil, PA-C  sucralfate (CARAFATE) 1 g tablet Take 1 tablet (1 g total) by mouth 4 (four) times daily for 15 days. 05/13/19 05/28/19  Jene Every, MD  SUMAtriptan-naproxen (TREXIMET) 85-500 MG per tablet Take 1 tablet by mouth every 2 (two) hours as needed for migraine.    [provider]  topiramate (  TOPAMAX) 100 MG tablet Take 100 mg by mouth 2 (two) times daily.    [provider]  traMADol (ULTRAM) 50 MG tablet Take 1 tablet (50 mg total) by mouth every 6 (six) hours as needed. 06/04/19   Triplett, Cari B, FNP  triamterene-hydrochlorothiazide (MAXZIDE-25) 37.5-25 MG per tablet Take 1 tablet by mouth daily.    [provider]  verapamil (COVERA HS) 180 MG (CO) 24 hr tablet Take 180 mg by mouth at bedtime.    [provider]    Allergies Patient has no known allergies.  Family History  Problem Relation Age of Onset  . Breast cancer Neg Hx     Social History Social History    Tobacco Use  . Smoking status: Never Smoker  . Smokeless tobacco: Current User    Types: Snuff  Substance Use Topics  . Alcohol use: Yes    Comment: occasionally  . Drug use: No     Review of Systems  Constitutional: No fever/chills Eyes:  No discharge ENT: No upper respiratory complaints. Respiratory: no cough. No SOB/ use of accessory muscles to breath Gastrointestinal:   No nausea, no vomiting.  No diarrhea.  No constipation. Musculoskeletal: Patient has low back pain.  Skin: Negative for rash, abrasions, lacerations, ecchymosis.   ____________________________________________   PHYSICAL EXAM:  VITAL SIGNS: ED Triage Vitals  Enc Vitals Group     BP 06/25/19 1909 (!) 150/76     Pulse --      Resp 06/25/19 1909 20     Temp 06/25/19 1909 98.6 F (37 C)     Temp Source 06/25/19 1909 Oral     SpO2 06/25/19 1909 99 %     Weight 06/25/19 1907 248 lb (112.5 kg)     Height 06/25/19 1907 5\' 6"  (1.676 m)     Head Circumference --      Peak Flow --      Pain Score 06/25/19 1907 10     Pain Loc --      Pain Edu? --      Excl. in [place]? --      Constitutional: Alert and oriented. Well appearing and in no acute distress. Eyes: Conjunctivae are normal. PERRL. EOMI. Head: Atraumatic. Cardiovascular: Normal rate, regular rhythm. Normal S1 and S2.  Good peripheral circulation. Respiratory: Normal respiratory effort without tachypnea or retractions. Lungs CTAB. Good air entry to the bases with no decreased or absent breath sounds Gastrointestinal: Bowel sounds x 4 quadrants. Soft and nontender to palpation. No guarding or rigidity. No distention. Musculoskeletal: Full range of motion to all extremities. No obvious deformities noted.  Patient has a positive straight leg raise test on the right.  Patient has some paraspinal muscle tenderness along the right. Neurologic:  Normal for age. No gross focal neurologic deficits are appreciated.  Skin:  Skin is warm, dry and intact. No  rash noted. Psychiatric: Mood and affect are normal for age. Speech and behavior are normal.   ____________________________________________   LABS (all labs ordered are listed, but only abnormal results are displayed)  Labs Reviewed  URINALYSIS, COMPLETE (UACMP) WITH MICROSCOPIC - Abnormal; Notable for the following components:      Result Value   Color, Urine YELLOW (*)    APPearance CLEAR (*)    Glucose, UA 50 (*)    All other components within normal limits   ____________________________________________  EKG   ____________________________________________  RADIOLOGY   No results found.  ____________________________________________    PROCEDURES  Procedure(s)  performed:     Procedures     Medications  HYDROcodone-acetaminophen (NORCO/VICODIN) 5-325 MG per tablet 1 tablet (1 tablet Oral Given 06/25/19 2056)  ondansetron (ZOFRAN-ODT) disintegrating tablet 4 mg (4 mg Oral Given 06/25/19 2056)  dexamethasone (DECADRON) injection 10 mg (10 mg Intramuscular Given 06/25/19 2052)  ketorolac (TORADOL) 30 MG/ML injection 30 mg (30 mg Intramuscular Given 06/25/19 2251)     ____________________________________________   INITIAL IMPRESSION / ASSESSMENT AND PLAN / ED COURSE  Pertinent labs & imaging results that were available during my care of the patient were reviewed by me and considered in my medical decision making (see chart for details).    Assessment and plan:  Low back pain 54 year old female presents to the emergency department with low back pain that radiates along the right lower extremity.  Patient was mildly hypertensive at triage but vital signs were otherwise reassuring.  Patient has a positive straight leg raise test on the right.  Will administer Decadron and Norco and will reassess.  Patient stated that her pain had improved some with aforementioned medications.  Patient was discharged with prednisone and Robaxin.  Return precautions were given to  return to the emergency department with new or worsening symptoms.  All patient questions were answered.  ____________________________________________  FINAL CLINICAL IMPRESSION(S) / ED DIAGNOSES  Final diagnoses:  Acute right-sided low back pain with right-sided sciatica      NEW MEDICATIONS STARTED DURING THIS VISIT:  ED Discharge Orders         Ordered    predniSONE (STERAPRED UNI-PAK 21 TAB) 10 MG (21) TBPK tablet     06/25/19 2119    methocarbamol (ROBAXIN) 500 MG tablet  Every 8 hours PRN     06/25/19 2119              This chart was dictated using voice recognition software/Dragon. Despite best efforts to proofread, errors can occur which can change the meaning. Any change was purely unintentional.     Karren Cobble 06/25/19 2259    Merlyn Lot, MD 06/26/19 [phone]

## 2019-06-25 NOTE — ED Triage Notes (Signed)
Patient ambulatory to triage with steady gait, without difficulty or distress noted, mask in place; Pt reports x 2 days having lower back pain radiating down legs; denies any accomp symptoms, denies any injury

## 2019-06-25 NOTE — ED Notes (Signed)
See triage note. Pain onset 2 days ago beginning in low back and radiating down bilateral hamstrings and into both calf muscles. Pt states she is able to walk but pain is severe. Pt has Hx of similar LBP but never this severe.

## 2019-09-24 ENCOUNTER — Other Ambulatory Visit: Payer: Self-pay

## 2019-09-24 ENCOUNTER — Emergency Department
Admission: EM | Admit: 2019-09-24 | Discharge: 2019-09-24 | Disposition: A | Discharge status: Home / Self Care | Payer: Self-pay | Attending: Emergency Medicine | Admitting: Emergency Medicine

## 2019-09-24 ENCOUNTER — Encounter: Payer: Self-pay | Admitting: Emergency Medicine

## 2019-09-24 DIAGNOSIS — E119 Type 2 diabetes mellitus without complications: Secondary | ICD-10-CM | POA: Insufficient documentation

## 2019-09-24 DIAGNOSIS — K0889 Other specified disorders of teeth and supporting structures: Secondary | ICD-10-CM | POA: Insufficient documentation

## 2019-09-24 DIAGNOSIS — Z7984 Long term (current) use of oral hypoglycemic drugs: Secondary | ICD-10-CM | POA: Insufficient documentation

## 2019-09-24 DIAGNOSIS — Z98818 Other dental procedure status: Secondary | ICD-10-CM | POA: Insufficient documentation

## 2019-09-24 DIAGNOSIS — G43909 Migraine, unspecified, not intractable, without status migrainosus: Secondary | ICD-10-CM | POA: Insufficient documentation

## 2019-09-24 DIAGNOSIS — I10 Essential (primary) hypertension: Secondary | ICD-10-CM | POA: Insufficient documentation

## 2019-09-24 MED ORDER — LIDOCAINE VISCOUS HCL 2 % MT SOLN: 5.0000 mL | Freq: Four times a day (QID) | OROMUCOSAL | 0 refills | Status: DC | PRN

## 2019-09-24 MED ORDER — OXYCODONE-ACETAMINOPHEN 7.5-325 MG PO TABS: 1.0000 | ORAL_TABLET | Freq: Four times a day (QID) | ORAL | 0 refills | Status: DC | PRN

## 2019-09-24 MED ORDER — LIDOCAINE VISCOUS HCL 2 % MT SOLN
15.0000 mL | Freq: Once | OROMUCOSAL | Status: AC
  Administered 2019-09-24: 15 mL via OROMUCOSAL
  Filled 2019-09-24: qty 15

## 2019-09-24 NOTE — ED Triage Notes (Signed)
Patient ambulatory to triage with steady gait, without difficulty or distress noted; c/o pain after left lower dental extraction yesterday

## 2019-09-24 NOTE — Discharge Instructions (Signed)
Follow discharge care instruction take medication as directed.  Follow-up with treating dentist.

## 2019-09-24 NOTE — ED Provider Notes (Signed)
[facility] Emergency Department Provider Note   ____________________________________________   First MD Initiated Contact with Patient 09/24/19 [phone]     (approximate)  I have reviewed the triage vital signs and the nursing notes.   HISTORY  Chief Complaint Dental Pain    HPI Michele [name] is a 54 y.o. female patient complain of dental pain status post tooth extraction yesterday by dentist.  Patient state no relief with ibuprofen.  Patient rates pain as a 10/10.  Patient described pain is "achy".         Past Medical History:  Diagnosis Date  . Diabetes mellitus without complication (HCC)   . Hypertension   . Migraines     Patient Active Problem List   Diagnosis Date Noted  . Lumbar facet joint syndrome 03/21/2018  . Chronic bilateral low back pain without sciatica 03/21/2018  . Chronic pain syndrome 03/21/2018  . Obesity, morbid, BMI 40.0-49.9 (HCC) 07/12/2017  . Bilateral occipital neuralgia 09/14/2016  . Neck pain 12/16/2014  . Migraines 04/28/2014  . Diabetes mellitus type 2, uncomplicated (HCC) 04/28/2014    Past Surgical History:  Procedure Laterality Date  . ABDOMINAL HYSTERECTOMY    . ANKLE SURGERY    . CESAREAN SECTION    . KNEE SURGERY    . WRIST SURGERY      Prior to Admission medications   Medication Sig Start Date End Date Taking? Authorizing Provider  amitriptyline (ELAVIL) 50 MG tablet Take 50 mg by mouth at bedtime. 12/09/18   [provider]  atorvastatin (LIPITOR) 20 MG tablet Take 20 mg by mouth daily at 6 PM.    [provider]  glipiZIDE (GLUCOTROL) 5 MG tablet Take 5 mg by mouth daily before breakfast.    [provider]  insulin glargine (LANTUS) 100 UNIT/ML injection Inject 18 Units into the skin at bedtime.     [provider]  lidocaine (XYLOCAINE) 2 % solution Use as directed 5 mLs in the mouth or throat every 6 (six) hours as needed for mouth pain. 09/24/19   Joni Reining, PA-C  metFORMIN (GLUCOPHAGE) 1000 MG tablet Take 1 tablet (1,000 mg total) by mouth 2 (two) times daily with a meal. Patient taking differently: Take 500 mg by mouth daily.  03/29/17 05/19/19  Emily Filbert, MD  oxyCODONE-acetaminophen (PERCOCET) 7.5-325 MG tablet Take 1 tablet by mouth every 6 (six) hours as needed. 09/24/19   Joni Reining, PA-C  pantoprazole (PROTONIX) 20 MG tablet Take 1 tablet (20 mg total) by mouth daily. 05/13/19 05/12/20  Jene Every, MD  predniSONE (STERAPRED UNI-PAK 21 TAB) 10 MG (21) TBPK tablet Take 6 tablets the first day, take 5 tablets the second day, take 4 tablets the third day, take 3 tablets the fourth day, take 2 tablets the fifth day, take 1 tablet the sixth day. 06/25/19   Orvil Feil, PA-C  sucralfate (CARAFATE) 1 g tablet Take 1 tablet (1 g total) by mouth 4 (four) times daily for 15 days. 05/13/19 05/28/19  Jene Every, MD  SUMAtriptan-naproxen (TREXIMET) 85-500 MG per tablet Take 1 tablet by mouth every 2 (two) hours as needed for migraine.    [provider]  topiramate (TOPAMAX) 100 MG tablet Take 100 mg by mouth 2 (two) times daily.    [provider]  traMADol (ULTRAM) 50 MG tablet Take 1 tablet (50 mg total) by mouth every 6 (six) hours as needed. 06/04/19   Chinita Pester, FNP  triamterene-hydrochlorothiazide (MAXZIDE-25) 37.5-25 MG per tablet Take 1 tablet by mouth daily.    [provider]  verapamil (COVERA HS) 180 MG (CO) 24 hr tablet Take 180 mg by mouth at bedtime.    [provider]    Allergies Patient has no known allergies.  Family History  Problem Relation Age of Onset  . Breast cancer Neg Hx     Social History Social History   Tobacco Use  . Smoking status: Never Smoker  . Smokeless tobacco: Current User    Types: Snuff  Vaping Use  . Vaping Use: Never used  Substance Use Topics  . Alcohol use: Yes    Comment: occasionally  . Drug use: No    Review of  Systems Constitutional: No fever/chills Eyes: No visual changes. ENT: No sore throat.  Dental pain. Cardiovascular: Denies chest pain. Respiratory: Denies shortness of breath. Gastrointestinal: No abdominal pain.  No nausea, no vomiting.  No diarrhea.  No constipation. Genitourinary: Negative for dysuria. Musculoskeletal: Negative for back pain. Skin: Negative for rash. Neurological: Negative for headaches, focal weakness or numbness. Endocrine:  Diabetes and hypertension   ____________________________________________   PHYSICAL EXAM:  VITAL SIGNS: ED Triage Vitals  Enc Vitals Group     BP 09/24/19 0643 (!) 147/84     Pulse Rate 09/24/19 0643 98     Resp 09/24/19 0643 18     Temp 09/24/19 0643 98.6 F (37 C)     Temp Source 09/24/19 0643 Oral     SpO2 09/24/19 0643 97 %     Weight 09/24/19 0638 220 lb (99.8 kg)     Height 09/24/19 0638 5\' 6"  (1.676 m)     Head Circumference --      Peak Flow --      Pain Score 09/24/19 0638 10     Pain Loc --      Pain Edu? --      Excl. in GC? --     Constitutional: Alert and oriented. Well appearing and in no acute distress. Mouth/Throat: Mucous membranes are moist.  Oropharynx non-erythematous.  Tooth extractions #21 Neck: No stridor. Hematological/Lymphatic/Immunilogical: No cervical lymphadenopathy. Cardiovascular: Normal rate, regular rhythm. Grossly normal heart sounds.  Good peripheral circulation. Respiratory: Normal respiratory effort.  No retractions. Lungs CTAB. Skin:  Skin is warm, dry and intact. No rash noted. ___________________________________________   LABS (all labs ordered are listed, but only abnormal results are displayed)  Labs Reviewed - No data to display ____________________________________________  EKG   ____________________________________________  RADIOLOGY  ED MD interpretation:    Official radiology report(s): No results  found.  ____________________________________________   PROCEDURES  Procedure(s) performed (including Critical Care):  Procedures   ____________________________________________   INITIAL IMPRESSION / ASSESSMENT AND PLAN / ED COURSE  As part of my medical decision making, I reviewed the following data within the electronic MEDICAL RECORD NUMBER     Patient presents with dental pain status post tooth extraction yesterday.  Patient given discharge care instruction prescription for Percocet and viscous lidocaine.  Patient advised follow-up with treating dentist.    Evalynne Locurto was evaluated in Emergency Department on 09/24/2019 for the symptoms described in the history of present illness. She was evaluated in the context of the global COVID-19 pandemic, which necessitated consideration that the patient might be at risk for infection with the SARS-CoV-2 virus that causes COVID-19. Institutional protocols and algorithms that pertain to the evaluation of patients at risk for COVID-19 are in a state of  rapid change based on information released by regulatory bodies including the CDC and federal and state organizations. These policies and algorithms were followed during the patient's care in the ED.       ____________________________________________   FINAL CLINICAL IMPRESSION(S) / ED DIAGNOSES  Final diagnoses:  Pain, dental     ED Discharge Orders         Ordered    oxyCODONE-acetaminophen (PERCOCET) 7.5-325 MG tablet  Every 6 hours PRN     Discontinue  Reprint     09/24/19 0714    lidocaine (XYLOCAINE) 2 % solution  Every 6 hours PRN     Discontinue  Reprint     09/24/19 0717           Note:  This document was prepared using Dragon voice recognition software and may include unintentional dictation errors.    Joni Reining, PA-C 09/24/19 2952    Sharman Cheek, MD 09/24/19 [phone]

## 2019-12-31 ENCOUNTER — Other Ambulatory Visit: Payer: Self-pay

## 2019-12-31 ENCOUNTER — Emergency Department
Admission: EM | Admit: 2019-12-31 | Discharge: 2019-12-31 | Disposition: A | Discharge status: Home / Self Care | Payer: Self-pay | Attending: Emergency Medicine | Admitting: Emergency Medicine

## 2019-12-31 DIAGNOSIS — E119 Type 2 diabetes mellitus without complications: Secondary | ICD-10-CM | POA: Insufficient documentation

## 2019-12-31 DIAGNOSIS — Z794 Long term (current) use of insulin: Secondary | ICD-10-CM | POA: Insufficient documentation

## 2019-12-31 DIAGNOSIS — Z79899 Other long term (current) drug therapy: Secondary | ICD-10-CM | POA: Insufficient documentation

## 2019-12-31 DIAGNOSIS — I10 Essential (primary) hypertension: Secondary | ICD-10-CM | POA: Insufficient documentation

## 2019-12-31 DIAGNOSIS — M5432 Sciatica, left side: Secondary | ICD-10-CM | POA: Insufficient documentation

## 2019-12-31 DIAGNOSIS — Z7984 Long term (current) use of oral hypoglycemic drugs: Secondary | ICD-10-CM | POA: Insufficient documentation

## 2019-12-31 DIAGNOSIS — M5431 Sciatica, right side: Secondary | ICD-10-CM | POA: Insufficient documentation

## 2019-12-31 MED ORDER — MELOXICAM 15 MG PO TABS: 15.0000 mg | ORAL_TABLET | Freq: Every day | ORAL | 0 refills | Status: DC

## 2019-12-31 MED ORDER — ORPHENADRINE CITRATE 30 MG/ML IJ SOLN
60.0000 mg | Freq: Once | INTRAMUSCULAR | Status: AC
  Administered 2019-12-31: 60 mg via INTRAMUSCULAR
  Filled 2019-12-31: qty 2

## 2019-12-31 MED ORDER — KETOROLAC TROMETHAMINE 30 MG/ML IJ SOLN
30.0000 mg | Freq: Once | INTRAMUSCULAR | Status: AC
  Administered 2019-12-31: 30 mg via INTRAMUSCULAR
  Filled 2019-12-31: qty 1

## 2019-12-31 MED ORDER — HYDROCODONE-ACETAMINOPHEN 5-325 MG PO TABS
1.0000 | ORAL_TABLET | Freq: Once | ORAL | Status: AC
  Administered 2019-12-31: 1 via ORAL
  Filled 2019-12-31: qty 1

## 2019-12-31 MED ORDER — METHOCARBAMOL 500 MG PO TABS: 500.0000 mg | ORAL_TABLET | Freq: Four times a day (QID) | ORAL | 0 refills | Status: DC
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
## 2019-12-31 NOTE — ED Triage Notes (Signed)
Pt states she has had low back pain x2 days- pt states she has a hx of sciatica and it feels like a flare up

## 2020-03-05 ENCOUNTER — Emergency Department
Admission: EM | Admit: 2020-03-05 | Discharge: 2020-03-05 | Disposition: A | Discharge status: Home / Self Care | Payer: HRSA Program | Attending: Emergency Medicine | Admitting: Emergency Medicine

## 2020-03-05 ENCOUNTER — Other Ambulatory Visit: Payer: Self-pay

## 2020-03-05 DIAGNOSIS — I10 Essential (primary) hypertension: Secondary | ICD-10-CM | POA: Insufficient documentation

## 2020-03-05 DIAGNOSIS — B349 Viral infection, unspecified: Secondary | ICD-10-CM

## 2020-03-05 DIAGNOSIS — R519 Headache, unspecified: Secondary | ICD-10-CM | POA: Diagnosis present

## 2020-03-05 DIAGNOSIS — Z794 Long term (current) use of insulin: Secondary | ICD-10-CM | POA: Diagnosis not present

## 2020-03-05 DIAGNOSIS — E119 Type 2 diabetes mellitus without complications: Secondary | ICD-10-CM | POA: Insufficient documentation

## 2020-03-05 DIAGNOSIS — Z79899 Other long term (current) drug therapy: Secondary | ICD-10-CM | POA: Diagnosis not present

## 2020-03-05 DIAGNOSIS — Z7984 Long term (current) use of oral hypoglycemic drugs: Secondary | ICD-10-CM | POA: Insufficient documentation

## 2020-03-05 DIAGNOSIS — U071 COVID-19: Secondary | ICD-10-CM | POA: Diagnosis not present

## 2020-03-05 LAB — POC SARS CORONAVIRUS 2 AG -  ED: SARS Coronavirus 2 Ag: NEGATIVE

## 2020-03-05 MED ORDER — ACETAMINOPHEN 325 MG PO TABS
ORAL_TABLET | ORAL | Status: AC
  Filled 2020-03-05: qty 2

## 2020-03-05 MED ORDER — KETOROLAC TROMETHAMINE 60 MG/2ML IM SOLN
30.0000 mg | Freq: Once | INTRAMUSCULAR | Status: AC
  Administered 2020-03-05: 30 mg via INTRAMUSCULAR
  Filled 2020-03-05: qty 2

## 2020-03-05 MED ORDER — NAPROXEN 500 MG PO TABS: 500.0000 mg | ORAL_TABLET | Freq: Two times a day (BID) | ORAL | 0 refills | Status: DC

## 2020-03-05 MED ORDER — ACETAMINOPHEN 325 MG PO TABS
650.0000 mg | ORAL_TABLET | Freq: Once | ORAL | Status: AC
  Administered 2020-03-05: 650 mg via ORAL

## 2020-03-05 MED ORDER — MECLIZINE HCL 25 MG PO TABS
25.0000 mg | ORAL_TABLET | Freq: Once | ORAL | Status: AC
  Administered 2020-03-05: 25 mg via ORAL
  Filled 2020-03-05: qty 1

## 2020-03-05 MED ORDER — OXYCODONE HCL 5 MG PO TABS
5.0000 mg | ORAL_TABLET | Freq: Once | ORAL | Status: AC
  Administered 2020-03-05: 5 mg via ORAL
  Filled 2020-03-05: qty 1

## 2020-03-05 MED ORDER — TRAMADOL HCL 50 MG PO TABS: 50.0000 mg | ORAL_TABLET | Freq: Four times a day (QID) | ORAL | 0 refills | Status: DC | PRN

## 2020-03-05 NOTE — ED Provider Notes (Signed)
[facility] Emergency Department Provider Note  ____________________________________________  Time seen: Approximately 8:06 PM  I have reviewed the triage vital signs and the nursing notes.   HISTORY  Chief Complaint Generalized Body Aches   HPI Michele [name] is a 55 y.o. female presents to the emergency department for treatment and evaluation of body aches, headache, chills, and subjective fever x 2 days. No known COVID exposure. No alleviating measures prior to arrival.    Past Medical History:  Diagnosis Date  . Diabetes mellitus without complication (HCC)   . Hypertension   . Migraines     Patient Active Problem List   Diagnosis Date Noted  . Lumbar facet joint syndrome 03/21/2018  . Chronic bilateral low back pain without sciatica 03/21/2018  . Chronic pain syndrome 03/21/2018  . Obesity, morbid, BMI 40.0-49.9 (HCC) 07/12/2017  . Bilateral occipital neuralgia 09/14/2016  . Neck pain 12/16/2014  . Migraines 04/28/2014  . Diabetes mellitus type 2, uncomplicated (HCC) 04/28/2014    Past Surgical History:  Procedure Laterality Date  . ABDOMINAL HYSTERECTOMY    . ANKLE SURGERY    . CESAREAN SECTION    . KNEE SURGERY    . WRIST SURGERY      Prior to Admission medications   Medication Sig Start Date End Date Taking? Authorizing Provider  naproxen (NAPROSYN) 500 MG tablet Take 1 tablet (500 mg total) by mouth 2 (two) times daily with a meal. 03/05/20  Yes Rhylie Stehr B, FNP  traMADol (ULTRAM) 50 MG tablet Take 1 tablet (50 mg total) by mouth every 6 (six) hours as needed. 03/05/20  Yes Ioannis Schuh B, FNP  amitriptyline (ELAVIL) 50 MG tablet Take 50 mg by mouth at bedtime. 12/09/18   [provider]  atorvastatin (LIPITOR) 20 MG tablet Take 20 mg by mouth daily at 6 PM.    [provider]  glipiZIDE (GLUCOTROL) 5 MG tablet Take 5 mg by mouth daily before breakfast.    [provider]  insulin glargine (LANTUS) 100  UNIT/ML injection Inject 18 Units into the skin at bedtime.     [provider]  lidocaine (XYLOCAINE) 2 % solution Use as directed 5 mLs in the mouth or throat every 6 (six) hours as needed for mouth pain. 09/24/19   Joni Reining, PA-C  meloxicam (MOBIC) 15 MG tablet Take 1 tablet (15 mg total) by mouth daily. 12/31/19   Cuthriell, Delorise Royals, PA-C  metFORMIN (GLUCOPHAGE) 1000 MG tablet Take 1 tablet (1,000 mg total) by mouth 2 (two) times daily with a meal. Patient taking differently: Take 500 mg by mouth daily.  03/29/17 05/19/19  Emily Filbert, MD  methocarbamol (ROBAXIN) 500 MG tablet Take 1 tablet (500 mg total) by mouth 4 (four) times daily. 12/31/19   Cuthriell, Delorise Royals, PA-C  oxyCODONE-acetaminophen (PERCOCET) 7.5-325 MG tablet Take 1 tablet by mouth every 6 (six) hours as needed. 09/24/19   Joni Reining, PA-C  pantoprazole (PROTONIX) 20 MG tablet Take 1 tablet (20 mg total) by mouth daily. 05/13/19 05/12/20  Jene Every, MD  predniSONE (STERAPRED UNI-PAK 21 TAB) 10 MG (21) TBPK tablet Take 6 tablets the first day, take 5 tablets the second day, take 4 tablets the third day, take 3 tablets the fourth day, take 2 tablets the fifth day, take 1 tablet the sixth day. 06/25/19   Orvil Feil, PA-C  sucralfate (CARAFATE) 1 g tablet Take 1 tablet (1 g total) by mouth 4 (four) times daily  for 15 days. 05/13/19 05/28/19  Lavonia Drafts, MD  SUMAtriptan-naproxen (TREXIMET) 85-500 MG per tablet Take 1 tablet by mouth every 2 (two) hours as needed for migraine.    [provider]  topiramate (TOPAMAX) 100 MG tablet Take 100 mg by mouth 2 (two) times daily.    [provider]  triamterene-hydrochlorothiazide (MAXZIDE-25) 37.5-25 MG per tablet Take 1 tablet by mouth daily.    [provider]  verapamil (COVERA HS) 180 MG (CO) 24 hr tablet Take 180 mg by mouth at bedtime.    [provider]    Allergies Patient has no known allergies.  Family  History  Problem Relation Age of Onset  . Breast cancer Neg Hx     Social History Social History   Tobacco Use  . Smoking status: Never Smoker  . Smokeless tobacco: Current User    Types: Snuff  Vaping Use  . Vaping Use: Never used  Substance Use Topics  . Alcohol use: Yes    Comment: occasionally  . Drug use: No    Review of Systems Constitutional: Positive for fever/chills. Decreased appetite. ENT: Negative for sore throat. Cardiovascular: Denies chest pain. Respiratory: Negative for shortness of breath. Positive for cough. Negative wheezing.  Gastrointestinal: Negative for nausea,  no vomiting.  Negative for diarrhea.  Musculoskeletal: Positive for body aches Skin: Negative for rash. Neurological: Positive for headaches and dizziness. ____________________________________________   PHYSICAL EXAM:  VITAL SIGNS: ED Triage Vitals  Enc Vitals Group     BP 03/05/20 1143 (!) 160/93     Pulse Rate 03/05/20 1143 84     Resp 03/05/20 1143 18     Temp 03/05/20 1143 (!) 101.2 F (38.4 C)     Temp Source 03/05/20 1143 Oral     SpO2 03/05/20 1143 99 %     Weight 03/05/20 1142 240 lb (108.9 kg)     Height 03/05/20 1142 5\' 6"  (1.676 m)     Head Circumference --      Peak Flow --      Pain Score 03/05/20 1141 10     Pain Loc --      Pain Edu? --      Excl. in [place]? --     Constitutional: Alert and oriented. Well appearing and in no acute distress. Eyes: Conjunctivae are normal. Ears:  TM normal Nose: No sinus congestion noted; no rhinnorhea. Mouth/Throat: Mucous membranes are moist.  Oropharynx normal. Tonsils normal. Uvula midline. Neck: No stridor.  Lymphatic: No cervical lymphadenopathy. Cardiovascular: Normal rate, regular rhythm. Good peripheral circulation. Respiratory: Respirations are even and unlabored.  No retractions. Breath sounds clear to auscultation. Gastrointestinal: Soft and nontender.  Musculoskeletal: FROM x 4 extremities.  Neurologic:  Normal  speech and language. Skin:  Skin is warm, dry and intact. No rash noted. Psychiatric: Mood and affect are normal. Speech and behavior are normal.  ____________________________________________   LABS (all labs ordered are listed, but only abnormal results are displayed)  Labs Reviewed  SARS CORONAVIRUS 2 (TAT 6-24 HRS)  POC SARS CORONAVIRUS 2 AG -  ED   ____________________________________________  EKG  Not indicated. ____________________________________________  RADIOLOGY  Not indicated. ____________________________________________   PROCEDURES  Procedure(s) performed: None  Critical Care performed: No ____________________________________________   INITIAL IMPRESSION / ASSESSMENT AND PLAN / ED COURSE  55 y.o. female presenting to the emergency department for treatment and evaluation of body aches, subjective fever, headache, dizziness.  See HPI for further details.  Rapid Covid test is negative.  Will send 6 to 24-hour test as she does have several of the Covid symptoms including fever, headache, dizziness, body aches.  She will be advised to quarantine at home until her results are back.  She is to take prescribed medications, stay well-hydrated, and quarantine.  Work excuse will be provided for the next 10 days if her Covid test is positive.  She was encouraged to return to the emergency department for symptoms of change or worsen if she is unable to see her primary care provider.    Medications  acetaminophen (TYLENOL) tablet 650 mg (650 mg Oral Given 03/05/20 1145)  meclizine (ANTIVERT) tablet 25 mg (25 mg Oral Given 03/05/20 1438)  oxyCODONE (Oxy IR/ROXICODONE) immediate release tablet 5 mg (5 mg Oral Given 03/05/20 1438)  ketorolac (TORADOL) injection 30 mg (30 mg Intramuscular Given 03/05/20 1551)    ED Discharge Orders         Ordered    naproxen (NAPROSYN) 500 MG tablet  2 times daily with meals        03/05/20 1546    traMADol (ULTRAM) 50 MG tablet  Every 6 hours  PRN        03/05/20 1546           Pertinent labs & imaging results that were available during my care of the patient were reviewed by me and considered in my medical decision making (see chart for details).    If controlled substance prescribed during this visit, 12 month history viewed on the [facility] prior to issuing an initial prescription for Schedule II or III opiod. ____________________________________________   FINAL CLINICAL IMPRESSION(S) / ED DIAGNOSES  Final diagnoses:  Acute viral syndrome    Note:  This document was prepared using Dragon voice recognition software and may include unintentional dictation errors.    Chinita Pester, FNP 03/05/20 2015    Merwyn Katos, MD 03/06/20 1800

## 2020-03-05 NOTE — Discharge Instructions (Addendum)
If COVID positive, you will need to be off for 10 days.  Please follow up with primary care if not improving over the next few days.  Return to the ER for symptoms that change or worsen if unable to schedule an appointment.

## 2020-03-05 NOTE — ED Triage Notes (Signed)
Pt comes pov with body aches for 2 days. Felt like she had a fever last night but didn't check  It.

## 2020-03-06 LAB — SARS CORONAVIRUS 2 (TAT 6-24 HRS): SARS Coronavirus 2: POSITIVE — AB

## 2020-04-01 ENCOUNTER — Emergency Department
Admission: EM | Admit: 2020-04-01 | Discharge: 2020-04-01 | Disposition: A | Discharge status: Home / Self Care | Payer: Self-pay | Attending: Emergency Medicine | Admitting: Emergency Medicine

## 2020-04-01 ENCOUNTER — Other Ambulatory Visit: Payer: Self-pay

## 2020-04-01 ENCOUNTER — Encounter: Payer: Self-pay | Admitting: Emergency Medicine

## 2020-04-01 DIAGNOSIS — Z794 Long term (current) use of insulin: Secondary | ICD-10-CM | POA: Insufficient documentation

## 2020-04-01 DIAGNOSIS — E119 Type 2 diabetes mellitus without complications: Secondary | ICD-10-CM | POA: Insufficient documentation

## 2020-04-01 DIAGNOSIS — I10 Essential (primary) hypertension: Secondary | ICD-10-CM | POA: Insufficient documentation

## 2020-04-01 DIAGNOSIS — M5441 Lumbago with sciatica, right side: Secondary | ICD-10-CM | POA: Insufficient documentation

## 2020-04-01 DIAGNOSIS — Z7984 Long term (current) use of oral hypoglycemic drugs: Secondary | ICD-10-CM | POA: Insufficient documentation

## 2020-04-01 DIAGNOSIS — M5442 Lumbago with sciatica, left side: Secondary | ICD-10-CM | POA: Insufficient documentation

## 2020-04-01 DIAGNOSIS — Z79899 Other long term (current) drug therapy: Secondary | ICD-10-CM | POA: Insufficient documentation

## 2020-04-01 DIAGNOSIS — M5432 Sciatica, left side: Secondary | ICD-10-CM

## 2020-04-01 MED ORDER — OXYCODONE-ACETAMINOPHEN 5-325 MG PO TABS
1.0000 | ORAL_TABLET | Freq: Once | ORAL | Status: AC
  Administered 2020-04-01: 1 via ORAL
  Filled 2020-04-01: qty 1

## 2020-04-01 MED ORDER — CYCLOBENZAPRINE HCL 5 MG PO TABS: 5.0000 mg | ORAL_TABLET | Freq: Three times a day (TID) | ORAL | 0 refills | Status: DC | PRN

## 2020-04-01 MED ORDER — DEXAMETHASONE SODIUM PHOSPHATE 10 MG/ML IJ SOLN
10.0000 mg | Freq: Once | INTRAMUSCULAR | Status: AC
  Administered 2020-04-01: 10 mg via INTRAMUSCULAR
  Filled 2020-04-01: qty 1

## 2020-04-01 MED ORDER — HYDROCODONE-ACETAMINOPHEN 5-325 MG PO TABS: 1.0000 | ORAL_TABLET | Freq: Four times a day (QID) | ORAL | 0 refills | Status: DC | PRN

## 2020-04-01 MED ORDER — ORPHENADRINE CITRATE 30 MG/ML IJ SOLN
60.0000 mg | INTRAMUSCULAR | Status: AC
  Administered 2020-04-01: 60 mg via INTRAMUSCULAR
  Filled 2020-04-01: qty 2

## 2020-04-01 MED ORDER — NABUMETONE 750 MG PO TABS: 750.0000 mg | ORAL_TABLET | Freq: Two times a day (BID) | ORAL | 0 refills | Status: AC

## 2020-04-01 NOTE — Discharge Instructions (Addendum)
Take the prescription meds as directed. Follow-up with your provider as needed.  ?

## 2020-04-01 NOTE — ED Notes (Signed)
No peripheral IV placed this visit.    Discharge instructions reviewed with patient. Questions fielded by this RN. Patient verbalizes understanding of instructions. Patient discharged home in stable condition per provider. No acute distress noted at time of discharge.    

## 2020-04-01 NOTE — ED Notes (Signed)
Pt asleep upon this RN's entrance to room; took a very loud voice to wake pt. Pt asked what her pain level is at. Immediately stated "it is way past a 10". Pt calm; resp reg/unlabored; skin dry.

## 2020-04-01 NOTE — ED Provider Notes (Signed)
[facility] Emergency Department Provider Note ____________________________________________  Time seen: 2030  I have reviewed the triage vital signs and the nursing notes.  HISTORY  Chief Complaint  Back Pain  HPI Leshay Desaulniers is a 55 y.o. female with a history of degenerative disc disease, presents for evaluation of acute flare of her chronic sciatica.  Patient reports  sciatic nerve pain that radiates down both legs.  She has a history of the same, that she reports flares from time to time.  She denies any but does get relief with over-the-counter medications for this particular flare.  She describes onset of symptoms about 3 days prior patient denies any recent trauma including falls, slips or trips.  She also denies any bladder or bowel incontinence, foot drop, or saddle anesthesia.  Past Medical History:  Diagnosis Date  . Diabetes mellitus without complication (HCC)   . Hypertension   . Migraines     Patient Active Problem List   Diagnosis Date Noted  . Lumbar facet joint syndrome 03/21/2018  . Chronic bilateral low back pain without sciatica 03/21/2018  . Chronic pain syndrome 03/21/2018  . Obesity, morbid, BMI 40.0-49.9 (HCC) 07/12/2017  . Bilateral occipital neuralgia 09/14/2016  . Neck pain 12/16/2014  . Migraines 04/28/2014  . Diabetes mellitus type 2, uncomplicated (HCC) 04/28/2014    Past Surgical History:  Procedure Laterality Date  . ABDOMINAL HYSTERECTOMY    . ANKLE SURGERY    . CESAREAN SECTION    . KNEE SURGERY    . WRIST SURGERY      Prior to Admission medications   Medication Sig Start Date End Date Taking? Authorizing Provider  cyclobenzaprine (FLEXERIL) 5 MG tablet Take 1 tablet (5 mg total) by mouth 3 (three) times daily as needed. 04/01/20  Yes Jeremaine Maraj, Charlesetta Ivory, PA-C  HYDROcodone-acetaminophen (NORCO) 5-325 MG tablet Take 1 tablet by mouth every 6 (six) hours as needed. 04/01/20  Yes Shenell Rogalski, Charlesetta Ivory, PA-C   nabumetone (RELAFEN) 750 MG tablet Take 1 tablet (750 mg total) by mouth 2 (two) times daily for 15 days. 04/01/20 04/16/20 Yes Jasaun Carn, Charlesetta Ivory, PA-C  amitriptyline (ELAVIL) 50 MG tablet Take 50 mg by mouth at bedtime. 12/09/18   [provider]  atorvastatin (LIPITOR) 20 MG tablet Take 20 mg by mouth daily at 6 PM.    [provider]  glipiZIDE (GLUCOTROL) 5 MG tablet Take 5 mg by mouth daily before breakfast.    [provider]  insulin glargine (LANTUS) 100 UNIT/ML injection Inject 18 Units into the skin at bedtime.     [provider]  metFORMIN (GLUCOPHAGE) 1000 MG tablet Take 1 tablet (1,000 mg total) by mouth 2 (two) times daily with a meal. Patient taking differently: Take 500 mg by mouth daily.  03/29/17 05/19/19  Emily Filbert, MD  pantoprazole (PROTONIX) 20 MG tablet Take 1 tablet (20 mg total) by mouth daily. 05/13/19 05/12/20  Jene Every, MD  sucralfate (CARAFATE) 1 g tablet Take 1 tablet (1 g total) by mouth 4 (four) times daily for 15 days. 05/13/19 05/28/19  Jene Every, MD  SUMAtriptan-naproxen (TREXIMET) 85-500 MG per tablet Take 1 tablet by mouth every 2 (two) hours as needed for migraine.    [provider]  topiramate (TOPAMAX) 100 MG tablet Take 100 mg by mouth 2 (two) times daily.    [provider]  triamterene-hydrochlorothiazide (MAXZIDE-25) 37.5-25 MG per tablet Take 1 tablet by mouth daily.    [provider]  verapamil (COVERA HS) 180 MG (CO) 24 hr tablet Take 180 mg by mouth at bedtime.    [provider]    Allergies Patient has no known allergies.  Family History  Problem Relation Age of Onset  . Breast cancer Neg Hx     Social History Social History   Tobacco Use  . Smoking status: Never Smoker  . Smokeless tobacco: Current User    Types: Snuff  Vaping Use  . Vaping Use: Never used  Substance Use Topics  . Alcohol use: Yes    Comment: occasionally  . Drug use: No     Review of Systems  Constitutional: Negative for fever. Cardiovascular: Negative for chest pain. Respiratory: Negative for shortness of breath. Gastrointestinal: Negative for abdominal pain, vomiting and diarrhea. Genitourinary: Negative for dysuria. Musculoskeletal: Positive for back pain. Reports BLE radiculopathy Skin: Negative for rash. Neurological: Negative for headaches, focal weakness or numbness. ____________________________________________  PHYSICAL EXAM:  VITAL SIGNS: ED Triage Vitals [04/01/20 1815]  Enc Vitals Group     BP 134/89     Pulse Rate 86     Resp 18     Temp (!) 97.5 F (36.4 C)     Temp Source Oral     SpO2 98 %     Weight 245 lb (111.1 kg)     Height 5\' 6"  (1.676 m)     Head Circumference      Peak Flow      Pain Score 10     Pain Loc      Pain Edu?      Excl. in [place]?     Constitutional: Alert and oriented. Well appearing and in no distress. Head: Normocephalic and atraumatic. Eyes: Conjunctivae are normal. Normal extraocular movements Neck: Supple. No thyromegaly. Cardiovascular: Normal rate, regular rhythm. Normal distal pulses. Respiratory: Normal respiratory effort. No wheezes/rales/rhonchi. Gastrointestinal: Soft and nontender. No distention. Musculoskeletal: Nontender with normal range of motion in all extremities.  Neurologic: Cranial nerves II to XII grossly intact.  Normal LE DTRs bilaterally.  Normal gait without ataxia. Normal speech and language. No gross focal neurologic deficits are appreciated. Skin:  Skin is warm, dry and intact. No rash noted. Psychiatric: Mood and affect are normal. Patient exhibits appropriate insight and judgment. 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  She presents with 3 days of right greater than left low back pain and sciatic nerve irritation.  She denies any preceding injury or trauma.  She has also denies any bladder or bowel incontinence.  Exam is benign without any red flags.  Patient without red flags on exam.  She is stable for discharge at this time, and will follow up with her primary provider.  Prescriptions for Flexeril, Relafen, and a small prescription for hydrocodone provided for her benefit.  Return precautions have been discussed.   Teshara Moree was evaluated in Emergency Department on 04/01/2020 for the symptoms described in the history of present illness. She was evaluated in the context of the global COVID-19 pandemic, which necessitated consideration that the patient might be at risk for infection with the SARS-CoV-2 virus that causes COVID-19. Institutional protocols and algorithms that pertain to the evaluation of patients at risk for COVID-19 are in a state of rapid change based on  information released by regulatory bodies including the CDC and federal and state organizations. These policies and algorithms were followed during the patient's care in the ED.  I reviewed the patient's prescription history over the last 12 months in the multi-state controlled substances database(s) that includes [place], [place], [place], [place], [place], [place], [place], [place], [place], [place], [place], [place], [place], and [place].  Results were notable for no current RX.  ____________________________________________  FINAL CLINICAL IMPRESSION(S) / ED DIAGNOSES  Final diagnoses:  Bilateral sciatica      Nashley Cordoba, Charlesetta Ivory, PA-C 04/01/20 2150    Merwyn Katos, MD 04/01/20 2330

## 2020-04-01 NOTE — ED Triage Notes (Signed)
Pt to triage via WC with c/o "sciatic nerve" pain. Pt report bilateral lower back pain that radiates down both legs and up back.  Pt states hx of same that flairs up from time to time, but she has not been able to get relief with this flair.  Pt reports symptoms for last 3 days.

## 2020-04-30 ENCOUNTER — Other Ambulatory Visit: Payer: Self-pay

## 2020-04-30 DIAGNOSIS — R531 Weakness: Secondary | ICD-10-CM | POA: Insufficient documentation

## 2020-04-30 DIAGNOSIS — E1169 Type 2 diabetes mellitus with other specified complication: Secondary | ICD-10-CM | POA: Insufficient documentation

## 2020-04-30 DIAGNOSIS — Z794 Long term (current) use of insulin: Secondary | ICD-10-CM | POA: Insufficient documentation

## 2020-04-30 DIAGNOSIS — Z79899 Other long term (current) drug therapy: Secondary | ICD-10-CM | POA: Insufficient documentation

## 2020-04-30 DIAGNOSIS — I10 Essential (primary) hypertension: Secondary | ICD-10-CM | POA: Insufficient documentation

## 2020-04-30 DIAGNOSIS — Z7984 Long term (current) use of oral hypoglycemic drugs: Secondary | ICD-10-CM | POA: Insufficient documentation

## 2020-04-30 DIAGNOSIS — H81399 Other peripheral vertigo, unspecified ear: Secondary | ICD-10-CM | POA: Insufficient documentation

## 2020-04-30 LAB — BASIC METABOLIC PANEL
Anion gap: 11 (ref 5–15)
BUN: 20 mg/dL (ref 6–20)
CO2: 21 mmol/L — ABNORMAL LOW (ref 22–32)
Calcium: 8.8 mg/dL — ABNORMAL LOW (ref 8.9–10.3)
Chloride: 104 mmol/L (ref 98–111)
Creatinine, Ser: 1.04 mg/dL — ABNORMAL HIGH (ref 0.44–1.00)
GFR, Estimated: >60 mL/min (ref >60)
Glucose, Bld: 159 mg/dL — ABNORMAL HIGH (ref 70–99)
Potassium: 3.3 mmol/L — ABNORMAL LOW (ref 3.5–5.1)
Sodium: 136 mmol/L (ref 135–145)

## 2020-04-30 LAB — CBC
HCT: 41.6 % (ref 36.0–46.0)
Hemoglobin: 13.2 g/dL (ref 12.0–15.0)
MCH: 26.3 pg (ref 26.0–34.0)
MCHC: 31.7 g/dL (ref 30.0–36.0)
MCV: 82.9 fL (ref 80.0–100.0)
Platelets: 276 10*3/uL (ref 150–400)
RBC: 5.02 MIL/uL (ref 3.87–5.11)
RDW: 13.3 % (ref 11.5–15.5)
WBC: 7.4 10*3/uL (ref 4.0–10.5)
nRBC: 0 % (ref 0.0–0.2)

## 2020-04-30 NOTE — ED Triage Notes (Signed)
Pt states she has been feeling weak and dizzy for 3 days. Pt states every time she tries to stand up she falls over. Pt states she is also having upper back pain and head pressure.

## 2020-05-01 ENCOUNTER — Emergency Department
Admission: EM | Admit: 2020-05-01 | Discharge: 2020-05-01 | Disposition: A | Discharge status: Home / Self Care | Payer: Self-pay | Attending: Emergency Medicine | Admitting: Emergency Medicine

## 2020-05-01 ENCOUNTER — Emergency Department: Payer: Self-pay

## 2020-05-01 DIAGNOSIS — H81399 Other peripheral vertigo, unspecified ear: Secondary | ICD-10-CM

## 2020-05-01 LAB — URINALYSIS, COMPLETE (UACMP) WITH MICROSCOPIC
Bacteria, UA: NONE SEEN
Bilirubin Urine: NEGATIVE
Glucose, UA: NEGATIVE mg/dL
Hgb urine dipstick: NEGATIVE
Ketones, ur: NEGATIVE mg/dL
Leukocytes,Ua: NEGATIVE
Nitrite: NEGATIVE
Protein, ur: NEGATIVE mg/dL
Specific Gravity, Urine: 1.014 (ref 1.005–1.030)
pH: 5 (ref 5.0–8.0)

## 2020-05-01 LAB — TROPONIN I (HIGH SENSITIVITY): Troponin I (High Sensitivity): <2 ng/L (ref <18)

## 2020-05-01 LAB — POC URINE PREG, ED: Preg Test, Ur: NEGATIVE

## 2020-05-01 MED ORDER — ONDANSETRON 4 MG PO TBDP: 4.0000 mg | ORAL_TABLET | Freq: Four times a day (QID) | ORAL | 0 refills | Status: DC | PRN

## 2020-05-01 MED ORDER — SODIUM CHLORIDE 0.9 % IV BOLUS (SEPSIS)
1000.0000 mL | Freq: Once | INTRAVENOUS | Status: AC
  Administered 2020-05-01: 1000 mL via INTRAVENOUS

## 2020-05-01 MED ORDER — DIAZEPAM 5 MG PO TABS: 5.0000 mg | ORAL_TABLET | Freq: Three times a day (TID) | ORAL | 0 refills | Status: DC | PRN

## 2020-05-01 MED ORDER — ONDANSETRON 4 MG PO TBDP
4.0000 mg | ORAL_TABLET | Freq: Four times a day (QID) | ORAL | 0 refills | Status: DC | PRN
Start: 2020-05-01 — End: 2020-05-01

## 2020-05-01 MED ORDER — KETOROLAC TROMETHAMINE 30 MG/ML IJ SOLN
30.0000 mg | Freq: Once | INTRAMUSCULAR | Status: AC
  Administered 2020-05-01: 30 mg via INTRAVENOUS
  Filled 2020-05-01: qty 1

## 2020-05-01 MED ORDER — DIAZEPAM 5 MG PO TABS
5.0000 mg | ORAL_TABLET | Freq: Once | ORAL | Status: AC
  Administered 2020-05-01: 5 mg via ORAL
  Filled 2020-05-01: qty 1

## 2020-05-01 MED ORDER — MECLIZINE HCL 25 MG PO TABS
25.0000 mg | ORAL_TABLET | Freq: Once | ORAL | Status: AC
  Administered 2020-05-01: 25 mg via ORAL
  Filled 2020-05-01: qty 1

## 2020-05-01 NOTE — ED Notes (Signed)
Pt unable to ambulate around room with steady gait. Pt swaying, using this RN and IV pole for support when attempting to walk. Pt unable to take any steady steps without assistance. Pt returned to bed.

## 2020-05-01 NOTE — ED Notes (Signed)
Pt given PB and crackers with okay from ED doctor

## 2020-05-01 NOTE — ED Provider Notes (Addendum)
[facility] Emergency Department Provider Note  ____________________________________________   Event Date/Time   First MD Initiated Contact with Patient 05/01/20 0147     (approximate)  I have reviewed the triage vital signs and the nursing notes.   HISTORY  Chief Complaint Weakness    HPI Michele [name] is a 55 y.o. female with history of hypertension, diabetes, migraine headaches, chronic back pain and sciatica who presents to the emergency department with complaints of vertigo for the past 3 days.  States she is having a hard time walking without feeling like she is going to fall over due to dizziness and feeling like she has lost her balance.  She states that she is having diffuse head pressure as well.  No vision changes.  Reports intermittent numbness in her right hand for the past several days as well.  Has had several weeks of numbness to the left lower extremity that is unchanged.  No focal weakness.  No bowel or bladder incontinence.  No new back pain.  No head injuries.  Not on blood thinners.  States she has medications at home for vertigo but states this feels worse than normal.  She states that her headaches feel similar to her previous migraines.  No vomiting or diarrhea.        Past Medical History:  Diagnosis Date  . Diabetes mellitus without complication (HCC)   . Hypertension   . Migraines     Patient Active Problem List   Diagnosis Date Noted  . Lumbar facet joint syndrome 03/21/2018  . Chronic bilateral low back pain without sciatica 03/21/2018  . Chronic pain syndrome 03/21/2018  . Obesity, morbid, BMI 40.0-49.9 (HCC) 07/12/2017  . Bilateral occipital neuralgia 09/14/2016  . Neck pain 12/16/2014  . Migraines 04/28/2014  . Diabetes mellitus type 2, uncomplicated (HCC) 04/28/2014    Past Surgical History:  Procedure Laterality Date  . ABDOMINAL HYSTERECTOMY    . ANKLE SURGERY    . CESAREAN SECTION    . KNEE SURGERY    .  WRIST SURGERY      Prior to Admission medications   Medication Sig Start Date End Date Taking? Authorizing Provider  diazepam (VALIUM) 5 MG tablet Take 1 tablet (5 mg total) by mouth every 8 (eight) hours as needed (vertigo). 05/01/20 05/01/21 Yes Ward, Layla MawKristen N, DO  amitriptyline (ELAVIL) 50 MG tablet Take 50 mg by mouth at bedtime. 12/09/18   [provider]  atorvastatin (LIPITOR) 20 MG tablet Take 20 mg by mouth daily at 6 PM.    [provider]  cyclobenzaprine (FLEXERIL) 5 MG tablet Take 1 tablet (5 mg total) by mouth 3 (three) times daily as needed. 04/01/20   Menshew, Charlesetta IvoryJenise V Bacon, PA-C  glipiZIDE (GLUCOTROL) 5 MG tablet Take 5 mg by mouth daily before breakfast.    [provider]  HYDROcodone-acetaminophen (NORCO) 5-325 MG tablet Take 1 tablet by mouth every 6 (six) hours as needed. 04/01/20   Menshew, Charlesetta IvoryJenise V Bacon, PA-C  insulin glargine (LANTUS) 100 UNIT/ML injection Inject 18 Units into the skin at bedtime.     [provider]  metFORMIN (GLUCOPHAGE) 1000 MG tablet Take 1 tablet (1,000 mg total) by mouth 2 (two) times daily with a meal. Patient taking differently: Take 500 mg by mouth daily.  03/29/17 05/19/19  Emily FilbertWilliams, Jonathan E, MD  ondansetron (ZOFRAN ODT) 4 MG disintegrating tablet Take 1 tablet (4 mg total) by mouth every 6 (six) hours as needed for nausea  or vomiting. 05/01/20   Ward, Layla Maw, DO  pantoprazole (PROTONIX) 20 MG tablet Take 1 tablet (20 mg total) by mouth daily. 05/13/19 05/12/20  Jene Every, MD  sucralfate (CARAFATE) 1 g tablet Take 1 tablet (1 g total) by mouth 4 (four) times daily for 15 days. 05/13/19 05/28/19  Jene Every, MD  SUMAtriptan-naproxen (TREXIMET) 85-500 MG per tablet Take 1 tablet by mouth every 2 (two) hours as needed for migraine.    [provider]  topiramate (TOPAMAX) 100 MG tablet Take 100 mg by mouth 2 (two) times daily.    [provider]  triamterene-hydrochlorothiazide  (MAXZIDE-25) 37.5-25 MG per tablet Take 1 tablet by mouth daily.    [provider]  verapamil (COVERA HS) 180 MG (CO) 24 hr tablet Take 180 mg by mouth at bedtime.    [provider]    Allergies Patient has no known allergies.  Family History  Problem Relation Age of Onset  . Breast cancer Neg Hx     Social History Social History   Tobacco Use  . Smoking status: Never Smoker  . Smokeless tobacco: Current User    Types: Snuff  Vaping Use  . Vaping Use: Never used  Substance Use Topics  . Alcohol use: Yes    Comment: occasionally  . Drug use: No    Review of Systems Constitutional: No fever. Eyes: No visual changes. ENT: No sore throat. Cardiovascular: Denies chest pain. Respiratory: Denies shortness of breath. Gastrointestinal: No nausea, vomiting, diarrhea. Genitourinary: Negative for dysuria. Musculoskeletal: Negative for back pain. Skin: Negative for rash. Neurological: Negative for focal weakness or numbness.  ____________________________________________   PHYSICAL EXAM:  VITAL SIGNS: ED Triage Vitals  Enc Vitals Group     BP 04/30/20 2210 (!) 130/98     Pulse Rate 04/30/20 2210 72     Resp 04/30/20 2210 16     Temp 04/30/20 2210 98.7 F (37.1 C)     Temp src --      SpO2 04/30/20 2210 98 %     Weight 04/30/20 2208 245 lb 6 oz (111.3 kg)     Height 04/30/20 2208 5\' 6"  (1.676 m)     Head Circumference --      Peak Flow --      Pain Score 04/30/20 2208 10     Pain Loc --      Pain Edu? --      Excl. in GC? --    CONSTITUTIONAL: Alert and oriented and responds appropriately to questions. Well-appearing; well-nourished, obese HEAD: Normocephalic, atraumatic EYES: Conjunctivae clear, pupils appear equal, EOM appear intact, no nystagmus ENT: normal nose; moist mucous membranes NECK: Supple, normal ROM CARD: RRR; S1 and S2 appreciated; no murmurs, no clicks, no rubs, no gallops RESP: Normal chest excursion without splinting or  tachypnea; breath sounds clear and equal bilaterally; no wheezes, no rhonchi, no rales, no hypoxia or respiratory distress, speaking full sentences ABD/GI: Normal bowel sounds; non-distended; soft, non-tender, no rebound, no guarding, no peritoneal signs, no hepatosplenomegaly BACK: The back appears normal, no midline spinal tenderness or step off or deformity EXT: Normal ROM in all joints; no deformity noted, no edema; no cyanosis SKIN: Normal color for age and race; warm; no rash on exposed skin NEURO: Moves all extremities equally, strength 5/5 all 4 extremities, cranial nerves II to XII intact, normal speech, no nystagmus, gait deferred due to dizziness, no sensory deficits on exam, no saddle anesthesia PSYCH: The patient's mood and manner are  appropriate.  ____________________________________________   LABS (all labs ordered are listed, but only abnormal results are displayed)  Labs Reviewed  BASIC METABOLIC PANEL - Abnormal; Notable for the following components:      Result Value   Potassium 3.3 (*)    CO2 21 (*)    Glucose, Bld 159 (*)    Creatinine, Ser 1.04 (*)    Calcium 8.8 (*)    All other components within normal limits  URINALYSIS, COMPLETE (UACMP) WITH MICROSCOPIC - Abnormal; Notable for the following components:   Color, Urine YELLOW (*)    APPearance HAZY (*)    All other components within normal limits  CBC  POC URINE PREG, ED  TROPONIN I (HIGH SENSITIVITY)   ____________________________________________  EKG  none ____________________________________________  RADIOLOGY I, Kristen Ward, personally viewed and evaluated these images (plain radiographs) as part of my medical decision making, as well as reviewing the written report by the radiologist.  ED MD interpretation: MRI brain shows no acute abnormality.  Official radiology report(s): MR BRAIN WO CONTRAST  Result Date: 05/01/2020 CLINICAL DATA:  55 year old female with dizziness, weakness for 3 days.  Head pressure, back pain. EXAM: MRI HEAD WITHOUT CONTRAST TECHNIQUE: Multiplanar, multiecho pulse sequences of the brain and surrounding structures were obtained without intravenous contrast. COMPARISON:  Head CT 02/24/2017 and earlier.  Brain MRI 04/13/2008. FINDINGS: Brain: Cerebral volume remains normal. No restricted diffusion to suggest acute infarction. No midline shift, mass effect, evidence of mass lesion, ventriculomegaly, extra-axial collection or acute intracranial hemorrhage. Cervicomedullary junction within normal limits. Chronic partially empty sella, stable since 2010. Wallace Cullens and white matter signal is within normal limits for age throughout the brain. No cortical encephalomalacia or chronic cerebral blood products. Deep gray nuclei, brainstem and cerebellum appear normal. Vascular: Major intracranial vascular flow voids are stable since 2010. chronically tortuous and mildly dominant distal left vertebral artery. Skull and upper cervical spine: Negative visible cervical spine. Visualized bone marrow signal is within normal limits. Sinuses/Orbits: Negative orbits. Chronic mucous retention cyst or inspissated stool secretions in the medial right maxillary sinus, unchanged since 2010. Other sinuses are clear. Other: Mastoid air cells are clear. Grossly normal visible internal auditory structures. Negative visible face and scalp. IMPRESSION: 1. No acute intracranial abnormality. 2. Stable since 2010 and largely unremarkable noncontrast MRI appearance of the brain - positive only for chronic partially empty sella, which can be a normal anatomic variant but can also be associated with idiopathic intracranial hypertension (pseudotumor cerebri). Electronically Signed   By: Odessa Fleming M.D.   On: 05/01/2020 04:14    ____________________________________________   PROCEDURES  Procedure(s) performed (including Critical Care):  Procedures   ____________________________________________   INITIAL  IMPRESSION / ASSESSMENT AND PLAN / ED COURSE  As part of my medical decision making, I reviewed the following data within the electronic MEDICAL RECORD NUMBER Nursing notes reviewed and incorporated, Labs reviewed, Old chart reviewed and Notes from prior ED visits         Patient here with what I suspect is vertigo.  Labs obtained in triage are unremarkable.  Normal hemoglobin, electrolytes.  Pregnancy test negative.  We will add on troponin however she denies chest pain, shortness of breath.  States she is unable to ambulate and reports this feels worse than previous episodes of vertigo.  She also is complaining of intermittent right hand numbness which is new for her.  Will obtain MRI of the brain to ensure that this is not central vertigo/CVA.  Will give IV  fluids, meclizine.  She is also complaining of a headache and has history of migraines.  Not sudden in onset.  No fever or meningismus.  Will give Toradol, IVF.  Low suspicion for intracranial hemorrhage, SAH, meningitis, encephalitis, CVT.  ED PROGRESS  Patient reports headache is improved with IV fluids, Toradol.  Her MRI shows no acute intracranial abnormality but questions possibility of pseudotumor due to chronic partially empty sella but stable since 2010.  No vision changes today.  She is still not able to ambulate however after meclizine due to vertigo.  Will give Valium and reassess.   Patient given Valium and reports feeling much better.  Able to eat and drink here.  Able to ambulate without assistance with steady gait.  I feel that this is peripheral vertigo and she is safe to be discharged home with prescription of Valium.  She is comfortable with this plan.  Discussed return precautions.  At this time, I do not feel there is any life-threatening condition present. I have reviewed, interpreted and discussed all results (EKG, imaging, lab, urine as appropriate) and exam findings with patient/family. I have reviewed nursing notes and  appropriate previous records.  I feel the patient is safe to be discharged home without further emergent workup and can continue workup as an outpatient as needed. Discussed usual and customary return precautions. Patient/family verbalize understanding and are comfortable with this plan.  Outpatient follow-up has been provided as needed. All questions have been answered.  ____________________________________________   FINAL CLINICAL IMPRESSION(S) / ED DIAGNOSES  Final diagnoses:  Peripheral vertigo, unspecified laterality     ED Discharge Orders         Ordered    diazepam (VALIUM) 5 MG tablet  Every 8 hours PRN        05/01/20 0534    ondansetron (ZOFRAN ODT) 4 MG disintegrating tablet  Every 6 hours PRN,   Status:  Discontinued        05/01/20 0534    ondansetron (ZOFRAN ODT) 4 MG disintegrating tablet  Every 6 hours PRN        05/01/20 0534          *Please note:  Annamary Buschman was evaluated in Emergency Department on 05/01/2020 for the symptoms described in the history of present illness. She was evaluated in the context of the global COVID-19 pandemic, which necessitated consideration that the patient might be at risk for infection with the SARS-CoV-2 virus that causes COVID-19. Institutional protocols and algorithms that pertain to the evaluation of patients at risk for COVID-19 are in a state of rapid change based on information released by regulatory bodies including the CDC and federal and state organizations. These policies and algorithms were followed during the patient's care in the ED.  Some ED evaluations and interventions may be delayed as a result of limited staffing during and the pandemic.*   Note:  This document was prepared using Dragon voice recognition software and may include unintentional dictation errors.       Ward, Layla Maw, DO 05/01/20 [phone]

## 2020-05-01 NOTE — ED Notes (Signed)
Pt able to ambulate unassisted around room at this time. Pt reports improvement in symptoms and reports she feels safe to be discharged home.

## 2020-06-01 ENCOUNTER — Other Ambulatory Visit: Payer: Self-pay

## 2020-06-01 ENCOUNTER — Emergency Department
Admission: EM | Admit: 2020-06-01 | Discharge: 2020-06-01 | Disposition: A | Discharge status: Home / Self Care | Payer: Self-pay | Attending: Emergency Medicine | Admitting: Emergency Medicine

## 2020-06-01 ENCOUNTER — Emergency Department: Payer: Self-pay

## 2020-06-01 DIAGNOSIS — Z794 Long term (current) use of insulin: Secondary | ICD-10-CM | POA: Insufficient documentation

## 2020-06-01 DIAGNOSIS — M5431 Sciatica, right side: Secondary | ICD-10-CM

## 2020-06-01 DIAGNOSIS — M5442 Lumbago with sciatica, left side: Secondary | ICD-10-CM | POA: Insufficient documentation

## 2020-06-01 DIAGNOSIS — Z7984 Long term (current) use of oral hypoglycemic drugs: Secondary | ICD-10-CM | POA: Insufficient documentation

## 2020-06-01 DIAGNOSIS — I10 Essential (primary) hypertension: Secondary | ICD-10-CM | POA: Insufficient documentation

## 2020-06-01 DIAGNOSIS — E119 Type 2 diabetes mellitus without complications: Secondary | ICD-10-CM | POA: Insufficient documentation

## 2020-06-01 DIAGNOSIS — Z79899 Other long term (current) drug therapy: Secondary | ICD-10-CM | POA: Insufficient documentation

## 2020-06-01 DIAGNOSIS — M5441 Lumbago with sciatica, right side: Secondary | ICD-10-CM | POA: Insufficient documentation

## 2020-06-01 DIAGNOSIS — M5432 Sciatica, left side: Secondary | ICD-10-CM

## 2020-06-01 MED ORDER — ORPHENADRINE CITRATE 30 MG/ML IJ SOLN
60.0000 mg | Freq: Two times a day (BID) | INTRAMUSCULAR | Status: DC
  Administered 2020-06-01: 60 mg via INTRAMUSCULAR
  Filled 2020-06-01: qty 2

## 2020-06-01 MED ORDER — CYCLOBENZAPRINE HCL 5 MG PO TABS: 5.0000 mg | ORAL_TABLET | Freq: Three times a day (TID) | ORAL | 0 refills | Status: AC | PRN

## 2020-06-01 MED ORDER — OXYCODONE-ACETAMINOPHEN 5-325 MG PO TABS
1.0000 | ORAL_TABLET | Freq: Once | ORAL | Status: AC
  Administered 2020-06-01: 1 via ORAL
  Filled 2020-06-01: qty 1

## 2020-06-01 MED ORDER — KETOROLAC TROMETHAMINE 60 MG/2ML IM SOLN
30.0000 mg | Freq: Once | INTRAMUSCULAR | Status: AC
  Administered 2020-06-01: 30 mg via INTRAMUSCULAR
  Filled 2020-06-01: qty 2

## 2020-06-01 MED ORDER — KETOROLAC TROMETHAMINE 10 MG PO TABS: 10.0000 mg | ORAL_TABLET | Freq: Four times a day (QID) | ORAL | 0 refills | Status: AC | PRN

## 2020-06-01 NOTE — Discharge Instructions (Addendum)
You have been prescribed Toradol tablets.  Please do not take these longer than 5 days.  You have also been prescribed Flexeril, a muscle relaxant.  Please do not drive or operate heavy machinery on this medicine.  You may also use Tylenol, up to 1000 mg 4 times daily for pain.  Follow-up with primary care or return to ER for any worsening.

## 2020-06-01 NOTE — ED Notes (Signed)
Pt assessed resting in bed on her side with blanket. States she is needing something for pain. Will address with PA after she comes out of pt room.

## 2020-06-01 NOTE — ED Provider Notes (Signed)
[facility] Emergency Department Provider Note  ___________________________________________   Event Date/Time   First MD Initiated Contact with Patient 06/01/20 [phone]     (approximate)  I have reviewed the triage vital signs and the nursing notes.   HISTORY  Chief Complaint Back Pain  HPI Michele [name] is a 55 y.o. female he reports to the emergency department for evaluation of back pain and bilateral hip pain that radiates down the legs, worse on left than right.  Reports it has been present over the last 3 months.  She states that she was treated here for similar symptoms previously, had improvement with the medications that she was given, but reports that she did not have significant lasting of this medication.  She does not endorse any new trauma.  She denies any fevers, loss of bowel or bladder control or weakness down the legs.       Past Medical History:  Diagnosis Date  . Diabetes mellitus without complication (HCC)   . Hypertension   . Migraines     Patient Active Problem List   Diagnosis Date Noted  . Lumbar facet joint syndrome 03/21/2018  . Chronic bilateral low back pain without sciatica 03/21/2018  . Chronic pain syndrome 03/21/2018  . Obesity, morbid, BMI 40.0-49.9 (HCC) 07/12/2017  . Bilateral occipital neuralgia 09/14/2016  . Neck pain 12/16/2014  . Migraines 04/28/2014  . Diabetes mellitus type 2, uncomplicated (HCC) 04/28/2014    Past Surgical History:  Procedure Laterality Date  . ABDOMINAL HYSTERECTOMY    . ANKLE SURGERY    . CESAREAN SECTION    . KNEE SURGERY    . WRIST SURGERY      Prior to Admission medications   Medication Sig Start Date End Date Taking? Authorizing Provider  cyclobenzaprine (FLEXERIL) 5 MG tablet Take 1 tablet (5 mg total) by mouth 3 (three) times daily as needed for up to 5 days for muscle spasms. 06/01/20 06/06/20 Yes Tulip Meharg, Ruben Gottron, PA  ketorolac (TORADOL) 10 MG tablet Take 1 tablet (10 mg  total) by mouth every 6 (six) hours as needed for up to 5 days. 06/01/20 06/06/20 Yes Breanda Greenlaw, Ruben Gottron, PA  amitriptyline (ELAVIL) 50 MG tablet Take 50 mg by mouth at bedtime. 12/09/18   [provider]  atorvastatin (LIPITOR) 20 MG tablet Take 20 mg by mouth daily at 6 PM.    [provider]  diazepam (VALIUM) 5 MG tablet Take 1 tablet (5 mg total) by mouth every 8 (eight) hours as needed (vertigo). 05/01/20 05/01/21  Ward, Layla Maw, DO  glipiZIDE (GLUCOTROL) 5 MG tablet Take 5 mg by mouth daily before breakfast.    [provider]  HYDROcodone-acetaminophen (NORCO) 5-325 MG tablet Take 1 tablet by mouth every 6 (six) hours as needed. 04/01/20   Menshew, Charlesetta Ivory, PA-C  insulin glargine (LANTUS) 100 UNIT/ML injection Inject 18 Units into the skin at bedtime.     [provider]  metFORMIN (GLUCOPHAGE) 1000 MG tablet Take 1 tablet (1,000 mg total) by mouth 2 (two) times daily with a meal. Patient taking differently: Take 500 mg by mouth daily.  03/29/17 05/19/19  Emily Filbert, MD  ondansetron (ZOFRAN ODT) 4 MG disintegrating tablet Take 1 tablet (4 mg total) by mouth every 6 (six) hours as needed for nausea or vomiting. 05/01/20   Ward, Layla Maw, DO  pantoprazole (PROTONIX) 20 MG tablet Take 1 tablet (20 mg total) by mouth daily. 05/13/19 05/12/20  Jene Every,  MD  sucralfate (CARAFATE) 1 g tablet Take 1 tablet (1 g total) by mouth 4 (four) times daily for 15 days. 05/13/19 05/28/19  Jene EveryKinner, Robert, MD  SUMAtriptan-naproxen (TREXIMET) 85-500 MG per tablet Take 1 tablet by mouth every 2 (two) hours as needed for migraine.    [provider]  topiramate (TOPAMAX) 100 MG tablet Take 100 mg by mouth 2 (two) times daily.    [provider]  triamterene-hydrochlorothiazide (MAXZIDE-25) 37.5-25 MG per tablet Take 1 tablet by mouth daily.    [provider]  verapamil (COVERA HS) 180 MG (CO) 24 hr tablet Take 180 mg by mouth at bedtime.     [provider]    Allergies Patient has no known allergies.  Family History  Problem Relation Age of Onset  . Breast cancer Neg Hx     Social History Social History   Tobacco Use  . Smoking status: Never Smoker  . Smokeless tobacco: Current User    Types: Snuff  Vaping Use  . Vaping Use: Never used  Substance Use Topics  . Alcohol use: Yes    Comment: occasionally  . Drug use: No    Review of Systems Constitutional: No fever/chills Eyes: No visual changes. ENT: No sore throat. Cardiovascular: Denies chest pain. Respiratory: Denies shortness of breath. Gastrointestinal: No abdominal pain.  No nausea, no vomiting.  No diarrhea.  No constipation. Genitourinary: Negative for dysuria. Musculoskeletal: + for back pain, + bilateral leg pain Skin: Negative for rash. Neurological: Negative for headaches, focal weakness or numbness.   ____________________________________________   PHYSICAL EXAM:  VITAL SIGNS: ED Triage Vitals [06/01/20 0827]  Enc Vitals Group     BP (!) 132/101     Pulse Rate 81     Resp 16     Temp 98.2 F (36.8 C)     Temp Source Oral     SpO2 93 %     Weight 245 lb (111.1 kg)     Height 5\' 6"  (1.676 m)     Head Circumference      Peak Flow      Pain Score 10     Pain Loc      Pain Edu?      Excl. in GC?    Constitutional: Alert and oriented. Well appearing and in no acute distress. Eyes: Conjunctivae are normal. PERRL. EOMI. Head: Atraumatic. Nose: No congestion/rhinnorhea. Mouth/Throat: Mucous membranes are moist.   Neck: No stridor.   Cardiovascular: Normal rate, regular rhythm. Grossly normal heart sounds.  Good peripheral circulation. Respiratory: Normal respiratory effort.  No retractions. Lungs CTAB. Gastrointestinal: Soft and nontender. No distention. No abdominal bruits. No CVA tenderness. Musculoskeletal: There is tenderness to palpation midline of the lumbar spine as well as left SI joint region.  Minimal paraspinal  tenderness bilaterally.  positive straight leg raise bilaterally, worse on left than right.  5/5 strength bilaterally in ankle plantarflexion, dorsiflexion, knee flexion and extension, hip flexion. Neurologic:  Normal speech and language. No gross focal neurologic deficits are appreciated. No gait instability. Skin:  Skin is warm, dry and intact. No rash noted. Psychiatric: Mood and affect are normal. Speech and behavior are normal.   ____________________________________________  RADIOLOGY I, Lucy Chrisaitlin J Westly Hinnant, personally viewed and evaluated these images (plain radiographs) as part of my medical decision making, as well as reviewing the written report by the radiologist.  ED provider interpretation: Mild arthritic/degenerative changes noted in the lumbar spine at L4-L5, no acute fractures  Official radiology report(s):  DG Lumbar Spine 2-3 Views  Result Date: 06/01/2020 CLINICAL DATA:  Back pain. EXAM: LUMBAR SPINE - 2-3 VIEW COMPARISON:  MRI 11/20/2018.  Lumbar spine series 12/16/2017. FINDINGS: Lumbar spine numbered with the lowest segmented appearing lumbar shaped vertebrae on lateral view as L5. Paraspinal soft tissues are unremarkable. Mild L4-L5 degenerative endplate osteophyte formation. No acute bony abnormality identified. IMPRESSION: Mild L4-L5 degenerative changes.  No acute abnormality identified. Electronically Signed   By: Maisie Fus  Register   On: 06/01/2020 09:39   ____________________________________________   INITIAL IMPRESSION / ASSESSMENT AND PLAN / ED COURSE  As part of my medical decision making, I reviewed the following data within the electronic MEDICAL RECORD NUMBER Nursing notes reviewed and incorporated, Radiograph reviewed and Notes from prior ED visits        Patient is a 55 year old female who presents to the emergency department for evaluation of acute on chronic low back pain with radiation down the bilateral legs, worse on left than right.  She denies any fever,  loss of bowel or bladder control or weakness on the legs.  See HPI for further details.  In triage, patient is mildly hypertensive otherwise has normal vital signs.  On physical exam, she is mildly tender in the lumbar region, but maintains 5/5 strength in the bilateral lower extremities.  She does have a positive straight leg raise bilaterally, worse on left than right.  X-ray was obtained and demonstrates mild degenerative changes, but no acute fractures or other acute abnormalities.  Review of the patient's chart states she was previously treated with IM Decadron, IM Norflex and a dose of Percocet in the hospital followed by outpatient steroid and muscle relaxer.  This was less than 2 months ago, and she does report to me a history of diabetes.  Will attempt trial of Toradol with muscle relaxant interfacility followed by home tablets of Toradol and muscle relaxer.  Advised that she should have close follow-up with primary care and patient is amenable with this plan.  Note, below states the patient was prescribed Flexeril today given that she previously reported benefit from this from prior ER visit.  However, the pharmacy contacted me regarding the interaction with her amitriptyline provided by another provider.  Given this interaction, verbal order was given to the pharmacy to change this to Robaxin instead.      ____________________________________________   FINAL CLINICAL IMPRESSION(S) / ED DIAGNOSES  Final diagnoses:  Bilateral sciatica     ED Discharge Orders         Ordered    ketorolac (TORADOL) 10 MG tablet  Every 6 hours PRN        06/01/20 1020    cyclobenzaprine (FLEXERIL) 5 MG tablet  3 times daily PRN        06/01/20 1020          *Please note:  Shaeley Segall was evaluated in Emergency Department on 06/01/2020 for the symptoms described in the history of present illness. She was evaluated in the context of the global COVID-19 pandemic, which necessitated consideration  that the patient might be at risk for infection with the SARS-CoV-2 virus that causes COVID-19. Institutional protocols and algorithms that pertain to the evaluation of patients at risk for COVID-19 are in a state of rapid change based on information released by regulatory bodies including the CDC and federal and state organizations. These policies and algorithms were followed during the patient's care in the ED.  Some ED evaluations and interventions may be delayed  as a result of limited staffing during and the pandemic.*   Note:  This document was prepared using Dragon voice recognition software and may include unintentional dictation errors.   Lucy Chris, PA 06/02/20 [id number]    Concha Se, MD 06/02/20 0830

## 2020-06-01 NOTE — ED Triage Notes (Signed)
Reports lower back pain and upper leg/hip pain bilaterally. States pain with weight bearing. Skin warm and dry. Pain ongoing and recurrent, reports left leg has been worse than normal over last 3 months. Hx of sciatic nerve problems as well.

## 2020-06-01 NOTE — ED Notes (Signed)
Pt assisted to wheelchair and taken to lobby for her mother to take her home. Tolerated well with assist.

## 2020-06-01 NOTE — ED Notes (Signed)
Med hold until 1050.

## 2020-07-04 ENCOUNTER — Encounter: Payer: Self-pay | Admitting: Emergency Medicine

## 2020-07-04 ENCOUNTER — Emergency Department
Admission: EM | Admit: 2020-07-04 | Discharge: 2020-07-04 | Disposition: A | Discharge status: Home / Self Care | Payer: Self-pay | Attending: Emergency Medicine | Admitting: Emergency Medicine

## 2020-07-04 DIAGNOSIS — Z79899 Other long term (current) drug therapy: Secondary | ICD-10-CM | POA: Insufficient documentation

## 2020-07-04 DIAGNOSIS — Z794 Long term (current) use of insulin: Secondary | ICD-10-CM | POA: Insufficient documentation

## 2020-07-04 DIAGNOSIS — I10 Essential (primary) hypertension: Secondary | ICD-10-CM | POA: Insufficient documentation

## 2020-07-04 DIAGNOSIS — M5440 Lumbago with sciatica, unspecified side: Secondary | ICD-10-CM | POA: Insufficient documentation

## 2020-07-04 DIAGNOSIS — F17290 Nicotine dependence, other tobacco product, uncomplicated: Secondary | ICD-10-CM | POA: Insufficient documentation

## 2020-07-04 DIAGNOSIS — M5430 Sciatica, unspecified side: Secondary | ICD-10-CM

## 2020-07-04 DIAGNOSIS — E119 Type 2 diabetes mellitus without complications: Secondary | ICD-10-CM | POA: Insufficient documentation

## 2020-07-04 DIAGNOSIS — Z7984 Long term (current) use of oral hypoglycemic drugs: Secondary | ICD-10-CM | POA: Insufficient documentation

## 2020-07-04 MED ORDER — CYCLOBENZAPRINE HCL 5 MG PO TABS: 5.0000 mg | ORAL_TABLET | Freq: Three times a day (TID) | ORAL | 0 refills | Status: DC | PRN

## 2020-07-04 MED ORDER — DEXAMETHASONE SODIUM PHOSPHATE 10 MG/ML IJ SOLN
10.0000 mg | Freq: Once | INTRAMUSCULAR | Status: AC
  Administered 2020-07-04: 10 mg via INTRAMUSCULAR
  Filled 2020-07-04: qty 1

## 2020-07-04 MED ORDER — ORPHENADRINE CITRATE 30 MG/ML IJ SOLN
60.0000 mg | INTRAMUSCULAR | Status: AC
  Administered 2020-07-04: 60 mg via INTRAMUSCULAR
  Filled 2020-07-04: qty 2

## 2020-07-04 MED ORDER — HYDROCODONE-ACETAMINOPHEN 5-325 MG PO TABS: 1.0000 | ORAL_TABLET | Freq: Three times a day (TID) | ORAL | 0 refills | Status: DC | PRN

## 2020-07-04 MED ORDER — HYDROCODONE-ACETAMINOPHEN 5-325 MG PO TABS
1.0000 | ORAL_TABLET | Freq: Once | ORAL | Status: AC
Start: 2020-07-04 — End: 2020-07-04
  Administered 2020-07-04: 1 via ORAL
  Filled 2020-07-04: qty 1

## 2020-07-04 NOTE — ED Provider Notes (Signed)
[facility] Emergency Department Provider Note ____________________________________________  Time seen: 2245  I have reviewed the triage vital signs and the nursing notes.  HISTORY  Chief Complaint  Spasms   HPI Michele [name] is a 55 y.o. female presents her self to the ED for evaluation of bilateral low back pain with bilateral lower extremity referral.  Patient with a history of DDD and sciatica, presents for an acute flare.  She denies any recent injury, fall, trauma.  She also denies any bladder or bowel incontinence, foot drop, or saddle anesthesia.  She reports her most recent ED visit she was given a shot of Toradol, but denies any significant relief from that medication.  She has not been able to follow-up with orthospine and neurology due to insurance coverage issues.  She denies any fever, chills, chest pain, or shortness of breath.   Past Medical History:  Diagnosis Date  . Diabetes mellitus without complication (HCC)   . Hypertension   . Migraines     Patient Active Problem List   Diagnosis Date Noted  . Lumbar facet joint syndrome 03/21/2018  . Chronic bilateral low back pain without sciatica 03/21/2018  . Chronic pain syndrome 03/21/2018  . Obesity, morbid, BMI 40.0-49.9 (HCC) 07/12/2017  . Bilateral occipital neuralgia 09/14/2016  . Neck pain 12/16/2014  . Migraines 04/28/2014  . Diabetes mellitus type 2, uncomplicated (HCC) 04/28/2014    Past Surgical History:  Procedure Laterality Date  . ABDOMINAL HYSTERECTOMY    . ANKLE SURGERY    . CESAREAN SECTION    . KNEE SURGERY    . WRIST SURGERY      Prior to Admission medications   Medication Sig Start Date End Date Taking? Authorizing Provider  cyclobenzaprine (FLEXERIL) 5 MG tablet Take 1 tablet (5 mg total) by mouth 3 (three) times daily as needed for muscle spasms. 07/04/20  Yes Amoy Steeves, Charlesetta Ivory, PA-C  HYDROcodone-acetaminophen (NORCO) 5-325 MG tablet Take 1 tablet by mouth 3  (three) times daily as needed. 07/04/20  Yes Mineola Duan, Charlesetta Ivory, PA-C  amitriptyline (ELAVIL) 50 MG tablet Take 50 mg by mouth at bedtime. 12/09/18   [provider]  atorvastatin (LIPITOR) 20 MG tablet Take 20 mg by mouth daily at 6 PM.    [provider]  glipiZIDE (GLUCOTROL) 5 MG tablet Take 5 mg by mouth daily before breakfast.    [provider]  insulin glargine (LANTUS) 100 UNIT/ML injection Inject 18 Units into the skin at bedtime.     [provider]  metFORMIN (GLUCOPHAGE) 1000 MG tablet Take 1 tablet (1,000 mg total) by mouth 2 (two) times daily with a meal. Patient taking differently: Take 500 mg by mouth daily.  03/29/17 05/19/19  Emily Filbert, MD  ondansetron (ZOFRAN ODT) 4 MG disintegrating tablet Take 1 tablet (4 mg total) by mouth every 6 (six) hours as needed for nausea or vomiting. 05/01/20   Ward, Layla Maw, DO  pantoprazole (PROTONIX) 20 MG tablet Take 1 tablet (20 mg total) by mouth daily. 05/13/19 05/12/20  Jene Every, MD  sucralfate (CARAFATE) 1 g tablet Take 1 tablet (1 g total) by mouth 4 (four) times daily for 15 days. 05/13/19 05/28/19  Jene Every, MD  SUMAtriptan-naproxen (TREXIMET) 85-500 MG per tablet Take 1 tablet by mouth every 2 (two) hours as needed for migraine.    [provider]  topiramate (TOPAMAX) 100 MG tablet Take 100 mg by mouth 2 (two) times daily.  [provider]  triamterene-hydrochlorothiazide (MAXZIDE-25) 37.5-25 MG per tablet Take 1 tablet by mouth daily.    [provider]  verapamil (COVERA HS) 180 MG (CO) 24 hr tablet Take 180 mg by mouth at bedtime.    [provider]    Allergies Patient has no known allergies.  Family History  Problem Relation Age of Onset  . Breast cancer Neg Hx     Social History Social History   Tobacco Use  . Smoking status: Never Smoker  . Smokeless tobacco: Current User    Types: Snuff  Vaping Use  . Vaping Use: Never  used  Substance Use Topics  . Alcohol use: Yes    Comment: occasionally  . Drug use: No    Review of Systems  Constitutional: Negative for fever. Eyes: Negative for visual changes. ENT: Negative for sore throat. Cardiovascular: Negative for chest pain. Respiratory: Negative for shortness of breath. Gastrointestinal: Negative for abdominal pain, vomiting and diarrhea. Genitourinary: Negative for dysuria. Musculoskeletal: Negative for back pain. Skin: Negative for rash. Neurological: Negative for headaches, focal weakness or numbness.  Reports lower extremity symptoms as above. ____________________________________________  PHYSICAL EXAM:  VITAL SIGNS: ED Triage Vitals [07/04/20 1908]  Enc Vitals Group     BP (!) 152/93     Pulse Rate 80     Resp 20     Temp 98.4 F (36.9 C)     Temp Source Oral     SpO2 98 %     Weight 250 lb (113.4 kg)     Height 5\' 6"  (1.676 m)     Head Circumference      Peak Flow      Pain Score      Pain Loc      Pain Edu?      Excl. in GC?     Constitutional: Alert and oriented. Well appearing and in no distress. Head: Normocephalic and atraumatic. Eyes: Conjunctivae are normal.  Normal extraocular movements Cardiovascular: Normal rate, regular rhythm. Normal distal pulses. Respiratory: Normal respiratory effort. No wheezes/rales/rhonchi. Gastrointestinal: Soft and nontender. No distention. Musculoskeletal: Normal spinal alignment without midline tenderness, spasm, deformity, or step-off.  Nontender with normal range of motion in all extremities.  She was somewhat tenderness to palpation over the bilateral SI joints with referral down the posterior legs. Neurologic: Cranial nerves II to XII grossly intact.  Normal gait without ataxia. Normal speech and language. No gross focal neurologic deficits are appreciated. Skin:  Skin is warm, dry and intact. No rash noted. ____________________________________________  PROCEDURES  Decadron 10 mg  IM Norco 5-325 mg PO Norflex 60 mg IM  Procedures ____________________________________________   INITIAL IMPRESSION / ASSESSMENT AND PLAN / ED COURSE  As part of my medical decision making, I reviewed the following data within the electronic MEDICAL RECORD NUMBER Notes from prior ED visits and [place] Controlled Substance Database    Patient ED evaluation of acute on chronic flare of her sciatica irritation.  Patient without any red flags on exam, presents for muscle spasms of lower extremities related to her sciatic nerve irritation.  She has been treated in the ED with medication ministration, and reports improvement of her symptoms.  She will be discharged with prescriptions for Flexeril and a small dose of hydrocodone.  She is again referred to her PCP for further management.  Return precautions have been discussed.    Michele [name] was evaluated in Emergency Department on 07/04/2020 for the symptoms described in the history of  present illness. She was evaluated in the context of the global COVID-19 pandemic, which necessitated consideration that the patient might be at risk for infection with the SARS-CoV-2 virus that causes COVID-19. Institutional protocols and algorithms that pertain to the evaluation of patients at risk for COVID-19 are in a state of rapid change based on information released by regulatory bodies including the CDC and federal and state organizations. These policies and algorithms were followed during the patient's care in the ED.  I reviewed the patient's prescription history over the last 12 months in the multi-state controlled substances database(s) that includes [place], [place], [place], [place], [place], [place], [place], [place], [place], [place], [place], [place], [place], and [place].  Results were notable for no current RX. ____________________________________________  FINAL CLINICAL IMPRESSION(S) / ED DIAGNOSES  Final diagnoses:   Sciatic leg pain      Pearson Picou, Charlesetta Ivory, PA-C 07/04/20 2339    Gilles Chiquito, MD 07/05/20 [phone]

## 2020-07-04 NOTE — ED Triage Notes (Signed)
Pt reports pain and spasms that start in her buttocks that radiates into bilateral legs. Pt also having the same that is effecting underneath the right scapula area. Pt reports increased pain in the last day and with any movement. Pt has had injections for symptoms in past.

## 2020-07-04 NOTE — Discharge Instructions (Addendum)
Your exam is overall normal at this time.  You have been treated for sciatica flare with injection and oral medications.  You should follow-up with your primary provider or Ortho spine for ongoing management.  Return to the ED if needed.

## 2020-08-30 ENCOUNTER — Emergency Department
Admission: EM | Admit: 2020-08-30 | Discharge: 2020-08-30 | Disposition: A | Discharge status: Home / Self Care | Payer: Self-pay | Attending: Emergency Medicine | Admitting: Emergency Medicine

## 2020-08-30 ENCOUNTER — Emergency Department: Payer: Self-pay

## 2020-08-30 ENCOUNTER — Other Ambulatory Visit: Payer: Self-pay

## 2020-08-30 DIAGNOSIS — E119 Type 2 diabetes mellitus without complications: Secondary | ICD-10-CM | POA: Insufficient documentation

## 2020-08-30 DIAGNOSIS — I10 Essential (primary) hypertension: Secondary | ICD-10-CM | POA: Insufficient documentation

## 2020-08-30 DIAGNOSIS — Z7984 Long term (current) use of oral hypoglycemic drugs: Secondary | ICD-10-CM | POA: Insufficient documentation

## 2020-08-30 DIAGNOSIS — R110 Nausea: Secondary | ICD-10-CM

## 2020-08-30 DIAGNOSIS — Z794 Long term (current) use of insulin: Secondary | ICD-10-CM | POA: Insufficient documentation

## 2020-08-30 DIAGNOSIS — U071 COVID-19: Secondary | ICD-10-CM | POA: Insufficient documentation

## 2020-08-30 DIAGNOSIS — Z79899 Other long term (current) drug therapy: Secondary | ICD-10-CM | POA: Insufficient documentation

## 2020-08-30 DIAGNOSIS — F17220 Nicotine dependence, chewing tobacco, uncomplicated: Secondary | ICD-10-CM | POA: Insufficient documentation

## 2020-08-30 LAB — CBC WITH DIFFERENTIAL/PLATELET
Abs Immature Granulocytes: 0.02 10*3/uL (ref 0.00–0.07)
Basophils Absolute: 0 10*3/uL (ref 0.0–0.1)
Basophils Relative: 0 %
Eosinophils Absolute: 0.1 10*3/uL (ref 0.0–0.5)
Eosinophils Relative: 1 %
HCT: 37.9 % (ref 36.0–46.0)
Hemoglobin: 12.5 g/dL (ref 12.0–15.0)
Immature Granulocytes: 0 %
Lymphocytes Relative: 12 %
Lymphs Abs: 1 10*3/uL (ref 0.7–4.0)
MCH: 26.3 pg (ref 26.0–34.0)
MCHC: 33 g/dL (ref 30.0–36.0)
MCV: 79.8 fL — ABNORMAL LOW (ref 80.0–100.0)
Monocytes Absolute: 0.6 10*3/uL (ref 0.1–1.0)
Monocytes Relative: 7 %
Neutro Abs: 6.7 10*3/uL (ref 1.7–7.7)
Neutrophils Relative %: 80 %
Platelets: 223 10*3/uL (ref 150–400)
RBC: 4.75 MIL/uL (ref 3.87–5.11)
RDW: 13.4 % (ref 11.5–15.5)
WBC: 8.4 10*3/uL (ref 4.0–10.5)
nRBC: 0 % (ref 0.0–0.2)

## 2020-08-30 LAB — URINALYSIS, COMPLETE (UACMP) WITH MICROSCOPIC
Bilirubin Urine: NEGATIVE
Glucose, UA: NEGATIVE mg/dL
Hgb urine dipstick: NEGATIVE
Ketones, ur: NEGATIVE mg/dL
Leukocytes,Ua: NEGATIVE
Nitrite: NEGATIVE
Protein, ur: NEGATIVE mg/dL
Specific Gravity, Urine: 1.013 (ref 1.005–1.030)
pH: 7 (ref 5.0–8.0)

## 2020-08-30 LAB — COMPREHENSIVE METABOLIC PANEL
ALT: 25 U/L (ref 0–44)
AST: 16 U/L (ref 15–41)
Albumin: 3.5 g/dL (ref 3.5–5.0)
Alkaline Phosphatase: 82 U/L (ref 38–126)
Anion gap: 6 (ref 5–15)
BUN: 13 mg/dL (ref 6–20)
CO2: 26 mmol/L (ref 22–32)
Calcium: 8.7 mg/dL — ABNORMAL LOW (ref 8.9–10.3)
Chloride: 107 mmol/L (ref 98–111)
Creatinine, Ser: 0.95 mg/dL (ref 0.44–1.00)
GFR, Estimated: >60 mL/min (ref >60)
Glucose, Bld: 138 mg/dL — ABNORMAL HIGH (ref 70–99)
Potassium: 3.4 mmol/L — ABNORMAL LOW (ref 3.5–5.1)
Sodium: 139 mmol/L (ref 135–145)
Total Bilirubin: 0.6 mg/dL (ref 0.3–1.2)
Total Protein: 6.3 g/dL — ABNORMAL LOW (ref 6.5–8.1)

## 2020-08-30 LAB — RESP PANEL BY RT-PCR (FLU A&B, COVID) ARPGX2
Influenza A by PCR: NEGATIVE
Influenza B by PCR: NEGATIVE
SARS Coronavirus 2 by RT PCR: POSITIVE — AB

## 2020-08-30 LAB — CBG MONITORING, ED: Glucose-Capillary: 151 mg/dL — ABNORMAL HIGH (ref 70–99)

## 2020-08-30 MED ORDER — LIDOCAINE 5 % EX PTCH
1.0000 | MEDICATED_PATCH | CUTANEOUS | Status: DC
  Administered 2020-08-30: 1 via TRANSDERMAL
  Filled 2020-08-30: qty 1

## 2020-08-30 MED ORDER — ACETAMINOPHEN 500 MG PO TABS
1000.0000 mg | ORAL_TABLET | Freq: Once | ORAL | Status: AC
  Administered 2020-08-30: 1000 mg via ORAL
  Filled 2020-08-30: qty 2

## 2020-08-30 MED ORDER — NIRMATRELVIR/RITONAVIR (PAXLOVID)TABLET: 3.0000 | ORAL_TABLET | Freq: Two times a day (BID) | ORAL | 0 refills | Status: AC

## 2020-08-30 MED ORDER — KETOROLAC TROMETHAMINE 30 MG/ML IJ SOLN
30.0000 mg | Freq: Once | INTRAMUSCULAR | Status: AC
  Administered 2020-08-30: 30 mg via INTRAMUSCULAR
  Filled 2020-08-30: qty 1

## 2020-08-30 MED ORDER — BENZONATATE 100 MG PO CAPS
200.0000 mg | ORAL_CAPSULE | Freq: Once | ORAL | Status: AC
  Administered 2020-08-30: 200 mg via ORAL
  Filled 2020-08-30: qty 2

## 2020-08-30 MED ORDER — BENZONATATE 100 MG PO CAPS: 100.0000 mg | ORAL_CAPSULE | Freq: Three times a day (TID) | ORAL | 0 refills | Status: DC | PRN

## 2020-08-30 MED ORDER — ONDANSETRON 4 MG PO TBDP: 4.0000 mg | ORAL_TABLET | Freq: Three times a day (TID) | ORAL | 0 refills | Status: DC | PRN

## 2020-08-30 MED ORDER — GUAIFENESIN-CODEINE 100-10 MG/5ML PO SYRP: 5.0000 mL | ORAL_SOLUTION | Freq: Three times a day (TID) | ORAL | 0 refills | Status: DC | PRN

## 2020-08-30 NOTE — ED Notes (Signed)
See triage note  Presents with body aches low grade temp and cough  States sx's started about 3 days ago

## 2020-08-30 NOTE — Discharge Instructions (Addendum)
You can take Tylenol 1 g every 8 hours and ibuprofen 400 every 6 hours for 1 week to help with fevers.   Hold your atorvastatin until you have completed your covid medication palovid for five days.   Take your blood pressure every morning and if your blood pressure is dropping low or you feel lightheaded take half of your medication, verapamil  Take Zofran to help with nausea, Tessalon Perles to help with cough, guaifenesin codeine to help with cough.  This does have an opiate and it do not drive or work while on this.  Do not take with any other sedating medicines like other opioids or benzodiazepine like diazepam, Valium  Take oxycodone as prescribed. Do not drink alcohol, drive or participate in any other potentially dangerous activities while taking this medication as it may make you sleepy. Do not take this medication with any other sedating medications, either prescription or over-the-counter. If you were prescribed Percocet or Vicodin, do not take these with acetaminophen (Tylenol) as it is already contained within these medications.  This medication is an opiate (or narcotic) pain medication and can be habit forming. Use it as little as possible to achieve adequate pain control. Do not use or use it with extreme caution if you have a history of opiate abuse or dependence. If you are on a pain contract with your primary care doctor or a pain specialist, be sure to let them know you were prescribed this medication today from the Emergency Department. This medication is intended for your use only - do not give any to anyone else and keep it in a secure place where nobody else, especially children, have access to it.

## 2020-08-30 NOTE — ED Triage Notes (Signed)
Pt to ED for bodyaches, chills, cough for 3 days.  States around someone with covid on Saturday Pt in NAD, RR Even and unlabored, speaking in complete sentences

## 2020-08-30 NOTE — ED Provider Notes (Signed)
[facility] Emergency Department Provider Note  ____________________________________________   Event Date/Time   First MD Initiated Contact with Patient 08/30/20 1437     (approximate)  I have reviewed the triage vital signs and the nursing notes.   HISTORY  Chief Complaint COVID sx    HPI [name] is a 55 y.o. female with diabetes, hypertension who comes in with concern for body aches, low-grade temperature.  Patient reports her symptoms started 3 days ago.  She states that she feels really tender all over her entire body.  She has known history of sciatica but feels like her pain everywhere is flaring up, constant, nothing making it better, worse with certain movements.  Patient states that she was around someone with COVID 4 days ago.  Denies any shortness of breath.  Does have some coughing.  Patient still able to tolerate eating and drinking. denies any abdominal pain Does have covid vaccines           Past Medical History:  Diagnosis Date   Diabetes mellitus without complication (HCC)    Hypertension    Migraines     Patient Active Problem List   Diagnosis Date Noted   Lumbar facet joint syndrome 03/21/2018   Chronic bilateral low back pain without sciatica 03/21/2018   Chronic pain syndrome 03/21/2018   Obesity, morbid, BMI 40.0-49.9 (HCC) 07/12/2017   Bilateral occipital neuralgia 09/14/2016   Neck pain 12/16/2014   Migraines 04/28/2014   Diabetes mellitus type 2, uncomplicated (HCC) 04/28/2014    Past Surgical History:  Procedure Laterality Date   ABDOMINAL HYSTERECTOMY     ANKLE SURGERY     CESAREAN SECTION     KNEE SURGERY     WRIST SURGERY      Prior to Admission medications   Medication Sig Start Date End Date Taking? Authorizing Provider  amitriptyline (ELAVIL) 50 MG tablet Take 50 mg by mouth at bedtime. 12/09/18   [provider]  atorvastatin (LIPITOR) 20 MG tablet Take 20 mg by mouth daily at 6 PM.     [provider]  cyclobenzaprine (FLEXERIL) 5 MG tablet Take 1 tablet (5 mg total) by mouth 3 (three) times daily as needed for muscle spasms. 07/04/20   Menshew, Charlesetta IvoryJenise V Bacon, PA-C  glipiZIDE (GLUCOTROL) 5 MG tablet Take 5 mg by mouth daily before breakfast.    [provider]  HYDROcodone-acetaminophen (NORCO) 5-325 MG tablet Take 1 tablet by mouth 3 (three) times daily as needed. 07/04/20   Menshew, Charlesetta IvoryJenise V Bacon, PA-C  insulin glargine (LANTUS) 100 UNIT/ML injection Inject 18 Units into the skin at bedtime.     [provider]  metFORMIN (GLUCOPHAGE) 1000 MG tablet Take 1 tablet (1,000 mg total) by mouth 2 (two) times daily with a meal. Patient taking differently: Take 500 mg by mouth daily.  03/29/17 05/19/19  Emily FilbertWilliams, Jonathan E, MD  ondansetron (ZOFRAN ODT) 4 MG disintegrating tablet Take 1 tablet (4 mg total) by mouth every 6 (six) hours as needed for nausea or vomiting. 05/01/20   Ward, Layla MawKristen N, DO  pantoprazole (PROTONIX) 20 MG tablet Take 1 tablet (20 mg total) by mouth daily. 05/13/19 05/12/20  Jene EveryKinner, Robert, MD  sucralfate (CARAFATE) 1 g tablet Take 1 tablet (1 g total) by mouth 4 (four) times daily for 15 days. 05/13/19 05/28/19  Jene EveryKinner, Robert, MD  SUMAtriptan-naproxen (TREXIMET) 85-500 MG per tablet Take 1 tablet by mouth every 2 (two) hours as needed for migraine.  [provider]  topiramate (TOPAMAX) 100 MG tablet Take 100 mg by mouth 2 (two) times daily.    [provider]  triamterene-hydrochlorothiazide (MAXZIDE-25) 37.5-25 MG per tablet Take 1 tablet by mouth daily.    [provider]  verapamil (COVERA HS) 180 MG (CO) 24 hr tablet Take 180 mg by mouth at bedtime.    [provider]    Allergies Patient has no known allergies.  Family History  Problem Relation Age of Onset   Breast cancer Neg Hx     Social History Social History   Tobacco Use   Smoking status: Never   Smokeless tobacco: Current    Types:  Snuff  Vaping Use   Vaping Use: Never used  Substance Use Topics   Alcohol use: Yes    Comment: occasionally   Drug use: No      Review of Systems Constitutional:+ fever, body aches  Eyes: No visual changes. ENT: No sore throat. Cardiovascular: Denies chest pain. Respiratory: Denies shortness of breath. cough Gastrointestinal: No abdominal pain.  No nausea, no vomiting.  No diarrhea.  No constipation. Genitourinary: Negative for dysuria. Musculoskeletal: Negative for back pain. Skin: Negative for rash. Neurological: Negative for headaches, focal weakness or numbness. All other ROS negative ____________________________________________   PHYSICAL EXAM:  VITAL SIGNS: ED Triage Vitals  Enc Vitals Group     BP 08/30/20 1333 (!) 155/111     Pulse Rate 08/30/20 1333 88     Resp 08/30/20 1333 20     Temp 08/30/20 1333 100.1 F (37.8 C)     Temp Source 08/30/20 1333 Oral     SpO2 08/30/20 1333 100 %     Weight 08/30/20 1332 244 lb (110.7 kg)     Height 08/30/20 1332 5\' 6"  (1.676 m)     Head Circumference --      Peak Flow --      Pain Score 08/30/20 1332 10     Pain Loc --      Pain Edu? --      Excl. in GC? --     Constitutional: Alert and oriented. Well appearing and in no acute distress. Eyes: Conjunctivae are normal. EOMI. Head: Atraumatic. Nose: No congestion/rhinnorhea. Mouth/Throat: Mucous membranes are moist.   Neck: No stridor. Trachea Midline. FROM Cardiovascular: Normal rate, regular rhythm. Grossly normal heart sounds.  Good peripheral circulation. Respiratory: Normal respiratory effort.  No retractions. Lungs CTAB.  Occasional cough Gastrointestinal: Soft and nontender. No distention. No abdominal bruits.  Musculoskeletal: No lower extremity tenderness nor edema.  No joint effusions. Neurologic:  Normal speech and language. No gross focal neurologic deficits are appreciated.  Skin:  Skin is warm, dry and intact. No rash noted.  Patient is tender over her  extremities with mild palpation.  No rash or erythema noted Psychiatric: Mood and affect are normal. Speech and behavior are normal. GU: Deferred   ____________________________________________   LABS (all labs ordered are listed, but only abnormal results are displayed)  Labs Reviewed  RESP PANEL BY RT-PCR (FLU A&B, COVID) ARPGX2 - Abnormal; Notable for the following components:      Result Value   SARS Coronavirus 2 by RT PCR POSITIVE (*)    All other components within normal limits  URINALYSIS, COMPLETE (UACMP) WITH MICROSCOPIC - Abnormal; Notable for the following components:   Color, Urine STRAW (*)    APPearance CLEAR (*)    Bacteria, UA RARE (*)    All other components within normal limits  CBC WITH DIFFERENTIAL/PLATELET - Abnormal; Notable for the following components:   MCV 79.8 (*)    All other components within normal limits  COMPREHENSIVE METABOLIC PANEL - Abnormal; Notable for the following components:   Potassium 3.4 (*)    Glucose, Bld 138 (*)    Calcium 8.7 (*)    Total Protein 6.3 (*)    All other components within normal limits  CBG MONITORING, ED - Abnormal; Notable for the following components:   Glucose-Capillary 151 (*)    All other components within normal limits   ____________________________________________   RADIOLOGY I, Concha Se, personally viewed and evaluated these images (plain radiographs) as part of my medical decision making, as well as reviewing the written report by the radiologist.  ED MD interpretation:   no PNA  Official radiology report(s): DG Chest Portable 1 View  Result Date: 08/30/2020 CLINICAL DATA:  Fever and cough EXAM: PORTABLE CHEST 1 VIEW COMPARISON:  05/13/2018 FINDINGS: The heart size and mediastinal contours are within normal limits. Both lungs are clear. The visualized skeletal structures are unremarkable. IMPRESSION: No active disease. Electronically Signed   By: Alcide Clever M.D.   On: 08/30/2020 15:15     ____________________________________________   PROCEDURES  Procedure(s) performed (including Critical Care):  Procedures   ____________________________________________   INITIAL IMPRESSION / ASSESSMENT AND PLAN / ED COURSE  Sheenah Dimitroff was evaluated in Emergency Department on 08/30/2020 for the symptoms described in the history of present illness. She was evaluated in the context of the global COVID-19 pandemic, which necessitated consideration that the patient might be at risk for infection with the SARS-CoV-2 virus that causes COVID-19. Institutional protocols and algorithms that pertain to the evaluation of patients at risk for COVID-19 are in a state of rapid change based on information released by regulatory bodies including the CDC and federal and state organizations. These policies and algorithms were followed during the patient's care in the ED.    Patient comes in with fever, muscle aches, cough.  Suspect that this is most likely COVID, flu.  Will get chest x-ray to make sure no evidence of pneumonia and urine to make sure no evidence of UTI.  Patient does report being able to tolerate p.o. and looks well-hydrated upon examination with normal heart rate.  I do not feel patient needs labs at this time.  Will treat symptomatically.  Chest x-ray is negative for COVID-pneumonia and oxygen levels are 100%.  COVID test is positive.  Updated her on results and she states that she is feeling very nauseous.  She states that she has not been able to eat and drink as well at home.  I did get basic labs just to make sure no evidence of electrolyte abnormalities, AKI given the concern that we could be started patient on Paxilovid.  These labs are reassuring.  Patient has been tolerating drinking at bedside. D/w pharmacist Ardell Isaacs to review drug interactions.  Patient last took atorvastatin yesterday night we cleared her to hold it for the past next 5 days.  We also discussed with her about  her verapamil.  She is hypertensive here but I explained to her that it can cause increasing efficacy of the medication and that she can take her blood pressures daily and if they are running high she could take half of the pill instead of the full pill.  Patient expressed understanding.  Also will prescribe her cough medicine and as well as cough syrup with codeine and it to  be used at nighttime.  Understands not to drive while on this.   I discussed the provisional nature of ED diagnosis, the treatment so far, the ongoing plan of care, follow up appointments and return precautions with the patient and any family or support people present. They expressed understanding and agreed with the plan, discharged home.      ____________________________________________   FINAL CLINICAL IMPRESSION(S) / ED DIAGNOSES   Final diagnoses:  COVID-19  Nausea      MEDICATIONS GIVEN DURING THIS VISIT:  Medications  acetaminophen (TYLENOL) tablet 1,000 mg (has no administration in time range)  ketorolac (TORADOL) 30 MG/ML injection 30 mg (has no administration in time range)  lidocaine (LIDODERM) 5 % 1 patch (has no administration in time range)  benzonatate (TESSALON) capsule 200 mg (has no administration in time range)     ED Discharge Orders          Ordered    ondansetron (ZOFRAN ODT) 4 MG disintegrating tablet  Every 8 hours PRN        08/30/20 1816    nirmatrelvir/ritonavir EUA (PAXLOVID) TABS  2 times daily        08/30/20 1816    benzonatate (TESSALON PERLES) 100 MG capsule  3 times daily PRN        08/30/20 1816    guaiFENesin-codeine (ROBITUSSIN AC) 100-10 MG/5ML syrup  3 times daily PRN        08/30/20 1816             Note:  This document was prepared using Dragon voice recognition software and may include unintentional dictation errors.    Concha Se, MD 08/30/20 [phone]

## 2020-09-22 ENCOUNTER — Other Ambulatory Visit: Payer: Self-pay

## 2020-09-22 ENCOUNTER — Emergency Department
Admission: EM | Admit: 2020-09-22 | Discharge: 2020-09-22 | Disposition: A | Discharge status: Home / Self Care | Payer: Self-pay | Attending: Emergency Medicine | Admitting: Emergency Medicine

## 2020-09-22 ENCOUNTER — Encounter: Payer: Self-pay | Admitting: Emergency Medicine

## 2020-09-22 DIAGNOSIS — Z794 Long term (current) use of insulin: Secondary | ICD-10-CM | POA: Insufficient documentation

## 2020-09-22 DIAGNOSIS — M5432 Sciatica, left side: Secondary | ICD-10-CM | POA: Insufficient documentation

## 2020-09-22 DIAGNOSIS — Z79899 Other long term (current) drug therapy: Secondary | ICD-10-CM | POA: Insufficient documentation

## 2020-09-22 DIAGNOSIS — F17200 Nicotine dependence, unspecified, uncomplicated: Secondary | ICD-10-CM | POA: Insufficient documentation

## 2020-09-22 DIAGNOSIS — E119 Type 2 diabetes mellitus without complications: Secondary | ICD-10-CM | POA: Insufficient documentation

## 2020-09-22 DIAGNOSIS — I10 Essential (primary) hypertension: Secondary | ICD-10-CM | POA: Insufficient documentation

## 2020-09-22 DIAGNOSIS — Z7984 Long term (current) use of oral hypoglycemic drugs: Secondary | ICD-10-CM | POA: Insufficient documentation

## 2020-09-22 MED ORDER — HYDROCODONE-ACETAMINOPHEN 5-325 MG PO TABS
1.0000 | ORAL_TABLET | Freq: Once | ORAL | Status: AC
  Administered 2020-09-22: 1 via ORAL
  Filled 2020-09-22: qty 1

## 2020-09-22 MED ORDER — METHOCARBAMOL 500 MG PO TABS: 500.0000 mg | ORAL_TABLET | Freq: Four times a day (QID) | ORAL | 1 refills | Status: DC

## 2020-09-22 MED ORDER — HYDROCODONE-ACETAMINOPHEN 5-325 MG PO TABS: 1.0000 | ORAL_TABLET | ORAL | 0 refills | Status: DC | PRN

## 2020-09-22 MED ORDER — PREDNISONE 50 MG PO TABS: 50.0000 mg | ORAL_TABLET | Freq: Every day | ORAL | 0 refills | Status: DC

## 2020-09-22 MED ORDER — DEXAMETHASONE SODIUM PHOSPHATE 10 MG/ML IJ SOLN
10.0000 mg | Freq: Once | INTRAMUSCULAR | Status: AC
  Administered 2020-09-22: 10 mg via INTRAMUSCULAR
  Filled 2020-09-22: qty 1

## 2020-09-22 MED ORDER — ORPHENADRINE CITRATE 30 MG/ML IJ SOLN
60.0000 mg | Freq: Once | INTRAMUSCULAR | Status: AC
  Administered 2020-09-22: 60 mg via INTRAMUSCULAR
  Filled 2020-09-22: qty 2

## 2020-09-22 NOTE — ED Notes (Signed)
See triage note. Pt states sciatica is a chronic condition but "comes and goes".

## 2020-09-22 NOTE — ED Provider Notes (Signed)
[facility] Emergency Department Provider Note  ____________________________________________  Time seen: Approximately 7:32 PM  I have reviewed the triage vital signs and the nursing notes.   HISTORY  Chief Complaint Sciatica    HPI Michele [name] is a 55 y.o. female who presents the emergency department complaining of left-sided sciatica.  Patient has a significant history of sciatica, states that she will have flares every so often and started to experience the pain down the left side.  States that sometimes that it is unilateral, sometimes bilateral.  Most the time it involves her [name] pain with pain radiating down the leg.  Right now there is only pain radiating down the leg.  Patient took her last muscle relaxer that she had from a previous encounter and states that this helped but did not fully alleviate her symptoms.  She states that she typically requires muscle relaxer and steroid to calm her sciatica down.  No urinary complaints.  No GI complaints.  Pain is consistent with her previous sciatica.  No loss of sensation.  No bowel or bladder dysfunction, saddle anesthesia or paresthesias.       Past Medical History:  Diagnosis Date   Diabetes mellitus without complication (HCC)    Hypertension    Migraines     Patient Active Problem List   Diagnosis Date Noted   Lumbar facet joint syndrome 03/21/2018   Chronic bilateral low [name] pain without sciatica 03/21/2018   Chronic pain syndrome 03/21/2018   Obesity, morbid, BMI 40.0-49.9 (HCC) 07/12/2017   Bilateral occipital neuralgia 09/14/2016   Neck pain 12/16/2014   Migraines 04/28/2014   Diabetes mellitus type 2, uncomplicated (HCC) 04/28/2014    Past Surgical History:  Procedure Laterality Date   ABDOMINAL HYSTERECTOMY     ANKLE SURGERY     CESAREAN SECTION     KNEE SURGERY     WRIST SURGERY      Prior to Admission medications   Medication Sig Start Date End Date Taking? Authorizing  Provider  HYDROcodone-acetaminophen (NORCO/VICODIN) 5-325 MG tablet Take 1 tablet by mouth every 4 (four) hours as needed for moderate pain. 09/22/20 09/22/21 Yes Dashaun Onstott, Delorise Royals, PA-C  methocarbamol (ROBAXIN) 500 MG tablet Take 1 tablet (500 mg total) by mouth 4 (four) times daily. 09/22/20  Yes Laron Boorman, Delorise Royals, PA-C  predniSONE (DELTASONE) 50 MG tablet Take 1 tablet (50 mg total) by mouth daily with breakfast. 09/22/20  Yes Hali Balgobin, Delorise Royals, PA-C  amitriptyline (ELAVIL) 50 MG tablet Take 50 mg by mouth at bedtime. 12/09/18   [provider]  atorvastatin (LIPITOR) 20 MG tablet Take 20 mg by mouth daily at 6 PM.    [provider]  benzonatate (TESSALON PERLES) 100 MG capsule Take 1 capsule (100 mg total) by mouth 3 (three) times daily as needed for cough. 08/30/20 08/30/21  Concha Se, MD  cyclobenzaprine (FLEXERIL) 5 MG tablet Take 1 tablet (5 mg total) by mouth 3 (three) times daily as needed for muscle spasms. 07/04/20   Menshew, Charlesetta Ivory, PA-C  glipiZIDE (GLUCOTROL) 5 MG tablet Take 5 mg by mouth daily before breakfast.    [provider]  guaiFENesin-codeine (ROBITUSSIN AC) 100-10 MG/5ML syrup Take 5 mLs by mouth 3 (three) times daily as needed for cough. 08/30/20   Concha Se, MD  insulin glargine (LANTUS) 100 UNIT/ML injection Inject 18 Units into the skin at bedtime.     [provider]  metFORMIN (GLUCOPHAGE) 1000 MG tablet Take 1  tablet (1,000 mg total) by mouth 2 (two) times daily with a meal. Patient taking differently: Take 500 mg by mouth daily.  03/29/17 05/19/19  Emily Filbert, MD  ondansetron (ZOFRAN ODT) 4 MG disintegrating tablet Take 1 tablet (4 mg total) by mouth every 8 (eight) hours as needed for nausea or vomiting. 08/30/20   Concha Se, MD  pantoprazole (PROTONIX) 20 MG tablet Take 1 tablet (20 mg total) by mouth daily. 05/13/19 05/12/20  Jene Every, MD  sucralfate (CARAFATE) 1 g tablet Take 1 tablet (1 g total)  by mouth 4 (four) times daily for 15 days. 05/13/19 05/28/19  Jene Every, MD  SUMAtriptan-naproxen (TREXIMET) 85-500 MG per tablet Take 1 tablet by mouth every 2 (two) hours as needed for migraine.    [provider]  topiramate (TOPAMAX) 100 MG tablet Take 100 mg by mouth 2 (two) times daily.    [provider]  triamterene-hydrochlorothiazide (MAXZIDE-25) 37.5-25 MG per tablet Take 1 tablet by mouth daily.    [provider]  verapamil (COVERA HS) 180 MG (CO) 24 hr tablet Take 180 mg by mouth at bedtime.    [provider]    Allergies Patient has no known allergies.  Family History  Problem Relation Age of Onset   Breast cancer Neg Hx     Social History Social History   Tobacco Use   Smoking status: Never   Smokeless tobacco: Current    Types: Snuff  Vaping Use   Vaping Use: Never used  Substance Use Topics   Alcohol use: Yes    Comment: Occasional   Drug use: No     Review of Systems  Constitutional: No fever/chills Eyes: No visual changes. No discharge ENT: No upper respiratory complaints. Cardiovascular: no chest pain. Respiratory: no cough. No SOB. Gastrointestinal: No abdominal pain.  No nausea, no vomiting.  No diarrhea.  No constipation. Genitourinary: Negative for dysuria. No hematuria Musculoskeletal: Pain radiating down the left leg Skin: Negative for rash, abrasions, lacerations, ecchymosis. Neurological: Negative for headaches, focal weakness or numbness.  10 System ROS otherwise negative.  ____________________________________________   PHYSICAL EXAM:  VITAL SIGNS: ED Triage Vitals  Enc Vitals Group     BP 09/22/20 1808 (!) 171/99     Pulse Rate 09/22/20 1808 80     Resp 09/22/20 1808 16     Temp 09/22/20 1808 98.9 F (37.2 C)     Temp Source 09/22/20 1808 Oral     SpO2 09/22/20 1808 95 %     Weight 09/22/20 1809 245 lb (111.1 kg)     Height 09/22/20 1809 5\' 6"  (1.676 m)     Head Circumference --       Peak Flow --      Pain Score 09/22/20 1809 10     Pain Loc --      Pain Edu? --      Excl. in GC? --      Constitutional: Alert and oriented. Well appearing and in no acute distress. Eyes: Conjunctivae are normal. PERRL. EOMI. Head: Atraumatic. ENT:      Ears:       Nose: No congestion/rhinnorhea.      Mouth/Throat: Mucous membranes are moist.  Neck: No stridor.    Cardiovascular: Normal rate, regular rhythm. Normal S1 and S2.  Good peripheral circulation. Respiratory: Normal respiratory effort without tachypnea or retractions. Lungs CTAB. Good air entry to the bases with no decreased or absent breath sounds. Gastrointestinal: Bowel sounds 4  quadrants. Soft and nontender to palpation. No guarding or rigidity. No palpable masses. No distention. No CVA tenderness. Musculoskeletal: Full range of motion to all extremities. No gross deformities appreciated.  No tenderness of palpation of the lumbar spine.  Tenderness extending from the sciatic notch down the left leg.  No palpable abnormality.  Full range of motion to both lower extremities.  Sensation, dorsalis pedis pulses, capillary refill intact and equal bilateral lower extremities. Neurologic:  Normal speech and language. No gross focal neurologic deficits are appreciated.  Skin:  Skin is warm, dry and intact. No rash noted. Psychiatric: Mood and affect are normal. Speech and behavior are normal. Patient exhibits appropriate insight and judgement.   ____________________________________________   LABS (all labs ordered are listed, but only abnormal results are displayed)  Labs Reviewed - No data to display ____________________________________________  EKG   ____________________________________________  RADIOLOGY   No results found.  ____________________________________________    PROCEDURES  Procedure(s) performed:    Procedures    Medications  HYDROcodone-acetaminophen (NORCO/VICODIN) 5-325 MG per tablet 1  tablet (has no administration in time range)  dexamethasone (DECADRON) injection 10 mg (has no administration in time range)  orphenadrine (NORFLEX) injection 60 mg (has no administration in time range)     ____________________________________________   INITIAL IMPRESSION / ASSESSMENT AND PLAN / ED COURSE  Pertinent labs & imaging results that were available during my care of the patient were reviewed by me and considered in my medical decision making (see chart for details).  Review of the [place] CSRS was performed in accordance of the [facility] prior to dispensing any controlled drugs.           Patient's diagnosis is consistent with sciatica.  Patient presented to the emergency department complaining of pain radiating down the left leg.  Patient has a significant history of sciatica with a history of degenerative disc disease.  No recent trauma.  No urinary or GI complaints.  Findings were consistent with sciatica.  Patient will be placed on steroid, muscle relaxer and very limited pain medication.  Follow-up with primary care as needed.  Return precautions discussed with the patient.  No indication for labs or imaging currently. Patient is given ED precautions to return to the ED for any worsening or new symptoms.     ____________________________________________  FINAL CLINICAL IMPRESSION(S) / ED DIAGNOSES  Final diagnoses:  Sciatica of left side      NEW MEDICATIONS STARTED DURING THIS VISIT:  ED Discharge Orders          Ordered    predniSONE (DELTASONE) 50 MG tablet  Daily with breakfast        09/22/20 1932    methocarbamol (ROBAXIN) 500 MG tablet  4 times daily        09/22/20 1932    HYDROcodone-acetaminophen (NORCO/VICODIN) 5-325 MG tablet  Every 4 hours PRN        09/22/20 1932                This chart was dictated using voice recognition software/Dragon. Despite best efforts to proofread, errors can occur which can change the meaning. Any change was  purely unintentional.    Lanette Hampshire 09/22/20 1935    Concha Se, MD 09/22/20 2013

## 2020-09-22 NOTE — ED Triage Notes (Signed)
Pt in via POV, reports sciatic pain to left side x 2 days.  Reports hx of same, currently out of muscle relaxer.  NAD noted at this time.

## 2020-10-25 ENCOUNTER — Emergency Department
Admission: EM | Admit: 2020-10-25 | Discharge: 2020-10-25 | Disposition: A | Discharge status: Home / Self Care | Payer: Self-pay | Attending: Emergency Medicine | Admitting: Emergency Medicine

## 2020-10-25 ENCOUNTER — Encounter: Payer: Self-pay | Admitting: Emergency Medicine

## 2020-10-25 ENCOUNTER — Other Ambulatory Visit: Payer: Self-pay

## 2020-10-25 DIAGNOSIS — R739 Hyperglycemia, unspecified: Secondary | ICD-10-CM

## 2020-10-25 DIAGNOSIS — E1165 Type 2 diabetes mellitus with hyperglycemia: Secondary | ICD-10-CM | POA: Insufficient documentation

## 2020-10-25 DIAGNOSIS — Z79899 Other long term (current) drug therapy: Secondary | ICD-10-CM | POA: Insufficient documentation

## 2020-10-25 DIAGNOSIS — R202 Paresthesia of skin: Secondary | ICD-10-CM | POA: Insufficient documentation

## 2020-10-25 DIAGNOSIS — I10 Essential (primary) hypertension: Secondary | ICD-10-CM | POA: Insufficient documentation

## 2020-10-25 DIAGNOSIS — Z794 Long term (current) use of insulin: Secondary | ICD-10-CM | POA: Insufficient documentation

## 2020-10-25 DIAGNOSIS — Z7984 Long term (current) use of oral hypoglycemic drugs: Secondary | ICD-10-CM | POA: Insufficient documentation

## 2020-10-25 DIAGNOSIS — F17220 Nicotine dependence, chewing tobacco, uncomplicated: Secondary | ICD-10-CM | POA: Insufficient documentation

## 2020-10-25 LAB — CBC WITH DIFFERENTIAL/PLATELET
Abs Immature Granulocytes: 0.02 10*3/uL (ref 0.00–0.07)
Basophils Absolute: 0.1 10*3/uL (ref 0.0–0.1)
Basophils Relative: 1 %
Eosinophils Absolute: 0.1 10*3/uL (ref 0.0–0.5)
Eosinophils Relative: 2 %
HCT: 41.2 % (ref 36.0–46.0)
Hemoglobin: 13.6 g/dL (ref 12.0–15.0)
Immature Granulocytes: 0 %
Lymphocytes Relative: 45 %
Lymphs Abs: 2.9 10*3/uL (ref 0.7–4.0)
MCH: 26.8 pg (ref 26.0–34.0)
MCHC: 33 g/dL (ref 30.0–36.0)
MCV: 81.3 fL (ref 80.0–100.0)
Monocytes Absolute: 0.6 10*3/uL (ref 0.1–1.0)
Monocytes Relative: 9 %
Neutro Abs: 2.7 10*3/uL (ref 1.7–7.7)
Neutrophils Relative %: 43 %
Platelets: 283 10*3/uL (ref 150–400)
RBC: 5.07 MIL/uL (ref 3.87–5.11)
RDW: 13.2 % (ref 11.5–15.5)
WBC: 6.4 10*3/uL (ref 4.0–10.5)
nRBC: 0 % (ref 0.0–0.2)

## 2020-10-25 LAB — BASIC METABOLIC PANEL
Anion gap: 13 (ref 5–15)
BUN: 21 mg/dL — ABNORMAL HIGH (ref 6–20)
CO2: 23 mmol/L (ref 22–32)
Calcium: 9 mg/dL (ref 8.9–10.3)
Chloride: 105 mmol/L (ref 98–111)
Creatinine, Ser: 1.23 mg/dL — ABNORMAL HIGH (ref 0.44–1.00)
GFR, Estimated: 53 mL/min — ABNORMAL LOW (ref >60)
Glucose, Bld: 267 mg/dL — ABNORMAL HIGH (ref 70–99)
Potassium: 3.5 mmol/L (ref 3.5–5.1)
Sodium: 141 mmol/L (ref 135–145)

## 2020-10-25 NOTE — ED Notes (Signed)
MD at the bedside  

## 2020-10-25 NOTE — ED Provider Notes (Signed)
[facility] Emergency Department Provider Note  ____________________________________________   Event Date/Time   First MD Initiated Contact with Patient 10/25/20 2117     (approximate)  I have reviewed the triage vital signs and the nursing notes.   HISTORY  Chief Complaint Numbness    HPI Michele [name] is a 55 y.o. female with diabetes who comes in with left toe numbness.  Patient reports having left toe numbness.  Patient reports on the outer side of her left toe she has some decreased sensation.  She still able to feel me touch but states that it feels tingling.  This is been constant, nothing makes it better or worse.  Denies any numbness up into her leg.  She states that she will occasionally has some sharp stabbing pains down her leg and does have a history of sciatica. No bowel or bladder dysfunction, no saddle anesthesia- no weakness in legs.  No other numbness.           Past Medical History:  Diagnosis Date   Diabetes mellitus without complication (HCC)    Hypertension    Migraines     Patient Active Problem List   Diagnosis Date Noted   Lumbar facet joint syndrome 03/21/2018   Chronic bilateral low back pain without sciatica 03/21/2018   Chronic pain syndrome 03/21/2018   Obesity, morbid, BMI 40.0-49.9 (HCC) 07/12/2017   Bilateral occipital neuralgia 09/14/2016   Neck pain 12/16/2014   Migraines 04/28/2014   Diabetes mellitus type 2, uncomplicated (HCC) 04/28/2014    Past Surgical History:  Procedure Laterality Date   ABDOMINAL HYSTERECTOMY     ANKLE SURGERY     CESAREAN SECTION     KNEE SURGERY     WRIST SURGERY      Prior to Admission medications   Medication Sig Start Date End Date Taking? Authorizing Provider  amitriptyline (ELAVIL) 50 MG tablet Take 50 mg by mouth at bedtime. 12/09/18   [provider]  atorvastatin (LIPITOR) 20 MG tablet Take 20 mg by mouth daily at 6 PM.    [provider]   benzonatate (TESSALON PERLES) 100 MG capsule Take 1 capsule (100 mg total) by mouth 3 (three) times daily as needed for cough. 08/30/20 08/30/21  Concha Se, MD  cyclobenzaprine (FLEXERIL) 5 MG tablet Take 1 tablet (5 mg total) by mouth 3 (three) times daily as needed for muscle spasms. 07/04/20   Menshew, Charlesetta Ivory, PA-C  glipiZIDE (GLUCOTROL) 5 MG tablet Take 5 mg by mouth daily before breakfast.    [provider]  guaiFENesin-codeine (ROBITUSSIN AC) 100-10 MG/5ML syrup Take 5 mLs by mouth 3 (three) times daily as needed for cough. 08/30/20   Concha Se, MD  HYDROcodone-acetaminophen (NORCO/VICODIN) 5-325 MG tablet Take 1 tablet by mouth every 4 (four) hours as needed for moderate pain. 09/22/20 09/22/21  Cuthriell, Delorise Royals, PA-C  insulin glargine (LANTUS) 100 UNIT/ML injection Inject 18 Units into the skin at bedtime.     [provider]  metFORMIN (GLUCOPHAGE) 1000 MG tablet Take 1 tablet (1,000 mg total) by mouth 2 (two) times daily with a meal. Patient taking differently: Take 500 mg by mouth daily.  03/29/17 05/19/19  Emily Filbert, MD  methocarbamol (ROBAXIN) 500 MG tablet Take 1 tablet (500 mg total) by mouth 4 (four) times daily. 09/22/20   Cuthriell, Delorise Royals, PA-C  ondansetron (ZOFRAN ODT) 4 MG disintegrating tablet Take 1 tablet (4 mg total) by mouth every 8 (  eight) hours as needed for nausea or vomiting. 08/30/20   Concha Se, MD  pantoprazole (PROTONIX) 20 MG tablet Take 1 tablet (20 mg total) by mouth daily. 05/13/19 05/12/20  Jene Every, MD  predniSONE (DELTASONE) 50 MG tablet Take 1 tablet (50 mg total) by mouth daily with breakfast. 09/22/20   Cuthriell, Delorise Royals, PA-C  sucralfate (CARAFATE) 1 g tablet Take 1 tablet (1 g total) by mouth 4 (four) times daily for 15 days. 05/13/19 05/28/19  Jene Every, MD  SUMAtriptan-naproxen (TREXIMET) 85-500 MG per tablet Take 1 tablet by mouth every 2 (two) hours as needed for migraine.    [provider]  topiramate (TOPAMAX) 100 MG tablet Take 100 mg by mouth 2 (two) times daily.    [provider]  triamterene-hydrochlorothiazide (MAXZIDE-25) 37.5-25 MG per tablet Take 1 tablet by mouth daily.    [provider]  verapamil (COVERA HS) 180 MG (CO) 24 hr tablet Take 180 mg by mouth at bedtime.    [provider]    Allergies Patient has no known allergies.  Family History  Problem Relation Age of Onset   Breast cancer Neg Hx     Social History Social History   Tobacco Use   Smoking status: Never   Smokeless tobacco: Current    Types: Snuff  Vaping Use   Vaping Use: Never used  Substance Use Topics   Alcohol use: Yes    Comment: Occasional   Drug use: No      Review of Systems Constitutional: No fever/chills Eyes: No visual changes. ENT: No sore throat. Cardiovascular: Denies chest pain. Respiratory: Denies shortness of breath. Gastrointestinal: No abdominal pain.  No nausea, no vomiting.  No diarrhea.  No constipation. Genitourinary: Negative for dysuria. Musculoskeletal: Negative for back pain. numbness Skin: Negative for rash. Neurological: Negative for headaches, focal weakness or numbness. All other ROS negative ____________________________________________   PHYSICAL EXAM:  VITAL SIGNS: ED Triage Vitals  Enc Vitals Group     BP 10/25/20 1538 (!) 139/113     Pulse Rate 10/25/20 1538 93     Resp 10/25/20 1538 (!) 22     Temp 10/25/20 1538 99.3 F (37.4 C)     Temp Source 10/25/20 1538 Oral     SpO2 10/25/20 1538 100 %     Weight 10/25/20 1539 240 lb (108.9 kg)     Height 10/25/20 1539 5\' 6"  (1.676 m)     Head Circumference --      Peak Flow --      Pain Score 10/25/20 1538 4     Pain Loc --      Pain Edu? --      Excl. in GC? --     Constitutional: Alert and oriented. Well appearing and in no acute distress. Eyes: Conjunctivae are normal. EOMI. Head: Atraumatic. Nose: No  congestion/rhinnorhea. Mouth/Throat: Mucous membranes are moist.   Neck: No stridor. Trachea Midline. FROM Cardiovascular: Normal rate, regular rhythm. Grossly normal heart sounds.  Good peripheral circulation. Respiratory: Normal respiratory effort.  No retractions. Lungs CTAB. Gastrointestinal: Soft and nontender. No distention. No abdominal bruits.  Musculoskeletal: No lower extremity tenderness nor edema.  No joint effusions. Reported sensation changes on lateral side of the left toe. No warmth, no tenderness, no redness, no discoloration. 2+ distal pulse no calf tenderness. Able to range toe. Neurologic:  Normal speech and language. No gross focal neurologic deficits are appreciated. Cn 2-12 intact equal strength in arms and legs  Skin:  Skin is warm, dry and intact. No rash noted. Psychiatric: Mood and affect are normal. Speech and behavior are normal. GU: Deferred   ____________________________________________   LABS (all labs ordered are listed, but only abnormal results are displayed)  Labs Reviewed  BASIC METABOLIC PANEL - Abnormal; Notable for the following components:      Result Value   Glucose, Bld 267 (*)    BUN 21 (*)    Creatinine, Ser 1.23 (*)    GFR, Estimated 53 (*)    All other components within normal limits  CBC WITH DIFFERENTIAL/PLATELET   ____________________________________________    PROCEDURES  Procedure(s) performed (including Critical Care):  Procedures   ____________________________________________   INITIAL IMPRESSION / ASSESSMENT AND PLAN / ED COURSE  Michele [name] was evaluated in Emergency Department on 10/25/2020 for the symptoms described in the history of present illness. She was evaluated in the context of the global COVID-19 pandemic, which necessitated consideration that the patient might be at risk for infection with the SARS-CoV-2 virus that causes COVID-19. Institutional protocols and algorithms that pertain to the evaluation of  patients at risk for COVID-19 are in a state of rapid change based on information released by regulatory bodies including the CDC and federal and state organizations. These policies and algorithms were followed during the patient's care in the ED.    Pt goes in with 1 month of left toe numbness. Pt able to feel pressure just states it feels different then other toes. No signs of infection. Vascular intact. Otherwise neuro intact. Unlikely stroke. Reports chronic issues with sciatica but no bowel or bladder dysfunction, saddle anesthesia. And this has been going on a month so unlikely cord compression otherwise would except to see other changes then the lateral toe numbness. Labs ordered to evaluate for electrolyte abnormality, hyperglycemia.  Labs re-assuring other then elevated sugar- pt has not been taking insulin for a while due to insurance issues with covering.   Lengthy discussion with pt could be sciatica vs diabetic neuropathy but given not progressing over a month okay to continue to watch now. F.u with pcp and return if changing or worsening. Discussed keeping an eye on feet to ensure not developing any foot wounds and controlling glucoses.        ____________________________________________   FINAL CLINICAL IMPRESSION(S) / ED DIAGNOSES   Final diagnoses:  Paresthesia  Hyperglycemia      MEDICATIONS GIVEN DURING THIS VISIT:  Medications - No data to display   ED Discharge Orders     None        Note:  This document was prepared using Dragon voice recognition software and may include unintentional dictation errors.    Concha Se, MD 10/26/20 [phone]

## 2020-10-25 NOTE — Discharge Instructions (Addendum)
This could be the start of diabetic neuropathy- keep your sugars well controlled/ discussed further with pcp   Return if numbness going up leg/weakness

## 2020-10-25 NOTE — ED Triage Notes (Signed)
Pt reports that her left toe has been numb for a month. She states that she cant feel it when she walks on it. Its color is WNL. She also is concerned about a spot on her left thigh. She states that she has been feeling doctors but they are not listening

## 2020-12-02 ENCOUNTER — Emergency Department: Payer: Self-pay

## 2020-12-02 ENCOUNTER — Emergency Department
Admission: EM | Admit: 2020-12-02 | Discharge: 2020-12-03 | Disposition: A | Discharge status: Home / Self Care | Payer: Self-pay | Attending: Emergency Medicine | Admitting: Emergency Medicine

## 2020-12-02 ENCOUNTER — Other Ambulatory Visit: Payer: Self-pay

## 2020-12-02 DIAGNOSIS — Z20822 Contact with and (suspected) exposure to covid-19: Secondary | ICD-10-CM | POA: Insufficient documentation

## 2020-12-02 DIAGNOSIS — E119 Type 2 diabetes mellitus without complications: Secondary | ICD-10-CM | POA: Insufficient documentation

## 2020-12-02 DIAGNOSIS — B349 Viral infection, unspecified: Secondary | ICD-10-CM | POA: Insufficient documentation

## 2020-12-02 DIAGNOSIS — Z794 Long term (current) use of insulin: Secondary | ICD-10-CM | POA: Insufficient documentation

## 2020-12-02 DIAGNOSIS — Z7984 Long term (current) use of oral hypoglycemic drugs: Secondary | ICD-10-CM | POA: Insufficient documentation

## 2020-12-02 DIAGNOSIS — Z79899 Other long term (current) drug therapy: Secondary | ICD-10-CM | POA: Insufficient documentation

## 2020-12-02 DIAGNOSIS — F17220 Nicotine dependence, chewing tobacco, uncomplicated: Secondary | ICD-10-CM | POA: Insufficient documentation

## 2020-12-02 DIAGNOSIS — I10 Essential (primary) hypertension: Secondary | ICD-10-CM | POA: Insufficient documentation

## 2020-12-02 LAB — COMPREHENSIVE METABOLIC PANEL
ALT: 18 U/L (ref 0–44)
AST: 15 U/L (ref 15–41)
Albumin: 3.8 g/dL (ref 3.5–5.0)
Alkaline Phosphatase: 82 U/L (ref 38–126)
Anion gap: 10 (ref 5–15)
BUN: 17 mg/dL (ref 6–20)
CO2: 24 mmol/L (ref 22–32)
Calcium: 9.1 mg/dL (ref 8.9–10.3)
Chloride: 102 mmol/L (ref 98–111)
Creatinine, Ser: 0.91 mg/dL (ref 0.44–1.00)
GFR, Estimated: >60 mL/min (ref >60)
Glucose, Bld: 189 mg/dL — ABNORMAL HIGH (ref 70–99)
Potassium: 3.7 mmol/L (ref 3.5–5.1)
Sodium: 136 mmol/L (ref 135–145)
Total Bilirubin: 0.8 mg/dL (ref 0.3–1.2)
Total Protein: 7.1 g/dL (ref 6.5–8.1)

## 2020-12-02 LAB — CBC WITH DIFFERENTIAL/PLATELET
Abs Immature Granulocytes: 0.02 10*3/uL (ref 0.00–0.07)
Basophils Absolute: 0 10*3/uL (ref 0.0–0.1)
Basophils Relative: 1 %
Eosinophils Absolute: 0.1 10*3/uL (ref 0.0–0.5)
Eosinophils Relative: 2 %
HCT: 41.3 % (ref 36.0–46.0)
Hemoglobin: 14 g/dL (ref 12.0–15.0)
Immature Granulocytes: 0 %
Lymphocytes Relative: 28 %
Lymphs Abs: 1.5 10*3/uL (ref 0.7–4.0)
MCH: 27.2 pg (ref 26.0–34.0)
MCHC: 33.9 g/dL (ref 30.0–36.0)
MCV: 80.2 fL (ref 80.0–100.0)
Monocytes Absolute: 0.5 10*3/uL (ref 0.1–1.0)
Monocytes Relative: 9 %
Neutro Abs: 3.3 10*3/uL (ref 1.7–7.7)
Neutrophils Relative %: 60 %
Platelets: 271 10*3/uL (ref 150–400)
RBC: 5.15 MIL/uL — ABNORMAL HIGH (ref 3.87–5.11)
RDW: 13.1 % (ref 11.5–15.5)
WBC: 5.5 10*3/uL (ref 4.0–10.5)
nRBC: 0 % (ref 0.0–0.2)

## 2020-12-02 LAB — GROUP A STREP BY PCR: Group A Strep by PCR: NOT DETECTED

## 2020-12-02 LAB — TROPONIN I (HIGH SENSITIVITY): Troponin I (High Sensitivity): 5 ng/L (ref <18)

## 2020-12-02 MED ORDER — KETOROLAC TROMETHAMINE 30 MG/ML IJ SOLN
15.0000 mg | Freq: Once | INTRAMUSCULAR | Status: AC
  Administered 2020-12-02: 15 mg via INTRAVENOUS
  Filled 2020-12-02: qty 1

## 2020-12-02 MED ORDER — GUAIFENESIN-CODEINE 100-10 MG/5ML PO SOLN
5.0000 mL | Freq: Once | ORAL | Status: AC
  Administered 2020-12-02: 5 mL via ORAL
  Filled 2020-12-02: qty 5

## 2020-12-02 MED ORDER — METOCLOPRAMIDE HCL 5 MG/ML IJ SOLN
10.0000 mg | Freq: Once | INTRAMUSCULAR | Status: AC
  Administered 2020-12-02: 10 mg via INTRAVENOUS
  Filled 2020-12-02: qty 2

## 2020-12-02 MED ORDER — DEXAMETHASONE 1 MG/ML PO CONC
10.0000 mg | Freq: Once | ORAL | Status: AC
  Administered 2020-12-03: 10 mg via ORAL
  Filled 2020-12-02: qty 10

## 2020-12-02 MED ORDER — SODIUM CHLORIDE 0.9 % IV BOLUS
1000.0000 mL | Freq: Once | INTRAVENOUS | Status: AC
  Administered 2020-12-02: 1000 mL via INTRAVENOUS

## 2020-12-02 MED ORDER — DIPHENHYDRAMINE HCL 50 MG/ML IJ SOLN
12.5000 mg | Freq: Once | INTRAMUSCULAR | Status: AC
  Administered 2020-12-02: 12.5 mg via INTRAVENOUS
  Filled 2020-12-02: qty 1

## 2020-12-02 MED ORDER — ACETAMINOPHEN 500 MG PO TABS
1000.0000 mg | ORAL_TABLET | Freq: Once | ORAL | Status: AC
  Administered 2020-12-02: 1000 mg via ORAL
  Filled 2020-12-02: qty 2

## 2020-12-02 NOTE — ED Provider Notes (Signed)
[facility] Emergency Department Provider Note  ____________________________________________   None    (approximate)  I have reviewed the triage vital signs    HISTORY  Chief Complaint Weakness and Cough    HPI Michele [name] is a 55 y.o. female who presents with viral symptoms. Pt reports multiple symptoms including severe cough, not be able to sleep at night, weakness, fatigue, headache, muscle pains all over.  Reports these have been going on for 3 days, constant, not better with over-the-counter cough syrups, nothing makes them worse.  She reports a little bit of shortness of breath associated with it as well.  Reports having her COVID vaccines.  Was recently tested for COVID and strep and were negative a few days ago.  She also reports having a sore throat not been able to eat as well as normal.  Denies any rash or tick bites.     Past Medical History:  Diagnosis Date   Diabetes mellitus without complication (HCC)    Hypertension    Migraines     Patient Active Problem List   Diagnosis Date Noted   Lumbar facet joint syndrome 03/21/2018   Chronic bilateral low back pain without sciatica 03/21/2018   Chronic pain syndrome 03/21/2018   Obesity, morbid, BMI 40.0-49.9 (HCC) 07/12/2017   Bilateral occipital neuralgia 09/14/2016   Neck pain 12/16/2014   Migraines 04/28/2014   Diabetes mellitus type 2, uncomplicated (HCC) 04/28/2014    Past Surgical History:  Procedure Laterality Date   ABDOMINAL HYSTERECTOMY     ANKLE SURGERY     CESAREAN SECTION     KNEE SURGERY     WRIST SURGERY      Prior to Admission medications   Medication Sig Start Date End Date Taking? Authorizing Provider  amitriptyline (ELAVIL) 50 MG tablet Take 50 mg by mouth at bedtime. 12/09/18   [provider]  atorvastatin (LIPITOR) 20 MG tablet Take 20 mg by mouth daily at 6 PM.    [provider]  benzonatate (TESSALON PERLES) 100 MG capsule Take 1  capsule (100 mg total) by mouth 3 (three) times daily as needed for cough. 08/30/20 08/30/21  Concha Se, MD  cyclobenzaprine (FLEXERIL) 5 MG tablet Take 1 tablet (5 mg total) by mouth 3 (three) times daily as needed for muscle spasms. 07/04/20   Menshew, Charlesetta Ivory, PA-C  glipiZIDE (GLUCOTROL) 5 MG tablet Take 5 mg by mouth daily before breakfast.    [provider]  guaiFENesin-codeine (ROBITUSSIN AC) 100-10 MG/5ML syrup Take 5 mLs by mouth 3 (three) times daily as needed for cough. 08/30/20   Concha Se, MD  HYDROcodone-acetaminophen (NORCO/VICODIN) 5-325 MG tablet Take 1 tablet by mouth every 4 (four) hours as needed for moderate pain. 09/22/20 09/22/21  Cuthriell, Delorise Royals, PA-C  insulin glargine (LANTUS) 100 UNIT/ML injection Inject 18 Units into the skin at bedtime.     [provider]  metFORMIN (GLUCOPHAGE) 1000 MG tablet Take 1 tablet (1,000 mg total) by mouth 2 (two) times daily with a meal. Patient taking differently: Take 500 mg by mouth daily.  03/29/17 05/19/19  Emily Filbert, MD  methocarbamol (ROBAXIN) 500 MG tablet Take 1 tablet (500 mg total) by mouth 4 (four) times daily. 09/22/20   Cuthriell, Delorise Royals, PA-C  ondansetron (ZOFRAN ODT) 4 MG disintegrating tablet Take 1 tablet (4 mg total) by mouth every 8 (eight) hours as needed for nausea or vomiting. 08/30/20   Concha Se, MD  pantoprazole (PROTONIX) 20 MG tablet Take 1 tablet (20 mg total) by mouth daily. 05/13/19 05/12/20  Jene Every, MD  predniSONE (DELTASONE) 50 MG tablet Take 1 tablet (50 mg total) by mouth daily with breakfast. 09/22/20   Cuthriell, Delorise Royals, PA-C  sucralfate (CARAFATE) 1 g tablet Take 1 tablet (1 g total) by mouth 4 (four) times daily for 15 days. 05/13/19 05/28/19  Jene Every, MD  SUMAtriptan-naproxen (TREXIMET) 85-500 MG per tablet Take 1 tablet by mouth every 2 (two) hours as needed for migraine.    [provider]  topiramate (TOPAMAX) 100 MG tablet Take 100 mg  by mouth 2 (two) times daily.    [provider]  triamterene-hydrochlorothiazide (MAXZIDE-25) 37.5-25 MG per tablet Take 1 tablet by mouth daily.    [provider]  verapamil (COVERA HS) 180 MG (CO) 24 hr tablet Take 180 mg by mouth at bedtime.    [provider]    Allergies Patient has no known allergies.  Family History  Problem Relation Age of Onset   Breast cancer Neg Hx     Social History Social History   Tobacco Use   Smoking status: Never   Smokeless tobacco: Current    Types: Snuff  Vaping Use   Vaping Use: Never used  Substance Use Topics   Alcohol use: Yes    Comment: Occasional   Drug use: No      Review of Systems Constitutional: Fevers Eyes: No visual changes. ENT: Positive sore throat Cardiovascular: Denies chest pain. Respiratory: Shortness of breath Gastrointestinal: No abdominal pain.  Positive nausea, no vomiting.  No diarrhea.  No constipation. Genitourinary: Negative for dysuria. Musculoskeletal: Positive muscle aches, back pain Skin: Negative for rash. Neurological: Positive headache, no focal weakness or numbness. All other ROS negative ____________________________________________   PHYSICAL EXAM:  VITAL SIGNS: ED Triage Vitals  Enc Vitals Group     BP 12/02/20 1753 (!) 150/99     Pulse Rate 12/02/20 1753 83     Resp 12/02/20 1753 18     Temp 12/02/20 1753 100.3 F (37.9 C)     Temp Source 12/02/20 1753 Oral     SpO2 12/02/20 1753 95 %     Weight --      Height --      Head Circumference --      Peak Flow --      Pain Score 12/02/20 1755 10     Pain Loc --      Pain Edu? --      Excl. in GC? --     Constitutional: Alert and oriented.  Appears uncomfortable Eyes: Conjunctivae are normal. EOMI. Head: Atraumatic. Nose: No congestion/rhinnorhea. Mouth/Throat: Mucous membranes are moist.  OP without any exudates noted or peritonsillar abscess Neck: No stridor. Trachea Midline. FROM Cardiovascular:  Normal rate, regular rhythm. Good peripheral circulation. Respiratory: no audible stridor, no increased work of breathing.  Patient frequently coughing Gastrointestinal: Soft and nontender. No distention.  Musculoskeletal: No lower extremity tenderness nor edema.  No joint effusions. Neurologic:  Normal speech and language. No gross focal neurologic deficits are appreciated.  Skin:  Skin is warm, dry and intact. No rash noted. Psychiatric: Mood and affect are normal. Speech and behavior are normal. GU: Deferred   ____________________________________________   LABS (all labs ordered are listed, but only abnormal results are displayed)  Labs Reviewed  COMPREHENSIVE METABOLIC PANEL - Abnormal; Notable for the following components:      Result Value   Glucose,  Bld 189 (*)    All other components within normal limits  CBC WITH DIFFERENTIAL/PLATELET - Abnormal; Notable for the following components:   RBC 5.15 (*)    All other components within normal limits  RESP PANEL BY RT-PCR (FLU A&B, COVID) ARPGX2  URINALYSIS, COMPLETE (UACMP) WITH MICROSCOPIC  TROPONIN I (HIGH SENSITIVITY)   ____________________________________________   RADIOLOGY Vela Prose, personally viewed and evaluated these images (plain radiographs) as part of my medical decision making, as well as reviewing the written report by the radiologist.  ED MD interpretation: No pneumonia  Official radiology report(s): DG Chest 2 View  Result Date: 12/02/2020 CLINICAL DATA:  Cough and body aches. EXAM: CHEST - 2 VIEW COMPARISON:  Chest x-ray dated August 30, 2020. FINDINGS: The heart size and mediastinal contours are within normal limits. Both lungs are clear. The visualized skeletal structures are unremarkable. IMPRESSION: No active cardiopulmonary disease. Electronically Signed   By: Obie Dredge M.D.   On: 12/02/2020 18:22    ____________________________________________   PROCEDURES  Procedure(s) performed  (including Critical Care):  Procedures   EKG my interpretation is normal sinus rate of 82 without any ST elevation or T wave inversions, normal intervals ____________________________________________   INITIAL IMPRESSION / ASSESSMENT AND PLAN / ED COURSE  Brycelyn Gambino was evaluated in Emergency Department on 12/02/2020 for the symptoms described in the history of present illness. She was evaluated in the context of the global COVID-19 pandemic, which necessitated consideration that the patient might be at risk for infection with the SARS-CoV-2 virus that causes COVID-19. Institutional protocols and algorithms that pertain to the evaluation of patients at risk for COVID-19 are in a state of rapid change based on information released by regulatory bodies including the CDC and federal and state organizations. These policies and algorithms were followed during the patient's care in the ED.     Pt presents with multiple symptoms.  Given the prevalence of  COVID 19 I suspect this is mostly likely secondary to viral illness such as COVID 19.  Also check for strep given patient's report of sore throat.  Chest x-ray ordered evaluate for pneumonia, labs to evaluate for electrolyte abnormalities, AKI.  We will treat patient's symptoms with some Decadron to help with the sore throat, migraine cocktail.   Cardiac markers negative, labs without any electrolyte abnormalities  Patient be handed off to: Coming team pending COVID swab, p.o. challenge       ____________________________________________   FINAL CLINICAL IMPRESSION(S) / ED DIAGNOSES   Final diagnoses:  Viral illness      MEDICATIONS GIVEN DURING THIS VISIT:  Medications  dexamethasone (DECADRON) 1 MG/ML solution 10 mg (has no administration in time range)  sodium chloride 0.9 % bolus 1,000 mL (1,000 mLs Intravenous New Bag/Given 12/02/20 2245)  ketorolac (TORADOL) 30 MG/ML injection 15 mg (15 mg Intravenous Given 12/02/20 2245)   acetaminophen (TYLENOL) tablet 1,000 mg (1,000 mg Oral Given 12/02/20 2243)  metoCLOPramide (REGLAN) injection 10 mg (10 mg Intravenous Given 12/02/20 2244)  diphenhydrAMINE (BENADRYL) injection 12.5 mg (12.5 mg Intravenous Given 12/02/20 2244)  guaiFENesin-codeine 100-10 MG/5ML solution 5 mL (5 mLs Oral Given 12/02/20 2243)     ED Discharge Orders     None        Note:  This document was prepared using Dragon voice recognition software and may include unintentional dictation errors.   Concha Se, MD 12/02/20 2258

## 2020-12-02 NOTE — ED Triage Notes (Signed)
Pt presents to ED with multiple complaints such as cough, weakness, fatigue, and headache. Pt states tested for COVID this past Wednesday. Pt is A&Ox4.

## 2020-12-02 NOTE — ED Provider Notes (Signed)
Emergency Medicine Provider Triage Evaluation Note  Michele [name] , a 55 y.o. female  was evaluated in triage.  Pt complains of multiple complaints to include cough, weakness, fatigue, headache.  Symptoms have been ongoing x3 days.  Patient denies any visual changes, neck pain or stiffness, chest pain, abdominal pain..  Review of Systems  Positive: Headache, cough, weakness, fatigue Negative: Visual changes, neck pain, chest pain, abdominal pain  Physical Exam  BP (!) 150/99 (BP Location: Right Arm)   Pulse 83   Temp 100.3 F (37.9 C) (Oral)   Resp 18   SpO2 95%  Gen:   Awake, no distress   Resp:  Normal effort  MSK:   Moves extremities without difficulty  Other:    Medical Decision Making  Medically screening exam initiated at 6:01 PM.  Appropriate orders placed.  Michele [name] was informed that the remainder of the evaluation will be completed by another provider, this initial triage assessment does not replace that evaluation, and the importance of remaining in the ED until their evaluation is complete.  Patient presents with multiple COVID-like symptoms.  Will test for COVID, basic labs and chest x-ray at this time   Michele [name] 12/02/20 1802    Concha Se, MD 12/02/20 [phone]

## 2020-12-03 LAB — URINALYSIS, COMPLETE (UACMP) WITH MICROSCOPIC
Bilirubin Urine: NEGATIVE
Glucose, UA: 50 mg/dL — AB
Hgb urine dipstick: NEGATIVE
Ketones, ur: NEGATIVE mg/dL
Leukocytes,Ua: NEGATIVE
Nitrite: NEGATIVE
Protein, ur: NEGATIVE mg/dL
Specific Gravity, Urine: 1.014 (ref 1.005–1.030)
pH: 5 (ref 5.0–8.0)

## 2020-12-03 LAB — RESP PANEL BY RT-PCR (FLU A&B, COVID) ARPGX2
Influenza A by PCR: NEGATIVE
Influenza B by PCR: NEGATIVE
SARS Coronavirus 2 by RT PCR: NEGATIVE

## 2020-12-03 MED ORDER — KETOROLAC TROMETHAMINE 30 MG/ML IJ SOLN
15.0000 mg | Freq: Once | INTRAMUSCULAR | Status: AC
  Administered 2020-12-03: 15 mg via INTRAVENOUS
  Filled 2020-12-03: qty 1

## 2020-12-03 MED ORDER — PROCHLORPERAZINE EDISYLATE 10 MG/2ML IJ SOLN
10.0000 mg | Freq: Once | INTRAMUSCULAR | Status: AC
  Administered 2020-12-03: 10 mg via INTRAVENOUS
  Filled 2020-12-03: qty 2

## 2020-12-03 MED ORDER — HYDROCOD POLST-CPM POLST ER 10-8 MG/5ML PO SUER: 5.0000 mL | Freq: Two times a day (BID) | ORAL | 0 refills | Status: DC | PRN

## 2020-12-03 NOTE — ED Notes (Signed)
Pt tolerated a cup a water and held it down

## 2020-12-03 NOTE — Discharge Instructions (Addendum)
You may alternate Tylenol 1000 mg every 6 hours as needed for pain, fever and Ibuprofen 800 mg every 8 hours as needed for pain, fever.  Please take Ibuprofen with food.  Do not take more than 4000 mg of Tylenol (acetaminophen) in a 24 hour period.   Your labs, chest x-ray, urine were reassuring today.  Your COVID and flu swabs were negative.  I suspect that your symptoms are due to a viral illness.  You do not need antibiotics at this time.

## 2020-12-03 NOTE — ED Provider Notes (Signed)
  Physical Exam  BP (!) 151/86   Pulse 69   Temp 100.3 F (37.9 C) (Oral)   Resp 18   SpO2 96%   Physical Exam  ED Course/Procedures     Procedures  MDM  Assumed care of patient at shift change.  Patient presents with symptoms of viral illness.  Work-up here has been unremarkable including labs, chest x-ray and urine.  Patient feels better after multiple IV medications and has been able to tolerate p.o.  I feel she is safe to be discharged home.  Recommended alternating Tylenol, Motrin for fever and pain.  Will discharge with Tussionex to help with her cough.  Abdominal exam is benign.  Hemodynamically stable here, nontoxic appearing.  At this time, I do not feel there is any life-threatening condition present. I have reviewed, interpreted and discussed all results (EKG, imaging, lab, urine as appropriate) and exam findings with patient/family. I have reviewed nursing notes and appropriate previous records.  I feel the patient is safe to be discharged home without further emergent workup and can continue workup as an outpatient as needed. Discussed usual and customary return precautions. Patient/family verbalize understanding and are comfortable with this plan.  Outpatient follow-up has been provided as needed. All questions have been answered.      Adalind Weitz, Layla Maw, DO 12/03/20 [phone]

## 2020-12-06 ENCOUNTER — Telehealth: Payer: Self-pay | Admitting: Emergency Medicine

## 2020-12-06 MED ORDER — HYDROCOD POLST-CPM POLST ER 10-8 MG/5ML PO SUER: 5.0000 mL | Freq: Two times a day (BID) | ORAL | 0 refills | Status: AC

## 2020-12-06 MED ORDER — HYDROCOD POLST-CPM POLST ER 10-8 MG/5ML PO SUER: 5.0000 mL | Freq: Two times a day (BID) | ORAL | 0 refills | Status: DC | PRN

## 2020-12-06 NOTE — Telephone Encounter (Signed)
Na

## 2020-12-06 NOTE — Telephone Encounter (Signed)
Emergency department provider follow-up  I was asked by clerical staff to resend prescription to different pharmacy as the original pharmacy was unable to fill this prescription.  I sent the new prescription to the [facility] on [address] and patient was informed to pick up the prescriptions there.

## 2020-12-09 NOTE — ED Provider Notes (Signed)
Patient request prescription for Tussionex.  She had previously got 1 but the doctor who noted's DEA number apparently was expired and then she got another one but the pharmacy does not have any and cannot get any so she is going to a different pharmacy and that pharmacy is calling the first pharmacy to cancel the prescription there and we will get it to the other pharmacy.   Arnaldo Natal, MD 12/09/20 1218

## 2020-12-16 IMAGING — CR DG CHEST 2V
1 series · 2 of 2 positions shown · non-contrast
Comparison: None.

CLINICAL DATA: Mid to left chest pain and shortness of breath over
the last 3 days.

EXAM:
CHEST - 2 VIEW

[Series 1: dg chest 2 view · 0.14mm/px · 2 of 2 slices shown]
[im 1/2]
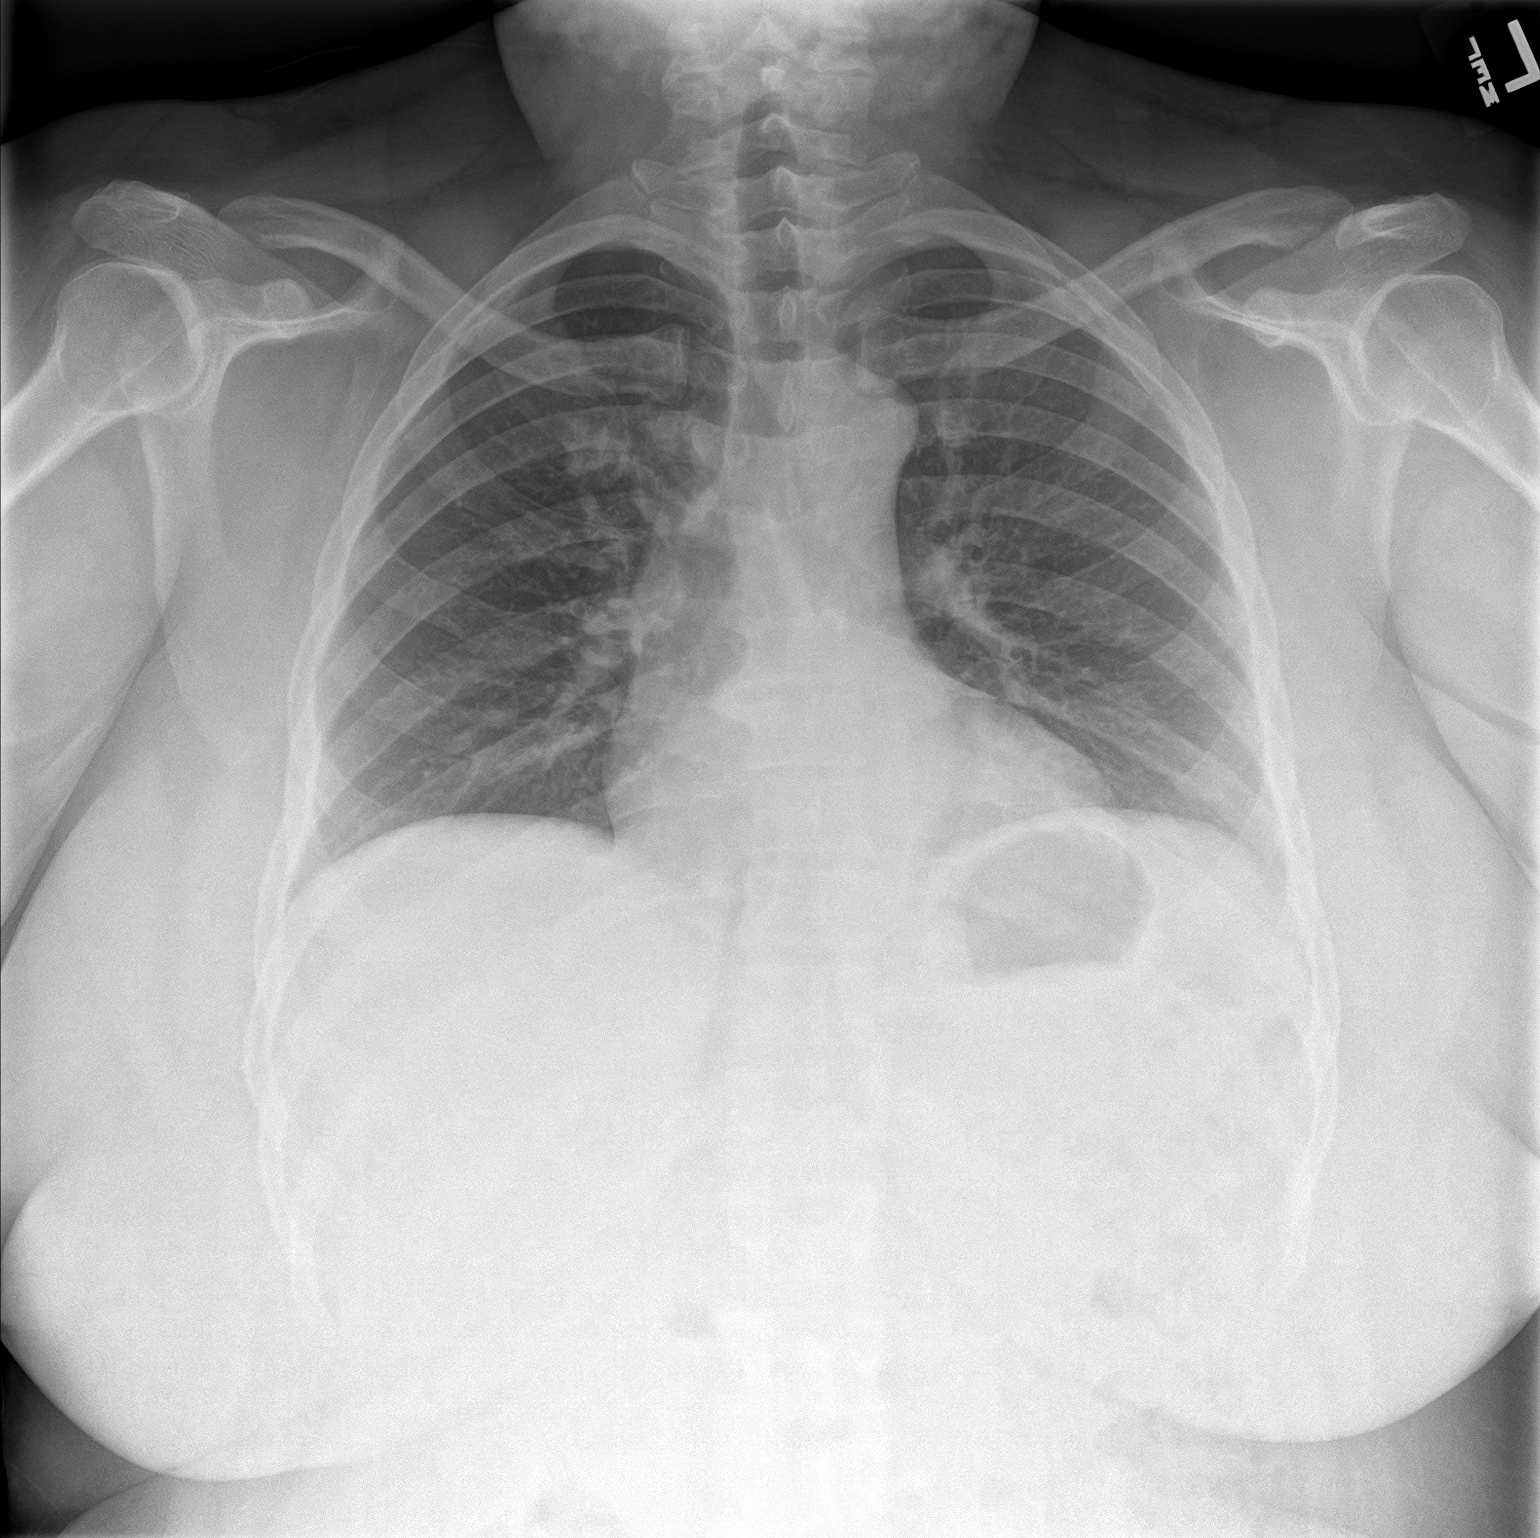
[im 2/2]
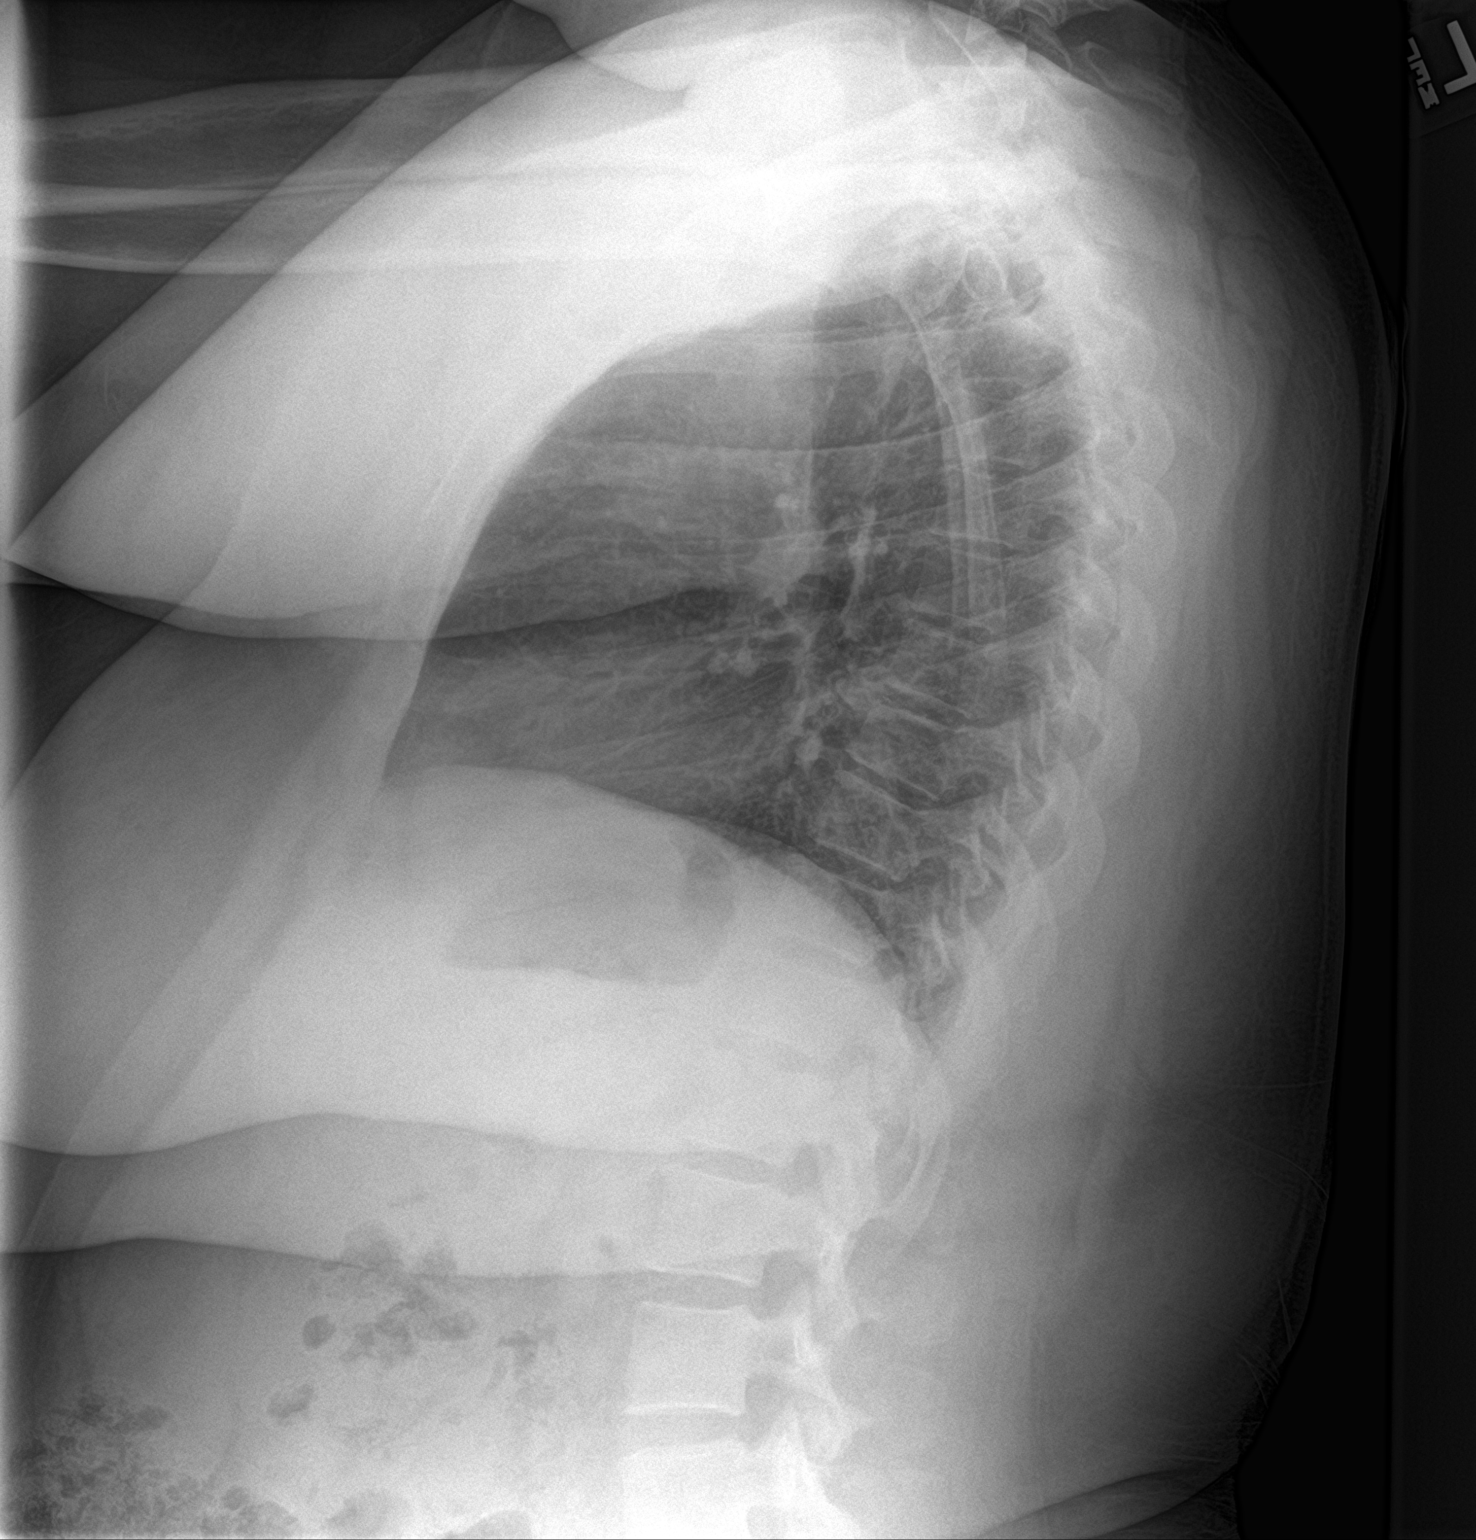

[2 of 2 positions shown; findings below may reference images not displayed]

FINDINGS: The heart size and mediastinal contours are within normal limits.
Both lungs are clear. The visualized skeletal structures are
unremarkable.
IMPRESSION: No active cardiopulmonary disease.

## 2021-02-05 ENCOUNTER — Other Ambulatory Visit: Payer: Self-pay

## 2021-02-05 DIAGNOSIS — Z79899 Other long term (current) drug therapy: Secondary | ICD-10-CM | POA: Insufficient documentation

## 2021-02-05 DIAGNOSIS — Z7984 Long term (current) use of oral hypoglycemic drugs: Secondary | ICD-10-CM | POA: Insufficient documentation

## 2021-02-05 DIAGNOSIS — I10 Essential (primary) hypertension: Secondary | ICD-10-CM | POA: Insufficient documentation

## 2021-02-05 DIAGNOSIS — E119 Type 2 diabetes mellitus without complications: Secondary | ICD-10-CM | POA: Insufficient documentation

## 2021-02-05 DIAGNOSIS — M5431 Sciatica, right side: Secondary | ICD-10-CM | POA: Insufficient documentation

## 2021-02-05 DIAGNOSIS — R220 Localized swelling, mass and lump, head: Secondary | ICD-10-CM | POA: Insufficient documentation

## 2021-02-05 DIAGNOSIS — Z794 Long term (current) use of insulin: Secondary | ICD-10-CM | POA: Insufficient documentation

## 2021-02-05 DIAGNOSIS — M5432 Sciatica, left side: Secondary | ICD-10-CM | POA: Insufficient documentation

## 2021-02-05 LAB — BASIC METABOLIC PANEL
Anion gap: 7 (ref 5–15)
BUN: 16 mg/dL (ref 6–20)
CO2: 25 mmol/L (ref 22–32)
Calcium: 8.5 mg/dL — ABNORMAL LOW (ref 8.9–10.3)
Chloride: 105 mmol/L (ref 98–111)
Creatinine, Ser: 0.9 mg/dL (ref 0.44–1.00)
GFR, Estimated: >60 mL/min (ref >60)
Glucose, Bld: 264 mg/dL — ABNORMAL HIGH (ref 70–99)
Potassium: 3.8 mmol/L (ref 3.5–5.1)
Sodium: 137 mmol/L (ref 135–145)

## 2021-02-05 LAB — CBC
HCT: 41.5 % (ref 36.0–46.0)
Hemoglobin: 13.4 g/dL (ref 12.0–15.0)
MCH: 26.2 pg (ref 26.0–34.0)
MCHC: 32.3 g/dL (ref 30.0–36.0)
MCV: 81.2 fL (ref 80.0–100.0)
Platelets: 292 10*3/uL (ref 150–400)
RBC: 5.11 MIL/uL (ref 3.87–5.11)
RDW: 12.9 % (ref 11.5–15.5)
WBC: 7.6 10*3/uL (ref 4.0–10.5)
nRBC: 0 % (ref 0.0–0.2)

## 2021-02-05 NOTE — ED Triage Notes (Signed)
Pt woke up with l side facial swelling. It has progressed into body aches, with dizzy spells, face feels tight.  Denies any n/v or chest pain, or loc.

## 2021-02-06 ENCOUNTER — Emergency Department
Admission: EM | Admit: 2021-02-06 | Discharge: 2021-02-06 | Disposition: A | Discharge status: Home / Self Care | Payer: Self-pay | Attending: Emergency Medicine | Admitting: Emergency Medicine

## 2021-02-06 DIAGNOSIS — R220 Localized swelling, mass and lump, head: Secondary | ICD-10-CM

## 2021-02-06 DIAGNOSIS — M5431 Sciatica, right side: Secondary | ICD-10-CM

## 2021-02-06 MED ORDER — DEXAMETHASONE 10 MG/ML FOR PEDIATRIC ORAL USE
10.0000 mg | Freq: Once | INTRAMUSCULAR | Status: AC
  Administered 2021-02-06: 10 mg via ORAL
  Filled 2021-02-06: qty 1

## 2021-02-06 MED ORDER — KETOROLAC TROMETHAMINE 30 MG/ML IJ SOLN
30.0000 mg | Freq: Once | INTRAMUSCULAR | Status: AC
  Administered 2021-02-06: 30 mg via INTRAMUSCULAR
  Filled 2021-02-06: qty 1

## 2021-02-06 MED ORDER — LIDOCAINE 5 % EX PTCH
1.0000 | MEDICATED_PATCH | CUTANEOUS | Status: DC
  Administered 2021-02-06: 1 via TRANSDERMAL
  Filled 2021-02-06: qty 1

## 2021-02-06 MED ORDER — CYCLOBENZAPRINE HCL 10 MG PO TABS
5.0000 mg | ORAL_TABLET | Freq: Once | ORAL | Status: AC
  Administered 2021-02-06: 5 mg via ORAL
  Filled 2021-02-06: qty 1

## 2021-02-06 MED ORDER — ACETAMINOPHEN 500 MG PO TABS
1000.0000 mg | ORAL_TABLET | Freq: Once | ORAL | Status: AC
  Administered 2021-02-06: 1000 mg via ORAL
  Filled 2021-02-06: qty 2

## 2021-02-06 MED ORDER — CYCLOBENZAPRINE HCL 5 MG PO TABS: 5.0000 mg | ORAL_TABLET | Freq: Three times a day (TID) | ORAL | 0 refills | Status: DC | PRN

## 2021-02-06 NOTE — Discharge Instructions (Addendum)
As we discussed, your facial swelling resolved on its own prior to being seen.  This could be due to the way you were sleeping, or could be an unknown allergy.  Remember that we gave you a one-time dose of a medication called Decadron so your blood glucose level may be higher than usual over the next couple of days, but it should also help not only with your sciatica but with any allergic reaction you may be having.  Please follow-up as recommended in these documents.  Consider taking a daily cetirizine (Zyrtec) or taking Benadryl as needed if you are having any additional signs of swelling.    Return to the emergency department if you develop new or worsening symptoms that concern you.

## 2021-02-06 NOTE — ED Provider Notes (Signed)
[facility] Emergency Department Provider Note  ____________________________________________   Event Date/Time   First MD Initiated Contact with Patient 02/06/21 [phone]     (approximate)  I have reviewed the triage vital signs and the nursing notes.   HISTORY  Chief Complaint Generalized Body Aches (Facial swelling, dizzy, body aches)    HPI Denese Mentink is a 55 y.o. female who initially presented for evaluation of bilateral facial swelling but that has completely resolved.  She also reports that her sciatica is acting up and is worse than usual.  She has chronic sciatica and is trying to get into the pain clinic for help with management but has not been able to do so so far.  She said for the last couple days her pain is gotten worse in her lower back and radiates down both sides of her legs and makes it feel like her thighs are burning.  Is severe moving around makes it worse, nothing in particular makes it better.  The facial swelling was of concern because she woke up this morning with her face swollen and she said that about a year ago she spent several days in the hospital with facial swelling and they were not able to identify what caused it.  Tonight she is having no difficulty breathing and no difficulty swallowing.  She has no known allergies and is not taking any lisinopril or other ACE inhibitor.  She reports, however, that the swelling went away on its own while she was waiting to be seen.  She denies fever, sore throat, chest pain, shortness of breath, nausea, vomiting, and abdominal pain.  She does not report any history of urinary incontinence nor urinary retention, nor any bowel incontinence.     Past Medical History:  Diagnosis Date   Diabetes mellitus without complication (HCC)    Hypertension    Migraines     Patient Active Problem List   Diagnosis Date Noted   Lumbar facet joint syndrome 03/21/2018   Chronic bilateral low back  pain without sciatica 03/21/2018   Chronic pain syndrome 03/21/2018   Obesity, morbid, BMI 40.0-49.9 (HCC) 07/12/2017   Bilateral occipital neuralgia 09/14/2016   Neck pain 12/16/2014   Migraines 04/28/2014   Diabetes mellitus type 2, uncomplicated (HCC) 04/28/2014    Past Surgical History:  Procedure Laterality Date   ABDOMINAL HYSTERECTOMY     ANKLE SURGERY     CESAREAN SECTION     KNEE SURGERY     WRIST SURGERY      Prior to Admission medications   Medication Sig Start Date End Date Taking? Authorizing Provider  amitriptyline (ELAVIL) 50 MG tablet Take 50 mg by mouth at bedtime. 12/09/18   [provider]  atorvastatin (LIPITOR) 20 MG tablet Take 20 mg by mouth daily at 6 PM.    [provider]  benzonatate (TESSALON PERLES) 100 MG capsule Take 1 capsule (100 mg total) by mouth 3 (three) times daily as needed for cough. 08/30/20 08/30/21  Concha Se, MD  chlorpheniramine-HYDROcodone (TUSSIONEX PENNKINETIC ER) 10-8 MG/5ML SUER Take 5 mLs by mouth every 12 (twelve) hours as needed for cough. 12/06/20   Merwyn Katos, MD  cyclobenzaprine (FLEXERIL) 5 MG tablet Take 1 tablet (5 mg total) by mouth 3 (three) times daily as needed for muscle spasms (sciatica). 02/06/21   Loleta Rose, MD  glipiZIDE (GLUCOTROL) 5 MG tablet Take 5 mg by mouth daily before breakfast.    [provider]  guaiFENesin-codeine (  ROBITUSSIN AC) 100-10 MG/5ML syrup Take 5 mLs by mouth 3 (three) times daily as needed for cough. 08/30/20   Concha Se, MD  HYDROcodone-acetaminophen (NORCO/VICODIN) 5-325 MG tablet Take 1 tablet by mouth every 4 (four) hours as needed for moderate pain. 09/22/20 09/22/21  Cuthriell, Delorise Royals, PA-C  insulin glargine (LANTUS) 100 UNIT/ML injection Inject 18 Units into the skin at bedtime.     [provider]  metFORMIN (GLUCOPHAGE) 1000 MG tablet Take 1 tablet (1,000 mg total) by mouth 2 (two) times daily with a meal. Patient taking differently: Take  500 mg by mouth daily.  03/29/17 05/19/19  Emily Filbert, MD  methocarbamol (ROBAXIN) 500 MG tablet Take 1 tablet (500 mg total) by mouth 4 (four) times daily. 09/22/20   Cuthriell, Delorise Royals, PA-C  ondansetron (ZOFRAN ODT) 4 MG disintegrating tablet Take 1 tablet (4 mg total) by mouth every 8 (eight) hours as needed for nausea or vomiting. 08/30/20   Concha Se, MD  pantoprazole (PROTONIX) 20 MG tablet Take 1 tablet (20 mg total) by mouth daily. 05/13/19 05/12/20  Jene Every, MD  predniSONE (DELTASONE) 50 MG tablet Take 1 tablet (50 mg total) by mouth daily with breakfast. 09/22/20   Cuthriell, Delorise Royals, PA-C  sucralfate (CARAFATE) 1 g tablet Take 1 tablet (1 g total) by mouth 4 (four) times daily for 15 days. 05/13/19 05/28/19  Jene Every, MD  SUMAtriptan-naproxen (TREXIMET) 85-500 MG per tablet Take 1 tablet by mouth every 2 (two) hours as needed for migraine.    [provider]  topiramate (TOPAMAX) 100 MG tablet Take 100 mg by mouth 2 (two) times daily.    [provider]  triamterene-hydrochlorothiazide (MAXZIDE-25) 37.5-25 MG per tablet Take 1 tablet by mouth daily.    [provider]  verapamil (COVERA HS) 180 MG (CO) 24 hr tablet Take 180 mg by mouth at bedtime.    [provider]    Allergies Patient has no known allergies.  Family History  Problem Relation Age of Onset   Breast cancer Neg Hx     Social History Social History   Tobacco Use   Smoking status: Never   Smokeless tobacco: Current    Types: Snuff  Vaping Use   Vaping Use: Never used  Substance Use Topics   Alcohol use: Yes    Comment: Occasional   Drug use: No    Review of Systems Constitutional: No fever/chills Eyes: No visual changes. ENT: Facial swelling (possible angioedema), now resolved. Cardiovascular: Denies chest pain. Respiratory: Denies shortness of breath. Gastrointestinal: No abdominal pain.  No nausea, no vomiting.  No diarrhea.  No  constipation. Genitourinary: Negative for dysuria. Musculoskeletal: Low back pain radiating down legs consistent with her history of bilateral sciatica. Integumentary: Negative for rash. Neurological: Negative for headaches, focal weakness or numbness.   ____________________________________________   PHYSICAL EXAM:  VITAL SIGNS: ED Triage Vitals  Enc Vitals Group     BP 02/05/21 1908 (!) 160/105     Pulse Rate 02/05/21 1906 88     Resp 02/05/21 1906 18     Temp 02/05/21 1904 98.2 F (36.8 C)     Temp Source 02/05/21 2243 Oral     SpO2 02/05/21 1906 100 %     Weight 02/05/21 1906 110.7 kg (244 lb)     Height 02/05/21 1906 1.676 m (5\' 6" )     Head Circumference --      Peak Flow --  Pain Score 02/05/21 1905 8     Pain Loc --      Pain Edu? --      Excl. in GC? --     Constitutional: Alert and oriented.  Eyes: Conjunctivae are normal.  Head: Atraumatic. Nose: No congestion/rhinnorhea. Mouth/Throat: Patient is wearing a mask. Neck: No stridor.  No meningeal signs.   Cardiovascular: Normal rate, regular rhythm. Good peripheral circulation. Respiratory: Normal respiratory effort.  No retractions. Gastrointestinal: Soft and nontender. No distention.  Musculoskeletal: Lower back pain/tenderness radiating to both legs.  Pain with straight leg raise on both sides. Neurologic:  Normal speech and language. No gross focal neurologic deficits are appreciated.  Skin:  Skin is warm, dry and intact. Psychiatric: Mood and affect are normal. Speech and behavior are normal.  ____________________________________________   LABS (all labs ordered are listed, but only abnormal results are displayed)  Labs Reviewed  BASIC METABOLIC PANEL - Abnormal; Notable for the following components:      Result Value   Glucose, Bld 264 (*)    Calcium 8.5 (*)    All other components within normal limits  CBC   ____________________________________________   INITIAL IMPRESSION / MDM /  ASSESSMENT AND PLAN / ED COURSE  As part of my medical decision making, I reviewed the following data within the electronic MEDICAL RECORD NUMBER Nursing notes reviewed and incorporated, Labs reviewed , Old chart reviewed, and Notes from prior ED visits   Differential diagnosis includes, but is not limited to, acute on chronic sciatica, cauda equina syndrome, ruptured lumbar disc, allergic reaction, angioedema, medication or drug side effect.  Patient's facial swelling has completely resolved and there is no indication that she needs additional treatment.  However this is most likely some sort of allergic reaction versus dependent edema from when she was sleeping.  She is most concerned about her sciatica at this time.  This is a chronic condition and there is no indication she is having an acute nerve impingement such as cauda equina that requires neurosurgical intervention.  I am treating with Lidoderm patch, Toradol 30 mg intramuscular, 1000 mg of Tylenol.  I also gave her a dose of Decadron 10 mg by mouth which should help both with any inflammatory or allergic process that was going on with her face and it may also help with her sciatica.  I warned her that her blood sugar may be higher than usual for the next couple of days and she understands.  She said the Flexeril has worked well for her in the past for sciatica so I gave her 1 dose of 5 mg and I refilled her prior prescription.  I gave her follow-up information with neurosurgery and she will also follow-up with her primary care doctor.  I gave my usual and customary return precautions.           ____________________________________________  FINAL CLINICAL IMPRESSION(S) / ED DIAGNOSES  Final diagnoses:  Bilateral sciatica  Facial swelling     MEDICATIONS GIVEN DURING THIS VISIT:  Medications  lidocaine (LIDODERM) 5 % 1 patch (1 patch Transdermal Patch Applied 02/06/21 0201)  ketorolac (TORADOL) 30 MG/ML injection 30 mg (30 mg  Intramuscular Given 02/06/21 0202)  acetaminophen (TYLENOL) tablet 1,000 mg (1,000 mg Oral Given 02/06/21 0202)  dexamethasone (DECADRON) 10 MG/ML injection for Pediatric ORAL use 10 mg (10 mg Oral Given 02/06/21 0202)  cyclobenzaprine (FLEXERIL) tablet 5 mg (5 mg Oral Given 02/06/21 4536)     ED Discharge Orders  Ordered    cyclobenzaprine (FLEXERIL) 5 MG tablet  3 times daily PRN        02/06/21 0207             Note:  This document was prepared using Dragon voice recognition software and may include unintentional dictation errors.   Loleta Rose, MD 02/06/21 [phone]

## 2021-03-09 ENCOUNTER — Emergency Department: Admission: EM | Admit: 2021-03-09 | Discharge: 2021-03-09 | Discharge status: Against Medical Advice | Payer: Self-pay

## 2021-03-09 NOTE — ED Notes (Signed)
Pt called to triage and she states she is leaving because she cant sit here for the wait.  Pt waiting for ride.

## 2021-06-04 ENCOUNTER — Encounter: Payer: Self-pay | Admitting: Intensive Care

## 2021-06-04 ENCOUNTER — Emergency Department: Payer: BC Managed Care – PPO

## 2021-06-04 ENCOUNTER — Other Ambulatory Visit: Payer: Self-pay

## 2021-06-04 ENCOUNTER — Emergency Department
Admission: EM | Admit: 2021-06-04 | Discharge: 2021-06-04 | Disposition: A | Discharge status: Home / Self Care | Payer: BC Managed Care – PPO | Attending: Emergency Medicine | Admitting: Emergency Medicine

## 2021-06-04 DIAGNOSIS — E119 Type 2 diabetes mellitus without complications: Secondary | ICD-10-CM | POA: Diagnosis not present

## 2021-06-04 DIAGNOSIS — R079 Chest pain, unspecified: Secondary | ICD-10-CM

## 2021-06-04 DIAGNOSIS — R0789 Other chest pain: Secondary | ICD-10-CM | POA: Diagnosis not present

## 2021-06-04 HISTORY — DX: Pure hypercholesterolemia, unspecified: E78.00

## 2021-06-04 HISTORY — DX: Migraine, unspecified, not intractable, without status migrainosus: G43.909

## 2021-06-04 LAB — BASIC METABOLIC PANEL
Anion gap: 9 (ref 5–15)
BUN: 23 mg/dL — ABNORMAL HIGH (ref 6–20)
CO2: 25 mmol/L (ref 22–32)
Calcium: 9.1 mg/dL (ref 8.9–10.3)
Chloride: 105 mmol/L (ref 98–111)
Creatinine, Ser: 1.08 mg/dL — ABNORMAL HIGH (ref 0.44–1.00)
GFR, Estimated: >60 mL/min (ref >60)
Glucose, Bld: 197 mg/dL — ABNORMAL HIGH (ref 70–99)
Potassium: 3.2 mmol/L — ABNORMAL LOW (ref 3.5–5.1)
Sodium: 139 mmol/L (ref 135–145)

## 2021-06-04 LAB — CBC
HCT: 41.1 % (ref 36.0–46.0)
Hemoglobin: 13.2 g/dL (ref 12.0–15.0)
MCH: 25.9 pg — ABNORMAL LOW (ref 26.0–34.0)
MCHC: 32.1 g/dL (ref 30.0–36.0)
MCV: 80.7 fL (ref 80.0–100.0)
Platelets: 278 10*3/uL (ref 150–400)
RBC: 5.09 MIL/uL (ref 3.87–5.11)
RDW: 12.9 % (ref 11.5–15.5)
WBC: 8.2 10*3/uL (ref 4.0–10.5)
nRBC: 0 % (ref 0.0–0.2)

## 2021-06-04 LAB — TROPONIN I (HIGH SENSITIVITY): Troponin I (High Sensitivity): 3 ng/L (ref <18)

## 2021-06-04 MED ORDER — MELOXICAM 15 MG PO TABS: 15.0000 mg | ORAL_TABLET | Freq: Every day | ORAL | 0 refills | Status: DC

## 2021-06-04 MED ORDER — MELOXICAM 7.5 MG PO TABS
15.0000 mg | ORAL_TABLET | Freq: Once | ORAL | Status: AC
  Administered 2021-06-04: 15 mg via ORAL
  Filled 2021-06-04: qty 2

## 2021-06-04 NOTE — ED Notes (Addendum)
Patient placed on monitor leads and b/p cuff, pulse ox. Patient given warm blanket and pillow. ?

## 2021-06-04 NOTE — ED Triage Notes (Signed)
Patient reports she has been having chest pains for "awhile" and then last night worsened on right side causing her SOB.  ?

## 2021-06-04 NOTE — ED Notes (Signed)
D/C and new RX discussed with pt, pt verbalized understanding. NAD noted on D/C.  ?

## 2021-06-04 NOTE — ED Provider Notes (Signed)
? ?[facility] ?Provider Note ? ? Event Date/Time  ? First MD Initiated Contact with Patient 06/04/21 1546   ?  (approximate) ?History  ?Chest Pain ? ?HPI ?Michele [name] is a 56 y.o. female with a past medical history of type 2 diabetes, chronic pain syndrome, and morbid obesity who presents for anterior chest pain that began while she was working last night.  Patient describes central chest pain that 'shoots' around to the left side intermittently since last night.  Patient states that this pain is worsened whenever she is taking a deep breath or with movement.  Patient denies any associated shortness of breath, dyspnea on exertion, lower extremity edema, productive cough, or fever ?Physical Exam  ?Triage Vital Signs: ?ED Triage Vitals  ?Enc Vitals Group  ?   BP 06/04/21 1522 (!) 145/96  ?   Pulse Rate 06/04/21 1522 90  ?   Resp 06/04/21 1522 18  ?   Temp 06/04/21 1522 98.4 ?F (36.9 ?C)  ?   Temp Source 06/04/21 1522 Oral  ?   SpO2 06/04/21 1522 100 %  ?   Weight 06/04/21 1518 243 lb (110.2 kg)  ?   Height 06/04/21 1518 5\' 6"  (1.676 m)  ?   Head Circumference --   ?   Peak Flow --   ?   Pain Score 06/04/21 1518 10  ?   Pain Loc --   ?   Pain Edu? --   ?   Excl. in GC? --   ? ?Most recent vital signs: ?Vitals:  ? 06/04/21 1600 06/04/21 1615  ?BP: 124/77   ?Pulse: 76 79  ?Resp: 15 19  ?Temp:    ?SpO2: 97% 97%  ? ?General: Awake, oriented x4. ?CV:  Good peripheral perfusion.  ?Resp:  Normal effort.  ?Abd:  No distention.  ?Other:  Middle-aged morbidly obese African-American female laying in bed occasionally in mild distress secondary to pain ?ED Results / Procedures / Treatments  ?Labs ?(all labs ordered are listed, but only abnormal results are displayed) ?Labs Reviewed  ?BASIC METABOLIC PANEL - Abnormal; Notable for the following components:  ?    Result Value  ? Potassium 3.2 (*)   ? Glucose, Bld 197 (*)   ? BUN 23 (*)   ? Creatinine, Ser 1.08 (*)   ? All other components within normal  limits  ?CBC - Abnormal; Notable for the following components:  ? MCH 25.9 (*)   ? All other components within normal limits  ?TROPONIN I (HIGH SENSITIVITY)  ? ?EKG ?ED ECG REPORT ?I, 08/04/21, the attending physician, personally viewed and interpreted this ECG. ?Date: 06/04/2021 ?EKG Time: 1518 ?Rate: 89 ?Rhythm: normal sinus rhythm ?QRS Axis: normal ?Intervals: normal ?ST/T Wave abnormalities: normal ?Narrative Interpretation: no evidence of acute ischemia ?RADIOLOGY ?ED MD interpretation: 2 view chest x-ray interpreted by me shows no evidence of acute abnormalities including no pneumonia, pneumothorax, or widened mediastinum.  There is incidental findings of low lung volumes with mildly increased bibasilar interstitial markings concerning for atelectasis or infiltrate ?-Agree with radiology assessment ?Official radiology report(s): ?DG Chest 2 View ? ?Result Date: 06/04/2021 ?CLINICAL DATA:  Chest pain EXAM: CHEST - 2 VIEW COMPARISON:  12/02/2020 FINDINGS: The heart size and mediastinal contours are within normal limits. Low lung volumes. Mildly increased bibasilar interstitial markings. No pleural effusion or pneumothorax. The visualized skeletal structures are unremarkable. IMPRESSION: Low lung volumes with mildly increased bibasilar interstitial markings, which may reflect atelectasis versus infiltrate.  Electronically Signed   By: Duanne Guess D.O.   On: 06/04/2021 15:45   ?PROCEDURES: ?Critical Care performed: No ?.1-3 Lead EKG Interpretation ?Performed by: Merwyn Katos, MD ?Authorized by: Merwyn Katos, MD  ? ?  Interpretation: normal   ?  ECG rate:  81 ?  ECG rate assessment: normal   ?  Rhythm: sinus rhythm   ?  Ectopy: none   ?  Conduction: normal   ?MEDICATIONS ORDERED IN ED: ?Medications  ?meloxicam (MOBIC) tablet 15 mg (has no administration in time range)  ? ?IMPRESSION / MDM / ASSESSMENT AND PLAN / ED COURSE  ?I reviewed the triage vital signs and the nursing notes. ?             ?                ?The patient is on the cardiac monitor to evaluate for evidence of arrhythmia and/or significant heart rate changes. ?Workup: ECG, CXR, CBC, BMP, Troponin ?Findings: ?ECG: No overt evidence of STEMI. No evidence of Brugada?s sign, delta wave, epsilon wave, significantly prolonged QTc, or malignant arrhythmia ?HS Troponin: Negative x1 ?Other Labs unremarkable for emergent problems. ?CXR: Without PTX, PNA, or widened mediastinum ?Last Stress Test:  2014 ?Last Heart Catheterization:  2014 ?HEART Score: 2 ? ?Given History, Exam, and Workup I have low suspicion for ACS, Pneumothorax, Pneumonia, Pulmonary Embolus, Tamponade, Aortic Dissection or other emergent problem as a cause for this presentation.  Given palpable reproduction of pain as well as pleuritic in nature and shooting aspect of patient's complaints, concerning for possible costochondritis especially given patient's body habitus.  Encouraged NSAID use as well as stretching ? ?Reassesment: Prior to discharge patient?s pain was controlled and they were well appearing. ? ?Disposition:  Discharge. Strict return precautions discussed with patient with full understanding. Advised patient to follow up promptly with primary care provider ?  ?FINAL CLINICAL IMPRESSION(S) / ED DIAGNOSES  ? ?Final diagnoses:  ?Nonspecific chest pain  ? ?Rx / DC Orders  ? ?ED Discharge Orders   ? ?      Ordered  ?  meloxicam (MOBIC) 15 MG tablet  Daily       ? 06/04/21 1633  ? ?  ?  ? ?  ? ?Note:  This document was prepared using Dragon voice recognition software and may include unintentional dictation errors. ?  ?Merwyn Katos, MD ?06/04/21 1640 ? ?

## 2021-06-12 ENCOUNTER — Encounter: Payer: Self-pay | Admitting: Student in an Organized Health Care Education/Training Program

## 2021-06-12 ENCOUNTER — Other Ambulatory Visit: Payer: Self-pay

## 2021-06-12 ENCOUNTER — Ambulatory Visit
Payer: BC Managed Care – PPO | Attending: Student in an Organized Health Care Education/Training Program | Admitting: Student in an Organized Health Care Education/Training Program

## 2021-06-12 VITALS — BP 158/100 | Temp 97.5°F | Resp 16 | Ht 66.0 in | Wt 243.0 lb

## 2021-06-12 DIAGNOSIS — M47816 Spondylosis without myelopathy or radiculopathy, lumbar region: Secondary | ICD-10-CM | POA: Diagnosis present

## 2021-06-12 DIAGNOSIS — E1140 Type 2 diabetes mellitus with diabetic neuropathy, unspecified: Secondary | ICD-10-CM | POA: Insufficient documentation

## 2021-06-12 DIAGNOSIS — G894 Chronic pain syndrome: Secondary | ICD-10-CM | POA: Insufficient documentation

## 2021-06-12 MED ORDER — ORPHENADRINE CITRATE 30 MG/ML IJ SOLN
30.0000 mg | Freq: Once | INTRAMUSCULAR | Status: AC
  Administered 2021-06-12: 30 mg via INTRAMUSCULAR
  Filled 2021-06-12: qty 2

## 2021-06-12 MED ORDER — KETOROLAC TROMETHAMINE 30 MG/ML IJ SOLN
30.0000 mg | Freq: Once | INTRAMUSCULAR | Status: AC
Start: 2021-06-12 — End: 2021-06-12
  Administered 2021-06-12: 30 mg via INTRAMUSCULAR
  Filled 2021-06-12: qty 1

## 2021-06-12 NOTE — Progress Notes (Signed)
Safety precautions to be maintained throughout the outpatient stay will include: orient to surroundings, keep bed in low position, maintain call bell within reach at all times, provide assistance with transfer out of bed and ambulation.  

## 2021-06-12 NOTE — Progress Notes (Addendum)
Patient: Michele [name]  Service Category: E/M  Provider: Edward JollyBilal Adasyn Mcadams, MD  ?DOB: 11/29/1965  DOS: 06/12/2021  Referring Provider: Gracelyn NurseJohnston, John D, MD  ?MRN: [id number]  Setting: Ambulatory outpatient  PCP: Gracelyn NurseJohnston, John D, MD  ?Type: New Patient  Specialty: Interventional Pain Management    ?Location: Office  Delivery: Face-to-face    ? ?Primary Reason(s) for Visit: Encounter for initial evaluation of one or more chronic problems (new to examiner) potentially causing chronic pain, and posing a threat to normal musculoskeletal function. (Level of risk: High) ?CC: Back Pain (Lumbar bilateral ) and Migraine ? ?HPI  ?Michele [name] is a 56 y.o. year old, female patient, who comes for the first time to our practice referred by Gracelyn NurseJohnston, John D, MD for our initial evaluation of her chronic pain. She has Neck pain; Migraines; Obesity, morbid, BMI 40.0-49.9 (HCC); Diabetes mellitus type 2, uncomplicated (HCC); Bilateral occipital neuralgia; Lumbar spondylosis; Chronic bilateral low back pain without sciatica; and Chronic pain syndrome on their problem list. Today she comes in for evaluation of her Back Pain (Lumbar bilateral ) and Migraine ? ?Pain Assessment: ?Location: Lower, Left, Right Back ?Radiating: into both hips and legs to about mid thigh to knees ?Onset: More than a month ago ?Duration: Chronic pain ?Quality: Discomfort, Constant, Sharp ?Severity: 10-Worst pain ever/10 (subjective, self-reported pain score)  ?Effect on ADL: affects her work, works 12 hour night shifts. ?Timing: Constant ?Modifying factors: heating pad, ?BP: (!) 158/100  HR:   ? ?Onset and Duration: Gradual ?Cause of pain: Unknown ?Severity: Getting worse, No change since onset, NAS-11 at its worse: 10/10, NAS-11 at its best: 10/10, NAS-11 now: 10/10, and NAS-11 on the average: 10/10 ?Timing: Not influenced by the time of the day, During activity or exercise, and After activity or exercise ?Aggravating Factors: Bending, Climbing, Kneeling,  Prolonged sitting, Squatting, Stooping , Twisting, Walking, Walking uphill, Walking downhill, and Working ?Alleviating Factors: Lying down, Medications, and heat ?Associated Problems: Spasms, Tingling, and Pain that does not allow patient to sleep ?Quality of Pain: Agonizing, Disabling, and Uncomfortable ?Previous Examinations or Tests: Nerve block and X-rays ?Previous Treatments: Epidural steroid injections, Facet blocks, and Steroid treatments by mouth ? ?Michele [name] is a pleasant 56 year old female who presents with a chief complaint of low back pain related to lumbar facet arthropathy and lumbar spondylosis.  She previously saw me in 2020 where she received 2 sets of diagnostic lumbar facet medial branch nerve blocks, both of which provided her with greater than 75% pain relief for 2 weeks.  Our plan at that time was to proceed with a lumbar radiofrequency ablation for the purpose of obtaining longer-term pain relief however the patient was lost to follow-up due to insurance issues and personal issues.  She has reestablished with [facility] [employer] and is hoping to continue with pain management care and obtain lumbar radiofrequency ablation as that is where we left off in her treatment plan.  Of note she has had visits to the emergency department for increased axial low back pain related to lumbar facet arthropathy and DDD.  Of note her diagnostic lumbar facet medial branch nerve blocks were performed on 02/19/2018 and 04/02/2018 and provided her with greater than 75% pain relief for approximately 2 weeks. ? ?Patient also endorses paresthesias of bilateral feet related to diabetic neuropathy.  She is currently on insulin.  We also discussed Qutenza for painful diabetic neuropathy of bilateral feet.  Risks and benefits reviewed and patient would like to proceed. ? ?She continues  on amitriptyline 50 mg nightly, Topamax as well.  She has discontinued Flexeril and meloxicam due to lack of response. ? ?Do not recommend  chronic opioid therapy for her condition.  She has worked with physical therapy in the past. ? ?Meds  ? ?Current Outpatient Medications:  ?  amitriptyline (ELAVIL) 50 MG tablet, Take 50 mg by mouth at bedtime., Disp: , Rfl:  ?  atorvastatin (LIPITOR) 20 MG tablet, Take 20 mg by mouth daily at 6 PM., Disp: , Rfl:  ?  glipiZIDE (GLUCOTROL) 5 MG tablet, Take 5 mg by mouth daily before breakfast., Disp: , Rfl:  ?  insulin glargine (LANTUS) 100 UNIT/ML injection, Inject 18 Units into the skin at bedtime. , Disp: , Rfl:  ?  SEMGLEE, YFGN, 100 UNIT/ML Pen, Inject 22 Units into the skin at bedtime., Disp: , Rfl:  ?  topiramate (TOPAMAX) 100 MG tablet, Take 100 mg by mouth 2 (two) times daily., Disp: , Rfl:  ?  triamterene-hydrochlorothiazide (MAXZIDE-25) 37.5-25 MG per tablet, Take 1 tablet by mouth daily., Disp: , Rfl:  ?  verapamil (COVERA HS) 180 MG (CO) 24 hr tablet, Take 180 mg by mouth at bedtime., Disp: , Rfl:  ?  pantoprazole (PROTONIX) 20 MG tablet, Take 1 tablet (20 mg total) by mouth daily., Disp: 30 tablet, Rfl: 1 ?  sucralfate (CARAFATE) 1 g tablet, Take 1 tablet (1 g total) by mouth 4 (four) times daily for 15 days., Disp: 60 tablet, Rfl: 0 ? ?Imaging Review  ?Cervical Imaging: ?Cervical MR wo contrast: Results for orders placed during the hospital encounter of 06/01/19 ? ?MR CERVICAL SPINE WO CONTRAST ? ?Narrative ?CLINICAL DATA:  Initial evaluation for right upper extremity pain ?with right hand weakness. ? ?EXAM: ?MRI CERVICAL SPINE WITHOUT CONTRAST ? ?TECHNIQUE: ?Multiplanar, multisequence MR imaging of the cervical spine was ?performed. No intravenous contrast was administered. ? ?COMPARISON:  None available. ? ?FINDINGS: ?Alignment: Straightening with smooth reversal of the normal cervical ?lordosis. No listhesis. ? ?Vertebrae: Vertebral body height maintained without evidence for ?acute or chronic fracture. Bone marrow signal intensity within ?normal limits. No discrete or worrisome osseous lesions. No  abnormal ?marrow edema. ? ?Cord: Signal intensity within the cervical spinal cord is normal. ?Normal cord caliber morphology. ? ?Posterior Fossa, vertebral arteries, paraspinal tissues: Visualized ?brain and posterior fossa within normal limits. Craniocervical ?junction normal. Paraspinous and prevertebral soft tissues within ?normal limits. Normal intravascular flow voids seen within the ?vertebral arteries bilaterally. ? ?Disc levels: ? ?No significant disc pathology seen within the cervical spine. Disc ?space height maintained. No disc bulge or focal disc herniation. No ?significant facet disease. No canal or neural foraminal stenosis. No ?impingement. ? ?IMPRESSION: ?1. Straightening with smooth reversal of the normal cervical ?lordosis. ?2. Otherwise unremarkable and normal MRI of the cervical spine. No ?significant disc pathology, stenosis, or neural impingement. No ?findings to explain right upper extremity symptoms identified. ? ? ?Electronically Signed ?By: Rise Mu M.D. ?Narrative ?CLINICAL DATA:  One day history of severe dorsalgia ? ?EXAM: ?THORACIC SPINE - 3 VIEW ? ?COMPARISON:  None. ? ?FINDINGS: ?Frontal, lateral, and swimmer's views obtained. No fracture or ?spondylolisthesis. Disc spaces appear intact. No erosive change. ? ?IMPRESSION: ?No fracture or spondylolisthesis.  No appreciable arthropathy. ? ? ?Electronically Signed ?By: Bretta Bang III M.D. ?On: 08/19/2014 16:27 ? ? ? ?MR LUMBAR SPINE WO CONTRAST ? ?Narrative ?CLINICAL DATA:  Low back, right hip, and leg pain for 2 days. ? ?EXAM: ?MRI LUMBAR SPINE WITHOUT CONTRAST ? ?TECHNIQUE: ?  Multiplanar, multisequence MR imaging of the lumbar spine was ?performed. No intravenous contrast was administered. ? ?COMPARISON:  Lumbar spine radiographs 12/16/2017 ? ?FINDINGS: ?Segmentation:  Standard. ? ?Alignment:  Normal. ? ?Vertebrae: No fracture or suspicious osseous lesion. Mild ?periarticular edema associated with the right L4-5 facet  joint. ? ?Conus medullaris and cauda equina: Conus extends to the L1 level. ?Conus and cauda equina appear normal. ? ?Paraspinal and other soft tissues: Unremarkable. ? ?Disc levels: ? ?T12-L1 through L2-3: Ne

## 2021-06-19 ENCOUNTER — Telehealth: Payer: Self-pay

## 2021-06-19 DIAGNOSIS — M47816 Spondylosis without myelopathy or radiculopathy, lumbar region: Secondary | ICD-10-CM

## 2021-06-19 NOTE — Telephone Encounter (Signed)
The RFA's were denied but the Qutenza doesn't require prior auth. Do you want me to schedule for the Qunenza?  ?

## 2021-06-27 NOTE — Addendum Note (Signed)
Addended by: Edward Jolly on: 06/27/2021 11:04 AM ? ? Modules accepted: Orders ? ?

## 2021-07-03 ENCOUNTER — Ambulatory Visit
Payer: BC Managed Care – PPO | Attending: Student in an Organized Health Care Education/Training Program | Admitting: Student in an Organized Health Care Education/Training Program

## 2021-07-03 ENCOUNTER — Encounter: Payer: Self-pay | Admitting: Student in an Organized Health Care Education/Training Program

## 2021-07-03 VITALS — BP 144/89 | HR 82 | Temp 97.6°F | Resp 18 | Ht 66.0 in | Wt 244.0 lb

## 2021-07-03 DIAGNOSIS — E1140 Type 2 diabetes mellitus with diabetic neuropathy, unspecified: Secondary | ICD-10-CM | POA: Diagnosis not present

## 2021-07-03 DIAGNOSIS — G894 Chronic pain syndrome: Secondary | ICD-10-CM | POA: Diagnosis not present

## 2021-07-03 MED ORDER — CAPSAICIN-CLEANSING GEL 8 % EX KIT
4.0000 | PACK | Freq: Once | CUTANEOUS | Status: AC
  Administered 2021-07-03: 4 via TOPICAL

## 2021-07-03 MED ORDER — DICLOFENAC SODIUM 75 MG PO TBEC: 75.0000 mg | DELAYED_RELEASE_TABLET | Freq: Two times a day (BID) | ORAL | 0 refills | Status: AC

## 2021-07-03 MED ORDER — TIZANIDINE HCL 4 MG PO TABS: 4.0000 mg | ORAL_TABLET | Freq: Every evening | ORAL | 0 refills | Status: AC | PRN

## 2021-07-03 NOTE — Progress Notes (Signed)
PROVIDER NOTE: Interpretation of information contained herein should be left to medically-trained personnel. Specific patient instructions are provided elsewhere under "Patient Instructions" section of medical record. This document was created in part using STT-dictation technology, any transcriptional errors that may result from this process are unintentional.  ?Patient: Michele [name] ?Type: Established ?DOB: [birth year] ?MRN: [id number] ?PCP: Gracelyn NurseJohnston, John D, MD  Service: Procedure ?DOS: 07/03/2021 ?Setting: Ambulatory ?Location: Ambulatory outpatient facility ?Delivery: Face-to-face Provider: Edward JollyBilal Cayne Yom, MD ?Specialty: Interventional Pain Management ?Specialty designation: 09 ?Location: Outpatient facility ?Ref. Prov.: Gracelyn NurseJohnston, John D, MD   ? ?Primary Reason for Visit: Interventional Pain Management Treatment. ?CC: Back Pain (low) ? ?  ?Procedure:            ?Bilateral Qutenza neurolysis for painful diabetic neuropathy of bilateral feet    ? ?1. Chronic painful diabetic neuropathy (HCC)   ?2. Chronic pain syndrome   ? ?NAS-11 Pain score:  ? Pre-procedure: 8 /10  ? Post-procedure: 8 /10  ? ?  ?Pre-op H&P Assessment:  ?Ms. Michele [name] is a 56 y.o. (year old), female patient, seen today for interventional treatment. She  has a past surgical history that includes Cesarean section; Knee surgery; Ankle surgery; Abdominal hysterectomy; and Wrist surgery. Ms. Michele [name] has a current medication list which includes the following prescription(s): amitriptyline, atorvastatin, diclofenac, glipizide, insulin glargine, pantoprazole, semglee (yfgn), sucralfate, tizanidine, topiramate, triamterene-hydrochlorothiazide, and verapamil. Her primarily concern today is the Back Pain (low) ? ?Initial Vital Signs:  ?Pulse/HCG Rate: 82  ?Temp: 97.6 ?F (36.4 ?C) ?Resp: 18 ?BP: (!) 144/89 ?SpO2: 100 % ? ?BMI: Estimated body mass index is 39.38 kg/m? as calculated from the following: ?  Height as of this encounter: 5\' 6"  (1.676 m). ?  Weight as  of this encounter: 244 lb (110.7 kg). ? ?Risk Assessment: ?Allergies: Reviewed. She has No Known Allergies.  ?Allergy Precautions: None required ?Coagulopathies: Reviewed. None identified.  ?Blood-thinner therapy: None at this time ?Active Infection(s): Reviewed. None identified. Ms. Michele [name] is afebrile ? ?Site Confirmation: Ms. Michele [name] was asked to confirm the procedure and laterality before marking the site ?Procedure checklist: Completed ?Consent: Before the procedure and under the influence of no sedative(s), amnesic(s), or anxiolytics, the patient was informed of the treatment options, risks and possible complications. To fulfill our ethical and legal obligations, as recommended by the American Medical Association's Code of Ethics, I have informed the patient of my clinical impression; the nature and purpose of the treatment or procedure; the risks, benefits, and possible complications of the intervention; the alternatives, including doing nothing; the risk(s) and benefit(s) of the alternative treatment(s) or procedure(s); and the risk(s) and benefit(s) of doing nothing. ?The patient was provided information about the general risks and possible complications associated with the procedure. These may include, but are not limited to: failure to achieve desired goals, infection, bleeding, organ or nerve damage, allergic reactions, paralysis, and death. ?In addition, the patient was informed of those risks and complications associated to the procedure, such as failure to decrease pain; infection; bleeding; organ or nerve damage with subsequent damage to sensory, motor, and/or autonomic systems, resulting in permanent pain, numbness, and/or weakness of one or several areas of the body; allergic reactions; (i.e.: anaphylactic reaction); and/or death. ?Furthermore, the patient was informed of those risks and complications associated with the medications. These include, but are not limited to: allergic reactions (i.e.:  anaphylactic or anaphylactoid reaction(s)); adrenal axis suppression; blood sugar elevation that in diabetics may result in ketoacidosis or comma; water retention that in patients  with history of congestive heart failure may result in shortness of breath, pulmonary edema, and decompensation with resultant heart failure; weight gain; swelling or edema; medication-induced neural toxicity; particulate matter embolism and blood vessel occlusion with resultant organ, and/or nervous system infarction; and/or aseptic necrosis of one or more joints. ?Finally, the patient was informed that Medicine is not an exact science; therefore, there is also the possibility of unforeseen or unpredictable risks and/or possible complications that may result in a catastrophic outcome. The patient indicated having understood very clearly. We have given the patient no guarantees and we have made no promises. Enough time was given to the patient to ask questions, all of which were answered to the patient's satisfaction. Michele [name] has indicated that she wanted to continue with the procedure. ?Attestation: I, the ordering provider, attest that I have discussed with the patient the benefits, risks, side-effects, alternatives, likelihood of achieving goals, and potential problems during recovery for the procedure that I have provided informed consent. ?Date  Time: 07/03/2021  9:36 AM ? ?Pre-Procedure Preparation:  ?Monitoring: As per clinic protocol. Respiration, ETCO2, SpO2, BP, heart rate and rhythm monitor placed and checked for adequate function ?Safety Precautions: Patient was assessed for positional comfort and pressure points before starting the procedure. ?Time-out: I initiated and conducted the "Time-out" before starting the procedure, as per protocol. The patient was asked to participate by confirming the accuracy of the "Time Out" information. Verification of the correct person, site, and procedure were performed and confirmed by me,  the nursing staff, and the patient. "Time-out" conducted as per Joint Commission's Universal Protocol (UP.01.01.01). ?Time:   ? ?Description of Procedure:          ? ?Area Prepped: Entire foot Region ?DuraPrep (Iodine Povacrylex [0.7% available iodine] and Isopropyl Alcohol, 74% w/w) ? ? ?2 Qutenza patches applied to plantar surface of left foot ?2 Qutenza patches applied to plantar surface of right foot ? ?   ?    ?      ? ?     ? ? ? ? ? ?Vitals:  ? 07/03/21 0944  ?BP: (!) 144/89  ?Pulse: 82  ?Resp: 18  ?Temp: 97.6 ?F (36.4 ?C)  ?SpO2: 100%  ?Weight: 244 lb (110.7 kg)  ?Height: 5\' 6"  (1.676 m)  ?  ?Start Time:   hrs. ?End Time:   hrs. ? ? ?Post-operative Assessment:  ?Post-procedure Vital Signs:  ?Pulse/HCG Rate: 82  ?Temp: 97.6 ?F (36.4 ?C) ?Resp: 18 ?BP: (!) 144/89 ?SpO2: 100 % ? ?EBL: None ? ?Complications: No immediate post-treatment complications observed by team, or reported by patient. ? ?Note: The patient tolerated the entire procedure well. A repeat set of vitals were taken after the procedure and the patient was kept under observation following institutional policy, for this type of procedure. Post-procedural neurological assessment was performed, showing return to baseline, prior to discharge. The patient was provided with post-procedure discharge instructions, including a section on how to identify potential problems. Should any problems arise concerning this procedure, the patient was given instructions to immediately contact , at any time, without hesitation. In any case, we plan to contact the patient by telephone for a follow-up status report regarding this interventional procedure. ? ?Comments:  No additional relevant information. ? ?Plan of Care  ?Orders:  ?Orders Placed This Encounter  ?Procedures  ? Compliance Drug Analysis, Ur  ?  Volume: 30 ml(s). Minimum 3 ml of urine is needed. ?Document temperature of fresh sample. ?Indications: Long term (current) use of  opiate analgesic  [phone]) ?Test#: [id number] (Comprehensive Profile)  ?  Order Specific Question:   Release to patient  ?  Answer:   Immediate  ? Ambulatory referral to Psychology  ?  Referral Priority:   Routine  ?  Referral Type:   Psych

## 2021-07-03 NOTE — Patient Instructions (Signed)

## 2021-07-03 NOTE — Progress Notes (Signed)
Safety precautions to be maintained throughout the outpatient stay will include: orient to surroundings, keep bed in low position, maintain call bell within reach at all times, provide assistance with transfer out of bed and ambulation.  

## 2021-07-04 ENCOUNTER — Telehealth: Payer: Self-pay

## 2021-07-04 NOTE — Telephone Encounter (Signed)
Post procedure phone call. Patient states she is doing good.  

## 2021-07-09 LAB — COMPLIANCE DRUG ANALYSIS, UR

## 2021-07-10 ENCOUNTER — Telehealth: Payer: Self-pay | Admitting: Student in an Organized Health Care Education/Training Program

## 2021-07-10 NOTE — Telephone Encounter (Signed)
Pt  stated that she was in a lot of pain. Pt wants something for pain .  Also. Pt would like medication and to see what happen to her referral to physical therapy. Please call Pt with an update.  ?

## 2021-07-26 ENCOUNTER — Ambulatory Visit: Payer: BC Managed Care – PPO | Attending: Student in an Organized Health Care Education/Training Program

## 2021-07-26 DIAGNOSIS — M5459 Other low back pain: Secondary | ICD-10-CM | POA: Insufficient documentation

## 2021-07-26 DIAGNOSIS — M6281 Muscle weakness (generalized): Secondary | ICD-10-CM | POA: Diagnosis present

## 2021-07-26 DIAGNOSIS — M47816 Spondylosis without myelopathy or radiculopathy, lumbar region: Secondary | ICD-10-CM | POA: Insufficient documentation

## 2021-07-26 NOTE — Therapy (Signed)
[place] [facility] PH[facility] MEDICINE [address] [place], [address] Phone: [phone]   Fax:  [phone]  Physical Therapy Evaluation  Patient Details  Name: Michele [name] MRN: [id number] Date of Birth: [birth year] No data recorded  Encounter Date: 07/26/2021   PT End of Session - 07/26/21 1006     Visit Number 1    Number of Visits 17    Date for PT Re-Evaluation 09/20/21    PT Start Time 0825    PT Stop Time 0916    PT Time Calculation (min) 51 min    Activity Tolerance Patient limited by pain    Behavior During Therapy [facility] for tasks assessed/performed             Past Medical History:  Diagnosis Date   Diabetes mellitus without complication (HCC)    High cholesterol    Hypertension    Migraine headache    Migraines     Past Surgical History:  Procedure Laterality Date   ABDOMINAL HYSTERECTOMY     ANKLE SURGERY     CESAREAN SECTION     KNEE SURGERY     WRIST SURGERY      There were no vitals filed for this visit.    Subjective Assessment - 07/26/21 0822     Subjective Pt is a 56 y.o female referred to PT for chronic LBP. PMH includes: diabetic neuropathy, DM type 2, Chronic pain syndrome, Obesity.    Pertinent History Pt is a 56 y.o female referred to PT for chronic LBP. PMH includes: diabetic neuropathy, DM type 2, Chronic pain syndrome, Obesity. Pt's LBP has occurred for about 3-4 years ago. Pt reports she was receiving injections in her low back with relief but lost her job. She recently returned back to Dr. Cherylann Ratel to begin treatment again however insurance requesting PT prior to injections. Pt's LBP has begun insidiously but has gotten worse. Typically started on the R side then progressed to bilateral. R > L. Pt does report referred pain from low back to B hips down to knees. Does endorse pain so bad that she looses her balance. Pt describes pain as constant throb and ache that is grabbing in nature. Pt  has not had any relief except from shot and muscle relaxers from ED visits in the past. Pt unable to lay on her R side, has to lay on her L side and stomach. 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 Pt reports a lot of walking and standing required for her job on concrete floors. Does get aggravation from her job tasks. Pain is worsened with job tasks, turning, but no specific pattern or movements cause it. Does have periods where she is pain free. Pt's goal is to manage her pain.    Patient Stated Goals Manage her pain    Currently in Pain? Yes    Pain Score 9     Pain Location Back    Pain Orientation Right    Pain Descriptors / Indicators Cramping;Discomfort;Dull    Pain Type Chronic pain    Pain Onset More than a month ago    Pain Frequency Constant              OBJECTIVE  Mental Status Patient's fund of knowledge is within normal limits for educational level.   Gross Musculoskeletal Assessment Tremor: None Bulk: Normal Tone: Normal No visible step-off along spinal column   Gait  Antalgic gait on RLE   Posture Lumbar lordosis: Decreased lumbar lordosis  Iliac crest height: equal bilaterally    AROM (degrees) R/L (all movements include overpressure unless otherwise stated) Lumbar forward flexion (65): limited 50% Lumbar extension (30): Limited by 75% Lumbar lateral flexion (25): R: Limited 50% L: Limited 50% Thoracic and Lumbar rotation (30 degrees):  WNL to R and L Hip IR (0-45): Limited bilat Hip ER (0-45): WNL R and L Hip Flexion (0-125): Limited to under 90 deg bilar  *Indicates pain     Strength (out of 5) R/L 3+*/4 Hip flexion 3*/5 Hip ER 3*/5 Hip IR 4/4 Hip abduction 5/5 Hip adduction 5/5 Hip extension 5/5 Knee extension 4*/5 Knee flexion 5/5 Ankle dorsiflexion 5/5 Ankle plantarflexion    Sensation Grossly intact to light touch bilateral LEs as determined by testing dermatomes L2-S2. Decreased light  touch noted on R L4 and S1 which pt reports due to R ankle fracture in the past. Proprioception and hot/cold testing deferred on this date.    Palpation Graded on 0-4 scale (0 = no pain, 1 = pain, 2 = pain with wincing/grimacing/flinching, 3 = pain with withdrawal, 4 = unwilling to allow palpation)  Location LEFT  RIGHT           Lumbar paraspinals 3 3  Quadratus Lumborum 0 0  Gluteus Maximus 3 3  Gluteus Medius 3 3  Deep hip external rotators 0 0  PSIS 2 2  Fortin's Area (SIJ) 0 0  Greater Trochanter 0 0     Muscle Length [place]: Positive bilat    Passive Accessory Intervertebral Motion (PAIVM) Deferred due to hyperalgesia with light touch and palpation    SPECIAL TESTS Lumbar Radiculopathy and Discogenic: Centralization and Peripheralization (SN 92, -LR 0.12): Not done Slump (SN 83, -LR 0.32): R: Negative L: Negative SLR (SN 92, -LR 0.29): R: Negative L:  Negative Crossed SLR (SP 90): R: Positive L: Positive    Hip: FABER (SN 81): R: Positive L: Positive FADIR (SN 94): R: Positive L: Positive     Objective measurements completed on examination: See above findings.    PT Education - 07/26/21 1610     Education Details PT POC    Person(s) Educated Patient    Methods Explanation    Comprehension Verbalized understanding              PT Short Term Goals - 07/26/21 1134       PT SHORT TERM GOAL #1   Title Pt will be independent with HEP to improve pain and hip/lumbar mobility.    Baseline 07/26/21: initiate next session.    Time 4    Period Weeks    Status New    Target Date 08/23/21               PT Long Term Goals - 07/26/21 1235       PT LONG TERM GOAL #1   Title Pt will improve FOTO score to target to demonstrate clinically significant improvement in functional mobility.    Baseline 07/26/21: 42 with target of 66    Time 8    Period Weeks    Status New    Target Date 09/20/21      PT LONG TERM GOAL #2   Title Pt will improve 5xSTS by  at least 3 sec to demonstrate clinically significant improvement in LE strength    Baseline 07/26/21: deferred to next session    Time 8    Period  Weeks    Status New    Target Date 09/20/21      PT LONG TERM GOAL #3   Title Pt will report ability to work 8 hour shift with < 7/10 pain NPS demonstrating improved tolerance for standing, walking and rotation work related tasks.    Baseline 07/26/21: 9-10/10 NPS    Time 8    Period Weeks    Status New    Target Date 09/20/21                    Plan - 07/26/21 1007     Clinical Impression Statement Pt is a pleasant 56 y.o. female referred to OPPT for B LBP that radiates into hips and anterior thighs. Pt presenting with globally limited lumbar and hip AROM and muscular weakness proximal > distal via MMT. Most pain with returning to extension from flexed posture and L lateral flexion. Positive FABER and FADDIR bilat but R > L with regards to pain. Pt globally intact sensation to LT in LE's however limited in R foot due to prior foot fracture. Pt overall hypersensitive and painful to light touch in bilat glutes,sacrum, along central spine, and B paraspinals from S1 to T12. Unable to asess joint mobility due to hyperalgesia. These impairments are limiting pt from performing standing and walking ADL's and job tasks without significant pain. Pt will benefit from skilled PT services to address pain, hip/lumbar limitations to attempt to improve function with ADL's and job related tasks.    Personal Factors and Comorbidities Age;Comorbidity 3+;Past/Current Experience;Time since onset of injury/illness/exacerbation    Comorbidities diabetic neuropathy, DM type 2, Chronic pain syndrome, Obesity.    Stability/Clinical Decision Making Evolving/Moderate complexity    Clinical Decision Making Moderate    Rehab Potential Fair    PT Frequency 2x / week    PT Duration 8 weeks    PT Treatment/Interventions ADLs/Self Care Home Management    PT Next Visit  Plan Assess core strength and 5xSTS. Develop gentle hip/lumbar mobility HEP.    PT Home Exercise Plan next session    Consulted and Agree with Plan of Care Patient             Patient will benefit from skilled therapeutic intervention in order to improve the following deficits and impairments:     Visit Diagnosis: Other low back pain  Muscle weakness (generalized)     Problem List Patient Active Problem List   Diagnosis Date Noted   Lumbar spondylosis 03/21/2018   Chronic bilateral low back pain without sciatica 03/21/2018   Chronic pain syndrome 03/21/2018   Obesity, morbid, BMI 40.0-49.9 (HCC) 07/12/2017   Bilateral occipital neuralgia 09/14/2016   Neck pain 12/16/2014   Migraines 04/28/2014   Diabetes mellitus type 2, uncomplicated (HCC) 04/28/2014    Delphia GratesMilton M. Fairly IV, PT, DPT Physical Therapist- [place]  [facility]  07/26/2021, 12:57 PM  [facility] [address] [place], [place], [address] Phone: [phone]   Fax:  [phone]  Name: Michele [name] MRN: [id number] Date of Birth: 08-06-1965

## 2021-08-14 ENCOUNTER — Encounter: Payer: Self-pay | Admitting: Student in an Organized Health Care Education/Training Program

## 2021-08-14 ENCOUNTER — Ambulatory Visit
Payer: BC Managed Care – PPO | Attending: Student in an Organized Health Care Education/Training Program | Admitting: Student in an Organized Health Care Education/Training Program

## 2021-08-14 DIAGNOSIS — E1140 Type 2 diabetes mellitus with diabetic neuropathy, unspecified: Secondary | ICD-10-CM

## 2021-08-14 DIAGNOSIS — M47816 Spondylosis without myelopathy or radiculopathy, lumbar region: Secondary | ICD-10-CM

## 2021-08-14 DIAGNOSIS — G894 Chronic pain syndrome: Secondary | ICD-10-CM

## 2021-08-14 NOTE — Progress Notes (Signed)
I attempted to call the patient however no response. Voicemail left instructing patient to call front desk office at [phone] to reschedule appointment. -Dr Talor Desrosiers  

## 2021-08-16 ENCOUNTER — Ambulatory Visit
Payer: BC Managed Care – PPO | Attending: Student in an Organized Health Care Education/Training Program | Admitting: Physical Therapy

## 2021-08-21 ENCOUNTER — Encounter: Payer: BC Managed Care – PPO | Admitting: Physical Therapy

## 2021-08-21 ENCOUNTER — Ambulatory Visit: Payer: BC Managed Care – PPO | Admitting: Physical Therapy

## 2021-08-22 ENCOUNTER — Encounter: Payer: BC Managed Care – PPO | Admitting: Physical Therapy

## 2021-08-23 ENCOUNTER — Ambulatory Visit: Payer: BC Managed Care – PPO | Admitting: Physical Therapy

## 2021-08-24 ENCOUNTER — Encounter: Payer: BC Managed Care – PPO | Admitting: Physical Therapy

## 2021-08-28 ENCOUNTER — Encounter: Payer: BC Managed Care – PPO | Admitting: Physical Therapy

## 2021-10-11 ENCOUNTER — Telehealth: Payer: Self-pay | Admitting: Student in an Organized Health Care Education/Training Program

## 2021-10-11 NOTE — Telephone Encounter (Signed)
PT Stated that she need an med refilled. Also patient wanted to make doctor aware that she went to PT once but hasn't heard anything back about future appts. Please give patient a call. Thanks

## 2021-10-11 NOTE — Telephone Encounter (Signed)
She missed her follow-up appt following her procedure. Last appt 07/2021. Needs appt.

## 2021-10-11 NOTE — Telephone Encounter (Signed)
Pt stated that she need an med refilled and also patient stated that she went to PT. Haven't heard anything from PT for future appts. Please give patient a call. Thanks

## 2021-10-17 ENCOUNTER — Encounter: Payer: BC Managed Care – PPO | Admitting: Student in an Organized Health Care Education/Training Program

## 2021-10-25 ENCOUNTER — Telehealth: Payer: Self-pay

## 2021-10-25 NOTE — Telephone Encounter (Signed)
Patient states she had wanted to come today, but is now getting ready to go to work. Advised her to discuss with Dr. Cherylann Ratel at her appt yesterday.

## 2021-10-25 NOTE — Telephone Encounter (Signed)
She called asking if she could get a shot for her SI pain. She says Dr. Cherylann Ratel told her she could come and get a shot whenever she wanted but I cant schedule without an order. She is coming in tomorrow for a med refill.

## 2021-10-26 ENCOUNTER — Other Ambulatory Visit: Payer: Self-pay

## 2021-10-26 ENCOUNTER — Ambulatory Visit
Payer: BC Managed Care – PPO | Attending: Student in an Organized Health Care Education/Training Program | Admitting: Student in an Organized Health Care Education/Training Program

## 2021-10-26 ENCOUNTER — Encounter: Payer: Self-pay | Admitting: Student in an Organized Health Care Education/Training Program

## 2021-10-26 VITALS — BP 141/92 | HR 75 | Temp 97.1°F | Resp 18 | Ht 66.0 in | Wt 245.0 lb

## 2021-10-26 DIAGNOSIS — M5416 Radiculopathy, lumbar region: Secondary | ICD-10-CM | POA: Insufficient documentation

## 2021-10-26 DIAGNOSIS — G894 Chronic pain syndrome: Secondary | ICD-10-CM | POA: Diagnosis not present

## 2021-10-26 DIAGNOSIS — M47816 Spondylosis without myelopathy or radiculopathy, lumbar region: Secondary | ICD-10-CM | POA: Insufficient documentation

## 2021-10-26 DIAGNOSIS — M5441 Lumbago with sciatica, right side: Secondary | ICD-10-CM | POA: Insufficient documentation

## 2021-10-26 DIAGNOSIS — E1140 Type 2 diabetes mellitus with diabetic neuropathy, unspecified: Secondary | ICD-10-CM | POA: Diagnosis not present

## 2021-10-26 DIAGNOSIS — G8929 Other chronic pain: Secondary | ICD-10-CM | POA: Insufficient documentation

## 2021-10-26 MED ORDER — KETOROLAC TROMETHAMINE 30 MG/ML IJ SOLN
30.0000 mg | Freq: Once | INTRAMUSCULAR | Status: AC
  Administered 2021-10-26: 30 mg via INTRAMUSCULAR
  Filled 2021-10-26: qty 1

## 2021-10-26 MED ORDER — TIZANIDINE HCL 4 MG PO TABS: 4.0000 mg | ORAL_TABLET | Freq: Every evening | ORAL | 0 refills | Status: AC | PRN

## 2021-10-26 MED ORDER — PREGABALIN 50 MG PO CAPS: ORAL_CAPSULE | ORAL | 0 refills | Status: DC

## 2021-10-26 NOTE — Progress Notes (Signed)
Safety precautions to be maintained throughout the outpatient stay will include: orient to surroundings, keep bed in low position, maintain call bell within reach at all times, provide assistance with transfer out of bed and ambulation.  

## 2021-10-26 NOTE — Patient Instructions (Addendum)
______________________________________________________________________  Preparing for Procedure with Sedation  NOTICE: Due to recent regulatory changes, starting on October 03, 2020, procedures requiring intravenous (IV) sedation will no longer be performed at the [facility].  These types of procedures are required to be performed at [facility] ambulatory surgery facility.  We are very sorry for the inconvenience.  Procedure appointments are limited to planned procedures: No Prescription Refills. No disability issues will be discussed. No medication changes will be discussed.  Instructions: Oral Intake: Do not eat or drink anything for at least 8 hours prior to your procedure. (Exception: Blood Pressure Medication. See below.) Transportation: A driver is required. You may not drive yourself after the procedure. Blood Pressure Medicine: Do not forget to take your blood pressure medicine with a sip of water the morning of the procedure. If your Diastolic (lower reading) is above 100 mmHg, elective cases will be cancelled/rescheduled. Blood thinners: These will need to be stopped for procedures. Notify our staff if you are taking any blood thinners. Depending on which one you take, there will be specific instructions on how and when to stop it. Diabetics on insulin: Notify the staff so that you can be scheduled 1st case in the morning. If your diabetes requires high dose insulin, take only  of your normal insulin dose the morning of the procedure and notify the staff that you have done so. Preventing infections: Shower with an antibacterial soap the morning of your procedure. Build-up your immune system: Take 1000 mg of Vitamin C with every meal (3 times a day) the day prior to your procedure. Antibiotics: Inform the staff if you have a condition or reason that requires you to take antibiotics before dental procedures. Pregnancy: If you are pregnant, call and cancel the procedure. Sickness: If  you have a cold, fever, or any active infections, call and cancel the procedure. Arrival: You must be in the facility at least 30 minutes prior to your scheduled procedure. Children: Do not bring children with you. Dress appropriately: There is always the possibility that your clothing may get soiled. Valuables: Do not bring any jewelry or valuables.  Reasons to call and reschedule or cancel your procedure: (Following these recommendations will minimize the risk of a serious complication.) Surgeries: Avoid having procedures within 2 weeks of any surgery. (Avoid for 2 weeks before or after any surgery). Flu Shots: Avoid having procedures within 2 weeks of a flu shots. (Avoid for 2 weeks before or after immunizations). Barium: Avoid having a procedure within 7-10 days after having had a radiological study involving the use of radiological contrast. (Myelograms, Barium swallow or enema study). Heart attacks: Avoid any elective procedures or surgeries for the initial 6 months after a "Myocardial Infarction" (Heart Attack). Blood thinners: It is imperative that you stop these medications before procedures. Let us know if you if you take any blood thinner.  Infection: Avoid procedures during or within two weeks of an infection (including chest colds or gastrointestinal problems). Symptoms associated with infections include: Localized redness, fever, chills, night sweats or profuse sweating, burning sensation when voiding, cough, congestion, stuffiness, runny nose, sore throat, diarrhea, nausea, vomiting, cold or Flu symptoms, recent or current infections. It is specially important if the infection is over the area that we intend to treat. Heart and lung problems: Symptoms that may suggest an active cardiopulmonary problem include: cough, chest pain, breathing difficulties or shortness of breath, dizziness, ankle swelling, uncontrolled high or unusually low blood pressure, and/or palpitations. If you are    experiencing any of these symptoms, cancel your procedure and contact your primary care physician for an evaluation.  Remember:  Regular Business hours are:  Monday to Thursday 8:00 AM to 4:00 PM  Provider's Schedule: Francisco Naveira, MD:  Procedure days: Tuesday and Thursday 7:30 AM to 4:00 PM  Bilal Lateef, MD:  Procedure days: Monday and Wednesday 7:30 AM to 4:00 PM ______________________________________________________________________  Epidural Steroid Injection  An epidural steroid injection is a shot of steroid medicine, also called cortisone, and a numbing medicine that is given into the epidural space. This space is between the spinal cord and the bones of the back. This shot helps relieve pain caused by an irritated or swollen nerve root. The amount of pain relief you get from the injection depends on what is causing the nerve to be swollen and irritated, and how long your pain lasts. You may have a period of slightly more pain after your injection, before the steroid medicine takes effect. This medicine usually starts working within 1-3 days. In some cases, you might need 7-10 days to feel the full effect. Tell your health care provider about: Any allergies you have. All medicines you are taking, including vitamins, herbs, eye drops, creams, and over-the-counter medicines. Any problems you or family members have had with anesthetic medicines. Any bleeding problems you have. Any surgeries you have had. Any medical conditions you have. Whether you are pregnant or may be pregnant. What are the risks? Your health care provider will talk with you about risks. These may include: Headache. Bleeding. Infection. Allergic reaction to medicines or dyes. Nerve damage. Not being able to move (paralysis). This is rare. What happens before the procedure? Medicines You may be given medicines to lower anxiety. Ask your health care provider about: Changing or stopping your regular  medicines. These include any diabetes medicines or blood thinners you take. Taking medicines such as aspirin and ibuprofen. These medicines can thin your blood. Do not take them unless your health care provider tells you to. Taking over-the-counter medicines, vitamins, herbs, and supplements. General instructions Follow instructions from your health care provider about what you may eat and drink. Ask your health care provider what steps will be taken to help prevent infection. If you will be going home right after the procedure, plan to have a responsible adult: Take you home from the hospital or clinic. You will not be allowed to drive. Care for you for the time you are told. What happens during the procedure?  An IV will be inserted into one of your veins. You may be given a sedative. This helps you relax. You will be asked to lie on your side or sit. The injection site will be cleaned. An X-ray machine will be used to guide the needle as close as possible to the nerve causing pain. A needle will be put through your skin into the epidural space. This may cause you some discomfort. Contrast dye may be injected at the site to make sure that the steroid medicine will be sent to the exact place it needs to go. The steroid medicine and a numbing medicine (local anesthesia) will be injected into the epidural space for pain relief. The needle and IV will be removed. A bandage (dressing) will be put over the injection site. The procedure may vary among health care providers and hospitals. What happens after the procedure? Your blood pressure, heart rate, breathing rate, and blood oxygen level will be monitored until you leave the hospital or clinic.   Your arm or leg may feel weak or numb for a few hours. Summary An epidural steroid injection is a shot of steroid medicine and a numbing medicine that is given into the epidural space. The shot helps relieve pain caused by an irritated or swollen  nerve root. The steroid medicine usually starts working within 1-3 days. In some cases, you might need 7-10 days to feel the full effect. This information is not intended to replace advice given to you by your health care provider. Make sure you discuss any questions you have with your health care provider. Document Revised: 06/13/2021 Document Reviewed: 06/13/2021 Elsevier Patient Education  2023 [employer]  

## 2021-10-26 NOTE — Progress Notes (Signed)
PROVIDER NOTE: Information contained herein reflects review and annotations entered in association with encounter. Interpretation of such information and data should be left to medically-trained personnel. Information provided to patient can be located elsewhere in the medical record under "Patient Instructions". Document created using STT-dictation technology, any transcriptional errors that may result from process are unintentional.    Patient: Michele [name]  Service Category: E/M  Provider: Gillis Santa, MD  DOB: Jun 18, 1965  DOS: 10/26/2021  Referring Provider: Baxter Hire, MD  MRN: [id number]  Specialty: Interventional Pain Management  PCP: Baxter Hire, MD  Type: Established Patient  Setting: Ambulatory outpatient    Location: Office  Delivery: Face-to-face     HPI  Ms. Karelyn Brisby, a 56 y.o. year old female, is here today because of her Chronic right-sided low back pain with right-sided sciatica [M54.41, G89.29]. Ms. Hargreaves primary complain today is Back Pain (low) Last encounter: My last encounter with her was on 07/03/21 Pertinent problems: Ms. Schram has Chronic right-sided low back pain with right-sided sciatica; Chronic pain syndrome; Chronic painful diabetic neuropathy (HCC); and Chronic radicular lumbar pain on their pertinent problem list. Pain Assessment: Severity of Chronic pain is reported as a 10-Worst pain ever/10. Location: Back Lower/thighs at times, mostly lateral and worse on the right leg. Onset: More than a month ago. Quality: Constant, Aching, Sharp. Timing: Constant. Modifying factor(s):  Marland Kitchen Vitals:  height is '5\' 6"'  (1.676 m) and weight is 245 lb (111.1 kg). Her temporal temperature is 97.1 F (36.2 C) (abnormal). Her blood pressure is 141/92 (abnormal) and her pulse is 75. Her respiration is 18 and oxygen saturation is 100%.   Reason for encounter:   [name] presents today with increased lower back pain with radiation into her right buttock and now extending  to her right proximal thigh in a dermatomal fashion.  She is finding it difficult to walk.  She has tried to perform home physical therapy exercises that she has received by orthopedics for sciatica pain for the last 4 to 5 weeks with limited response.  She states that she is having increased muscle spasms of her lumbar spine.  Lumbar spine x-ray shows mild lumbar degenerative disc disease.  Given the radiating component of her pain worsening, I recommend follow-up with a lumbar MRI to evaluate for any right-sided neuroforaminal stenosis.  She states that it has been over 5 years since her last MRI.  Also recommend her start a neuropathic medication such as Lyrica.  She states that she has tried gabapentin in the past which was not beneficial.  Her dose of gabapentin in my opinion was subtherapeutic at 100 mg 3 times daily however she wants to try Lyrica instead.  I also recommend tizanidine nightly for muscle spasms.  Also recommend intramuscular Toradol today for increased lower back and radiating right leg pain.  Encourage patient to refrain from any NSAIDs for the next 7 days.  ROS  Constitutional: Denies any fever or chills Gastrointestinal: No reported hemesis, hematochezia, vomiting, or acute GI distress Musculoskeletal:  Low back pain with radiation into right buttock and thigh Neurological: No reported episodes of acute onset apraxia, aphasia, dysarthria, agnosia, amnesia, paralysis, loss of coordination, or loss of consciousness  Medication Review  amitriptyline, atorvastatin, glipiZIDE, insulin glargine, insulin glargine-yfgn, pantoprazole, pregabalin, sucralfate, tiZANidine, topiramate, triamterene-hydrochlorothiazide, and verapamil  History Review  Allergy: Ms. Fiske has No Known Allergies. Drug: Ms. Bracewell  reports no history of drug use. Alcohol:  reports current alcohol use. Tobacco:  reports that she has never smoked. Her smokeless tobacco use includes snuff. Social: Ms. Tricarico   reports that she has never smoked. Her smokeless tobacco use includes snuff. She reports current alcohol use. She reports that she does not use drugs. Medical:  has a past medical history of Diabetes mellitus without complication ([place]), High cholesterol, Hypertension, Migraine headache, and Migraines. Surgical: Ms. Dollens  has a past surgical history that includes Cesarean section; Knee surgery; Ankle surgery; Abdominal hysterectomy; and Wrist surgery. Family: family history is not on file.  Laboratory Chemistry Profile   Renal Lab Results  Component Value Date   BUN 23 (H) 06/04/2021   CREATININE 1.08 (H) 06/04/2021   GFRAA >60 05/13/2019   GFRNONAA >60 06/04/2021    Hepatic Lab Results  Component Value Date   AST 15 12/02/2020   ALT 18 12/02/2020   ALBUMIN 3.8 12/02/2020   ALKPHOS 82 12/02/2020   LIPASE 35 02/20/2019    Electrolytes Lab Results  Component Value Date   NA 139 06/04/2021   K 3.2 (L) 06/04/2021   CL 105 06/04/2021   CALCIUM 9.1 06/04/2021   MG 1.9 09/21/2015    Bone No results found for: "VD25OH", "VD125OH2TOT", "IZ1245YK9", "XI3382NK5", "25OHVITD1", "25OHVITD2", "25OHVITD3", "TESTOFREE", "TESTOSTERONE"  Inflammation (CRP: Acute Phase) (ESR: Chronic Phase) No results found for: "CRP", "ESRSEDRATE", "LATICACIDVEN"       Note: Above Lab results reviewed.  Recent Imaging Review  DG Chest 2 View CLINICAL DATA:  Chest pain  EXAM: CHEST - 2 VIEW  COMPARISON:  12/02/2020  FINDINGS: The heart size and mediastinal contours are within normal limits. Low lung volumes. Mildly increased bibasilar interstitial markings. No pleural effusion or pneumothorax. The visualized skeletal structures are unremarkable.  IMPRESSION: Low lung volumes with mildly increased bibasilar interstitial markings, which may reflect atelectasis versus infiltrate.  Electronically Signed   By: Davina Poke D.O.   On: 06/04/2021 15:45 Note: Reviewed        Physical Exam   General appearance: Well nourished, well developed, and well hydrated. In no apparent acute distress Mental status: Alert, oriented x 3 (person, place, & time)       Respiratory: No evidence of acute respiratory distress Eyes: PERLA Vitals: BP (!) 141/92   Pulse 75   Temp (!) 97.1 F (36.2 C) (Temporal)   Resp 18   Ht '5\' 6"'  (1.676 m)   Wt 245 lb (111.1 kg)   SpO2 100%   BMI 39.54 kg/m  BMI: Estimated body mass index is 39.54 kg/m as calculated from the following:   Height as of this encounter: '5\' 6"'  (1.676 m).   Weight as of this encounter: 245 lb (111.1 kg). Ideal: Ideal body weight: 59.3 kg (130 lb 11.7 oz) Adjusted ideal body weight: 80 kg (176 lb 7 oz)  Lumbar Spine Area Exam  Skin & Axial Inspection: No masses, redness, or swelling Alignment: Symmetrical Functional ROM: Pain restricted ROM       Stability: No instability detected Muscle Tone/Strength: Functionally intact. No obvious neuro-muscular anomalies detected. Sensory (Neurological): Dermatomal pain pattern right L4-L5 Palpation: No palpable anomalies       Provocative Tests: Hyperextension/rotation test: deferred today       Lumbar quadrant test (Kemp's test): (+) on the right for foraminal stenosis Lateral bending test: (+) ipsilateral radicular pain, on the right. Positive for right-sided foraminal stenosis.  Gait & Posture Assessment  Ambulation: Limited Gait: Relatively normal for age and body habitus Posture: WNL  Lower Extremity Exam  Side: Right lower extremity  Side: Left lower extremity  Stability: No instability observed          Stability: No instability observed          Skin & Extremity Inspection: Skin color, temperature, and hair growth are WNL. No peripheral edema or cyanosis. No masses, redness, swelling, asymmetry, or associated skin lesions. No contractures.  Skin & Extremity Inspection: Skin color, temperature, and hair growth are WNL. No peripheral edema or cyanosis. No masses, redness,  swelling, asymmetry, or associated skin lesions. No contractures.  Functional ROM: Pain restricted ROM for hip joint          Functional ROM: Unrestricted ROM                  Muscle Tone/Strength: Functionally intact. No obvious neuro-muscular anomalies detected.  Muscle Tone/Strength: Functionally intact. No obvious neuro-muscular anomalies detected.  Sensory (Neurological): Neurogenic pain pattern        Sensory (Neurological): Unimpaired        DTR: Patellar: deferred today Achilles: deferred today Plantar: deferred today  DTR: Patellar: deferred today Achilles: deferred today Plantar: deferred today  Palpation: No palpable anomalies  Palpation: No palpable anomalies    Assessment   Diagnosis Status  1. Chronic right-sided low back pain with right-sided sciatica   2. Chronic radicular lumbar pain   3. Chronic painful diabetic neuropathy ([place])   4. Chronic pain syndrome   5. Lumbar facet joint syndrome   6. Lumbar spondylosis    Controlled Controlled Controlled   Updated Problems: Problem  Chronic Painful Diabetic Neuropathy (Hcc)  Chronic Radicular Lumbar Pain  Chronic Right-Sided Low Back Pain With Right-Sided Sciatica  Chronic Pain Syndrome  Lumbar Facet Joint Syndrome    Plan of Care   Ms. Lawsyn Heiler has a current medication list which includes the following long-term medication(s): atorvastatin, glipizide, insulin glargine, pantoprazole, pregabalin, sucralfate, topiramate, triamterene-hydrochlorothiazide, and verapamil.  Patient has tried and failed home PT.  Recommend Lyrica trial as below.  Tizanidine as needed for lumbar paraspinal muscle spasms.  Recommend lumbar MRI to evaluate neuroforaminal stenosis as etiology of patient's radiating right leg pain.  Intramuscular Toradol today.  Refrain from NSAIDs for the next 3 days.   Pharmacotherapy (Medications Ordered): Meds ordered this encounter  Medications   pregabalin (LYRICA) 50 MG capsule    Sig:  Take 1 capsule (50 mg total) by mouth at bedtime for 15 days, THEN 2 capsules (100 mg total) at bedtime.    Dispense:  75 capsule    Refill:  0    Fill one day early if pharmacy is closed on scheduled refill date. May substitute for generic if available.   tiZANidine (ZANAFLEX) 4 MG tablet    Sig: Take 1 tablet (4 mg total) by mouth at bedtime as needed for muscle spasms.    Dispense:  30 tablet    Refill:  0    Do not place this medication, or any other prescription from our practice, on "Automatic Refill". Patient may have prescription filled one day early if pharmacy is closed on scheduled refill date.   ketorolac (TORADOL) 30 MG/ML injection 30 mg   Orders:  Orders Placed This Encounter  Procedures   Lumbar Epidural Injection    Standing Status:   Future    Standing Expiration Date:   01/26/2022    Scheduling Instructions:     Procedure: Interlaminar Lumbar Epidural Steroid injection (LESI)  Laterality: Right L4/5     Sedation: Po Valium     Timeframe: ASAA    Order Specific Question:   Where will this procedure be performed?    Answer:   [facility]   MR LUMBAR SPINE WO CONTRAST    Patient presents with axial pain with possible radicular component. Please assist [place] in identifying specific level(s) and laterality of any additional findings such as: 1. Facet (Zygapophyseal) joint DJD (Hypertrophy, space narrowing, subchondral sclerosis, and/or osteophyte formation) 2. DDD and/or IVDD (Loss of disc height, desiccation, gas patterns, osteophytes, endplate sclerosis, or "Black disc disease") 3. Pars defects 4. Spondylolisthesis, spondylosis, and/or spondyloarthropathies (include Degree/Grade of displacement in mm) (stability) 5. Vertebral body Fractures (acute/chronic) (state percentage of collapse) 6. Demineralization (osteopenia/osteoporotic) 7. Bone pathology 8. Foraminal narrowing  9. Surgical changes 10. Central, Lateral Recess, and/or Foraminal Stenosis  (include AP diameter of stenosis in mm) 11. Surgical changes (hardware type, status, and presence of fibrosis) 12. Modic Type Changes (MRI only) 13. IVDD (Disc bulge, protrusion, herniation, extrusion) (Level, laterality, extent)    Standing Status:   Future    Standing Expiration Date:   11/26/2021    Scheduling Instructions:     Imaging must be done as soon as possible. Inform patient that order will expire within 30 days and I will not renew it.    Order Specific Question:   What is the patient's sedation requirement?    Answer:   No Sedation    Order Specific Question:   Does the patient have a pacemaker or implanted devices?    Answer:   No    Order Specific Question:   Preferred imaging location?    Answer:   [facility] (table limit-350lbs)    Order Specific Question:   Call Results- Best Contact Number?    Answer:   [phone] ([facility])    Order Specific Question:   Radiology Contrast Protocol - do NOT remove file path    Answer:   [email or web address]   Follow-up plan:   Return in about 3 weeks (around 11/16/2021) for R L4/5 ESI , in clinic (PO Valium).     [place] 07/03/21    Recent Visits Date Type Provider Dept  08/14/21 Office Visit Gillis Santa, MD [facility]  Showing recent visits within past 90 days and meeting all other requirements Today's Visits Date Type Provider Dept  10/26/21 Office Visit Gillis Santa, MD [facility]  Showing today's visits and meeting all other requirements Future Appointments No visits were found meeting these conditions. Showing future appointments within next 90 days and meeting all other requirements  I discussed the assessment and treatment plan with the patient. The patient was provided an opportunity to ask questions and all were answered. The patient agreed with the plan and demonstrated an understanding of the instructions.  Patient advised to call back or seek an  in-person evaluation if the symptoms or condition worsens.  Duration of encounter: 43mnutes.  Total time on encounter, as per AMA guidelines included both the face-to-face and non-face-to-face time personally spent by the physician and/or other qualified health care professional(s) on the day of the encounter (includes time in activities that require the physician or other qualified health care professional and does not include time in activities normally performed by clinical staff). Physician's time may include the following activities when performed: preparing to see the patient (eg, review of tests, pre-charting review of records) obtaining and/or reviewing separately obtained history performing a  medically appropriate examination and/or evaluation counseling and educating the patient/family/caregiver ordering medications, tests, or procedures referring and communicating with other health care professionals (when not separately reported) documenting clinical information in the electronic or other health record independently interpreting results (not separately reported) and communicating results to the patient/ family/caregiver care coordination (not separately reported)  Note by: Gillis Santa, MD Date: 10/26/2021; Time: 9:37 AM

## 2021-11-07 ENCOUNTER — Telehealth: Payer: Self-pay

## 2021-11-14 ENCOUNTER — Other Ambulatory Visit: Payer: BC Managed Care – PPO

## 2021-11-16 ENCOUNTER — Ambulatory Visit: Payer: BC Managed Care – PPO

## 2021-11-16 NOTE — Telephone Encounter (Signed)
Patient states she will call me back to schedule epidural

## 2021-11-23 ENCOUNTER — Ambulatory Visit
Admission: RE | Admit: 2021-11-23 | Discharge: 2021-11-23 | Disposition: A | Discharge status: Home / Self Care | Payer: BC Managed Care – PPO | Source: Ambulatory Visit | Attending: Student in an Organized Health Care Education/Training Program | Admitting: Student in an Organized Health Care Education/Training Program

## 2021-11-23 DIAGNOSIS — M47816 Spondylosis without myelopathy or radiculopathy, lumbar region: Secondary | ICD-10-CM | POA: Insufficient documentation

## 2021-11-23 DIAGNOSIS — G894 Chronic pain syndrome: Secondary | ICD-10-CM | POA: Insufficient documentation

## 2021-11-23 DIAGNOSIS — G8929 Other chronic pain: Secondary | ICD-10-CM | POA: Diagnosis present

## 2021-11-23 DIAGNOSIS — M5416 Radiculopathy, lumbar region: Secondary | ICD-10-CM | POA: Diagnosis present

## 2021-11-27 ENCOUNTER — Telehealth: Payer: Self-pay | Admitting: *Deleted

## 2021-11-27 ENCOUNTER — Telehealth: Payer: BC Managed Care – PPO | Admitting: Student in an Organized Health Care Education/Training Program

## 2021-11-27 NOTE — Telephone Encounter (Signed)
MRI results read to patient. 

## 2021-11-27 NOTE — Telephone Encounter (Signed)
-----   Message from Gillis Santa, MD sent at 11/27/2021  9:24 AM EDT ----- Please call patient with lumbar MRI results.  Mild osteoarthritis.  She can consider over-the-counter NSAIDs, physical therapy, TENS unit.  She does not need follow-up with me.  Follow-up with PCP. ----- Message ----- From: Interface, Rad Results In Sent: 11/26/2021   3:05 PM EDT To: Gillis Santa, MD

## 2021-11-29 ENCOUNTER — Ambulatory Visit: Payer: BC Managed Care – PPO | Admitting: Student in an Organized Health Care Education/Training Program

## 2022-02-28 ENCOUNTER — Emergency Department: Payer: BC Managed Care – PPO

## 2022-02-28 ENCOUNTER — Other Ambulatory Visit: Payer: Self-pay

## 2022-02-28 ENCOUNTER — Emergency Department
Admission: EM | Admit: 2022-02-28 | Discharge: 2022-02-28 | Disposition: A | Discharge status: Home / Self Care | Payer: BC Managed Care – PPO | Attending: Emergency Medicine | Admitting: Emergency Medicine

## 2022-02-28 DIAGNOSIS — Z1152 Encounter for screening for COVID-19: Secondary | ICD-10-CM | POA: Insufficient documentation

## 2022-02-28 DIAGNOSIS — R059 Cough, unspecified: Secondary | ICD-10-CM | POA: Diagnosis present

## 2022-02-28 DIAGNOSIS — J111 Influenza due to unidentified influenza virus with other respiratory manifestations: Secondary | ICD-10-CM

## 2022-02-28 DIAGNOSIS — J101 Influenza due to other identified influenza virus with other respiratory manifestations: Secondary | ICD-10-CM | POA: Insufficient documentation

## 2022-02-28 DIAGNOSIS — Z8616 Personal history of COVID-19: Secondary | ICD-10-CM | POA: Insufficient documentation

## 2022-02-28 LAB — CBC WITH DIFFERENTIAL/PLATELET
Abs Immature Granulocytes: 0.02 10*3/uL (ref 0.00–0.07)
Basophils Absolute: 0 10*3/uL (ref 0.0–0.1)
Basophils Relative: 0 %
Eosinophils Absolute: 0 10*3/uL (ref 0.0–0.5)
Eosinophils Relative: 0 %
HCT: 38.1 % (ref 36.0–46.0)
Hemoglobin: 12.2 g/dL (ref 12.0–15.0)
Immature Granulocytes: 0 %
Lymphocytes Relative: 12 %
Lymphs Abs: 0.9 10*3/uL (ref 0.7–4.0)
MCH: 26 pg (ref 26.0–34.0)
MCHC: 32 g/dL (ref 30.0–36.0)
MCV: 81.2 fL (ref 80.0–100.0)
Monocytes Absolute: 0.7 10*3/uL (ref 0.1–1.0)
Monocytes Relative: 10 %
Neutro Abs: 5.5 10*3/uL (ref 1.7–7.7)
Neutrophils Relative %: 78 %
Platelets: 189 10*3/uL (ref 150–400)
RBC: 4.69 MIL/uL (ref 3.87–5.11)
RDW: 13.1 % (ref 11.5–15.5)
WBC: 7.1 10*3/uL (ref 4.0–10.5)
nRBC: 0 % (ref 0.0–0.2)

## 2022-02-28 LAB — LACTIC ACID, PLASMA
Lactic Acid, Venous: 1.4 mmol/L (ref 0.5–1.9)
Lactic Acid, Venous: 1.2 mmol/L (ref 0.5–1.9)

## 2022-02-28 LAB — COMPREHENSIVE METABOLIC PANEL
ALT: 17 U/L (ref 0–44)
AST: 19 U/L (ref 15–41)
Albumin: 3.8 g/dL (ref 3.5–5.0)
Alkaline Phosphatase: 75 U/L (ref 38–126)
Anion gap: 6 (ref 5–15)
BUN: 15 mg/dL (ref 6–20)
CO2: 25 mmol/L (ref 22–32)
Calcium: 8.6 mg/dL — ABNORMAL LOW (ref 8.9–10.3)
Chloride: 106 mmol/L (ref 98–111)
Creatinine, Ser: 0.97 mg/dL (ref 0.44–1.00)
GFR, Estimated: >60 mL/min (ref >60)
Glucose, Bld: 184 mg/dL — ABNORMAL HIGH (ref 70–99)
Potassium: 3.5 mmol/L (ref 3.5–5.1)
Sodium: 137 mmol/L (ref 135–145)
Total Bilirubin: 0.7 mg/dL (ref 0.3–1.2)
Total Protein: 6.7 g/dL (ref 6.5–8.1)

## 2022-02-28 LAB — RESP PANEL BY RT-PCR (RSV, FLU A&B, COVID)  RVPGX2
Influenza A by PCR: POSITIVE — AB
Influenza B by PCR: NEGATIVE
Resp Syncytial Virus by PCR: NEGATIVE
SARS Coronavirus 2 by RT PCR: NEGATIVE

## 2022-02-28 LAB — LIPASE, BLOOD: Lipase: 30 U/L (ref 11–51)

## 2022-02-28 LAB — TROPONIN I (HIGH SENSITIVITY)
Troponin I (High Sensitivity): 7 ng/L (ref <18)
Troponin I (High Sensitivity): 8 ng/L (ref <18)

## 2022-02-28 LAB — PROCALCITONIN: Procalcitonin: <0.1 ng/mL

## 2022-02-28 MED ORDER — IBUPROFEN 800 MG PO TABS
ORAL_TABLET | ORAL | Status: AC
  Administered 2022-02-28: 800 mg via ORAL
  Filled 2022-02-28: qty 1

## 2022-02-28 MED ORDER — OSELTAMIVIR PHOSPHATE 75 MG PO CAPS
75.0000 mg | ORAL_CAPSULE | Freq: Once | ORAL | Status: AC
  Administered 2022-02-28: 75 mg via ORAL
  Filled 2022-02-28: qty 1

## 2022-02-28 MED ORDER — OSELTAMIVIR PHOSPHATE 75 MG PO CAPS: 75.0000 mg | ORAL_CAPSULE | Freq: Two times a day (BID) | ORAL | 0 refills | Status: DC

## 2022-02-28 MED ORDER — ONDANSETRON 4 MG PO TBDP: 4.0000 mg | ORAL_TABLET | Freq: Four times a day (QID) | ORAL | 0 refills | Status: DC | PRN

## 2022-02-28 MED ORDER — ACETAMINOPHEN 500 MG PO TABS
1000.0000 mg | ORAL_TABLET | ORAL | Status: AC
  Administered 2022-02-28: 1000 mg via ORAL
  Filled 2022-02-28: qty 2

## 2022-02-28 MED ORDER — ACETAMINOPHEN 500 MG PO TABS
1000.0000 mg | ORAL_TABLET | Freq: Once | ORAL | Status: AC
  Administered 2022-02-28: 1000 mg via ORAL
  Filled 2022-02-28: qty 2

## 2022-02-28 MED ORDER — KETOROLAC TROMETHAMINE 60 MG/2ML IM SOLN
30.0000 mg | Freq: Once | INTRAMUSCULAR | Status: AC
  Administered 2022-02-28: 30 mg via INTRAMUSCULAR
  Filled 2022-02-28: qty 2

## 2022-02-28 MED ORDER — IBUPROFEN 800 MG PO TABS: 800.0000 mg | ORAL_TABLET | Freq: Once | ORAL | Status: AC

## 2022-02-28 MED ORDER — ACETAMINOPHEN 325 MG PO TABS: 650.0000 mg | ORAL_TABLET | Freq: Once | ORAL | Status: DC | PRN

## 2022-02-28 NOTE — ED Triage Notes (Signed)
Pt comes from home via POV c/o chest pain, abd pain, Cough, congestion, fever that has been going on for 2 days. Has also been feeling weaker than normal. Pt A&Ox4

## 2022-02-28 NOTE — ED Provider Triage Note (Signed)
Emergency Medicine Provider Triage Evaluation Note  Atley Scarboro , a 56 y.o. female  was evaluated in triage.  Pt complains of cough, congestion, chest pain, back pain, body aches.  Review of Systems  Positive: Chest pain, cough Negative: Vomiting  Physical Exam  There were no vitals taken for this visit. Gen:   Awake, mild distress   Resp:  Normal effort  MSK:   Moves extremities without difficulty  Other:  No rash  Medical Decision Making  Medically screening exam initiated at 1:47 AM.  Appropriate orders placed.  Cullen Vanallen was informed that the remainder of the evaluation will be completed by another provider, this initial triage assessment does not replace that evaluation, and the importance of remaining in the ED until their evaluation is complete.  56 year old female presenting with cold-like symptoms including chest pain and back pain.  Will obtain cardiac panel, respiratory panel and chest x-ray while patient awaits treatment room.   Irean Hong, MD 02/28/22 [phone]

## 2022-02-28 NOTE — ED Notes (Signed)
Assisted patient to BR.  Patient AAOx3.  Skin warm and dry. NAD

## 2022-02-28 NOTE — ED Provider Notes (Signed)
[facility] Provider Note    Event Date/Time   First MD Initiated Contact with Patient 02/28/22 1039     (approximate)   History   Body aches  HPI  Michele [name] is a 56 y.o. female experiencing bodyaches chest pain, achiness with coughing and congestion and fever for about 2 days.  She also reports she is feeling fatigued  Denies pregnancy     Physical Exam   Triage Vital Signs: ED Triage Vitals  Enc Vitals Group     BP 02/28/22 0149 (!) 161/88     Pulse Rate 02/28/22 0149 95     Resp 02/28/22 0149 20     Temp 02/28/22 0149 (!) 103.3 F (39.6 C)     Temp Source 02/28/22 0149 Oral     SpO2 02/28/22 0149 92 %     Weight 02/28/22 0202 245 lb (111.1 kg)     Height 02/28/22 0202 5\' 6"  (1.676 m)     Head Circumference --      Peak Flow --      Pain Score 02/28/22 0201 10     Pain Loc --      Pain Edu? --      Excl. in GC? --     Most recent vital signs: Vitals:   02/28/22 0843 02/28/22 1135  BP: (!) 151/84 118/74  Pulse: 85 80  Resp: 18 16  Temp: (!) 102.9 F (39.4 C) 99.1 F (37.3 C)  SpO2: 93% 94%     General: Awake, no distress.  Appears mildly ill but in no acute distress.  Resting comfortably at examination room.  Converses without difficulty.  Reports body aches all over CV:  Good peripheral perfusion.  Normal rate and tones Resp:  Normal effort.  Clear bilaterally Abd:  No distention.  Soft nontender nondistended Other:  Moist mucous membranes.  No alteration of mental status.  Fully alert   ED Results / Procedures / Treatments   Labs (all labs ordered are listed, but only abnormal results are displayed) Labs Reviewed  RESP PANEL BY RT-PCR (RSV, FLU A&B, COVID)  RVPGX2 - Abnormal; Notable for the following components:      Result Value   Influenza A by PCR POSITIVE (*)    All other components within normal limits  COMPREHENSIVE METABOLIC PANEL - Abnormal; Notable for the following components:   Glucose, Bld 184 (*)     Calcium 8.6 (*)    All other components within normal limits  CULTURE, BLOOD (ROUTINE X 2)  CULTURE, BLOOD (ROUTINE X 2)  CBC WITH DIFFERENTIAL/PLATELET  LIPASE, BLOOD  LACTIC ACID, PLASMA  LACTIC ACID, PLASMA  PROCALCITONIN  TROPONIN I (HIGH SENSITIVITY)  TROPONIN I (HIGH SENSITIVITY)     EKG  Interpreted by me at 245 heart rate 90 QRS 80 QTc 430 Normal sinus rhythm no evidence of ischemia   RADIOLOGY  Chest x-ray with mild atelectasis versus possible infiltrate.   PROCEDURES:  Critical Care performed: No  Procedures   MEDICATIONS ORDERED IN ED: Medications  acetaminophen (TYLENOL) tablet 1,000 mg (1,000 mg Oral Given 02/28/22 0210)  ibuprofen (ADVIL) tablet 800 mg (800 mg Oral Given 02/28/22 0850)  acetaminophen (TYLENOL) tablet 1,000 mg (1,000 mg Oral Given 02/28/22 1052)  oseltamivir (TAMIFLU) capsule 75 mg (75 mg Oral Given 02/28/22 1131)  ketorolac (TORADOL) injection 30 mg (30 mg Intramuscular Given 02/28/22 1052)     IMPRESSION / MDM / ASSESSMENT AND PLAN / ED COURSE  I reviewed  the triage vital signs and the nursing notes.                              Differential diagnosis includes, but is not limited to, viral illness, pneumonia, bronchitis or other respiratory infectious symptoms .  Patient did test positive for influenza.  No symptoms of be suggestive of cardiac ischemia.  Labs reassuring with normal CBC, low procalcitonin argues against bacterial pulmonary infection, and I suspect that this is likely all related to having influenza A.  Will treat with Tamiflu after discussing risks and benefits with the patient.  Also patient received dose of Toradol for myalgias, and thereafter she did report feeling improved vital signs of normalized and patient is felt appropriate for outpatient treatment with careful return precautions  Patient's presentation is most consistent with acute complicated illness / injury requiring diagnostic workup.  The patient  is on the cardiac monitor to evaluate for evidence of arrhythmia and/or significant heart rate changes.  Return precautions and treatment recommendations and follow-up discussed with the patient who is agreeable with the plan.      FINAL CLINICAL IMPRESSION(S) / ED DIAGNOSES   Final diagnoses:  Influenza     Rx / DC Orders   ED Discharge Orders          Ordered    oseltamivir (TAMIFLU) 75 MG capsule  2 times daily        02/28/22 1046    ondansetron (ZOFRAN-ODT) 4 MG disintegrating tablet  Every 6 hours PRN        02/28/22 1046             Note:  This document was prepared using Dragon voice recognition software and may include unintentional dictation errors.   Sharyn Creamer, MD 02/28/22 1651

## 2022-03-05 LAB — CULTURE, BLOOD (ROUTINE X 2)
Culture: NO GROWTH
Culture: NO GROWTH
Special Requests: ADEQUATE
Special Requests: ADEQUATE

## 2022-03-21 ENCOUNTER — Emergency Department
Admission: EM | Admit: 2022-03-21 | Discharge: 2022-03-21 | Disposition: A | Discharge status: Home / Self Care | Payer: BC Managed Care – PPO | Attending: Emergency Medicine | Admitting: Emergency Medicine

## 2022-03-21 ENCOUNTER — Other Ambulatory Visit: Payer: Self-pay

## 2022-03-21 DIAGNOSIS — E119 Type 2 diabetes mellitus without complications: Secondary | ICD-10-CM | POA: Diagnosis not present

## 2022-03-21 DIAGNOSIS — M5442 Lumbago with sciatica, left side: Secondary | ICD-10-CM | POA: Insufficient documentation

## 2022-03-21 DIAGNOSIS — M5441 Lumbago with sciatica, right side: Secondary | ICD-10-CM | POA: Insufficient documentation

## 2022-03-21 DIAGNOSIS — Z794 Long term (current) use of insulin: Secondary | ICD-10-CM | POA: Insufficient documentation

## 2022-03-21 DIAGNOSIS — G8929 Other chronic pain: Secondary | ICD-10-CM | POA: Insufficient documentation

## 2022-03-21 DIAGNOSIS — M545 Low back pain, unspecified: Secondary | ICD-10-CM | POA: Diagnosis present

## 2022-03-21 DIAGNOSIS — Z79899 Other long term (current) drug therapy: Secondary | ICD-10-CM | POA: Diagnosis not present

## 2022-03-21 MED ORDER — TIZANIDINE HCL 4 MG PO TABS: 4.0000 mg | ORAL_TABLET | Freq: Every day | ORAL | 0 refills | Status: AC

## 2022-03-21 MED ORDER — METHOCARBAMOL 500 MG PO TABS
1000.0000 mg | ORAL_TABLET | Freq: Once | ORAL | Status: AC
  Administered 2022-03-21: 1000 mg via ORAL
  Filled 2022-03-21: qty 2

## 2022-03-21 MED ORDER — MELOXICAM 15 MG PO TABS: 15.0000 mg | ORAL_TABLET | Freq: Every day | ORAL | 0 refills | Status: AC

## 2022-03-21 MED ORDER — KETOROLAC TROMETHAMINE 30 MG/ML IJ SOLN
30.0000 mg | Freq: Once | INTRAMUSCULAR | Status: AC
  Administered 2022-03-21: 30 mg via INTRAMUSCULAR
  Filled 2022-03-21: qty 1

## 2022-03-21 MED ORDER — MELOXICAM 15 MG PO TABS: 15.0000 mg | ORAL_TABLET | Freq: Every day | ORAL | 0 refills | Status: DC

## 2022-03-21 MED ORDER — HYDROCODONE-ACETAMINOPHEN 5-325 MG PO TABS: 1.0000 | ORAL_TABLET | Freq: Four times a day (QID) | ORAL | 0 refills | Status: DC | PRN

## 2022-03-21 MED ORDER — HYDROCODONE-ACETAMINOPHEN 5-325 MG PO TABS: 1.0000 | ORAL_TABLET | Freq: Four times a day (QID) | ORAL | 0 refills | Status: AC | PRN

## 2022-03-21 MED ORDER — TIZANIDINE HCL 4 MG PO TABS: 4.0000 mg | ORAL_TABLET | Freq: Every day | ORAL | 0 refills | Status: DC

## 2022-03-21 NOTE — ED Triage Notes (Signed)
Pt here with sciatic nerve pain. Pt states she is having trouble walking and standing. Pt states pain is constant this time.

## 2022-03-21 NOTE — ED Provider Notes (Addendum)
[facility] Provider Note    Event Date/Time   First MD Initiated Contact with Patient 03/21/22 1237     (approximate)   History   Back Pain and Leg Pain   HPI  Michele [name] is a 57 y.o. female   to the ED with complaint of low back pain and sciatica for the last 1 and half weeks.  Patient states she has had sciatica in the past and was seeing Dr. Zollie Scale until she recently had an MRI that stated that she had osteoarthritis and she was referred back to her PCP for further management of her back pain.  Patient states that she does not have Lyrica or Zanaflex that was prescribed by Dr. Zollie Scale at that time.  Patient denies any recent injury or changes in her symptoms.  She was seen in the office on 10/26/2021 at which time Toradol was also given in the office which seem to help.  Patient denies any urinary symptoms or incontinence of bowel or bladder.  She continues to ambulate without any assistance.      Physical Exam   Triage Vital Signs: ED Triage Vitals  Enc Vitals Group     BP 03/21/22 1159 135/87     Pulse Rate 03/21/22 1159 72     Resp 03/21/22 1159 20     Temp 03/21/22 1159 98.8 F (37.1 C)     Temp Source 03/21/22 1159 Oral     SpO2 03/21/22 1159 99 %     Weight 03/21/22 1158 244 lb 14.9 oz (111.1 kg)     Height 03/21/22 1158 5\' 6"  (1.676 m)     Head Circumference --      Peak Flow --      Pain Score 03/21/22 1158 10     Pain Loc --      Pain Edu? --      Excl. in [place]? --     Most recent vital signs: Vitals:   03/21/22 1159 03/21/22 1417  BP: 135/87 128/73  Pulse: 72 61  Resp: 20 16  Temp: 98.8 F (37.1 C) 98 F (36.7 C)  SpO2: 99% 100%     General: Awake, no distress.  CV:  Good peripheral perfusion.  Resp:  Normal effort.  Abd:  No distention.  Other:  On examination of the lumbar spine there is no gross deformity however patient is tender lower lumbar and bilateral SI joint areas and surrounding tissue.  Good muscle  strength bilaterally at 5/5.  Straight leg raises with pain at 20 degrees bilaterally.  Pulses are present, skin intact, patient is able to stand and ambulate without assistance.   ED Results / Procedures / Treatments   Labs (all labs ordered are listed, but only abnormal results are displayed) Labs Reviewed - No data to display    RADIOLOGY Deferred    PROCEDURES:  Critical Care performed:   Procedures   MEDICATIONS ORDERED IN ED: Medications  ketorolac (TORADOL) 30 MG/ML injection 30 mg (30 mg Intramuscular Given 03/21/22 1315)  methocarbamol (ROBAXIN) tablet 1,000 mg (1,000 mg Oral Given 03/21/22 1315)     IMPRESSION / MDM / ASSESSMENT AND PLAN / ED COURSE  I reviewed the triage vital signs and the nursing notes.   Differential diagnosis includes, but is not limited to, chronic low back pain, osteoarthritis, sciatica, also skeletal pain.  57 year old female presents to the ED with complaint of low back pain and bilateral sciatica for which she also has  a history of.  Patient has been seen by the pain clinic and released to return to her PCP.  She states that she is unable to get an appointment until the end of this month.  Physical exam is consistent with sciatica.  MRI of the lumbar spine done on 11/2021 was reviewed.  Patient does not present with any symptoms or physical with concerns for cauda equina.  Review of her medications shows that she has taken Zanaflex at bedtime and states she did well with it.  I will defer using steroids at this time as patient is diabetic with long-term insulin use.  A prescription for hydrocodone, meloxicam and Zanaflex was sent to the pharmacy for her to begin taking.  She is also encouraged to use ice or heat and per Dr. Holley Raring encouraged her to use a TENS unit to see if this helps with her chronic back pain and sciatica.  He is also encouraged to keep her appointment with her PCP Dr. Edwina Barth.  ----------------------------------------- 4:07  PM on 03/21/2022 ----------------------------------------- Patient called back stating that hydrocodone was not available at the [facility] on [address] which was verified by myself with talking with the pharmacist.  Prescription for the the hydrocodone was sent to [facility] [address].    Patient's presentation is most consistent with acute, uncomplicated illness.  FINAL CLINICAL IMPRESSION(S) / ED DIAGNOSES   Final diagnoses:  Chronic low back pain with bilateral sciatica, unspecified back pain laterality     Rx / DC Orders   ED Discharge Orders          Ordered    meloxicam (MOBIC) 15 MG tablet  Daily,   Status:  Discontinued        03/21/22 1314    tiZANidine (ZANAFLEX) 4 MG tablet  Daily at bedtime,   Status:  Discontinued        03/21/22 1314    HYDROcodone-acetaminophen (NORCO/VICODIN) 5-325 MG tablet  Every 6 hours PRN,   Status:  Discontinued        03/21/22 1314    HYDROcodone-acetaminophen (NORCO/VICODIN) 5-325 MG tablet  Every 6 hours PRN        03/21/22 1403    meloxicam (MOBIC) 15 MG tablet  Daily        03/21/22 1403    tiZANidine (ZANAFLEX) 4 MG tablet  Daily at bedtime        03/21/22 1403             Note:  This document was prepared using Dragon voice recognition software and may include unintentional dictation errors.   Johnn Hai, PA-C 03/21/22 1422    Johnn Hai, PA-C 03/21/22 1608    Blake Divine, MD 03/24/22 [phone]

## 2022-03-21 NOTE — Discharge Instructions (Addendum)
Keep your appointment with your primary care provider as already scheduled.  A prescription for hydrocodone if needed for severe pain was sent to the pharmacy along with meloxicam to take daily with food and Zanaflex at bedtime is a muscle relaxant.  You may use ice or heat to your back as needed for discomfort.  In looking at your chart with Dr. Holley Raring he recommended a TENS unit which may be beneficial.  This can be discussed with your PCP at your appointment.

## 2022-09-03 ENCOUNTER — Other Ambulatory Visit: Payer: Self-pay

## 2022-09-03 ENCOUNTER — Encounter: Payer: Self-pay | Admitting: Emergency Medicine

## 2022-09-03 DIAGNOSIS — W57XXXA Bitten or stung by nonvenomous insect and other nonvenomous arthropods, initial encounter: Secondary | ICD-10-CM | POA: Diagnosis not present

## 2022-09-03 DIAGNOSIS — I10 Essential (primary) hypertension: Secondary | ICD-10-CM | POA: Insufficient documentation

## 2022-09-03 DIAGNOSIS — S70362A Insect bite (nonvenomous), left thigh, initial encounter: Secondary | ICD-10-CM | POA: Diagnosis not present

## 2022-09-03 DIAGNOSIS — E119 Type 2 diabetes mellitus without complications: Secondary | ICD-10-CM | POA: Insufficient documentation

## 2022-09-03 DIAGNOSIS — S79922A Unspecified injury of left thigh, initial encounter: Secondary | ICD-10-CM | POA: Diagnosis present

## 2022-09-03 NOTE — ED Triage Notes (Signed)
Pt arrived via POV with reports of possible insect bite to L outer thigh, pt states she was bit yesterday afternoon, states tonight she noticed pain and some redness and swelling to area.  Pt states it was either a spider or a bee.

## 2022-09-04 ENCOUNTER — Emergency Department
Admission: EM | Admit: 2022-09-04 | Discharge: 2022-09-04 | Disposition: A | Discharge status: Home / Self Care | Payer: BC Managed Care – PPO | Attending: Emergency Medicine | Admitting: Emergency Medicine

## 2022-09-04 DIAGNOSIS — W57XXXA Bitten or stung by nonvenomous insect and other nonvenomous arthropods, initial encounter: Secondary | ICD-10-CM

## 2022-09-04 MED ORDER — DOXYCYCLINE HYCLATE 100 MG PO TABS: 100.0000 mg | ORAL_TABLET | Freq: Two times a day (BID) | ORAL | 0 refills | Status: DC

## 2022-09-04 MED ORDER — DOXYCYCLINE HYCLATE 100 MG PO TABS
100.0000 mg | ORAL_TABLET | Freq: Once | ORAL | Status: AC
  Administered 2022-09-04: 100 mg via ORAL
  Filled 2022-09-04: qty 1

## 2022-09-04 NOTE — ED Notes (Signed)
Discharge instructions provide by edp were discussed with pt. Pt verbalized understanding with no additional questions at this time.

## 2022-09-04 NOTE — ED Provider Notes (Signed)
[facility] Provider Note    Event Date/Time   First MD Initiated Contact with Patient 09/04/22 0124     (approximate)  History   Chief Complaint: Insect Bite  HPI  Michele [name] is a 57 y.o. female with a past medical history of diabetes, hypertension, hyperlipidemia presents to the emergency department for a red tender and painful area to her left thigh.  Patient states yesterday approximately little over 24 hours ago she was stung or bitten by something to his left leg.  Patient states she felt the sting or bite and swatted at the area and believes it was an insect that flew away although she is not entirely sure.  She states today she has noticed increased pain and redness extending approximately an inch to 2 inches from the bite site so she came to the emergency department for evaluation.  Physical Exam   Triage Vital Signs: ED Triage Vitals  Enc Vitals Group     BP 09/03/22 2334 (!) 152/89     Pulse Rate 09/03/22 2334 76     Resp 09/03/22 2334 18     Temp 09/03/22 2334 98.5 F (36.9 C)     Temp Source 09/03/22 2334 Oral     SpO2 09/03/22 2334 100 %     Weight 09/03/22 2337 244 lb 14.9 oz (111.1 kg)     Height 09/03/22 2337 5\' 6"  (1.676 m)     Head Circumference --      Peak Flow --      Pain Score 09/03/22 2337 7     Pain Loc --      Pain Edu? --      Excl. in [place]? --     Most recent vital signs: Vitals:   09/03/22 2334 09/04/22 0100  BP: (!) 152/89 139/84  Pulse: 76 72  Resp: 18 16  Temp: 98.5 F (36.9 C)   SpO2: 100% 100%    General: Awake, no distress.  CV:  Good peripheral perfusion.  Regular rate and rhythm  Resp:  Normal effort.  Equal breath sounds bilaterally.  Abd:  No distention.  Soft, nontender.  No rebound or guarding. Other:  Patient has an area of erythema approximately 4 cm in diameter with mild induration around a small pustule/puncture which could be consistent with an insect bite/sting   ED Results /  Procedures / Treatments    MEDICATIONS ORDERED IN ED: Medications - No data to display   IMPRESSION / MDM / ASSESSMENT AND PLAN / ED COURSE  I reviewed the triage vital signs and the nursing notes.  Patient's presentation is most consistent with acute illness / injury with system symptoms.  Patient presents emergency department for increased redness and pain to her left thigh where she was bitten or stung by an insect little over 24 hours ago.  Patient states she did not see the insect but felt the sting and thought she saw something fly away.  Could be a localized reaction could also be consistent with cellulitis less likely tick related as the patient felt the sting.  Given the small pustule and surrounding erythema will cover with doxycycline as precaution.  Discussed with the patient use of over-the-counter topical medications for relief as well as Benadryl as needed for discomfort and to help with sleep at night.  Patient agreeable to plan of care.  FINAL CLINICAL IMPRESSION(S) / ED DIAGNOSES   Insect bite   Note:  This document was prepared using  [employer] and may include unintentional dictation errors.   Minna Antis, MD 09/04/22 [phone]

## 2022-09-04 NOTE — Discharge Instructions (Signed)
Please take antibiotic as prescribed for its entire course.  Avoid sun exposure while taking doxycycline.  You may use over-the-counter Benadryl as needed for discomfort and to help with sleep, as written on the box.  He may also use over-the-counter topicals especially ones that contain benzocaine which can help with discomfort.  Return to the emergency department for any worsening swelling redness fever or any other symptom concerning to yourself.

## 2022-12-16 ENCOUNTER — Emergency Department
Admission: EM | Admit: 2022-12-16 | Discharge: 2022-12-16 | Disposition: A | Discharge status: Home / Self Care | Payer: BC Managed Care – PPO | Attending: Emergency Medicine | Admitting: Emergency Medicine

## 2022-12-16 ENCOUNTER — Emergency Department: Payer: BC Managed Care – PPO

## 2022-12-16 ENCOUNTER — Other Ambulatory Visit: Payer: Self-pay

## 2022-12-16 DIAGNOSIS — R079 Chest pain, unspecified: Secondary | ICD-10-CM | POA: Insufficient documentation

## 2022-12-16 DIAGNOSIS — E119 Type 2 diabetes mellitus without complications: Secondary | ICD-10-CM | POA: Diagnosis not present

## 2022-12-16 LAB — BASIC METABOLIC PANEL
Anion gap: 12 (ref 5–15)
BUN: 19 mg/dL (ref 6–20)
CO2: 23 mmol/L (ref 22–32)
Calcium: 9.1 mg/dL (ref 8.9–10.3)
Chloride: 101 mmol/L (ref 98–111)
Creatinine, Ser: 0.93 mg/dL (ref 0.44–1.00)
GFR, Estimated: >60 mL/min (ref >60)
Glucose, Bld: 216 mg/dL — ABNORMAL HIGH (ref 70–99)
Potassium: 4.1 mmol/L (ref 3.5–5.1)
Sodium: 136 mmol/L (ref 135–145)

## 2022-12-16 LAB — CBC
HCT: 43.1 % (ref 36.0–46.0)
Hemoglobin: 13.9 g/dL (ref 12.0–15.0)
MCH: 25.9 pg — ABNORMAL LOW (ref 26.0–34.0)
MCHC: 32.3 g/dL (ref 30.0–36.0)
MCV: 80.3 fL (ref 80.0–100.0)
Platelets: 334 10*3/uL (ref 150–400)
RBC: 5.37 MIL/uL — ABNORMAL HIGH (ref 3.87–5.11)
RDW: 13.4 % (ref 11.5–15.5)
WBC: 11.1 10*3/uL — ABNORMAL HIGH (ref 4.0–10.5)
nRBC: 0 % (ref 0.0–0.2)

## 2022-12-16 LAB — TROPONIN I (HIGH SENSITIVITY)
Troponin I (High Sensitivity): 3 ng/L (ref <18)
Troponin I (High Sensitivity): 3 ng/L (ref <18)

## 2022-12-16 MED ORDER — LIDOCAINE 5 % EX PTCH: 1.0000 | MEDICATED_PATCH | Freq: Two times a day (BID) | CUTANEOUS | 0 refills | Status: AC

## 2022-12-16 MED ORDER — LIDOCAINE 5 % EX PTCH
1.0000 | MEDICATED_PATCH | CUTANEOUS | Status: DC
  Administered 2022-12-16: 1 via TRANSDERMAL
  Filled 2022-12-16: qty 1

## 2022-12-16 MED ORDER — OXYCODONE-ACETAMINOPHEN 5-325 MG PO TABS
1.0000 | ORAL_TABLET | Freq: Once | ORAL | Status: AC
  Administered 2022-12-16: 1 via ORAL
  Filled 2022-12-16 (×2): qty 1

## 2022-12-16 NOTE — ED Provider Notes (Signed)
[facility] Provider Note    Event Date/Time   First MD Initiated Contact with Patient 12/16/22 2148     (approximate)   History   Chief Complaint Chest Pain (X 4 days)   HPI  Citlaly Camplin is a 57 y.o. female with past medical history of diabetes, migraines, and chronic pain syndrome who presents to the ED complaining of chest pain.  Patient reports that she has had constant pain over the left side of her chest for the past 4 days.  She describes the pain as a "pulling" but denies any heavy lifting or trauma to her chest.  She has not had any fevers, cough, or difficulty breathing.  She has not noticed any pain or swelling in her legs.     Physical Exam   Triage Vital Signs: ED Triage Vitals  Encounter Vitals Group     BP 12/16/22 1921 (!) 136/106     Systolic BP Percentile --      Diastolic BP Percentile --      Pulse Rate 12/16/22 1921 81     Resp 12/16/22 1921 20     Temp 12/16/22 1921 98.6 F (37 C)     Temp src --      SpO2 12/16/22 1921 99 %     Weight 12/16/22 1920 237 lb (107.5 kg)     Height 12/16/22 1920 5\' 6"  (1.676 m)     Head Circumference --      Peak Flow --      Pain Score 12/16/22 1921 9     Pain Loc --      Pain Education --      Exclude from Growth Chart --     Most recent vital signs: Vitals:   12/16/22 1921  BP: (!) 136/106  Pulse: 81  Resp: 20  Temp: 98.6 F (37 C)  SpO2: 99%    Constitutional: Alert and oriented. Eyes: Conjunctivae are normal. Head: Atraumatic. Nose: No congestion/rhinnorhea. Mouth/Throat: Mucous membranes are moist.  Cardiovascular: Normal rate, regular rhythm. Grossly normal heart sounds.  2+ radial pulses bilaterally. Respiratory: Normal respiratory effort.  No retractions. Lungs CTAB.  Left chest wall tenderness to palpation noted. Gastrointestinal: Soft and nontender. No distention. Musculoskeletal: No lower extremity tenderness nor edema.  Neurologic:  Normal speech and  language. No gross focal neurologic deficits are appreciated.    ED Results / Procedures / Treatments   Labs (all labs ordered are listed, but only abnormal results are displayed) Labs Reviewed  BASIC METABOLIC PANEL - Abnormal; Notable for the following components:      Result Value   Glucose, Bld 216 (*)    All other components within normal limits  CBC - Abnormal; Notable for the following components:   WBC 11.1 (*)    RBC 5.37 (*)    MCH 25.9 (*)    All other components within normal limits  TROPONIN I (HIGH SENSITIVITY)  TROPONIN I (HIGH SENSITIVITY)     EKG  ED ECG REPORT I, Chesley Noon, the attending physician, personally viewed and interpreted this ECG.   Date: 12/16/2022  EKG Time: 19:23  Rate: 78  Rhythm: normal sinus rhythm  Axis: Normal  Intervals:none  ST&T Change: None  RADIOLOGY Chest x-ray reviewed and interpreted by me with no infiltrate, edema, or effusion.  PROCEDURES:  Critical Care performed: No  Procedures   MEDICATIONS ORDERED IN ED: Medications  lidocaine (LIDODERM) 5 % 1 patch (1 patch Transdermal Patch Applied  12/16/22 2223)  oxyCODONE-acetaminophen (PERCOCET/ROXICET) 5-325 MG per tablet 1 tablet (1 tablet Oral Not Given 12/16/22 2237)     IMPRESSION / MDM / ASSESSMENT AND PLAN / ED COURSE  I reviewed the triage vital signs and the nursing notes.                              57 y.o. female with past medical history of diabetes, migraines, and chronic pain syndrome who presents to the ED complaining of 4 days of constant left-sided chest pain.  Patient's presentation is most consistent with acute presentation with potential threat to life or bodily function.  Differential diagnosis includes, but is not limited to, ACS, PE, pneumonia, pneumothorax, musculoskeletal pain, GERD, anxiety.  Patient nontoxic-appearing and in no acute distress, vital signs remarkable for hypertension but otherwise reassuring.  EKG shows no evidence  of arrhythmia or ischemia and 2 sets of troponin are negative.  Suspect musculoskeletal pain given it is reproducible with palpation.  Chest x-ray is unremarkable and remainder of labs are reassuring with no significant anemia, leukocytosis, electrolyte abnormality, or AKI.  Patient appropriate for discharge home with outpatient follow-up, was counseled to return to the ED for new or worsening symptoms.  Patient agrees with plan.      FINAL CLINICAL IMPRESSION(S) / ED DIAGNOSES   Final diagnoses:  Nonspecific chest pain     Rx / DC Orders   ED Discharge Orders     None        Note:  This document was prepared using Dragon voice recognition software and may include unintentional dictation errors.   Chesley Noon, MD 12/16/22 2239

## 2022-12-16 NOTE — ED Triage Notes (Signed)
Pt to ed from home via POV for chest pain x 4 days. Pt states "I normally have chest pain on the right side and its constantly on the left side this time". Pt is caox4, in no acute distress in triage. Pt does have some diarrhea as well.

## 2022-12-19 NOTE — Therapy (Unsigned)
OUTPATIENT PHYSICAL THERAPY EVALUATION   Patient Name: Michele [name] MRN: [id number] DOB:01/20/1966, 57 y.o., female Today's Date: 12/20/2022  END OF SESSION:  PT End of Session - 12/20/22 1829     Visit Number 17    Number of Visits 13    Date for PT Re-Evaluation 03/14/23    Authorization Type [employer] reporting period from 12/20/2022    Authorization Time Period needs auth    Progress Note Due on Visit 10    PT Start Time 1605    PT Stop Time 1645    PT Time Calculation (min) 40 min    Activity Tolerance Patient limited by pain    Behavior During Therapy --   Lethargic for most of subjective and part of objective exam            Past Medical History:  Diagnosis Date   Diabetes mellitus without complication (HCC)    High cholesterol    Hypertension    Migraine headache    Migraines    Past Surgical History:  Procedure Laterality Date   ABDOMINAL HYSTERECTOMY     ANKLE SURGERY     CESAREAN SECTION     KNEE SURGERY     WRIST SURGERY     Patient Active Problem List   Diagnosis Date Noted   Chronic painful diabetic neuropathy (HCC) 10/26/2021   Chronic radicular lumbar pain 10/26/2021   Lumbar facet joint syndrome 03/21/2018   Chronic right-sided low back pain with right-sided sciatica 03/21/2018   Chronic pain syndrome 03/21/2018   Obesity, morbid, BMI 40.0-49.9 (HCC) 07/12/2017   Bilateral occipital neuralgia 09/14/2016   Neck pain 12/16/2014   Migraines 04/28/2014   Diabetes mellitus type 2, uncomplicated (HCC) 04/28/2014    PCP:  Gracelyn Nurse, MD  REFERRING PROVIDER: Dorthula Nettles, DO  REFERRING DIAG: acute pain of left shoulder, impingement syndrome of left shoulder, arthritis of left acromioclavicular joint, bursitis of left shoulder, tendinitis of left shoulder  Rationale for Evaluation and Treatment: Rehabilitation  THERAPY DIAG:  Left shoulder pain, unspecified chronicity  ONSET DATE: 11/24/2022    SUBJECTIVE:                                                                                                                                                                                            SUBJECTIVE STATEMENT: Patient states she has pain at the anterior left shoulder and it won't stop. She states this pain started 11/24/2022 while she was working. She was reaching for a package and she felt a sharp pain and a pop at the anterior left shoulder. She kept working  but the pain went down into her hand over a couple of hours, so she reported it to her lead supervisor. She states workman's comp said it was a wear and tear injury and they don't cover that, so they denied coverage of it. She states her hand pain improved after about a week. Now she has pain only at the left anterior shoulder. She went to urgent care by [employer], where they did an xray, gave her an injection, checked her chest, and put a brace on her arm, and said not to use her arm.   She went to the clinic behind the hospital and they took her out of work until she sees orthopedics. She saw Dr. Landry Mellow on 12/05/2022 who gave her a steroid injection, which helped 25% probably. He recommended light duty an dher work would not accept it so she has remained out of work. He wanted her to go to physical therapy but she has not started until now. She saw Dr. Landry Mellow again yesterday and he wanted her to go to physical therapy, so she is here now. She denies any previous shoulder problems or neck problems.   She thinks her shoulder is getting better overall. It is making it hard for her to sleep, so she is very sleepy today.   Received a 7 peroneal bursa steroid injection with about 25% symptom improvement on 12/05/2022  and has had to rest from work because they could not accommodate modified work duty per physician documentation.   She states she has good and bad days.   She is right handed. She states someone told her that PT could tell her if she could work  or not.   PERTINENT HISTORY:  Patient is a 56 y.o. female who presents to outpatient physical therapy with a referral for medical diagnosis acute pain of left shoulder, impingement syndrome of left shoulder, arthritis of left acromioclavicular joint, bursitis of left shoulder, tendinitis of left shoulder. This patient's chief complaints consist of left anterior shoulder pain that radiated down to hand for the first week, leading to the following functional deficits: currently unable to work, difficulty using the left UE with anything including reaching, carrying, dressing, working, sleeping. Relevant past medical history and comorbidities include has Neck pain; Migraines; Obesity, morbid, BMI 40.0-49.9 (HCC); Diabetes mellitus type 2, uncomplicated (HCC); Bilateral occipital neuralgia; Lumbar facet joint syndrome; Chronic right-sided low back pain with right-sided sciatica; Chronic pain syndrome; Chronic painful diabetic neuropathy (HCC); and Chronic radicular lumbar pain on their problem list.  has a past medical history of Diabetes mellitus without complication (HCC), High cholesterol, Hypertension, Migraine headache, and Migraines.  has a past surgical history that includes Cesarean section; Knee surgery; Ankle surgery; Abdominal hysterectomy; and Wrist surgery (right). Patient denies hx of cancer, stroke, seizures, lung problems, heart problems, unexplained weight loss, unexplained changes in bowel or bladder problems, osteoporosis, and spinal surgery. She states she is always dropping things.   PAIN: Are you having pain? Yes NPRS: Current: 7/10,  Best: 7/10, Worst: 10/10. Pain location: left anterior shoulder Pain description: "just sitting there" "as soon as I move it will pull in" just an aching throb Aggravating factors: stretching out left arm, lifting it,  Relieving factors: steroid injection, resting shoulder/UE   FUNCTIONAL LIMITATIONS: currently unable to work, difficulty using the left UE  with anything including reaching, carrying, dressing, working, sleeping.   LEISURE: sitting in the living room  PRECAUTIONS:  Other: light duty at work  WEIGHT BEARING RESTRICTIONS:  No  FALLS:  Has patient fallen in last 6 months? 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   PLOF:  Independent  PATIENT GOALS:  "Get back to work fast"  NEXT MD VISIT: 01/02/2023   OBJECTIVE  DIAGNOSTIC FINDINGS:  Left shoulder xray report from 12/05/2022:  EXAM:  Left Shoulder Radiographs - 4 views ([facility], [facility], Axillary, Scapular Y)  performed 12/05/2022   CLINICAL INFORMATION: Acute left shoulder pain   COMPARISON: Left Shoulder Radiographs - 2 views ([facility]/ER) performed  10/15/2012   FINDINGS:  Type II-III acromion.  Mild-moderate AC arthritis with joint space  narrowing, osteophyte formation, subchondral sclerosis.  The distal  clavicle, scapula, and proximal humerus are intact.  Glenohumeral and  acromioclavicular joint alignment appears normal.  No fractures or  dislocations.  Well maintained glenohumeral joint space without evidence  of degenerative joint disease.  No soft tissue swelling or joint effusion.   No destructive bony lesion is identified.   SELF- REPORTED FUNCTION FOTO score: patient did not finish/timed out (shoulder questionnaire)  OBSERVATION/INSPECTION Posture Posture (seated): forward head, rounded shoulders, slumped in sitting.  Anthropometrics Tremor: none Body composition: obese Muscle bulk: no obvious deficits Skin: WNL at left shoulder Edema: WNL at left shoulder Functional Mobility Transfers: sit <> stand WFL Gait: grossly WFL for household and short community ambulation. More detailed gait analysis deferred to later date as needed.   NEUROLOGICAL Dermatomes C3-T1 appears equal and intact to light touch except the  following: right C7 is diminished compared to left.   CERVICAL SPINE SCREEN C-spine AROM with end range OP in all directions does not reproduce concordant pain.  Cervical spine axial compression: negative Spurling's part B:  R = negative, L = negative  PERIPHERAL JOINT MOTION (in degrees) ACTIVE RANGE OF MOTION (AROM) *Indicates pain 12/20/22 Date Date  Joint/Motion R/L R/L R/L  Shoulder     Flexion 145/140 / /  Extension 50/50* / /  Abduction  168/68* / /  External rotation 77/68 / /  Internal rotation T10*/T10* / /  12/20/22: B elbow/wrist motions grossly WNL except pain with end range L elbow extension. States L shoulder flexion does not hurt as long as she moves slowly (moves very slowly during AROM testing). Significant compensatory shrug with left shoulder abduction.  MUSCLE PERFORMANCE (MMT):  *Indicates pain 12/20/22 Date Date  Joint/Motion R/L R/L R/L  Shoulder     Flexion 4/3+* / /  Abduction (C5) 4/2* / /  External rotation 4/4 / /  Internal rotation 4+/4+ / /  Elbow     Flexion (C6) 4+/4* / /  Extension (C7) 5/4+ / /  Wrist     Pronation 5/5 / /  Supination 5/5 / /  Hand     Thumb extension (C8) B WNL / /  Finger abduction (T1) B WNL / /   SPECIAL TESTS: Hawkins/Kennedy impingement test: positive Drop arm test: negative Infraspinatus test: negative Yergason's test: positive Speed's test: positive   PALPATION: TTP over left UT, posterior RTC, lateral deltoid, and especially over anterior shoulder near bicipital groove. Globally hypersensitive to palpation around left shoulder.    TREATMENT:  Therapeutic exercise: to centralize symptoms and improve ROM, strength, muscular endurance, and activity tolerance required for successful completion of functional activities.  - standing L shoulder AAROM wall slide, 1x5 flexion, 1x10 scaption,  1x2 abduction (too painful).  - standing isometric L shoulder abduction and flexion, 1x3-5 of each, 4-5 second holds.  - Education on diagnosis, prognosis, POC, anatomy and physiology of current condition.  - Education on HEP including handout   Pt required multimodal cuing for proper technique and to facilitate improved neuromuscular control, strength, range of motion, and functional ability resulting in improved performance and form.  PATIENT EDUCATION:  Education details: Exercise purpose/form. Self management techniques. Education on diagnosis, prognosis, POC, anatomy and physiology of current condition. Education on HEP including handout. Person educated: Patient Education method: Explanation, Demonstration, and Handouts Education comprehension: verbalized understanding and needs further education  HOME EXERCISE PROGRAM: Access Code: [email or web address] URL: [email or web address] Date: 12/20/2022 Prepared by: Norton Blizzard  Exercises - Shoulder Flexion Wall Slide with Towel  - 1 x daily - 20 reps - Standing Shoulder Scaption Wall Walk  - 1-2 x daily - 2 sets - 10 reps - 5 seconds hold - Standing Isometric Shoulder Abduction with Doorway - Arm Bent  - 1 x daily - 2 sets - 10 reps - 4 seconds hold - Standing Isometric Shoulder Flexion with Doorway - Arm Bent  - 1 x daily - 2 sets - 10 reps - 4 seconds hold   ASSESSMENT:  CLINICAL IMPRESSION: Patient is a 57 y.o. female referred to outpatient physical therapy with a medical diagnosis of acute pain of left shoulder, impingement syndrome of left shoulder, arthritis of left acromioclavicular joint, bursitis of left shoulder, tendinitis of left shoulder who presents with signs and symptoms consistent with subacute left shoulder pain with exam findings consistent with rotator cuff related pain (especially supraspinatus) and possible biceps tendinopathy/itis.  Patient has significant weakness and pain with left shoulder abduction with  compensatory shrug and has positive Yergason's and speed's test with painful and weak left elbow flexion suggesting supraspinatus and biceps tendon involvement. Examination was limited at times by patient's drowsiness. Patient presents with significant pain, ROM, motor control, hyperalgesia, allodynia, postural, knowledge, muscle performance (strength/power/endurance) and activity tolerance  impairments that are limiting ability to complete anything using the left UE with anything including reaching, carrying, dressing, working, sleeping without difficulty. She is currently out of work due to this injury and returning to work is patient's main goal. Patient will benefit from skilled physical therapy intervention to address current body structure impairments and activity limitations to improve function and work towards goals set in current POC in order to return to prior level of function or maximal functional improvement.   OBJECTIVE IMPAIRMENTS: decreased activity tolerance, decreased coordination, decreased endurance, decreased knowledge of condition, decreased ROM, decreased strength, impaired perceived functional ability, impaired flexibility, impaired UE functional use, improper body mechanics, postural dysfunction, obesity, and pain.   ACTIVITY LIMITATIONS: carrying, lifting, sleeping, dressing, reach over head, and hygiene/grooming  PARTICIPATION LIMITATIONS: meal prep, cleaning, laundry, interpersonal relationship, occupation, and   difficulty using the left UE with anything including reaching, carrying, dressing, working, sleeping  PERSONAL FACTORS: Fitness, Past/current experiences, Profession, Time since onset of injury/illness/exacerbation, and 3+ comorbidities:   Neck pain; Migraines; Obesity, morbid, BMI 40.0-49.9 (HCC); Diabetes mellitus type 2, uncomplicated (HCC); Bilateral occipital neuralgia; Lumbar facet joint syndrome; Chronic right-sided low back pain with right-sided sciatica; Chronic  pain syndrome; Chronic painful diabetic neuropathy (HCC); and Chronic radicular lumbar pain on  their problem list.  has a past medical history of Diabetes mellitus without complication (HCC), High cholesterol, Hypertension, Migraine headache, and Migraines.  has a past surgical history that includes Cesarean section; Knee surgery; Ankle surgery; Abdominal hysterectomy; and Wrist surgery (right) are also affecting patient's functional outcome.   REHAB POTENTIAL: Good  CLINICAL DECISION MAKING: Evolving/moderate complexity  EVALUATION COMPLEXITY: Moderate   GOALS: Goals reviewed with patient? No  SHORT TERM GOALS: Target date: 01/03/2023  Patient will be independent with initial home exercise program for self-management of symptoms. Baseline: Initial HEP provided at IE (12/20/22); Goal status: INITIAL   LONG TERM GOALS: Target date: 03/14/2023  Patient will be independent with a long-term home exercise program for self-management of symptoms.  Baseline: Initial HEP provided at IE (12/20/22); Goal status: INITIAL  2.  Patient will demonstrate improved FOTO by equal or greater than 10 points by visit #10 to demonstrate improvement in overall condition and self-reported functional ability.  Baseline: patient did not complete/timed out (12/20/22); Goal status: INITIAL  3.  Patient will demonstrate left shoulder AROM within 90% of right shoulder AROM to improve her ability to complete overhead work tasks.  Baseline: very limited, abduction especially - see objective exam (12/20/22); Goal status: INITIAL  4.  Patient will demonstrate left shoulder MMT equal or greater than 4+/5 in all directions to improve her ability to return to work. Baseline: as low as 3+/5 (12/20/22); Goal status: INITIAL  5.  Patient will demonstrate improvement in Patient Specific Functional Scale (PSFS) of equal or greater than 3 points to reflect clinically significant improvement in patient's most valued  functional activities. Baseline: to be tested at visit 2 as appropriate (12/20/22); Goal status: INITIAL     PLAN:  PT FREQUENCY: 1-2x/week  PT DURATION: 12 weeks  PLANNED INTERVENTIONS: 97164- PT Re-evaluation, 97110-Therapeutic exercises, 97530- Therapeutic activity, 97112- Neuromuscular re-education, 97535- Self Care, 81191- Manual therapy, 97014- Electrical stimulation (unattended), Patient/Family education, Dry Needling, Joint mobilization, Spinal mobilization, Cryotherapy, and Moist heat.  PLAN FOR NEXT SESSION: update HEP as appropriate, progressive UE/postural/functional strengthening/motor control/ROM exercises as tolerated, manual therapy / dry - needling as needed, education.     Cira Rue, PT, DPT 12/20/2022, 7:00 PM   [facility] [address] [place], [place] [address] P: [phone] I F: [phone]

## 2022-12-20 ENCOUNTER — Encounter: Payer: Self-pay | Admitting: Physical Therapy

## 2022-12-20 ENCOUNTER — Ambulatory Visit: Payer: BC Managed Care – PPO | Attending: Sports Medicine | Admitting: Physical Therapy

## 2022-12-20 DIAGNOSIS — M5459 Other low back pain: Secondary | ICD-10-CM | POA: Diagnosis present

## 2022-12-20 DIAGNOSIS — M6281 Muscle weakness (generalized): Secondary | ICD-10-CM | POA: Insufficient documentation

## 2022-12-20 DIAGNOSIS — M25512 Pain in left shoulder: Secondary | ICD-10-CM | POA: Insufficient documentation

## 2022-12-20 NOTE — Therapy (Unsigned)
OUTPATIENT PHYSICAL THERAPY TREATMENT   Patient Name: Michele [name] MRN: [id number] DOB:[birth year], 57 y.o., female Today's Date: 12/24/2022  END OF SESSION:  PT End of Session - 12/24/22 1524     Visit Number 18    Number of Visits 13    Date for PT Re-Evaluation 03/14/23    Authorization Type [employer] reporting period from 12/20/2022    Authorization Time Period needs auth    Authorization - Visit Number 2    Progress Note Due on Visit 10    PT Start Time 1520    PT Stop Time 1600    PT Time Calculation (min) 40 min    Activity Tolerance Patient limited by pain    Behavior During Therapy [facility] for tasks assessed/performed              Past Medical History:  Diagnosis Date   Diabetes mellitus without complication (HCC)    High cholesterol    Hypertension    Migraine headache    Migraines    Past Surgical History:  Procedure Laterality Date   ABDOMINAL HYSTERECTOMY     ANKLE SURGERY     CESAREAN SECTION     KNEE SURGERY     WRIST SURGERY     Patient Active Problem List   Diagnosis Date Noted   Chronic painful diabetic neuropathy (HCC) 10/26/2021   Chronic radicular lumbar pain 10/26/2021   Lumbar facet joint syndrome 03/21/2018   Chronic right-sided low back pain with right-sided sciatica 03/21/2018   Chronic pain syndrome 03/21/2018   Obesity, morbid, BMI 40.0-49.9 (HCC) 07/12/2017   Bilateral occipital neuralgia 09/14/2016   Neck pain 12/16/2014   Migraines 04/28/2014   Diabetes mellitus type 2, uncomplicated (HCC) 04/28/2014    PCP:  Gracelyn Nurse, MD  REFERRING PROVIDER: Dorthula Nettles, DO  REFERRING DIAG: acute pain of left shoulder, impingement syndrome of left shoulder, arthritis of left acromioclavicular joint, bursitis of left shoulder, tendinitis of left shoulder  Rationale for Evaluation and Treatment: Rehabilitation  THERAPY DIAG:  Left shoulder pain, unspecified chronicity  ONSET DATE: 11/24/2022    SUBJECTIVE:                                                                                                                                                                                            PERTINENT HISTORY:  Patient is a 57 y.o. female who presents to outpatient physical therapy with a referral for medical diagnosis acute pain of left shoulder, impingement syndrome of left shoulder, arthritis of left acromioclavicular joint, bursitis of left shoulder, tendinitis of left shoulder. This patient's chief complaints  consist of left anterior shoulder pain that radiated down to hand for the first week, leading to the following functional deficits: currently unable to work, difficulty using the left UE with anything including reaching, carrying, dressing, working, sleeping. Relevant past medical history and comorbidities include has Neck pain; Migraines; Obesity, morbid, BMI 40.0-49.9 (HCC); Diabetes mellitus type 2, uncomplicated (HCC); Bilateral occipital neuralgia; Lumbar facet joint syndrome; Chronic right-sided low back pain with right-sided sciatica; Chronic pain syndrome; Chronic painful diabetic neuropathy (HCC); and Chronic radicular lumbar pain on their problem list.  has a past medical history of Diabetes mellitus without complication (HCC), High cholesterol, Hypertension, Migraine headache, and Migraines.  has a past surgical history that includes Cesarean section; Knee surgery; Ankle surgery; Abdominal hysterectomy; and Wrist surgery (right). Patient denies hx of cancer, stroke, seizures, lung problems, heart problems, unexplained weight loss, unexplained changes in bowel or bladder problems, osteoporosis, and spinal surgery. She states she is always dropping things.  SUBJECTIVE STATEMENT: Patient states she has no pain upon arrival as long as she moves her left shoulder slowly. She states she has been successful in her HEP and has been moving her left arm all over the wall. She continues to wonder why she  cannot go back to work since she uses her right UE for most of the work. She also perplexed about why her left shoulder is the one that got injured when she barely uses it at work. She is hoping to return to work on 01/04/2023.   PAIN: NPRS: 0/10  PRECAUTIONS:  Other: light duty at work   PATIENT GOALS:  "Get back to work fast"  NEXT MD VISIT: 01/02/2023   OBJECTIVE  Vitals:   12/24/22 1534  BP: 130/70  Pulse: 86  SpO2: 98%  Manual cuff  SELF-REPORTED FUNCTION FOTO score: 67/100 (shoulder questionnaire)  Patient Specific Functional Scale (PSFS)  sleep: 8 Lift it up: 8 Straighten it out: 8 Average: 8   TREATMENT:                                                                                                                              Therapeutic exercise: to centralize symptoms and improve ROM, strength, muscular endurance, and activity tolerance required for successful completion of functional activities.  - vitals screening (see above) - standing L shoulder AAROM wall slide, 1x10 flexion, 1x5 scaption, 1x10 abduction.  - standing isometric L shoulder abduction and flexion, 1x3-5 of each, 4-5 second holds.  - standing AROM B scaption to approx 90 degrees, 1x20.  - wall walks with YTB around forearms, 1x10.  - Education on HEP including handout   Pt required multimodal cuing for proper technique and to facilitate improved neuromuscular control, strength, range of motion, and functional ability resulting in improved performance and form.  PATIENT EDUCATION:  Education details: Exercise purpose/form. Self management techniques. Education on diagnosis, prognosis, POC, anatomy and physiology of current condition. Education on HEP including handout. Person educated: Patient  Education method: Explanation, Demonstration, and Handouts Education comprehension: verbalized understanding and needs further education  HOME EXERCISE PROGRAM: Access Code: [email or web address] URL:  [email or web address] Date: 12/24/2022 Prepared by: Norton Blizzard  Exercises - Shoulder Flexion Wall Slide with Towel  - 1-2 x daily - 2 sets - 10 reps - Standing Shoulder Abduction Slides at Wall  - 1-2 x daily - 20 reps - Standing Shoulder Scaption  - 1 x daily - 3 sets - 10 reps - Forearm Walks on Wall with Resistance Band  - 1 x daily - 1 sets - 10 reps   ASSESSMENT:  CLINICAL IMPRESSION: Patient arrives reporting improvements in pain and function in the left shoulder as well as good participation in HEP. She seemed unable to fully comprehend the instructions for the Patient Specific Functional Scale, even after several attempts at explaining them to her and she was unable to stay focused on the topic despite attempts at redirection. Therefore, the score may not be valid for her, but was quite high. She continues to require heavy cuing to learn new exercises and perform them with good form, although she showed good carry over by the end of the session. She was much less drowsy today. Plan to continue with progressive exercises as tolerated. Patient reported feeling better by end of session. Patient would benefit from continued management of limiting condition by skilled physical therapist to address remaining impairments and functional limitations to work towards stated goals and return to PLOF or maximal functional independence.   From initial PT eval 12/20/2022:  Patient is a 57 y.o. female referred to outpatient physical therapy with a medical diagnosis of acute pain of left shoulder, impingement syndrome of left shoulder, arthritis of left acromioclavicular joint, bursitis of left shoulder, tendinitis of left shoulder who presents with signs and symptoms consistent with subacute left shoulder pain with exam findings consistent with rotator cuff related pain (especially supraspinatus) and possible biceps tendinopathy/itis.  Patient has significant weakness and pain with left  shoulder abduction with compensatory shrug and has positive Yergason's and speed's test with painful and weak left elbow flexion suggesting supraspinatus and biceps tendon involvement. Examination was limited at times by patient's drowsiness. Patient presents with significant pain, ROM, motor control, hyperalgesia, allodynia, postural, knowledge, muscle performance (strength/power/endurance) and activity tolerance  impairments that are limiting ability to complete anything using the left UE with anything including reaching, carrying, dressing, working, sleeping without difficulty. She is currently out of work due to this injury and returning to work is patient's main goal. Patient will benefit from skilled physical therapy intervention to address current body structure impairments and activity limitations to improve function and work towards goals set in current POC in order to return to prior level of function or maximal functional improvement.   OBJECTIVE IMPAIRMENTS: decreased activity tolerance, decreased coordination, decreased endurance, decreased knowledge of condition, decreased ROM, decreased strength, impaired perceived functional ability, impaired flexibility, impaired UE functional use, improper body mechanics, postural dysfunction, obesity, and pain.   ACTIVITY LIMITATIONS: carrying, lifting, sleeping, dressing, reach over head, and hygiene/grooming  PARTICIPATION LIMITATIONS: meal prep, cleaning, laundry, interpersonal relationship, occupation, and   difficulty using the left UE with anything including reaching, carrying, dressing, working, sleeping  PERSONAL FACTORS: Fitness, Past/current experiences, Profession, Time since onset of injury/illness/exacerbation, and 3+ comorbidities:   Neck pain; Migraines; Obesity, morbid, BMI 40.0-49.9 (HCC); Diabetes mellitus type 2, uncomplicated (HCC); Bilateral occipital neuralgia; Lumbar facet joint syndrome; Chronic right-sided low back pain with  right-sided sciatica; Chronic pain syndrome;  Chronic painful diabetic neuropathy (HCC); and Chronic radicular lumbar pain on their problem list.  has a past medical history of Diabetes mellitus without complication (HCC), High cholesterol, Hypertension, Migraine headache, and Migraines.  has a past surgical history that includes Cesarean section; Knee surgery; Ankle surgery; Abdominal hysterectomy; and Wrist surgery (right) are also affecting patient's functional outcome.   REHAB POTENTIAL: Good  CLINICAL DECISION MAKING: Evolving/moderate complexity  EVALUATION COMPLEXITY: Moderate   GOALS: Goals reviewed with patient? No  SHORT TERM GOALS: Target date: 01/03/2023  Patient will be independent with initial home exercise program for self-management of symptoms. Baseline: Initial HEP provided at IE (12/20/22); Goal status: In-progress   LONG TERM GOALS: Target date: 03/14/2023  Patient will be independent with a long-term home exercise program for self-management of symptoms.  Baseline: Initial HEP provided at IE (12/20/22); Goal status: In-progress  2.  Patient will demonstrate improved FOTO by equal or greater than 10 points by visit #10 to demonstrate improvement in overall condition and self-reported functional ability.  Baseline: patient did not complete/timed out (12/20/22); Goal status: In-progress  3.  Patient will demonstrate left shoulder AROM within 90% of right shoulder AROM to improve her ability to complete overhead work tasks.  Baseline: very limited, abduction especially - see objective exam (12/20/22); Goal status: In-progress  4.  Patient will demonstrate left shoulder MMT equal or greater than 4+/5 in all directions to improve her ability to return to work. Baseline: as low as 3+/5 (12/20/22); Goal status: In-progress  5.  Patient will demonstrate improvement in Patient Specific Functional Scale (PSFS) of equal or greater than 3 points to reflect clinically  significant improvement in patient's most valued functional activities. Baseline: to be tested at visit 2 as appropriate (12/20/22); Goal status: In-progress     PLAN:  PT FREQUENCY: 1-2x/week  PT DURATION: 12 weeks  PLANNED INTERVENTIONS: 97164- PT Re-evaluation, 97110-Therapeutic exercises, 97530- Therapeutic activity, 97112- Neuromuscular re-education, 97535- Self Care, 15176- Manual therapy, 97014- Electrical stimulation (unattended), Patient/Family education, Dry Needling, Joint mobilization, Spinal mobilization, Cryotherapy, and Moist heat.  PLAN FOR NEXT SESSION: update HEP as appropriate, progressive UE/postural/functional strengthening/motor control/ROM exercises as tolerated, manual therapy / dry - needling as needed, education.     Cira Rue, PT, DPT 12/24/2022, 9:00 PM   [facility] [address] [place], [place] [address] P: [phone] I F: [phone]

## 2022-12-24 ENCOUNTER — Encounter: Payer: Self-pay | Admitting: Physical Therapy

## 2022-12-24 ENCOUNTER — Ambulatory Visit: Payer: BC Managed Care – PPO | Admitting: Physical Therapy

## 2022-12-24 VITALS — BP 130/70 | HR 86

## 2022-12-24 DIAGNOSIS — M25512 Pain in left shoulder: Secondary | ICD-10-CM | POA: Diagnosis not present

## 2022-12-26 ENCOUNTER — Ambulatory Visit: Payer: BC Managed Care – PPO | Admitting: Physical Therapy

## 2022-12-26 ENCOUNTER — Encounter: Payer: Self-pay | Admitting: Physical Therapy

## 2022-12-26 DIAGNOSIS — M25512 Pain in left shoulder: Secondary | ICD-10-CM | POA: Diagnosis not present

## 2022-12-26 DIAGNOSIS — M6281 Muscle weakness (generalized): Secondary | ICD-10-CM

## 2022-12-26 DIAGNOSIS — M5459 Other low back pain: Secondary | ICD-10-CM

## 2022-12-26 NOTE — Therapy (Signed)
OUTPATIENT PHYSICAL THERAPY TREATMENT   Patient Name: Michele [name] MRN: [id number] DOB:1965-04-10, 57 y.o., female Today's Date: 12/26/2022  END OF SESSION:  PT End of Session - 12/26/22 1614     Visit Number 3    Number of Visits 13    Date for PT Re-Evaluation 03/14/23    Authorization Type [employer] reporting period from 12/20/2022    Authorization Time Period needs auth starting 01/04/2023    Progress Note Due on Visit 10    PT Start Time 1520    PT Stop Time 1600    PT Time Calculation (min) 40 min    Activity Tolerance Patient tolerated treatment well    Behavior During Therapy WFL for tasks assessed/performed               Past Medical History:  Diagnosis Date   Diabetes mellitus without complication (HCC)    High cholesterol    Hypertension    Migraine headache    Migraines    Past Surgical History:  Procedure Laterality Date   ABDOMINAL HYSTERECTOMY     ANKLE SURGERY     CESAREAN SECTION     KNEE SURGERY     WRIST SURGERY     Patient Active Problem List   Diagnosis Date Noted   Chronic painful diabetic neuropathy (HCC) 10/26/2021   Chronic radicular lumbar pain 10/26/2021   Lumbar facet joint syndrome 03/21/2018   Chronic right-sided low back pain with right-sided sciatica 03/21/2018   Chronic pain syndrome 03/21/2018   Obesity, morbid, BMI 40.0-49.9 (HCC) 07/12/2017   Bilateral occipital neuralgia 09/14/2016   Neck pain 12/16/2014   Migraines 04/28/2014   Diabetes mellitus type 2, uncomplicated (HCC) 04/28/2014    PCP:  Gracelyn Nurse, MD  REFERRING PROVIDER: Dorthula Nettles, DO  REFERRING DIAG: acute pain of left shoulder, impingement syndrome of left shoulder, arthritis of left acromioclavicular joint, bursitis of left shoulder, tendinitis of left shoulder  Rationale for Evaluation and Treatment: Rehabilitation  THERAPY DIAG:  Left shoulder pain, unspecified chronicity  Other low back pain  Muscle weakness  (generalized)  ONSET DATE: 11/24/2022    SUBJECTIVE:                                                                                                                                                                                           PERTINENT HISTORY:  Patient is a 57 y.o. female who presents to outpatient physical therapy with a referral for medical diagnosis acute pain of left shoulder, impingement syndrome of left shoulder, arthritis of left acromioclavicular joint, bursitis of left shoulder, tendinitis of left shoulder.  This patient's chief complaints consist of left anterior shoulder pain that radiated down to hand for the first week, leading to the following functional deficits: currently unable to work, difficulty using the left UE with anything including reaching, carrying, dressing, working, sleeping. Relevant past medical history and comorbidities include has Neck pain; Migraines; Obesity, morbid, BMI 40.0-49.9 (HCC); Diabetes mellitus type 2, uncomplicated (HCC); Bilateral occipital neuralgia; Lumbar facet joint syndrome; Chronic right-sided low back pain with right-sided sciatica; Chronic pain syndrome; Chronic painful diabetic neuropathy (HCC); and Chronic radicular lumbar pain on their problem list.  has a past medical history of Diabetes mellitus without complication (HCC), High cholesterol, Hypertension, Migraine headache, and Migraines.  has a past surgical history that includes Cesarean section; Knee surgery; Ankle surgery; Abdominal hysterectomy; and Wrist surgery (right). Patient denies hx of cancer, stroke, seizures, lung problems, heart problems, unexplained weight loss, unexplained changes in bowel or bladder problems, osteoporosis, and spinal surgery. She states she is always dropping things.  SUBJECTIVE STATEMENT: Patient states she is having no pain upon arrival. She states she felt a little tired after last PT session and the last time she remembers having pain was a little  soreness after last PT session, but she felt like she needed it. She states she has been doing her HEP and she was able to sleep on her left side last night without walking up, which is a significant improvement for her.   She is hoping to return to work on 01/04/2023.   PAIN: NPRS: 0/10  PRECAUTIONS:  Other: light duty at work   PATIENT GOALS:  "Get back to work fast"  NEXT MD VISIT: 01/02/2023   OBJECTIVE  TREATMENT:                                                                                                                              Therapeutic exercise: to centralize symptoms and improve ROM, strength, muscular endurance, and activity tolerance required for successful completion of functional activities.  - Upper body ergometer level 7 to encourage joint nutrition, warm tissue, induce analgesic effect of aerobic exercise, improve muscular strength and endurance,  and prepare for remainder of session. 5 minutes changing directions every 1 min.  - seated AROM shoulder assessment before and after mobilizations to help guide treatment: L moving much more fully and with less guarding and improved confidence compared to at initial eval at start of session, and with further improvement after manual therapy. Clinician overpressure at end range abduction feels pretty equal side to side at start of visit.  (Manual therapy - see below) - standing L shoulder AAROM wall slide, 1x10 flexion, 1x10 abduction.  - wall walks with YTB around forearms, 1x10.  - standing single arm row with left shoulder, 1x8 with GTB, 1x10 with 10# cable. Cuing for correct position.  - Education on HEP including handout    Manual therapy: to reduce pain and tissue tension, improve range of motion, neuromodulation, in  order to promote improved ability to complete functional activities. SEATED  - left shoulder  mobilization with movement: abduction with inferior glide, several reps (feels good) SUPINE - left GHJ  mobilization, caudal glide 1x30 seconds grade III, 1x40 seconds grade III-IV (feels hypomobile). Caudal glide with mob belt at approx 90 degrees abduction, grade III but with difficulty due to belt slipping (discontinued belt), caudal glide with heel of hand approx 90 degrees abduction, grade III-IV.   Pt required multimodal cuing for proper technique and to facilitate improved neuromuscular control, strength, range of motion, and functional ability resulting in improved performance and form.  PATIENT EDUCATION:  Education details: Exercise purpose/form. Self management techniques. Education on diagnosis, prognosis, POC, anatomy and physiology of current condition. Education on HEP including handout. Person educated: Patient Education method: Explanation, Demonstration, and Handouts Education comprehension: verbalized understanding and needs further education  HOME EXERCISE PROGRAM: Access Code: [email or web address] URL: [email or web address] Date: 12/26/2022 Prepared by: Norton Blizzard  Exercises - Shoulder Flexion Wall Slide with Towel  - 1-2 x daily - 2 sets - 10 reps - Standing Shoulder Abduction Slides at Wall  - 1-2 x daily - 20 reps - Standing Shoulder Scaption  - 1 x daily - 3 sets - 10 reps - Forearm Walks on Wall with Resistance Band  - 1 x daily - 1 sets - 10 reps - Standing Single Arm Shoulder Row with Anchored Resistance at Chest Height  - 3-4 x weekly - 3 sets - 1 minute rest between sets   ASSESSMENT:  CLINICAL IMPRESSION: Patient's pain and mobility was significantly improved since her last session.  GH inferior glides were performed and she responded positively with further decreased pain and increased ROM.  Her forearm walks on the wall with the resistance band were markedly easier for her than during her last session, with improved ROM and decreased fatigue.  Her other exercises were also less fatiguing than before, suggesting improved activity tolerance.  A standing  single arm row was added to her HEP to increase her shoulder girdle motor control.  Cueing was needed to increase scapular retraction.  Only light resistance was added to the rows during this session to reinforce proper technique, but resistance will be increased in future sessions to strengthen her periscapular muscles.  Pt. would benefit from skilled PT intervention to improve her ROM, strength, and motor control while decreasing pain.  From initial PT eval 12/20/2022:  Patient is a 57 y.o. female referred to outpatient physical therapy with a medical diagnosis of acute pain of left shoulder, impingement syndrome of left shoulder, arthritis of left acromioclavicular joint, bursitis of left shoulder, tendinitis of left shoulder who presents with signs and symptoms consistent with subacute left shoulder pain with exam findings consistent with rotator cuff related pain (especially supraspinatus) and possible biceps tendinopathy/itis.  Patient has significant weakness and pain with left shoulder abduction with compensatory shrug and has positive Yergason's and speed's test with painful and weak left elbow flexion suggesting supraspinatus and biceps tendon involvement. Examination was limited at times by patient's drowsiness. Patient presents with significant pain, ROM, motor control, hyperalgesia, allodynia, postural, knowledge, muscle performance (strength/power/endurance) and activity tolerance  impairments that are limiting ability to complete anything using the left UE with anything including reaching, carrying, dressing, working, sleeping without difficulty. She is currently out of work due to this injury and returning to work is patient's main goal. Patient will benefit from skilled physical therapy intervention to address current body structure impairments and activity limitations  to improve function and work towards goals set in current POC in order to return to prior level of function or maximal functional  improvement.   OBJECTIVE IMPAIRMENTS: decreased activity tolerance, decreased coordination, decreased endurance, decreased knowledge of condition, decreased ROM, decreased strength, impaired perceived functional ability, impaired flexibility, impaired UE functional use, improper body mechanics, postural dysfunction, obesity, and pain.   ACTIVITY LIMITATIONS: carrying, lifting, sleeping, dressing, reach over head, and hygiene/grooming  PARTICIPATION LIMITATIONS: meal prep, cleaning, laundry, interpersonal relationship, occupation, and   difficulty using the left UE with anything including reaching, carrying, dressing, working, sleeping  PERSONAL FACTORS: Fitness, Past/current experiences, Profession, Time since onset of injury/illness/exacerbation, and 3+ comorbidities:   Neck pain; Migraines; Obesity, morbid, BMI 40.0-49.9 (HCC); Diabetes mellitus type 2, uncomplicated (HCC); Bilateral occipital neuralgia; Lumbar facet joint syndrome; Chronic right-sided low back pain with right-sided sciatica; Chronic pain syndrome; Chronic painful diabetic neuropathy (HCC); and Chronic radicular lumbar pain on their problem list.  has a past medical history of Diabetes mellitus without complication (HCC), High cholesterol, Hypertension, Migraine headache, and Migraines.  has a past surgical history that includes Cesarean section; Knee surgery; Ankle surgery; Abdominal hysterectomy; and Wrist surgery (right) are also affecting patient's functional outcome.   REHAB POTENTIAL: Good  CLINICAL DECISION MAKING: Evolving/moderate complexity  EVALUATION COMPLEXITY: Moderate   GOALS: Goals reviewed with patient? No  SHORT TERM GOALS: Target date: 01/03/2023  Patient will be independent with initial home exercise program for self-management of symptoms. Baseline: Initial HEP provided at IE (12/20/22); Goal status: In-progress   LONG TERM GOALS: Target date: 03/14/2023  Patient will be independent with a long-term  home exercise program for self-management of symptoms.  Baseline: Initial HEP provided at IE (12/20/22); Goal status: In-progress  2.  Patient will demonstrate improved FOTO by equal or greater than 10 points by visit #10 to demonstrate improvement in overall condition and self-reported functional ability.  Baseline: patient did not complete/timed out (12/20/22); Goal status: In-progress  3.  Patient will demonstrate left shoulder AROM within 90% of right shoulder AROM to improve her ability to complete overhead work tasks.  Baseline: very limited, abduction especially - see objective exam (12/20/22); Goal status: In-progress  4.  Patient will demonstrate left shoulder MMT equal or greater than 4+/5 in all directions to improve her ability to return to work. Baseline: as low as 3+/5 (12/20/22); Goal status: In-progress  5.  Patient will demonstrate improvement in Patient Specific Functional Scale (PSFS) of equal or greater than 3 points to reflect clinically significant improvement in patient's most valued functional activities. Baseline: to be tested at visit 2 as appropriate (12/20/22); Goal status: In-progress     PLAN:  PT FREQUENCY: 1-2x/week  PT DURATION: 12 weeks  PLANNED INTERVENTIONS: 97164- PT Re-evaluation, 97110-Therapeutic exercises, 97530- Therapeutic activity, 97112- Neuromuscular re-education, 97535- Self Care, 30865- Manual therapy, 97014- Electrical stimulation (unattended), Patient/Family education, Dry Needling, Joint mobilization, Spinal mobilization, Cryotherapy, and Moist heat.  PLAN FOR NEXT SESSION: update HEP as appropriate, progressive UE/postural/functional strengthening/motor control/ROM exercises as tolerated, manual therapy / dry - needling as needed, education.     Cira Rue, PT, DPT 12/26/2022, 4:18 PM   [facility] [address] [place], [place] [address] P: [phone] I F: [phone]

## 2022-12-31 ENCOUNTER — Ambulatory Visit: Payer: BC Managed Care – PPO | Admitting: Physical Therapy

## 2022-12-31 ENCOUNTER — Telehealth: Payer: Self-pay | Admitting: Physical Therapy

## 2022-12-31 NOTE — Telephone Encounter (Addendum)
Left VM informing pt of missed PT session scheduled at 4:45pm today. Requested she call back to reschedule and/or confirm next appointment.   Michele [name]. Ilsa Iha, PT, DPT 12/31/22, 7:22 PM  Patient called back and confirmed next appointment Wed 01/02/2023 at 3:15pm. She said she thought her appointment today was actually tomorrow. There was no good way to reschedule it without her being seen 2 days in a row which is not clinically indicated.   Michele [name]. Ilsa Iha, PT, DPT 12/31/22, 7:38 PM  [facility] [address] [place], [place] [address] P: [phone] I F: [phone]

## 2023-01-02 ENCOUNTER — Ambulatory Visit: Payer: BC Managed Care – PPO | Admitting: Physical Therapy

## 2023-01-02 ENCOUNTER — Encounter: Payer: Self-pay | Admitting: Physical Therapy

## 2023-01-02 DIAGNOSIS — M5459 Other low back pain: Secondary | ICD-10-CM

## 2023-01-02 DIAGNOSIS — M25512 Pain in left shoulder: Secondary | ICD-10-CM | POA: Diagnosis not present

## 2023-01-02 DIAGNOSIS — M6281 Muscle weakness (generalized): Secondary | ICD-10-CM

## 2023-01-02 NOTE — Therapy (Cosign Needed Addendum)
OUTPATIENT PHYSICAL THERAPY TREATMENT   Michele [name] [name] [name]: [id number] [name]:[birth year], 57 y.o., female Today's Date: 01/03/2023  END OF SESSION:  PT End of Session - 01/02/23 1354     Visit Number 4    Number of Visits 13    Date for PT Re-Evaluation 03/14/23    Authorization Type [employer] reporting period from 12/20/2022    Authorization Time Period needs auth starting 01/04/2023    Progress Note Due on Visit 10    PT Start Time 1347    PT Stop Time 1425    PT Time Calculation (min) 38 min    Activity Tolerance Michele [name] tolerated treatment well    Behavior During Therapy WFL for tasks assessed/performed                Past Medical History:  Diagnosis Date   Diabetes mellitus without complication (HCC)    High cholesterol    Hypertension    Migraine headache    Migraines    Past Surgical History:  Procedure Laterality Date   ABDOMINAL HYSTERECTOMY     ANKLE SURGERY     CESAREAN SECTION     KNEE SURGERY     WRIST SURGERY     Michele [name] Active Problem List   Diagnosis Date Noted   Chronic painful diabetic neuropathy (HCC) 10/26/2021   Chronic radicular lumbar pain 10/26/2021   Lumbar facet joint syndrome 03/21/2018   Chronic right-sided low back pain with right-sided sciatica 03/21/2018   Chronic pain syndrome 03/21/2018   Obesity, morbid, BMI 40.0-49.9 (HCC) 07/12/2017   Bilateral occipital neuralgia 09/14/2016   Neck pain 12/16/2014   Migraines 04/28/2014   Diabetes mellitus type 2, uncomplicated (HCC) 04/28/2014    PCP:  Gracelyn Nurse, MD  REFERRING PROVIDER: Dorthula Nettles, DO  REFERRING DIAG: acute pain of left shoulder, impingement syndrome of left shoulder, arthritis of left acromioclavicular joint, bursitis of left shoulder, tendinitis of left shoulder  Rationale for Evaluation and Treatment: Rehabilitation  THERAPY DIAG:  Left shoulder pain, unspecified chronicity  Other low back pain  Muscle weakness  (generalized)  ONSET DATE: 11/24/2022    SUBJECTIVE:                                                                                                                                                                                           PERTINENT HISTORY:  Michele [name] is a 57 y.o. female who presents to outpatient physical therapy with a referral for medical diagnosis acute pain of left shoulder, impingement syndrome of left shoulder, arthritis of left acromioclavicular joint, bursitis of left shoulder, tendinitis of left  shoulder. This Michele [name]'s chief complaints consist of left anterior shoulder pain that radiated down to hand for the first week, leading to the following functional deficits: currently unable to work, difficulty using the left UE with anything including reaching, carrying, dressing, working, sleeping. Relevant past medical history and comorbidities include has Neck pain; Migraines; Obesity, morbid, BMI 40.0-49.9 (HCC); Diabetes mellitus type 2, uncomplicated (HCC); Bilateral occipital neuralgia; Lumbar facet joint syndrome; Chronic right-sided low back pain with right-sided sciatica; Chronic pain syndrome; Chronic painful diabetic neuropathy (HCC); and Chronic radicular lumbar pain on their problem list.  has a past medical history of Diabetes mellitus without complication (HCC), High cholesterol, Hypertension, Migraine headache, and Migraines.  has a past surgical history that includes Cesarean section; Knee surgery; Ankle surgery; Abdominal hysterectomy; and Wrist surgery (right). Michele [name] denies hx of cancer, stroke, seizures, lung problems, heart problems, unexplained weight loss, unexplained changes in bowel or bladder problems, osteoporosis, and spinal surgery. She states she is always dropping things.  SUBJECTIVE STATEMENT:  Michele [name] said she loved the manual exercises from last session.  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   HEP completed every other day.  She states she felt tightness during the wall walks, but not painful.  Shoulder feels like it's moving better.  She slept on the shoulder again   She is hoping to return to work on 01/04/2023.   PAIN: NPRS: 2/10 (because she tried to sleep on it)  PRECAUTIONS:  Other: light duty at work   Michele [name] GOALS:  "Get back to work fast"  NEXT MD VISIT: 01/02/2023   OBJECTIVE  TREATMENT:                                                                                                                              Therapeutic exercise: to centralize symptoms and improve ROM, strength, muscular endurance, and activity tolerance required for successful completion of functional activities.  - Upper body ergometer level 7 to encourage joint nutrition, warm tissue, induce analgesic effect of aerobic exercise, improve muscular strength and endurance,  and prepare for remainder of session. 5 minutes changing directions every 1 min.  - seated AROM shoulder assessment before and after mobilizations to help guide treatment: L moving much more fully and with less guarding and improved confidence compared to at initial eval at start of session, and with further improvement after manual therapy. (Manual therapy - see below) - Snow angels to check ROM; seated - WFL - Standing L shoulder AAROM wall slide, 1x10 flexion, 1x10 abduction. - Bicep curl to [facility] press - (cueing needed for technique) - 4 lbs - 2x10 - Wall walks with YTB around forearms 2x10 - Scaption - 2x10 with 4 lbs - required cueing for scapular depression. Scap elevation was noted during the eccentric portion.  Michele [name] did 2x5 second shoulder extention hold (supinated) against the wall for muslcle activation/motor control priming.  Manual therapy: to reduce pain and tissue tension, improve range of motion, neuromodulation, in order to promote improved ability to complete functional activities. SUPINE - left GHJ  mobilization, caudal glide 2x40 seconds grade IV with 30 second break at approx 90 degrees abduction. Pt said it "felt good - Resisted bicep curl in crank test position. No pain  Pt required multimodal cuing for proper technique and to facilitate improved neuromuscular control, strength, range of motion, and functional ability resulting in improved performance and form.  Michele [name] EDUCATION:  Education details: Exercise purpose/form. Self management techniques. Education on diagnosis, prognosis, POC, anatomy and physiology of current condition. Education on HEP including handout. Person educated: Michele [name] Education method: Explanation, Demonstration, and Handouts Education comprehension: verbalized understanding and needs further education  HOME EXERCISE PROGRAM: Access Code: [email or web address] URL: [email or web address] Date: 12/26/2022 Prepared by: Norton Blizzard  Exercises - Shoulder Flexion Wall Slide with Towel  - 1-2 x daily - 2 sets - 10 reps - Standing Shoulder Abduction Slides at Wall  - 1-2 x daily - 20 reps - Standing Shoulder Scaption  - 1 x daily - 3 sets - 10 reps - Forearm Walks on Wall with Resistance Band  - 1 x daily - 1 sets - 10 reps - Standing Single Arm Shoulder Row with Anchored Resistance at Chest Height  - 3-4 x weekly - 3 sets - 1 minute rest between sets   ASSESSMENT:  CLINICAL IMPRESSION: Pt is continuing to improve her strength and ROM, and her pain is improving.  She has been cleared by her physician to go back to work.  She tolerated the bicep curls well and a gross screen bicep MMT in "crank test" position was strong and painless, indicating a significant improvement from her initial evaluation.  Her forearm walks on the wall with the resistance band have been getting easier for her each session, with improved ROM and decreased fatigue.  She struggled on the [facility] press to keep her elbows straight, indicating reduced ROM in overhead position. She got to grossly  150 degrees of elevation. She required cues to fully straighten her elbows at the end of the eccentric portion of the bicep curl and had difficulty controlling the eccentric portion, indicating deficits in motor control as well as ROM.  She had challenges in the eccentric portion of the scaptions as well, with her shoulders elevating as her shoulders were going into extension. There was a slight improvement with cueing and with practicing an isometric shoulder extension with scap squeeze against the wall, but she would benefit from more motor control in her shoulder girdle and biceps.  Michele [name] will benefit from skilled physical therapy intervention to address current body structure impairments and activity limitations to improve function and work towards goals set in current POC in order to return to prior level of function or maximal functional improvement.   From initial PT eval 12/20/2022:  Michele [name] is a 57 y.o. female referred to outpatient physical therapy with a medical diagnosis of acute pain of left shoulder, impingement syndrome of left shoulder, arthritis of left acromioclavicular joint, bursitis of left shoulder, tendinitis of left shoulder who presents with signs and symptoms consistent with subacute left shoulder pain with exam findings consistent with rotator cuff related pain (especially supraspinatus) and possible biceps tendinopathy/itis.  Michele [name] has significant weakness and pain with left shoulder abduction with compensatory shrug and has positive Yergason's and speed's test with painful and weak left elbow flexion suggesting supraspinatus and biceps tendon involvement. Examination was limited  at times by Michele [name]'s drowsiness. Michele [name] presents with significant pain, ROM, motor control, hyperalgesia, allodynia, postural, knowledge, muscle performance (strength/power/endurance) and activity tolerance  impairments that are limiting ability to complete anything using the left UE with anything  including reaching, carrying, dressing, working, sleeping without difficulty. She is currently out of work due to this injury and returning to work is Michele [name]'s main goal. Michele [name] will benefit from skilled physical therapy intervention to address current body structure impairments and activity limitations to improve function and work towards goals set in current POC in order to return to prior level of function or maximal functional improvement.   OBJECTIVE IMPAIRMENTS: decreased activity tolerance, decreased coordination, decreased endurance, decreased knowledge of condition, decreased ROM, decreased strength, impaired perceived functional ability, impaired flexibility, impaired UE functional use, improper body mechanics, postural dysfunction, obesity, and pain.   ACTIVITY LIMITATIONS: carrying, lifting, sleeping, dressing, reach over head, and hygiene/grooming  PARTICIPATION LIMITATIONS: meal prep, cleaning, laundry, interpersonal relationship, occupation, and   difficulty using the left UE with anything including reaching, carrying, dressing, working, sleeping  PERSONAL FACTORS: Fitness, Past/current experiences, Profession, Time since onset of injury/illness/exacerbation, and 3+ comorbidities:   Neck pain; Migraines; Obesity, morbid, BMI 40.0-49.9 (HCC); Diabetes mellitus type 2, uncomplicated (HCC); Bilateral occipital neuralgia; Lumbar facet joint syndrome; Chronic right-sided low back pain with right-sided sciatica; Chronic pain syndrome; Chronic painful diabetic neuropathy (HCC); and Chronic radicular lumbar pain on their problem list.  has a past medical history of Diabetes mellitus without complication (HCC), High cholesterol, Hypertension, Migraine headache, and Migraines.  has a past surgical history that includes Cesarean section; Knee surgery; Ankle surgery; Abdominal hysterectomy; and Wrist surgery (right) are also affecting Michele [name]'s functional outcome.   REHAB POTENTIAL: Good  CLINICAL  DECISION MAKING: Evolving/moderate complexity  EVALUATION COMPLEXITY: Moderate   GOALS: Goals reviewed with Michele [name]? No  SHORT TERM GOALS: Target date: 01/03/2023  Michele [name] will be independent with initial home exercise program for self-management of symptoms. Baseline: Initial HEP provided at IE (12/20/22); Goal status: In-progress   LONG TERM GOALS: Target date: 03/14/2023  Michele [name] will be independent with a long-term home exercise program for self-management of symptoms.  Baseline: Initial HEP provided at IE (12/20/22); Goal status: In-progress  2.  Michele [name] will demonstrate improved FOTO by equal or greater than 10 points by visit #10 to demonstrate improvement in overall condition and self-reported functional ability.  Baseline: Michele [name] did not complete/timed out (12/20/22); Goal status: In-progress  3.  Michele [name] will demonstrate left shoulder AROM within 90% of right shoulder AROM to improve her ability to complete overhead work tasks.  Baseline: very limited, abduction especially - see objective exam (12/20/22); Goal status: In-progress  4.  Michele [name] will demonstrate left shoulder MMT equal or greater than 4+/5 in all directions to improve her ability to return to work. Baseline: as low as 3+/5 (12/20/22); Goal status: In-progress  5.  Michele [name] will demonstrate improvement in Michele [name] Specific Functional Scale (PSFS) of equal or greater than 3 points to reflect clinically significant improvement in Michele [name]'s most valued functional activities. Baseline: to be tested at visit 2 as appropriate (12/20/22); Goal status: In-progress   PLAN:  PT FREQUENCY: 1-2x/week  PT DURATION: 12 weeks  PLANNED INTERVENTIONS: 97164- PT Re-evaluation, 97110-Therapeutic exercises, 97530- Therapeutic activity, 97112- Neuromuscular re-education, 97535- Self Care, 78295- Manual therapy, 97014- Electrical stimulation (unattended), Michele [name]/Family education, Dry Needling, Joint mobilization, Spinal  mobilization, Cryotherapy, and Moist heat.  PLAN FOR NEXT SESSION: update HEP as appropriate, progressive UE/postural/functional strengthening/motor control/ROM exercises as tolerated, manual  therapy / dry - needling as needed, education.  Consider the following exercises for future sessions.  Consider: - Lateral raise (regress to isometric if needed) - Wall clocks - Resisted IR - Resisted ER - Ts, Ys, Is - Supinated lat pulldowns - Standing single arm row with left shoulder, 1x8 with GTB, 1x10 with 10# cable.   Matthew Saras, Student-PT, DPT 01/03/2023, 10:26 AM   Luretha Murphy. Ilsa Iha, PT, DPT 01/03/23, 1:56 PM  [facility] Re[facility] [address] [place], [place] [address] P: [phone] Cherylann Parr: [phone]   [facility] Rehab [address] [place], [place] [address] P: [phone] I F: [phone]

## 2023-01-08 ENCOUNTER — Ambulatory Visit: Payer: BC Managed Care – PPO | Admitting: Physical Therapy

## 2023-01-10 ENCOUNTER — Encounter: Payer: BC Managed Care – PPO | Admitting: Physical Therapy

## 2023-01-15 ENCOUNTER — Encounter: Payer: BC Managed Care – PPO | Admitting: Physical Therapy

## 2023-01-17 ENCOUNTER — Encounter: Payer: BC Managed Care – PPO | Admitting: Physical Therapy

## 2023-01-20 ENCOUNTER — Other Ambulatory Visit: Payer: Self-pay

## 2023-01-20 ENCOUNTER — Emergency Department
Admission: EM | Admit: 2023-01-20 | Discharge: 2023-01-20 | Disposition: A | Discharge status: Home / Self Care | Payer: BC Managed Care – PPO | Attending: Student in an Organized Health Care Education/Training Program | Admitting: Student in an Organized Health Care Education/Training Program

## 2023-01-20 ENCOUNTER — Encounter: Payer: Self-pay | Admitting: *Deleted

## 2023-01-20 DIAGNOSIS — M5442 Lumbago with sciatica, left side: Secondary | ICD-10-CM | POA: Insufficient documentation

## 2023-01-20 DIAGNOSIS — G8929 Other chronic pain: Secondary | ICD-10-CM | POA: Insufficient documentation

## 2023-01-20 DIAGNOSIS — M5441 Lumbago with sciatica, right side: Secondary | ICD-10-CM | POA: Diagnosis not present

## 2023-01-20 DIAGNOSIS — M545 Low back pain, unspecified: Secondary | ICD-10-CM | POA: Diagnosis present

## 2023-01-20 MED ORDER — METHOCARBAMOL 500 MG PO TABS
500.0000 mg | ORAL_TABLET | Freq: Four times a day (QID) | ORAL | 0 refills | Status: DC
Start: 2023-01-20 — End: 2023-06-14

## 2023-01-20 MED ORDER — KETOROLAC TROMETHAMINE 10 MG PO TABS: 10.0000 mg | ORAL_TABLET | Freq: Four times a day (QID) | ORAL | 0 refills | Status: DC | PRN

## 2023-01-20 MED ORDER — KETOROLAC TROMETHAMINE 30 MG/ML IJ SOLN
30.0000 mg | Freq: Once | INTRAMUSCULAR | Status: AC
  Administered 2023-01-20: 30 mg via INTRAMUSCULAR
  Filled 2023-01-20: qty 1

## 2023-01-20 MED ORDER — DEXAMETHASONE SODIUM PHOSPHATE 10 MG/ML IJ SOLN
10.0000 mg | Freq: Once | INTRAMUSCULAR | Status: AC
  Administered 2023-01-20: 10 mg via INTRAMUSCULAR
  Filled 2023-01-20: qty 1

## 2023-01-20 MED ORDER — PREDNISONE 50 MG PO TABS: 50.0000 mg | ORAL_TABLET | Freq: Every day | ORAL | 0 refills | Status: DC

## 2023-01-20 NOTE — ED Triage Notes (Signed)
Patient c/o sciatic nerve pain down both legs that started 4 days ago. Patient has taken home meds without relief.

## 2023-01-20 NOTE — ED Notes (Signed)
See triage notes. Patient c/o sciatic nerve pain times four days. Patient reported taking a muscle relaxer at home with no relief.

## 2023-01-20 NOTE — ED Provider Notes (Signed)
[facility] Provider Note  Patient Contact: 3:22 PM (approximate)   History   Fall   HPI  Michele [name] is a 57 y.o. female who presents the emergency department complaining of low back pain with bilateral sciatica.  Patient has a longstanding history of chronic back issues with sciatica.  She typically receives injections epidurally for symptom relief.  Patient states that due to her work schedule she has not been recently for another injection.  No new trauma, no atypical symptoms.  Patient has no bowel or bladder dysfunction, saddle anesthesia or paresthesias.  No urinary or GI changes.     Physical Exam   Triage Vital Signs: ED Triage Vitals  Encounter Vitals Group     BP 01/20/23 1429 (!) 128/90     Systolic BP Percentile --      Diastolic BP Percentile --      Pulse Rate 01/20/23 1429 73     Resp 01/20/23 1429 16     Temp 01/20/23 1429 98.9 F (37.2 C)     Temp Source 01/20/23 1429 Oral     SpO2 01/20/23 1429 97 %     Weight 01/20/23 1427 240 lb (108.9 kg)     Height 01/20/23 1427 5\' 6"  (1.676 m)     Head Circumference --      Peak Flow --      Pain Score 01/20/23 1427 10     Pain Loc --      Pain Education --      Exclude from Growth Chart --     Most recent vital signs: Vitals:   01/20/23 1429  BP: (!) 128/90  Pulse: 73  Resp: 16  Temp: 98.9 F (37.2 C)  SpO2: 97%     General: Alert and in no acute distress.  Cardiovascular:  Good peripheral perfusion Respiratory: Normal respiratory effort without tachypnea or retractions. Lungs CTAB. Good air entry to the bases with no decreased or absent breath sounds. Gastrointestinal: Bowel sounds 4 quadrants. Soft and nontender to palpation. No guarding or rigidity. No palpable masses. No distention. No CVA tenderness. Musculoskeletal: Full range of motion to all extremities.  Tenderness in the lumbar spine diffusely both midline and bilaterally.  No palpable abnormality or step-off.   Patient has tenderness over both SI joints into the sciatic notches.  Pulses and sensation intact and equal bilateral lower extremities. Neurologic:  No gross focal neurologic deficits are appreciated.  Skin:   No rash noted Other:   ED Results / Procedures / Treatments   Labs (all labs ordered are listed, but only abnormal results are displayed) Labs Reviewed - No data to display   EKG     RADIOLOGY    No results found.  PROCEDURES:  Critical Care performed: No  Procedures   MEDICATIONS ORDERED IN ED: Medications  ketorolac (TORADOL) 30 MG/ML injection 30 mg (has no administration in time range)  dexamethasone (DECADRON) injection 10 mg (has no administration in time range)     IMPRESSION / MDM / ASSESSMENT AND PLAN / ED COURSE  I reviewed the triage vital signs and the nursing notes.                                 Differential diagnosis includes, but is not limited to, sciatica, cauda equina, muscle spasm, herniated disc   Patient's presentation is most consistent with acute presentation with potential threat to  life or bodily function.   Patient's diagnosis is consistent with low back pain with bilateral sciatica.  Patient presents with worsening of a chronic issue.  Patient has longstanding history of lumbar spine issues with sciatica.  She typically receives epidural injections states that she has not had time recently to go for another injection.  She is having increased sciatica.  No recent trauma.  No concerning neurodeficits.  At this time I do not feel that any additional imaging is warranted.  Patient will be treated symptomatically with dexamethasone and Toradol injections here.  Prednisone, Toradol, muscle relaxer to be prescribed at home.  Follow-up with her physiatrist or orthopedist for epidural steroid injections as needed.. Patient is given ED precautions to return to the ED for any worsening or new symptoms.     FINAL CLINICAL IMPRESSION(S) /  ED DIAGNOSES   Final diagnoses:  Chronic midline low back pain with bilateral sciatica     Rx / DC Orders   ED Discharge Orders          Ordered    ketorolac (TORADOL) 10 MG tablet  Every 6 hours PRN        01/20/23 1528    predniSONE (DELTASONE) 50 MG tablet  Daily with breakfast        01/20/23 1528    methocarbamol (ROBAXIN) 500 MG tablet  4 times daily        01/20/23 1528             Note:  This document was prepared using Dragon voice recognition software and may include unintentional dictation errors.   Lanette Hampshire 01/20/23 1528    Willy Eddy, MD 01/20/23 1640

## 2023-01-22 ENCOUNTER — Ambulatory Visit: Payer: BC Managed Care – PPO | Attending: Sports Medicine | Admitting: Physical Therapy

## 2023-01-22 ENCOUNTER — Telehealth: Payer: Self-pay | Admitting: Physical Therapy

## 2023-01-22 NOTE — Telephone Encounter (Signed)
Called patient when she did not show up for her PT appointment scheduled at 9:45am today. Left VM notifying patient about missed appointment, cancellation policy, and requesting a call back to reschedule or confirm next appointment.   Luretha Murphy. Ilsa Iha, PT, DPT 01/22/23, 10:57 AM  [facility] Physical & Sports Rehab [address] [place], [place] [address] P: [phone] I F: [phone]

## 2023-01-24 ENCOUNTER — Encounter: Payer: BC Managed Care – PPO | Admitting: Physical Therapy

## 2023-01-28 ENCOUNTER — Telehealth: Payer: Self-pay | Admitting: Physical Therapy

## 2023-01-28 ENCOUNTER — Encounter: Payer: BC Managed Care – PPO | Admitting: Physical Therapy

## 2023-01-28 ENCOUNTER — Ambulatory Visit: Payer: BC Managed Care – PPO | Admitting: Physical Therapy

## 2023-01-28 NOTE — Telephone Encounter (Signed)
Left VM notifying patient of 2nd PT no-show today at 9am and that we are removing her from the schedule per our cancellation policy. Invited her to call back to reschedule if she wanted to continue PT.   Luretha Murphy. Ilsa Iha, PT, DPT 01/28/23, 9:26 AM  [facility] Physical & Sports Rehab [address] [place], [place] [address] P: [phone] I F: [phone]

## 2023-01-30 ENCOUNTER — Encounter: Payer: BC Managed Care – PPO | Admitting: Physical Therapy

## 2023-02-05 ENCOUNTER — Encounter: Payer: BC Managed Care – PPO | Admitting: Physical Therapy

## 2023-02-07 ENCOUNTER — Encounter: Payer: BC Managed Care – PPO | Admitting: Physical Therapy

## 2023-02-11 ENCOUNTER — Encounter: Payer: BC Managed Care – PPO | Admitting: Physical Therapy

## 2023-02-13 ENCOUNTER — Encounter: Payer: BC Managed Care – PPO | Admitting: Physical Therapy

## 2023-02-17 ENCOUNTER — Emergency Department: Payer: BC Managed Care – PPO

## 2023-02-17 ENCOUNTER — Emergency Department
Admission: EM | Admit: 2023-02-17 | Discharge: 2023-02-17 | Disposition: A | Discharge status: Home / Self Care | Payer: BC Managed Care – PPO | Attending: Emergency Medicine | Admitting: Emergency Medicine

## 2023-02-17 DIAGNOSIS — E876 Hypokalemia: Secondary | ICD-10-CM | POA: Diagnosis not present

## 2023-02-17 DIAGNOSIS — Z1152 Encounter for screening for COVID-19: Secondary | ICD-10-CM | POA: Diagnosis not present

## 2023-02-17 DIAGNOSIS — R42 Dizziness and giddiness: Secondary | ICD-10-CM | POA: Diagnosis not present

## 2023-02-17 DIAGNOSIS — J019 Acute sinusitis, unspecified: Secondary | ICD-10-CM | POA: Insufficient documentation

## 2023-02-17 DIAGNOSIS — R0602 Shortness of breath: Secondary | ICD-10-CM | POA: Diagnosis present

## 2023-02-17 LAB — CBC WITH DIFFERENTIAL/PLATELET
Abs Immature Granulocytes: 0.03 10*3/uL (ref 0.00–0.07)
Basophils Absolute: 0 10*3/uL (ref 0.0–0.1)
Basophils Relative: 1 %
Eosinophils Absolute: 0.2 10*3/uL (ref 0.0–0.5)
Eosinophils Relative: 2 %
HCT: 38.9 % (ref 36.0–46.0)
Hemoglobin: 12.7 g/dL (ref 12.0–15.0)
Immature Granulocytes: 0 %
Lymphocytes Relative: 45 %
Lymphs Abs: 3 10*3/uL (ref 0.7–4.0)
MCH: 26.5 pg (ref 26.0–34.0)
MCHC: 32.6 g/dL (ref 30.0–36.0)
MCV: 81 fL (ref 80.0–100.0)
Monocytes Absolute: 0.7 10*3/uL (ref 0.1–1.0)
Monocytes Relative: 10 %
Neutro Abs: 2.8 10*3/uL (ref 1.7–7.7)
Neutrophils Relative %: 42 %
Platelets: 316 10*3/uL (ref 150–400)
RBC: 4.8 MIL/uL (ref 3.87–5.11)
RDW: 13.2 % (ref 11.5–15.5)
WBC: 6.7 10*3/uL (ref 4.0–10.5)
nRBC: 0 % (ref 0.0–0.2)

## 2023-02-17 LAB — TROPONIN I (HIGH SENSITIVITY): Troponin I (High Sensitivity): 3 ng/L (ref <18)

## 2023-02-17 LAB — COMPREHENSIVE METABOLIC PANEL
ALT: 17 U/L (ref 0–44)
AST: 19 U/L (ref 15–41)
Albumin: 3.4 g/dL — ABNORMAL LOW (ref 3.5–5.0)
Alkaline Phosphatase: 74 U/L (ref 38–126)
Anion gap: 5 (ref 5–15)
BUN: 15 mg/dL (ref 6–20)
CO2: 27 mmol/L (ref 22–32)
Calcium: 8.8 mg/dL — ABNORMAL LOW (ref 8.9–10.3)
Chloride: 106 mmol/L (ref 98–111)
Creatinine, Ser: 0.79 mg/dL (ref 0.44–1.00)
GFR, Estimated: >60 mL/min (ref >60)
Glucose, Bld: 106 mg/dL — ABNORMAL HIGH (ref 70–99)
Potassium: 3.1 mmol/L — ABNORMAL LOW (ref 3.5–5.1)
Sodium: 138 mmol/L (ref 135–145)
Total Bilirubin: 0.5 mg/dL (ref <1.2)
Total Protein: 6.7 g/dL (ref 6.5–8.1)

## 2023-02-17 LAB — SARS CORONAVIRUS 2 BY RT PCR: SARS Coronavirus 2 by RT PCR: NEGATIVE

## 2023-02-17 MED ORDER — AMOXICILLIN-POT CLAVULANATE 875-125 MG PO TABS
1.0000 | ORAL_TABLET | Freq: Two times a day (BID) | ORAL | 0 refills | Status: AC
Start: 2023-02-17 — End: 2023-02-27

## 2023-02-17 MED ORDER — MECLIZINE HCL 25 MG PO TABS: 25.0000 mg | ORAL_TABLET | Freq: Three times a day (TID) | ORAL | 0 refills | Status: DC | PRN

## 2023-02-17 MED ORDER — SODIUM CHLORIDE 0.9 % IV BOLUS
1000.0000 mL | Freq: Once | INTRAVENOUS | Status: AC
  Administered 2023-02-17: 1000 mL via INTRAVENOUS

## 2023-02-17 MED ORDER — ONDANSETRON HCL 4 MG/2ML IJ SOLN
4.0000 mg | Freq: Once | INTRAMUSCULAR | Status: AC
  Administered 2023-02-17: 4 mg via INTRAVENOUS
  Filled 2023-02-17: qty 2

## 2023-02-17 MED ORDER — MECLIZINE HCL 25 MG PO TABS
25.0000 mg | ORAL_TABLET | Freq: Once | ORAL | Status: AC
  Administered 2023-02-17: 25 mg via ORAL
  Filled 2023-02-17: qty 1

## 2023-02-17 MED ORDER — POTASSIUM CHLORIDE CRYS ER 20 MEQ PO TBCR
40.0000 meq | EXTENDED_RELEASE_TABLET | Freq: Once | ORAL | Status: AC
  Administered 2023-02-17: 40 meq via ORAL
  Filled 2023-02-17: qty 2

## 2023-02-17 NOTE — ED Provider Notes (Addendum)
[facility] Provider Note    Event Date/Time   First MD Initiated Contact with Patient 02/17/23 1719     (approximate)   History   URI   HPI  Michele [name] is a 57 y.o. female who comes in with URI symptoms for the past 2 weeks.  Was negative for COVID, flu.  Patient reports having vomiting this morning.  Patient reports that she feels that there is fluid behind her left ear and that she is a lot of pressure in her face for the past 2 weeks.  She reports she has had significant amount of congestion with mucus coming out of her nose.  She reports some shortness of breath.  Denies any abdominal pain but does report having some constipation.  She reports that she has been feeling more off with ambulation.  She denies any dizziness with laying down but it is more like when she stands up that she feels off balance and swimmy headed.  She does report history of vertigo that she is on meclizine for but does not have any currently.  She reports feeling this way for the past week with the infection. No urinary symptoms   Physical Exam   Triage Vital Signs: ED Triage Vitals  Encounter Vitals Group     BP 02/17/23 1645 (!) 143/108     Systolic BP Percentile --      Diastolic BP Percentile --      Pulse Rate 02/17/23 1642 77     Resp 02/17/23 1642 16     Temp 02/17/23 1642 98.2 F (36.8 C)     Temp Source 02/17/23 1642 Oral     SpO2 02/17/23 1642 100 %     Weight --      Height --      Head Circumference --      Peak Flow --      Pain Score 02/17/23 1642 0     Pain Loc --      Pain Education --      Exclude from Growth Chart --     Most recent vital signs: Vitals:   02/17/23 1642 02/17/23 1645  BP:  (!) 143/108  Pulse: 77   Resp: 16   Temp: 98.2 F (36.8 C)   SpO2: 100%      General: Awake, no distress.  CV:  Good peripheral perfusion.  Resp:  Normal effort.  Abd:  No distention. Soft and non tender  Other:  Finger-to-nose intact bilaterally.   No nystagmus noted.  Cranial nerves are intact. TM clear   ED Results / Procedures / Treatments   Labs (all labs ordered are listed, but only abnormal results are displayed) Labs Reviewed  COMPREHENSIVE METABOLIC PANEL - Abnormal; Notable for the following components:      Result Value   Potassium 3.1 (*)    Glucose, Bld 106 (*)    Calcium 8.8 (*)    Albumin 3.4 (*)    All other components within normal limits  SARS CORONAVIRUS 2 BY RT PCR  CBC WITH DIFFERENTIAL/PLATELET  URINALYSIS, ROUTINE W REFLEX MICROSCOPIC     EKG  My interpretation of EKG:  Normal sinus rate of 68 without any ST elevation, T wave version lead III, normal intervals  RADIOLOGY I have reviewed the xray personally and interpretted and no PNA    PROCEDURES:  Critical Care performed: No  Procedures   MEDICATIONS ORDERED IN ED: Medications  meclizine (ANTIVERT) tablet 25 mg (  has no administration in time range)  sodium chloride 0.9 % bolus 1,000 mL (has no administration in time range)  ondansetron (ZOFRAN) injection 4 mg (has no administration in time range)  potassium chloride SA (KLOR-CON M) CR tablet 40 mEq (has no administration in time range)     IMPRESSION / MDM / ASSESSMENT AND PLAN / ED COURSE  I reviewed the triage vital signs and the nursing notes.   Patient's presentation is most consistent with acute presentation with potential threat to life or bodily function.   Given patient's congestion with nasal discharge and sinus pressure ongoing for 2 weeks I suspect that this is sinusitis it is increasing her vertigo. Given her BP will get CT head to rule out ICH but this seems less likely.  I can see the possibility of posterior stroke but her finger-nose is intact and given the above this seems more consistent with sinus infection and vertigo.  This is also been ongoing for over a week so would be out of the window for any type of treatment.  Patient reports that her symptoms are worse  with standing up so we will give some fluids, meclizine, Zofran.  CMP shows slightly low potassium.  CBC reassuring.  Troponin negative  Patient reports feeling significantly improved with medications.  Her CT head was negative does show some signs of paranasal sinus disease which I suspect is contributing to her vertigo, sinusitis.  We discussed her empty sella but on prior MRI in 2022 she has had that previously and she can follow-up with ophthalmology for evaluation for papilledema  Patient ambulates and feels much improved.  At this time she feels comfortable with discharge home.  The patient is on the cardiac monitor to evaluate for evidence of arrhythmia and/or significant heart rate changes.      FINAL CLINICAL IMPRESSION(S) / ED DIAGNOSES   Final diagnoses:  Acute non-recurrent sinusitis, unspecified location  Vertigo     Rx / DC Orders   ED Discharge Orders          Ordered    amoxicillin-clavulanate (AUGMENTIN) 875-125 MG tablet  2 times daily        02/17/23 2116    meclizine (ANTIVERT) 25 MG tablet  3 times daily PRN        02/17/23 2116             Note:  This document was prepared using Dragon voice recognition software and may include unintentional dictation errors.   Concha Se, MD 02/17/23 2116    Concha Se, MD 02/17/23 2117

## 2023-02-17 NOTE — ED Triage Notes (Signed)
URI symptoms x 2 weeks.  Seen by nurse at work for symptoms, tested negative for Covid and Flu.  STates balance off. Vomiting this morning.  Denies fevers.

## 2023-02-17 NOTE — Discharge Instructions (Addendum)
We are starting you on antibiotics for sinusitis as well as some antivertigo medication.  Stay well-hydrated and return to the ER if you develop worsening symptoms or any other concerns.

## 2023-02-18 ENCOUNTER — Encounter: Payer: BC Managed Care – PPO | Admitting: Physical Therapy

## 2023-06-14 ENCOUNTER — Emergency Department

## 2023-06-14 ENCOUNTER — Emergency Department
Admission: EM | Admit: 2023-06-14 | Discharge: 2023-06-14 | Disposition: A | Discharge status: Home / Self Care | Attending: Emergency Medicine | Admitting: Emergency Medicine

## 2023-06-14 ENCOUNTER — Encounter: Payer: Self-pay | Admitting: Emergency Medicine

## 2023-06-14 ENCOUNTER — Other Ambulatory Visit: Payer: Self-pay

## 2023-06-14 DIAGNOSIS — G8929 Other chronic pain: Secondary | ICD-10-CM | POA: Diagnosis not present

## 2023-06-14 DIAGNOSIS — M25512 Pain in left shoulder: Secondary | ICD-10-CM | POA: Insufficient documentation

## 2023-06-14 DIAGNOSIS — E119 Type 2 diabetes mellitus without complications: Secondary | ICD-10-CM | POA: Diagnosis not present

## 2023-06-14 LAB — CBG MONITORING, ED: Glucose-Capillary: 86 mg/dL (ref 70–99)

## 2023-06-14 MED ORDER — DEXAMETHASONE SODIUM PHOSPHATE 10 MG/ML IJ SOLN
10.0000 mg | Freq: Once | INTRAMUSCULAR | Status: AC
  Administered 2023-06-14: 10 mg via INTRAMUSCULAR
  Filled 2023-06-14: qty 1

## 2023-06-14 MED ORDER — HYDROCODONE-ACETAMINOPHEN 5-325 MG PO TABS: 1.0000 | ORAL_TABLET | Freq: Every evening | ORAL | 0 refills | Status: DC | PRN

## 2023-06-14 MED ORDER — PREDNISONE 10 MG PO TABS: ORAL_TABLET | ORAL | 0 refills | Status: DC

## 2023-06-14 NOTE — ED Triage Notes (Signed)
 Pt via POV from home. Pt c/o L shoulder pain for the past 8 months, states that is has worsen for the last 2 months. Reports she does not do much heavy lifting. Worse with movement. Pt is A&OX4 and NAD, ambulatory to triage.

## 2023-06-14 NOTE — ED Provider Notes (Signed)
 [facility] Provider Note    Event Date/Time   First MD Initiated Contact with Patient 06/14/23 [phone]     (approximate)   History   Shoulder Pain   HPI  Michele [name] is a 58 y.o. female   presents to the ED with complaint of left shoulder pain for the last 8 months.  Patient states that pain has worsened in the last 2 months but denies any recent injury.  She does report that she does repetitive movements with lifting items above her head at work.  She has been seen at the orthopedic department at [facility] and also had physical therapy which she states did not help.  Currently she has been taking some over-the-counter medications without any relief of her pain.  Patient has history of diabetes type 2, chronic musculoskeletal pain involving the lower back, diabetic neuropathy and chronic pain syndrome.      Physical Exam   Triage Vital Signs: ED Triage Vitals  Encounter Vitals Group     BP 06/14/23 0741 135/89     Systolic BP Percentile --      Diastolic BP Percentile --      Pulse Rate 06/14/23 0741 76     Resp 06/14/23 0741 18     Temp 06/14/23 0741 98.4 F (36.9 C)     Temp Source 06/14/23 0741 Oral     SpO2 06/14/23 0741 100 %     Weight 06/14/23 0740 239 lb (108.4 kg)     Height 06/14/23 0740 5\' 6"  (1.676 m)     Head Circumference --      Peak Flow --      Pain Score 06/14/23 0740 10     Pain Loc --      Pain Education --      Exclude from Growth Chart --     Most recent vital signs: Vitals:   06/14/23 0741  BP: 135/89  Pulse: 76  Resp: 18  Temp: 98.4 F (36.9 C)  SpO2: 100%     General: Awake, no distress.  CV:  Good peripheral perfusion.  Resp:  Normal effort.  Abd:  No distention.  Other:  On examination of the left shoulder there is no gross deformity however range of motion is moderately restricted secondary to discomfort and patient's tolerance.  Abduction is limited to approximately 45 degrees.  No crepitus was  noted.  Moderate tenderness on palpation of the AC joint and surrounding tissue.  No erythema or warmth is noted.  Skin is intact.  Pulses are present distally.   ED Results / Procedures / Treatments   Labs (all labs ordered are listed, but only abnormal results are displayed) Labs Reviewed  CBG MONITORING, ED     RADIOLOGY Left Shoulder x-ray images were reviewed and interpreted by myself independent of the radiologist and degenerative changes were noted at the [facility] joint.  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    PROCEDURES:  Critical Care performed:   Procedures   MEDICATIONS ORDERED IN ED: Medications  dexamethasone (DECADRON) injection 10 mg (10 mg Intramuscular Given 06/14/23 1016)     IMPRESSION / MDM / ASSESSMENT AND PLAN / ED COURSE  I reviewed the triage vital signs and the nursing notes.   Differential diagnosis includes, but is not limited to, chronic shoulder pain, bursitis, tendinitis, degenerative joint disease.  58 year old female presents to the ED with complaint of left shoulder pain for 8 months.  She has been seen at [facility] orthopedic department and also has had physical therapy.  No new injury has occurred.  Left shoulder x-ray images were reviewed and patient was made aware that if still shows degenerative changes at the [facility] joint.  Patient is a type II diabetic and nonfasting glucose in the ED was 86.  I discussed with her an injection of Decadron along with a 5-day course of prednisone.  During this time she is to monitor what she is eating as the prednisone could increase her glucose levels.  A prescription for hydrocodone was sent to the pharmacy to take at bedtime as she states that she was unable to sleep on her left side without pain.  Ice or heat as needed to the area.  She is also strongly encouraged to follow-up with the orthopedic department at [facility] as she states that her shoulder  is not improved.  She was also given a note to remain out of work.      Patient's presentation is most consistent with exacerbation of chronic illness.  FINAL CLINICAL IMPRESSION(S) / ED DIAGNOSES   Final diagnoses:  Chronic left shoulder pain     Rx / DC Orders   ED Discharge Orders          Ordered    predniSONE (DELTASONE) 10 MG tablet        06/14/23 1002    HYDROcodone-acetaminophen (NORCO/VICODIN) 5-325 MG tablet  At bedtime PRN        06/14/23 1002             Note:  This document was prepared using Dragon voice recognition software and may include unintentional dictation errors.   Tommi Rumps, PA-C 06/14/23 1029    Sharyn Creamer, MD 06/14/23 [phone]

## 2023-06-14 NOTE — Discharge Instructions (Addendum)
 Follow-up with the [facility] orthopedic department for follow-up of your continued shoulder pain.  In the emergency department you received an injection of Decadron which is a steroid.  Tomorrow start with the prednisone once a day for the next 5 days.  This is 3 tablets each morning with food.  This may temporarily increase your blood sugar so be mindful of your diet and carbohydrate intake.  Your blood sugar while in the emergency department was 86.  Also a prescription for hydrocodone was sent to the pharmacy for you to take at bedtime to see if this helps with sleeping.  You may also use ice or heat to your shoulder as needed for discomfort.  You may continue using the over-the-counter lidocaine patches.

## 2023-07-23 ENCOUNTER — Other Ambulatory Visit: Payer: Self-pay

## 2023-07-23 DIAGNOSIS — R42 Dizziness and giddiness: Secondary | ICD-10-CM | POA: Insufficient documentation

## 2023-07-23 DIAGNOSIS — I10 Essential (primary) hypertension: Secondary | ICD-10-CM | POA: Diagnosis not present

## 2023-07-23 DIAGNOSIS — E119 Type 2 diabetes mellitus without complications: Secondary | ICD-10-CM | POA: Insufficient documentation

## 2023-07-23 LAB — CBC WITH DIFFERENTIAL/PLATELET
Abs Immature Granulocytes: 0.03 10*3/uL (ref 0.00–0.07)
Basophils Absolute: 0.1 10*3/uL (ref 0.0–0.1)
Basophils Relative: 1 %
Eosinophils Absolute: 0.3 10*3/uL (ref 0.0–0.5)
Eosinophils Relative: 3 %
HCT: 39.9 % (ref 36.0–46.0)
Hemoglobin: 13.2 g/dL (ref 12.0–15.0)
Immature Granulocytes: 0 %
Lymphocytes Relative: 42 %
Lymphs Abs: 4.1 10*3/uL — ABNORMAL HIGH (ref 0.7–4.0)
MCH: 26.5 pg (ref 26.0–34.0)
MCHC: 33.1 g/dL (ref 30.0–36.0)
MCV: 80 fL (ref 80.0–100.0)
Monocytes Absolute: 0.5 10*3/uL (ref 0.1–1.0)
Monocytes Relative: 5 %
Neutro Abs: 4.8 10*3/uL (ref 1.7–7.7)
Neutrophils Relative %: 49 %
Platelets: 414 10*3/uL — ABNORMAL HIGH (ref 150–400)
RBC: 4.99 MIL/uL (ref 3.87–5.11)
RDW: 13.2 % (ref 11.5–15.5)
WBC: 9.9 10*3/uL (ref 4.0–10.5)
nRBC: 0 % (ref 0.0–0.2)

## 2023-07-23 LAB — COMPREHENSIVE METABOLIC PANEL WITH GFR
ALT: 13 U/L (ref 0–44)
AST: 16 U/L (ref 15–41)
Albumin: 3.9 g/dL (ref 3.5–5.0)
Alkaline Phosphatase: 62 U/L (ref 38–126)
Anion gap: 14 (ref 5–15)
BUN: 12 mg/dL (ref 6–20)
CO2: 25 mmol/L (ref 22–32)
Calcium: 9.3 mg/dL (ref 8.9–10.3)
Chloride: 102 mmol/L (ref 98–111)
Creatinine, Ser: 1.08 mg/dL — ABNORMAL HIGH (ref 0.44–1.00)
GFR, Estimated: >60 mL/min (ref >60)
Glucose, Bld: 74 mg/dL (ref 70–99)
Potassium: 3.2 mmol/L — ABNORMAL LOW (ref 3.5–5.1)
Sodium: 141 mmol/L (ref 135–145)
Total Bilirubin: 0.6 mg/dL (ref 0.0–1.2)
Total Protein: 6.9 g/dL (ref 6.5–8.1)

## 2023-07-23 NOTE — Group Note (Deleted)
 Date:  07/23/2023 Time:  9:45 PM  Group Topic/Focus:  Wrap-Up Group:   The focus of this group is to help patients review their daily goal of treatment and discuss progress on daily workbooks.     Participation Level:  {BHH PARTICIPATION EAVWU:98119}  Participation Quality:  {BHH PARTICIPATION QUALITY:22265}  Affect:  {BHH AFFECT:22266}  Cognitive:  {BHH COGNITIVE:22267}  Insight: {BHH Insight2:20797}  Engagement in Group:  {BHH ENGAGEMENT IN JYNWG:95621}  Modes of Intervention:  {BHH MODES OF INTERVENTION:22269}  Additional Comments:  ***  Maglione,Preciosa Bundrick E 07/23/2023, 9:45 PM

## 2023-07-23 NOTE — ED Triage Notes (Signed)
 POV with CC of dizziness and lightheadedness that has been ongoing x3 days with associated nausea. Hx of vertigo with meclizine  prescription - took this am and had no relief. A&Ox4.

## 2023-07-24 ENCOUNTER — Emergency Department
Admission: EM | Admit: 2023-07-24 | Discharge: 2023-07-24 | Disposition: A | Discharge status: Home / Self Care | Attending: Emergency Medicine | Admitting: Emergency Medicine

## 2023-07-24 ENCOUNTER — Emergency Department

## 2023-07-24 DIAGNOSIS — R42 Dizziness and giddiness: Secondary | ICD-10-CM

## 2023-07-24 LAB — URINALYSIS, ROUTINE W REFLEX MICROSCOPIC
Bilirubin Urine: NEGATIVE
Glucose, UA: NEGATIVE mg/dL
Hgb urine dipstick: NEGATIVE
Ketones, ur: NEGATIVE mg/dL
Leukocytes,Ua: NEGATIVE
Nitrite: NEGATIVE
Protein, ur: NEGATIVE mg/dL
Specific Gravity, Urine: 1.016 (ref 1.005–1.030)
pH: 6 (ref 5.0–8.0)

## 2023-07-24 MED ORDER — POTASSIUM CHLORIDE CRYS ER 20 MEQ PO TBCR
40.0000 meq | EXTENDED_RELEASE_TABLET | Freq: Once | ORAL | Status: AC
  Administered 2023-07-24: 40 meq via ORAL
  Filled 2023-07-24: qty 2

## 2023-07-24 MED ORDER — IOHEXOL 350 MG/ML SOLN
75.0000 mL | Freq: Once | INTRAVENOUS | Status: AC | PRN
Start: 2023-07-24 — End: 2023-07-24
  Administered 2023-07-24: 75 mL via INTRAVENOUS

## 2023-07-24 MED ORDER — MECLIZINE HCL 25 MG PO TABS
25.0000 mg | ORAL_TABLET | Freq: Once | ORAL | Status: AC
  Administered 2023-07-24: 25 mg via ORAL
  Filled 2023-07-24: qty 1

## 2023-07-24 MED ORDER — SODIUM CHLORIDE 0.9 % IV BOLUS
1000.0000 mL | Freq: Once | INTRAVENOUS | Status: AC
  Administered 2023-07-24: 1000 mL via INTRAVENOUS

## 2023-07-24 NOTE — ED Notes (Signed)
 Pt returned from MRI - dizziness continues. NS fluids resumed.

## 2023-07-24 NOTE — ED Notes (Signed)
 Patient transported to MRI

## 2023-07-24 NOTE — ED Provider Notes (Signed)
 [facility] Provider Note    Event Date/Time   First MD Initiated Contact with Patient 07/24/23 0016     (approximate)   History   Dizziness   HPI  Michele [name] is a 57 y.o. female   Past medical history of diabetes, hypertension hyperlipidemia, migraine headaches, chronic pain, and longstanding vertigo for which she takes Antivert  who presents to the emergency department with dizziness consistent with her vertiginous symptoms at baseline but unusual and that it happens at rest now and typically happens with movement, and it has not resolved with her meclizine  medication.  Symptoms started on Sunday morning around 10 AM while at work.  She states she has a sensation of the room spinning around her.  No trauma.  No recent illnesses.  She did have her upper teeth removed at the beginning of the month and has residual pain in her mouth from then.  No fevers no chills.  No airway issues and that she has no difficulty with swallowing or breathing.  She has been eating / drinking a lot less since her dental procedure.   External Medical Documents Reviewed: Outpatient notes from internal medicine visit in January 25      Physical Exam   Triage Vital Signs: ED Triage Vitals  Encounter Vitals Group     BP 07/23/23 2053 (!) 145/85     Systolic BP Percentile --      Diastolic BP Percentile --      Pulse Rate 07/23/23 2053 82     Resp 07/23/23 2053 18     Temp 07/23/23 2053 98.2 F (36.8 C)     Temp Source 07/23/23 2053 Oral     SpO2 07/23/23 2053 98 %     Weight 07/23/23 2054 235 lb (106.6 kg)     Height 07/23/23 2053 5\' 6"  (1.676 m)     Head Circumference --      Peak Flow --      Pain Score 07/23/23 2053 0     Pain Loc --      Pain Education --      Exclude from Growth Chart --     Most recent vital signs: Vitals:   07/24/23 0056 07/24/23 0459  BP: (!) 148/86 (!) 142/89  Pulse: 80 65  Resp: 18 17  Temp: 98.1 F (36.7 C) 98.3 F (36.8 C)   SpO2: 98% 100%    General: Awake, no distress.  CV:  Good peripheral perfusion.  Resp:  Normal effort.  Abd:  No distention.  Other:  Resting comfortably with slight hypertension otherwise vital signs normal.  She has some very slight facial asymmetry on the left upper part as her eyebrows raise asymmetrically, weaker on the left side.  This is very mild and there is no discernible difference in symmetry in the lower part of her face.  Sensation intact.  Motor intact.  When she stands she feels very lightheaded, dizzy, room spinning has to close her eyes and starts to sway back-and-forth and sits down immediately.  Intraoral teeth are missing on the upper, consistent with her teeth pulled at the earlier part of this month.  There is no obvious abscess.  Her airway is intact.   ED Results / Procedures / Treatments   Labs (all labs ordered are listed, but only abnormal results are displayed) Labs Reviewed  COMPREHENSIVE METABOLIC PANEL WITH GFR - Abnormal; Notable for the following components:      Result Value  Potassium 3.2 (*)    Creatinine, Ser 1.08 (*)    All other components within normal limits  URINALYSIS, ROUTINE W REFLEX MICROSCOPIC - Abnormal; Notable for the following components:   Color, Urine YELLOW (*)    APPearance HAZY (*)    All other components within normal limits  CBC WITH DIFFERENTIAL/PLATELET - Abnormal; Notable for the following components:   Platelets 414 (*)    Lymphs Abs 4.1 (*)    All other components within normal limits     I ordered and reviewed the above labs they are notable for calcium slightly low at 3.2.  EKG  ED ECG REPORT I, Buell Carmin, the attending physician, personally viewed and interpreted this ECG.   Date: 07/24/2023  EKG Time: 2055  Rate: 82  Rhythm: sinus  Axis: nl  Intervals:nl  ST&T Change: no stemi    RADIOLOGY I independently reviewed and interpreted CT of the head and see no bleeding or midline shift I also  reviewed radiologist's formal read.   PROCEDURES:  Critical Care performed: No  Procedures   MEDICATIONS ORDERED IN ED: Medications  potassium chloride  SA (KLOR-CON  M) CR tablet 40 mEq (40 mEq Oral Given 07/24/23 0036)  sodium chloride  0.9 % bolus 1,000 mL (1,000 mLs Intravenous New Bag/Given 07/24/23 0221)  meclizine  (ANTIVERT ) tablet 25 mg (25 mg Oral Given 07/24/23 0219)  iohexol  (OMNIPAQUE ) 350 MG/ML injection 75 mL (75 mLs Intravenous Contrast Given 07/24/23 0311)    IMPRESSION / MDM / ASSESSMENT AND PLAN / ED COURSE  I reviewed the triage vital signs and the nursing notes.                                Patient's presentation is most consistent with acute presentation with potential threat to life or bodily function.  Differential diagnosis includes, but is not limited to, peripheral versus central vertigo, stroke, ICH, dehydration or metabolic derangements, considered but less likely dental or brain abscess infection   The patient is on the cardiac monitor to evaluate for evidence of arrhythmia and/or significant heart rate changes.  MDM:    She has vertiginous symptoms and longstanding history of vertigo but this is unusual and that is more pronounced, even at rest, and did not go with her usual meclizine .  I considered stroke in the posterior circulation but she is well outside of thrombolytic window will proceed with stroke imaging of CT angiogram of the head and neck as well as a MRI of the brain.  We will give her some fluids as she may be dehydrated with her recent dental infection but there is no obvious abscess or intraoral infection that I can appreciate on my clinical exam nor does she have any sign of airway obstruction, mentation changes, fevers or leukocytosis to suggest abscess or neurologic infection.  I considered hospitalization for admission or observation however given that her workup was unremarkable with no acute findings on CT angiogram nor MRI of brain  and history of vertigo in the past with similar symptoms today, I think she can be discharged with ENT follow-up.  She will follow-up with primary doctor as well.  Return with new or worsening symptoms.        FINAL CLINICAL IMPRESSION(S) / ED DIAGNOSES   Final diagnoses:  Vertigo     Rx / DC Orders   ED Discharge Orders     None  Note:  This document was prepared using Dragon voice recognition software and may include unintentional dictation errors.    Buell Carmin, MD 07/24/23 [phone]

## 2023-07-24 NOTE — Discharge Instructions (Addendum)
 Dr. Silvestre Drum is a ear nose and throat doctor who can specialize in the area of vertigo.  Give him a call for an appointment.  Take meclizine  as prescribed.  Drink plenty of fluids to stay well-hydrated.  Thank you for choosing us  for your health care today!  Please see your primary doctor this week for a follow up appointment.   If you have any new, worsening, or unexpected symptoms call your doctor right away or come back to the emergency department for reevaluation.  It was my pleasure to care for you today.   Arron Large Margery Sheets, MD

## 2023-07-26 ENCOUNTER — Other Ambulatory Visit: Payer: Self-pay | Admitting: Internal Medicine

## 2023-07-26 DIAGNOSIS — Z1231 Encounter for screening mammogram for malignant neoplasm of breast: Secondary | ICD-10-CM

## 2023-08-05 ENCOUNTER — Observation Stay

## 2023-08-05 ENCOUNTER — Emergency Department

## 2023-08-05 ENCOUNTER — Other Ambulatory Visit: Payer: Self-pay

## 2023-08-05 ENCOUNTER — Observation Stay
Admission: EM | Admit: 2023-08-05 | Discharge: 2023-08-06 | Disposition: A | Discharge status: Home / Self Care | Attending: Osteopathic Medicine | Admitting: Osteopathic Medicine

## 2023-08-05 DIAGNOSIS — Z6841 Body Mass Index (BMI) 40.0 and over, adult: Secondary | ICD-10-CM | POA: Insufficient documentation

## 2023-08-05 DIAGNOSIS — Z79899 Other long term (current) drug therapy: Secondary | ICD-10-CM | POA: Diagnosis not present

## 2023-08-05 DIAGNOSIS — R4701 Aphasia: Principal | ICD-10-CM

## 2023-08-05 DIAGNOSIS — Z794 Long term (current) use of insulin: Secondary | ICD-10-CM | POA: Insufficient documentation

## 2023-08-05 DIAGNOSIS — E1142 Type 2 diabetes mellitus with diabetic polyneuropathy: Secondary | ICD-10-CM | POA: Insufficient documentation

## 2023-08-05 DIAGNOSIS — G609 Hereditary and idiopathic neuropathy, unspecified: Secondary | ICD-10-CM | POA: Insufficient documentation

## 2023-08-05 DIAGNOSIS — E66813 Obesity, class 3: Secondary | ICD-10-CM | POA: Insufficient documentation

## 2023-08-05 DIAGNOSIS — R202 Paresthesia of skin: Secondary | ICD-10-CM | POA: Diagnosis not present

## 2023-08-05 DIAGNOSIS — I10 Essential (primary) hypertension: Secondary | ICD-10-CM | POA: Insufficient documentation

## 2023-08-05 DIAGNOSIS — Z7984 Long term (current) use of oral hypoglycemic drugs: Secondary | ICD-10-CM | POA: Insufficient documentation

## 2023-08-05 DIAGNOSIS — G459 Transient cerebral ischemic attack, unspecified: Principal | ICD-10-CM

## 2023-08-05 DIAGNOSIS — M542 Cervicalgia: Secondary | ICD-10-CM

## 2023-08-05 LAB — COMPREHENSIVE METABOLIC PANEL WITH GFR
ALT: 14 U/L (ref 0–44)
AST: 14 U/L — ABNORMAL LOW (ref 15–41)
Albumin: 3.5 g/dL (ref 3.5–5.0)
Alkaline Phosphatase: 64 U/L (ref 38–126)
Anion gap: 7 (ref 5–15)
BUN: 18 mg/dL (ref 6–20)
CO2: 29 mmol/L (ref 22–32)
Calcium: 9 mg/dL (ref 8.9–10.3)
Chloride: 105 mmol/L (ref 98–111)
Creatinine, Ser: 0.88 mg/dL (ref 0.44–1.00)
GFR, Estimated: >60 mL/min (ref >60)
Glucose, Bld: 101 mg/dL — ABNORMAL HIGH (ref 70–99)
Potassium: 3.1 mmol/L — ABNORMAL LOW (ref 3.5–5.1)
Sodium: 141 mmol/L (ref 135–145)
Total Bilirubin: 0.3 mg/dL (ref 0.0–1.2)
Total Protein: 6.1 g/dL — ABNORMAL LOW (ref 6.5–8.1)

## 2023-08-05 LAB — APTT: aPTT: 28 s (ref 24–36)

## 2023-08-05 LAB — GLUCOSE, CAPILLARY
Glucose-Capillary: 63 mg/dL — ABNORMAL LOW (ref 70–99)
Glucose-Capillary: 130 mg/dL — ABNORMAL HIGH (ref 70–99)
Glucose-Capillary: 131 mg/dL — ABNORMAL HIGH (ref 70–99)
Glucose-Capillary: 126 mg/dL — ABNORMAL HIGH (ref 70–99)

## 2023-08-05 LAB — TROPONIN I (HIGH SENSITIVITY)
Troponin I (High Sensitivity): 3 ng/L (ref <18)
Troponin I (High Sensitivity): 3 ng/L (ref <18)

## 2023-08-05 LAB — DIFFERENTIAL
Abs Immature Granulocytes: 0.02 10*3/uL (ref 0.00–0.07)
Basophils Absolute: 0.1 10*3/uL (ref 0.0–0.1)
Basophils Relative: 1 %
Eosinophils Absolute: 0.2 10*3/uL (ref 0.0–0.5)
Eosinophils Relative: 3 %
Immature Granulocytes: 0 %
Lymphocytes Relative: 40 %
Lymphs Abs: 3.1 10*3/uL (ref 0.7–4.0)
Monocytes Absolute: 0.5 10*3/uL (ref 0.1–1.0)
Monocytes Relative: 7 %
Neutro Abs: 3.8 10*3/uL (ref 1.7–7.7)
Neutrophils Relative %: 49 %

## 2023-08-05 LAB — CBC
HCT: 37.9 % (ref 36.0–46.0)
Hemoglobin: 12.3 g/dL (ref 12.0–15.0)
MCH: 26.5 pg (ref 26.0–34.0)
MCHC: 32.5 g/dL (ref 30.0–36.0)
MCV: 81.5 fL (ref 80.0–100.0)
Platelets: 287 10*3/uL (ref 150–400)
RBC: 4.65 MIL/uL (ref 3.87–5.11)
RDW: 13.5 % (ref 11.5–15.5)
WBC: 7.7 10*3/uL (ref 4.0–10.5)
nRBC: 0 % (ref 0.0–0.2)

## 2023-08-05 LAB — HEMOGLOBIN A1C
Hgb A1c MFr Bld: 5.8 % — ABNORMAL HIGH (ref 4.8–5.6)
Mean Plasma Glucose: 119.76 mg/dL

## 2023-08-05 LAB — HIV ANTIBODY (ROUTINE TESTING W REFLEX): HIV Screen 4th Generation wRfx: NONREACTIVE

## 2023-08-05 LAB — ETHANOL: Alcohol, Ethyl (B): <15 mg/dL (ref <15)

## 2023-08-05 LAB — PROTIME-INR
INR: 1 (ref 0.8–1.2)
Prothrombin Time: 12.9 s (ref 11.4–15.2)

## 2023-08-05 LAB — CBG MONITORING, ED: Glucose-Capillary: 125 mg/dL — ABNORMAL HIGH (ref 70–99)

## 2023-08-05 LAB — MAGNESIUM: Magnesium: 2.1 mg/dL (ref 1.7–2.4)

## 2023-08-05 MED ORDER — SUCRALFATE 1 G PO TABS: 1.0000 g | ORAL_TABLET | Freq: Four times a day (QID) | ORAL | Status: DC

## 2023-08-05 MED ORDER — ATORVASTATIN CALCIUM 20 MG PO TABS
20.0000 mg | ORAL_TABLET | Freq: Every day | ORAL | Status: DC
  Administered 2023-08-05: 20 mg via ORAL
  Filled 2023-08-05: qty 1

## 2023-08-05 MED ORDER — HYDRALAZINE HCL 20 MG/ML IJ SOLN: 5.0000 mg | Freq: Four times a day (QID) | INTRAMUSCULAR | Status: DC | PRN

## 2023-08-05 MED ORDER — IOHEXOL 350 MG/ML SOLN
100.0000 mL | Freq: Once | INTRAVENOUS | Status: AC | PRN
  Administered 2023-08-05: 100 mL via INTRAVENOUS

## 2023-08-05 MED ORDER — MECLIZINE HCL 25 MG PO TABS
25.0000 mg | ORAL_TABLET | Freq: Three times a day (TID) | ORAL | Status: DC | PRN
  Administered 2023-08-06: 25 mg via ORAL
  Filled 2023-08-05 (×2): qty 1

## 2023-08-05 MED ORDER — PREGABALIN 50 MG PO CAPS: 100.0000 mg | ORAL_CAPSULE | Freq: Every day | ORAL | Status: DC

## 2023-08-05 MED ORDER — ACETAMINOPHEN 650 MG RE SUPP: 650.0000 mg | RECTAL | Status: DC | PRN

## 2023-08-05 MED ORDER — PANTOPRAZOLE SODIUM 20 MG PO TBEC
20.0000 mg | DELAYED_RELEASE_TABLET | Freq: Every day | ORAL | Status: DC
  Administered 2023-08-05 – 2023-08-06 (×2): 20 mg via ORAL
  Filled 2023-08-05 (×2): qty 1

## 2023-08-05 MED ORDER — STROKE: EARLY STAGES OF RECOVERY BOOK: Freq: Once | Status: AC

## 2023-08-05 MED ORDER — HYDROCODONE-ACETAMINOPHEN 5-325 MG PO TABS: 1.0000 | ORAL_TABLET | Freq: Every evening | ORAL | Status: DC | PRN

## 2023-08-05 MED ORDER — ACETAMINOPHEN 160 MG/5ML PO SOLN: 650.0000 mg | ORAL | Status: DC | PRN

## 2023-08-05 MED ORDER — ENOXAPARIN SODIUM 40 MG/0.4ML IJ SOSY
40.0000 mg | PREFILLED_SYRINGE | INTRAMUSCULAR | Status: DC
  Administered 2023-08-05 – 2023-08-06 (×2): 40 mg via SUBCUTANEOUS
  Filled 2023-08-05 (×2): qty 0.4

## 2023-08-05 MED ORDER — SODIUM CHLORIDE 0.9% FLUSH: 3.0000 mL | INTRAVENOUS | Status: DC | PRN

## 2023-08-05 MED ORDER — INSULIN ASPART 100 UNIT/ML IJ SOLN: 0.0000 [IU] | Freq: Every day | INTRAMUSCULAR | Status: DC

## 2023-08-05 MED ORDER — ACETAMINOPHEN 325 MG PO TABS
650.0000 mg | ORAL_TABLET | ORAL | Status: DC | PRN
  Administered 2023-08-05: 650 mg via ORAL
  Filled 2023-08-05: qty 2

## 2023-08-05 MED ORDER — POTASSIUM CHLORIDE CRYS ER 20 MEQ PO TBCR
40.0000 meq | EXTENDED_RELEASE_TABLET | Freq: Once | ORAL | Status: AC
  Administered 2023-08-05: 40 meq via ORAL
  Filled 2023-08-05: qty 2

## 2023-08-05 MED ORDER — METOCLOPRAMIDE HCL 5 MG/ML IJ SOLN
10.0000 mg | Freq: Once | INTRAMUSCULAR | Status: AC
  Administered 2023-08-05: 10 mg via INTRAVENOUS
  Filled 2023-08-05: qty 2

## 2023-08-05 MED ORDER — LORAZEPAM 2 MG/ML IJ SOLN
0.5000 mg | INTRAMUSCULAR | Status: DC | PRN
  Administered 2023-08-05: 0.5 mg via INTRAVENOUS
  Filled 2023-08-05: qty 1

## 2023-08-05 MED ORDER — SODIUM CHLORIDE 0.9% FLUSH
3.0000 mL | Freq: Two times a day (BID) | INTRAVENOUS | Status: DC
  Administered 2023-08-05: 3 mL via INTRAVENOUS
  Administered 2023-08-05 – 2023-08-06 (×2): 10 mL via INTRAVENOUS

## 2023-08-05 MED ORDER — ASPIRIN 81 MG PO TBEC
81.0000 mg | DELAYED_RELEASE_TABLET | Freq: Every day | ORAL | Status: DC
  Administered 2023-08-05 – 2023-08-06 (×2): 81 mg via ORAL
  Filled 2023-08-05 (×2): qty 1

## 2023-08-05 MED ORDER — SENNOSIDES-DOCUSATE SODIUM 8.6-50 MG PO TABS: 1.0000 | ORAL_TABLET | Freq: Every evening | ORAL | Status: DC | PRN

## 2023-08-05 MED ORDER — TOPIRAMATE 100 MG PO TABS
100.0000 mg | ORAL_TABLET | Freq: Two times a day (BID) | ORAL | Status: DC
  Administered 2023-08-05 – 2023-08-06 (×2): 100 mg via ORAL
  Filled 2023-08-05: qty 4
  Filled 2023-08-05: qty 1
  Filled 2023-08-05: qty 4
  Filled 2023-08-05 (×2): qty 1

## 2023-08-05 MED ORDER — BUTALBITAL-APAP-CAFFEINE 50-325-40 MG PO TABS
1.0000 | ORAL_TABLET | Freq: Four times a day (QID) | ORAL | Status: DC | PRN
  Administered 2023-08-05 – 2023-08-06 (×3): 1 via ORAL
  Filled 2023-08-05 (×3): qty 1

## 2023-08-05 MED ORDER — DIPHENHYDRAMINE HCL 50 MG/ML IJ SOLN
25.0000 mg | Freq: Once | INTRAMUSCULAR | Status: AC
  Administered 2023-08-05: 25 mg via INTRAVENOUS
  Filled 2023-08-05: qty 1

## 2023-08-05 MED ORDER — AMITRIPTYLINE HCL 50 MG PO TABS
50.0000 mg | ORAL_TABLET | Freq: Every day | ORAL | Status: DC
  Administered 2023-08-05: 50 mg via ORAL
  Filled 2023-08-05: qty 1
  Filled 2023-08-05: qty 2
  Filled 2023-08-05: qty 1

## 2023-08-05 MED ORDER — INSULIN ASPART 100 UNIT/ML IJ SOLN
0.0000 [IU] | Freq: Three times a day (TID) | INTRAMUSCULAR | Status: DC
  Administered 2023-08-05 – 2023-08-06 (×2): 2 [IU] via SUBCUTANEOUS
  Filled 2023-08-05 (×2): qty 1

## 2023-08-05 NOTE — Progress Notes (Signed)
 64 - Code Stroke Activated, Patient in CT   LKWT 2300. mRS 0. Patient woke at 0400 with L neck & arm pain, L arm & leg weakness, HA, & aphasia  0547 - Tele-Neurologist paged   [phone] - Dr. Assunta Lax joined stroke cart   [phone] - Dr. Assunta Lax made aware of NCCT results  No TNK due to LKWT > 4.5 hours  Proceeding with advanced imaging   0615 - Patient returned to room   [phone] - CTA & CTP results sent to Dr. Assunta Lax

## 2023-08-05 NOTE — Progress Notes (Signed)
 OT Cancellation Note  Patient Details Name: Michele [name] MRN: [id number] DOB: [birth year]   Cancelled Treatment:    Reason Eval/Treat Not Completed: Patient at procedure or test/ unavailable. Pt out of the room upon attempt. Will re-attempt OT evaluation at later time as pt is available.   Wylie Russon R., MPH, MS, OTR/L ascom [phone] 08/05/23, 12:26 PM

## 2023-08-05 NOTE — Consult Note (Addendum)
 [facility] Consult Services   Patient Name:   Michele [name], Michele [name] Date of Birth:   04-21-1965 Identification Number:   MRN - [id number] Date of Service:   08/05/2023 05:47:52  Diagnosis:       I63.89 - Cerebrovascular accident (CVA) due to other mechanism (HCCC)       R51.9 - Headache, unspecified  Impression:      64F with HTN, HLD, DM and migraines who presents to the ED with left face, arm, and leg weakness associated with neck pain and word finding difficulties with speech changes for which a stroke alert was activated. She is outside of the window for thrombolytics. On my review of CTA/CTP I do not see evidence of an LVO or perfusion defect. Neurology will follow up official radiology read.    If no evidence of LVO, recommend admission for stroke work up. Other considerations for her presentation would include complicated migraine and C spine localization though C spine pathology would not explain her speech abnormality or facial numbness. Recommend starting aspirin 81 mg for now and treat with migraine cocktail for symptomatic relief of headache.  Our recommendations are outlined below.  Recommendations:        Stroke/Telemetry Floor       Neuro Checks (Q4)       Bedside Swallow Eval       DVT Prophylaxis       IV Fluids, Normal Saline       Head of Bed 30 Degrees       Euglycemia and Avoid Hyperthermia (PRN Acetaminophen )       Initiate or continue Aspirin 81 MG daily       Antihypertensives PRN if Blood pressure is greater than 220/120 or there is a concern for End organ damage/contraindications for permissive HTN. If blood pressure is greater than 220/120 give labetalol PO or IV or Vasotec IV with a goal of 15% reduction in BP during the first 24 hours.  Sign Out:       Discussed with Emergency Department Provider    ------------------------------------------------------------------------------ Advanced Imaging:  CTA Head and Neck  Completed.  CTP Completed.  LVO:No   Metrics: Last Known Well: 08/04/2023 23:00:00 Dispatch Time: 08/05/2023 05:47:52 Arrival Time: 08/05/2023 05:31:00 Initial Response Time: 08/05/2023 05:49:23 Symptoms: L arm pain, neck pain, aphasia . Initial patient interaction: 08/05/2023 05:55:00 NIHSS Assessment Completed: 08/05/2023 06:01:59 Patient is not a candidate for Thrombolytic. Thrombolytic Medical Decision: 08/05/2023 06:01:59 Patient was not deemed candidate for Thrombolytic because of following reasons: LKW outside 4.5 hr window. .  CT Head: I personally reviewed all the CT images that were available to me and it showed: no acute abnormality. No acute hemorrhage.  Primary Provider Notified of Diagnostic Impression and Management Plan on: 08/05/2023 25:36:64    ------------------------------------------------------------------------------  History of Present Illness: Patient is a 58 year old Female.  Patient was brought by EMS for symptoms of L arm pain, neck pain, aphasia . 64F with a history of HTN, HLD, DM and migraine who presents to the ED after waking up with left sided neck pain radiating down her back, L arm pain, and left face, upper and lower extremity sensory deficit, left leg weakness with word finding difficulty at 0400. She was last normal last night before going to bed at 2300.  After CT scan patient complained of onset of a migraine with severe headache and photophobia. She continued to have left sided sensory deficit and weakness as well as word finding difficulty which was  fluctuating. Family at bedside states her speech is not normal.     Past Medical History:      Hypertension      Diabetes Mellitus      Hyperlipidemia      Migraine Headaches      There is no history of Atrial Fibrillation      There is no history of Coronary Artery Disease      There is no history of Stroke  Medications:  No Anticoagulant use  No Antiplatelet use Reviewed EMR  for current medications Other Medications Pertinent To Assessment Include: Topamax 100 mg BID, Lyrica , Elavil  Lipitor  Allergies:  Reviewed,NKDA  Social History: Smoking: No Alcohol Use: Yes  Family History:  There is no family history of premature cerebrovascular disease pertinent to this consultation  ROS : 14 Points Review of Systems was performed and was negative except mentioned in HPI.  Past Surgical History: There Is No Surgical History Contributory To Today's Visit There Is Surgical History of:  Wrist surgery, knee surgery, C section, Ankle Section, Hysterectomy    Examination: BP(153/89), Pulse(70), Blood Glucose(125) 1A: Level of Consciousness - Alert; keenly responsive + 0 1B: Ask Month and Age - Both Questions Right + 0 1C: Blink Eyes & Squeeze Hands - Performs Both Tasks + 0 2: Test Horizontal Extraocular Movements - Normal + 0 3: Test Visual Fields - No Visual Loss + 0 4: Test Facial Palsy (Use Grimace if Obtunded) - Normal symmetry + 0 5A: Test Left Arm Motor Drift - No Drift for 10 Seconds + 0 5B: Test Right Arm Motor Drift - No Drift for 10 Seconds + 0 6A: Test Left Leg Motor Drift - Drift - Drift, hits bed + 2 6B: Test Right Leg Motor No Drift for 5 Seconds + 0 7: Test Limb Ataxia (FNF/Heel-Shin) - No Ataxia + 0 8: Test Sensation - Mild-Moderate Loss: Less Sharp/More Dull + 1 9: Test Language/Aphasia - Mild-Moderate Aphasia: Some Obvious Changes, Without Significant Limitation + 1 10: Test Dysarthria - Normal + 0 11: Test Extinction/Inattention - No abnormality + 0  NIHSS Score: 4  NIHSS Free Text : Slow to answer questions but eventually able to answer all questions appropriately with several attempts to mouth words prior to getting htem out. LUE tremor, no drift. LLE drifts and hits bed. Decreased sensation in the left face, arm, and leg.  Pre-Morbid Modified Rankin Scale: 0 Points = No symptoms at all  Spoke with : Dr. Demetrios Finders  This consult was  conducted in real time using interactive audio and Immunologist. Patient was informed of the technology being used for this visit and agreed to proceed. Patient located in hospital and provider located at home/office setting.   Patient is being evaluated for possible acute neurologic impairment and high probability of imminent or life-threatening deterioration. I spent total of 45 minutes providing care to this patient, including time for face to face visit via telemedicine, review of medical records, imaging studies and discussion of findings with providers, the patient and/or family.   Dr Rolanda Clever   [facility] For Inpatient follow-up with [facility] physician please call [facility] at [phone]. As we are not an outpatient service for any post hospital discharge needs please contact the hospital for assistance. If you have any questions for the [facility] physicians or need to reconsult for clinical or diagnostic changes please contact us  via [facility] at [phone].

## 2023-08-05 NOTE — H&P (Signed)
 History and Physical    Rheagan [name] ZOX:[id number] DOB: 1965-04-21 DOA: 08/05/2023  PCP: Little Riff, MD (Confirm with patient/family/NH records and if not entered, this has to be entered at [facility] point of entry) Patient coming from: Home  I have personally briefly reviewed patient's old medical records in [facility]  Chief Complaint:   HPI: Michele [name] is a 58 y.o. female with medical history significant of IDDM, HTN, HLD, migraines headache, presented with strokelike symptoms.  Patient went to bed as usual around 11 PM, woke up around 4 AM this morning with new onset of left-sided face left arm and left leg weakness associated with neck pain and difficulty finding words.  Came to ED, code stroke was triggered and patient symptoms subsided during ED stay.  CT head without contrast and CTA showed no acute findings and no LVO.  Later during the ED stay however patient again developed left-sided weakness and at the time I saw her she reported improvement of left-sided strength but still weaker than her baseline.   Review of Systems: As per HPI otherwise 14 point review of systems negative.    Past Medical History:  Diagnosis Date   Diabetes mellitus without complication (HCC)    High cholesterol    Hypertension    Migraine headache    Migraines     Past Surgical History:  Procedure Laterality Date   ABDOMINAL HYSTERECTOMY     ANKLE SURGERY     CESAREAN SECTION     KNEE SURGERY     WRIST SURGERY       reports that she has never smoked. Her smokeless tobacco use includes snuff. She reports current alcohol use. She reports that she does not use drugs.  No Known Allergies  Family History  Problem Relation Age of Onset   Breast cancer Neg Hx      Prior to Admission medications   Medication Sig Start Date End Date Taking? Authorizing Provider  baclofen  (LIORESAL ) 20 MG tablet Take 20 mg by mouth 3 (three) times daily. 07/26/23 08/25/23 Yes [provider]  diclofenac  Sodium (VOLTAREN ) 1 % GEL Apply 2 g topically 4 (four) times daily. 11/22/22 11/22/23 Yes [provider]  fluticasone (FLONASE) 50 MCG/ACT nasal spray Place 1 spray into both nostrils daily. 05/30/23  Yes [provider]  gabapentin  (NEURONTIN ) 300 MG capsule Take 1 capsule by mouth 2 (two) times daily. 07/26/23  Yes [provider]  ibuprofen  (ADVIL ) 800 MG tablet Take 800 mg by mouth 3 (three) times daily. 06/03/23  Yes [provider]  Semaglutide, 1 MG/DOSE, 4 MG/3ML SOPN Inject 1 mg into the skin once a week. 07/22/23  Yes [provider]  amitriptyline  (ELAVIL ) 50 MG tablet Take 50 mg by mouth at bedtime. 12/09/18   [provider]  atorvastatin (LIPITOR) 20 MG tablet Take 20 mg by mouth daily at 6 PM.    [provider]  glipiZIDE (GLUCOTROL XL) 10 MG 24 hr tablet Take 10 mg by mouth daily. 03/02/22   [provider]  HYDROcodone -acetaminophen  (NORCO/VICODIN) 5-325 MG tablet Take 1 tablet by mouth at bedtime as needed. 06/14/23 06/13/24  Stafford Eagles, PA-C  insulin  glargine (LANTUS) 100 UNIT/ML injection Inject 18 Units into the skin at bedtime.  Patient not taking: Reported on 03/21/2022    [provider]  lidocaine  (LIDODERM ) 5 % Place 1 patch onto the skin every 12 (twelve) hours. Remove & Discard patch within 12 hours or  as directed by MD 12/16/22 12/16/23  Twilla Galea, MD  meclizine  (ANTIVERT ) 25 MG tablet Take 1 tablet (25 mg total) by mouth 3 (three) times daily as needed for dizziness. 02/17/23   Lubertha Rush, MD  metFORMIN  (GLUCOPHAGE -XR) 500 MG 24 hr tablet Take 1,000 mg by mouth every evening. 02/05/22   [provider]  pantoprazole  (PROTONIX ) 20 MG tablet Take 1 tablet (20 mg total) by mouth daily. 05/13/19 10/26/21  Bryson Carbine, MD  predniSONE  (DELTASONE ) 10 MG tablet Take 3 tablets once a day for 5 days 06/14/23   Stafford Eagles, PA-C  pregabalin  (LYRICA ) 50  MG capsule Take 1 capsule (50 mg total) by mouth at bedtime for 15 days, THEN 2 capsules (100 mg total) at bedtime. 10/26/21 12/10/21  Cephus Collin, MD  SEMGLEE, YFGN, 100 UNIT/ML Pen Inject 22 Units into the skin at bedtime. 05/22/21   [provider]  sucralfate  (CARAFATE ) 1 g tablet Take 1 tablet (1 g total) by mouth 4 (four) times daily for 15 days. 05/13/19 10/26/21  Bryson Carbine, MD  topiramate (TOPAMAX) 100 MG tablet Take 100 mg by mouth 2 (two) times daily.    [provider]  triamterene-hydrochlorothiazide (MAXZIDE-25) 37.5-25 MG per tablet Take 1 tablet by mouth daily.    [provider]  verapamil (CALAN-SR) 180 MG CR tablet Take 180 mg by mouth daily. 11/18/21   [provider]    Physical Exam: Vitals:   08/05/23 0634 08/05/23 0637 08/05/23 0730 08/05/23 0800  BP:  124/73 (!) 141/74 (!) 131/92  Pulse:  73 76 71  Resp:  20 16 17   Temp: 97.9 F (36.6 C)     TempSrc: Axillary     SpO2:  100% 100% 100%  Weight:        Constitutional: NAD, calm, comfortable Vitals:   08/05/23 0634 08/05/23 0637 08/05/23 0730 08/05/23 0800  BP:  124/73 (!) 141/74 (!) 131/92  Pulse:  73 76 71  Resp:  20 16 17   Temp: 97.9 F (36.6 C)     TempSrc: Axillary     SpO2:  100% 100% 100%  Weight:       Eyes: PERRL, lids and conjunctivae normal ENMT: Mucous membranes are moist. Posterior pharynx clear of any exudate or lesions.Normal dentition.  Neck: normal, supple, no masses, no thyromegaly Respiratory: clear to auscultation bilaterally, no wheezing, no crackles. Normal respiratory effort. No accessory muscle use.  Cardiovascular: Regular rate and rhythm, no murmurs / rubs / gallops. No extremity edema. 2+ pedal pulses. No carotid bruits.  Abdomen: no tenderness, no masses palpated. No hepatosplenomegaly. Bowel sounds positive.  Musculoskeletal: no clubbing / cyanosis. No joint deformity upper and lower extremities. Good ROM, no contractures. Normal muscle tone.   Skin: no rashes, lesions, ulcers. No induration Neurologic: CN 2-12 grossly intact. Sensation intact, DTR normal. Strength 4/5 in left arm and left leg compared to 5/5 on the right Psychiatric: Normal judgment and insight. Alert and oriented x 3. Normal mood.    Labs on Admission: I have personally reviewed following labs and imaging studies  CBC: Recent Labs  Lab 08/05/23 0542  WBC 7.7  NEUTROABS 3.8  HGB 12.3  HCT 37.9  MCV 81.5  PLT 287   Basic Metabolic Panel: Recent Labs  Lab 08/05/23 0542  NA 141  K 3.1*  CL 105  CO2 29  GLUCOSE 101*  BUN 18  CREATININE 0.88  CALCIUM 9.0   GFR: Estimated Creatinine Clearance: 92.5 mL/min (by C-G  formula based on SCr of 0.88 mg/dL). Liver Function Tests: Recent Labs  Lab 08/05/23 0542  AST 14*  ALT 14  ALKPHOS 64  BILITOT 0.3  PROT 6.1*  ALBUMIN 3.5   No results for input(s): "LIPASE", "AMYLASE" in the last 168 hours. No results for input(s): "AMMONIA" in the last 168 hours. Coagulation Profile: Recent Labs  Lab 08/05/23 0542  INR 1.0   Cardiac Enzymes: No results for input(s): "CKTOTAL", "CKMB", "CKMBINDEX", "TROPONINI" in the last 168 hours. BNP (last 3 results) No results for input(s): "PROBNP" in the last 8760 hours. HbA1C: No results for input(s): "HGBA1C" in the last 72 hours. CBG: Recent Labs  Lab 08/05/23 0533  GLUCAP 125*   Lipid Profile: No results for input(s): "CHOL", "HDL", "LDLCALC", "TRIG", "CHOLHDL", "LDLDIRECT" in the last 72 hours. Thyroid Function Tests: No results for input(s): "TSH", "T4TOTAL", "FREET4", "T3FREE", "THYROIDAB" in the last 72 hours. Anemia Panel: No results for input(s): "VITAMINB12", "FOLATE", "FERRITIN", "TIBC", "IRON", "RETICCTPCT" in the last 72 hours. Urine analysis:    Component Value Date/Time   COLORURINE YELLOW (A) 07/24/2023 0050   APPEARANCEUR HAZY (A) 07/24/2023 0050   APPEARANCEUR Hazy 11/13/2013 2015   LABSPEC 1.016 07/24/2023 0050   LABSPEC 1.025  11/13/2013 2015   PHURINE 6.0 07/24/2023 0050   GLUCOSEU NEGATIVE 07/24/2023 0050   GLUCOSEU Negative 11/13/2013 2015   HGBUR NEGATIVE 07/24/2023 0050   BILIRUBINUR NEGATIVE 07/24/2023 0050   BILIRUBINUR Negative 11/13/2013 2015   KETONESUR NEGATIVE 07/24/2023 0050   PROTEINUR NEGATIVE 07/24/2023 0050   NITRITE NEGATIVE 07/24/2023 0050   LEUKOCYTESUR NEGATIVE 07/24/2023 0050   LEUKOCYTESUR Negative 11/13/2013 2015    Radiological Exams on Admission: CT ANGIO HEAD NECK W WO CM W PERF (CODE STROKE) Result Date: 08/05/2023 CLINICAL DATA:  58 year old female code stroke presentation. Left side deficits. EXAM: CT ANGIOGRAPHY HEAD AND NECK CT PERFUSION BRAIN TECHNIQUE: Multidetector CT imaging of the head and neck was performed using the standard protocol during bolus administration of intravenous contrast. Multiplanar CT image reconstructions and MIPs were obtained to evaluate the vascular anatomy. Carotid stenosis measurements (when applicable) are obtained utilizing NASCET criteria, using the distal internal carotid diameter as the denominator. Multiphase CT imaging of the brain was performed following IV bolus contrast injection. Subsequent parametric perfusion maps were calculated using RAPID software. RADIATION DOSE REDUCTION: This exam was performed according to the departmental dose-optimization program which includes automated exposure control, adjustment of the mA and/or kV according to patient size and/or use of iterative reconstruction technique. CONTRAST:  OMNIPAQUE  IOHEXOL  350 MG/ML SOLN COMPARISON:  Plain head CT 0550 hours today. CTA head and neck, brain MRI 07/24/2023. FINDINGS: CT Brain Perfusion Findings: ASPECTS: 10 CBF (<30%) Volume: 0mL Perfusion (Tmax>6.0s) volume: 0mL Mismatch Volume: Not applicablemL Infarction Location:Not applicable CTA NECK Skeleton: Recent maxillary dental extractions, absent dentition. Similar reversal of cervical lordosis. No acute osseous abnormality  identified. Upper chest: Negative. Other neck: Nonvascular neck soft tissue spaces are stable and within normal limits. Aortic arch: Aberrant origin right subclavian artery. Mildly tortuous aortic arch without plaque. Right carotid system: Tortuous proximal right CCA. Normal right carotid bifurcation. Tortuous right ICA below the skull base without plaque or stenosis. Left carotid system: Similar tortuosity. No significant carotid bifurcation plaque or stenosis. Vertebral arteries: Aberrant origin proximal right subclavian artery without stenosis. Normal right vertebral artery origin. Non dominant right vertebral artery is stable with V2 segment tortuosity, patent to the skull base without stenosis. Proximal left subclavian artery and left vertebral artery origin  are normal. Dominant left vertebral artery is patent and normal to the skull base. CTA HEAD Posterior circulation: Patent distal vertebral arteries and vertebrobasilar junction without stenosis. Dominant, mildly tortuous left V4 segment. Patent PICA origins. Patent basilar artery with tortuosity. Normal SCA and PCA origins. Small posterior communicating arteries. Bilateral PCA branches are within normal limits, mildly tortuous. Anterior circulation: Both ICA siphons are patent. No right siphon plaque or stenosis. Minimal left siphon calcified plaque without stenosis. Normal posterior communicating artery origins. Patent carotid termini. Normal MCA and ACA origins. Mildly dominant right A1. Diminutive anterior communicating artery. Bilateral ACA branches are within normal limits. Left MCA M1 segment and bifurcation are patent without stenosis. Right MCA M1 segment and bifurcation are patent without stenosis. Bilateral MCA branches are stable and within normal limits. Venous sinuses: Patent. Anatomic variants: Dominant left and diminutive right vertebral arteries. Aberrant right subclavian artery origin. Mildly dominant right A1. Review of the MIP images  confirms the above findings IMPRESSION: 1. Negative CT Perfusion. 2. Stable CTA Head and Neck with no large vessel occlusion and no significant atherosclerosis. Electronically Signed   By: Marlise Simpers M.D.   On: 08/05/2023 06:31   CT HEAD CODE STROKE WO CONTRAST Addendum Date: 08/05/2023 ADDENDUM REPORT: 08/05/2023 06:08 ADDENDUM: Study discussed by telephone with Dr. Lind Repine on 08/05/2023 at 0559 hours. Electronically Signed   By: Marlise Simpers M.D.   On: 08/05/2023 06:08   Result Date: 08/05/2023 CLINICAL DATA:  Code stroke. 58 year old female who woke with left side symptoms. Weakness, aphasia. EXAM: CT HEAD WITHOUT CONTRAST TECHNIQUE: Contiguous axial images were obtained from the base of the skull through the vertex without intravenous contrast. RADIATION DOSE REDUCTION: This exam was performed according to the departmental dose-optimization program which includes automated exposure control, adjustment of the mA and/or kV according to patient size and/or use of iterative reconstruction technique. COMPARISON:  Brain MRI, head CT 07/24/2023. FINDINGS: Brain: Normal cerebral volume. No midline shift, ventriculomegaly, mass effect, evidence of mass lesion, intracranial hemorrhage or evidence of cortically based acute infarction. Gray-white matter differentiation is within normal limits throughout the brain. Vascular: No suspicious intracranial vascular hyperdensity. Intracranial artery tortuosity better demonstrated on the recent MRI. Skull: Intact.  No acute osseous abnormality identified. Sinuses/Orbits: Visualized paranasal sinuses and mastoids are stable and well aerated. Other: No gaze deviation now. Visualized orbits and scalp soft tissues are within normal limits. ASPECTS [facility] Stroke Program Early CT Score) Total score (0-10 with 10 being normal): 10 IMPRESSION: Stable and Normal noncontrast CT appearance of the brain. ASPECTS 10. Electronically Signed: By: Marlise Simpers M.D. On: 08/05/2023 05:57    EKG:  Independently reviewed.  Sinus rhythm, no acute ST changes.  Assessment/Plan Principal Problem:   TIA (transient ischemic attack)  (please populate well all problems here in Problem List. (For example, if patient is on BP meds at home and you resume or decide to hold them, it is a problem that needs to be her. Same for CAD, COPD, HLD and so on)  Left-sided paresis - Stroke workup, MRI pending. Not a candidate for TNK due to low NIHSS score and improving neurological exam. - Neurology consultation appreciated, start aspirin 81 mg daily - Allow permissive hypertension - Frequent neurochecks - Echocardiogram - PT OT evaluation - Risk factors modification, trend A1c and lipid panel. Check UDS  HTN -Hold off home BP meds including verapamil, triamterene and hydrochlorothiazide - Start as needed hydralazine  IDDM -Hold off metformin  -SSI  Peripheral neuropathy - Continue  Lyrica  and Topamax  Morbid obesity - BMI 40, outpatient GLP-1 evaluation  DVT prophylaxis: Lovenox Code Status: Full code Family Communication: Mother at bedside Disposition Plan: Expect less than 2 midnight hospital stay Consults called: Neurology Admission status: Telemetry observation   [place] MD [facility] Pager [phone]  08/05/2023, 8:08 AM

## 2023-08-05 NOTE — ED Notes (Signed)
 Code Sroke called to [facility] spoke with Kianna

## 2023-08-05 NOTE — ED Notes (Signed)
 Pt is in ct. Stroke cart is at the pt side

## 2023-08-05 NOTE — Evaluation (Signed)
 Speech Language Pathology Evaluation Patient Details Name: Michele [name] MRN: [id number] DOB: 04/17/1965 Today's Date: 08/05/2023 Time: 4098-1191 SLP Time Calculation (min) (ACUTE ONLY): 14 min  Problem List:  Patient Active Problem List   Diagnosis Date Noted   TIA (transient ischemic attack) 08/05/2023   Chronic painful diabetic neuropathy (HCC) 10/26/2021   Chronic radicular lumbar pain 10/26/2021   Lumbar facet joint syndrome 03/21/2018   Chronic right-sided low back pain with right-sided sciatica 03/21/2018   Chronic pain syndrome 03/21/2018   Obesity, morbid, BMI 40.0-49.9 (HCC) 07/12/2017   Bilateral occipital neuralgia 09/14/2016   Neck pain 12/16/2014   Migraines 04/28/2014   Diabetes mellitus type 2, uncomplicated (HCC) 04/28/2014   Past Medical History:  Past Medical History:  Diagnosis Date   Diabetes mellitus without complication (HCC)    High cholesterol    Hypertension    Migraine headache    Migraines    Past Surgical History:  Past Surgical History:  Procedure Laterality Date   ABDOMINAL HYSTERECTOMY     ANKLE SURGERY     CESAREAN SECTION     KNEE SURGERY     WRIST SURGERY     HPI:  Per H&P, "Michele [name] is a 58 y.o. female with medical history significant of IDDM, HTN, HLD, migraines headache, presented with strokelike symptoms.     Patient went to bed as usual around 11 PM, woke up around 4 AM this morning with new onset of left-sided face left arm and left leg weakness associated with neck pain and difficulty finding words.  Came to ED, code stroke was triggered and patient symptoms subsided during ED stay.  CT head without contrast and CTA showed no acute findings and no LVO.  Later during the ED stay however patient again developed left-sided weakness and at the time I saw her she reported improvement of left-sided strength but still weaker than her baseline. " MRI 08/05/23: No acute intracranial abnormality.     Stable from last month, mild for age  nonspecific white matter signal  changes   Assessment / Plan / Recommendation Clinical Impression  Pt seen for cognitive linguistic evaluation in the setting of code stroke. MRI negative for acute infarct. Assessment consisting of pt interview, with formal material held secondary to pt level of fatigue (pt frequently falling asleep mid sentence, requiring verbal cues to awaken). Pt demonstrating fluent verbal expression both on the phone and with therapist during interview. Intermittent anomic hesitations noted, with pt self monitoring and correcting t/o. Pt reporting increased challenge with word finding in the setting of heightened emotion (frustration/anger), further suspect that fatigue could also impact verbal expression. Auditory comprehension intact for semi complex WH questions. Attention impacted by fatigue, with pt demonstrating grossly intact orientation, functional recall, and awareness for perceived deficits. Recommend follow up SLP services at next level of care if anomic hesitations persist/pt desires- no further acute SLP services.      SLP Assessment  SLP Recommendation/Assessment: All further Speech Lanaguage Pathology  needs can be addressed in the next venue of care SLP Visit Diagnosis: Aphasia (R47.01)    Recommendations for follow up therapy are one component of a multi-disciplinary discharge planning process, led by the attending physician.  Recommendations may be updated based on patient status, additional functional criteria and insurance authorization.    Follow Up Recommendations  Follow physician's recommendations for discharge plan and follow up therapies    Assistance Recommended at Discharge  Set up Supervision/Assistance  Functional Status Assessment Patient has  had a recent decline in their functional status and demonstrates the ability to make significant improvements in function in a reasonable and predictable amount of time. (all needs to be met at next level of  care)  Frequency and Duration           SLP Evaluation Cognition  Arousal/Alertness: Lethargic (falling asleep mid sentence) Orientation Level: Oriented to person;Oriented to place;Oriented to time Attention: Sustained Sustained Attention: Impaired Sustained Attention Impairment: Verbal basic (impacted by fatigue) Memory: Appears intact (for recall of events leading to hoslital stay) Awareness: Appears intact Problem Solving:  (not directly assessed) Executive Function:  (not directly assessed) Behaviors:  (none) Safety/Judgment: Appears intact       Comprehension  Auditory Comprehension Overall Auditory Comprehension: Appears within functional limits for tasks assessed Yes/No Questions: Within Functional Limits Commands: Not tested Conversation: Simple Interfering Components:  (fatigue) Visual Recognition/Discrimination Discrimination: Not tested Reading Comprehension Reading Status: Not tested    Expression Expression Primary Mode of Expression: Verbal Verbal Expression Overall Verbal Expression: Impaired Initiation: No impairment Automatic Speech: Social Response Level of Generative/Spontaneous Verbalization: Conversation Repetition: No impairment Naming: Not tested Pragmatics: No impairment Interfering Components:  (fatigue) Effective Techniques: Open ended questions (additional time)   Oral / Motor  Oral Motor/Sensory Function Overall Oral Motor/Sensory Function: Within functional limits Motor Speech Overall Motor Speech: Appears within functional limits for tasks assessed           Michele Lagena Strand Clapp, MS, CCC-SLP Speech Language Pathologist Rehab Services; [facility] [phone] (ascom)   Michele J [name] 08/05/2023, 9:46 AM

## 2023-08-05 NOTE — Progress Notes (Signed)
 Hypoglycemic Event  CBG: 63  Treatment: 4 oz juice/soda  Symptoms: Hungry  Follow-up CBG: Time:1022 CBG Result:130  Possible Reasons for Event: Inadequate meal intake  Comments/MD notified: Pt ate breakfast, ordered an additional pancake and drank some orange juice. Pt stated she did not have enough to eat and felt that was the reason for her blood sugar drop.     Keirstin Musil

## 2023-08-05 NOTE — Progress Notes (Signed)
 NEUROLOGY CONSULT FOLLOW UP NOTE   Date of service: August 05, 2023 Patient Name: Michele [name] MRN:  [id number] DOB:  11/04/1965  Interval Hx/subjective   This afternoon reports she thinks her symptoms are secondary to migraines   Key details from last outpatient neurology (Dr. Mason Sole) note 11/2022 Michele [name] is a African-American 58 y.o. female here for evaluation No chief complaint on file.  Headache with migrainous component: who has headache since age of 68. She has headache 3-4 times a year but when her headache comes they are very severe. Her headaches are mostly right temporal but can happen left temporal. She has severe photophobia, phonophobia, nausea, vomiting associated with it. Activity makes it worse. She becomes irritable. Last time she had to go to the ER was in November of 2011.  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 Patient occasionally gets dizziness for which she has seen the Emergency Room in the past. She has severe room spinning sensation with her dizziness. She was given meclizine  for this which she still takes occasionally for episodes. This occurs now about 3-4 times per year. Patient has history of very frequent ear infections in the past. Medication: meclizine     Body mass index is 35.74 kg/m. Significant cervical Paraspinal and trapezius tenderness Bilateral rhomboid tenderness  Mallampati score: class IV (only hard palate visible).    Vitals   Vitals:   08/05/23 0637 08/05/23 0730 08/05/23 0800 08/05/23 0906  BP: 124/73 (!) 141/74 (!) 131/92 (!) 130/91  Pulse: 73 76 71 65  Resp: 20 16 17 18   Temp:    98.3 F (36.8 C)   TempSrc:      SpO2: 100% 100% 100% 100%  Weight:         Body mass index is 40.53 kg/m.  Physical Exam   Constitutional: Appears fatigued but no acute distress Psych: Affect flat, cooperative  Eyes: No scleral injection. HENT: No OP obstrucion.  She has point tenderness in the upper trapezius muscles particularly on the left Head: Normocephalic.  Cardiovascular: Normal rate and regular rhythm.  Respiratory: Effort normal, non-labored breathing.  GI: Soft.  No distension. There is no tenderness.    Neurologic Examination   Mental Status: Patient is awake, alert, oriented to person, place, month, year, and situation.  Patient is able to give a clear and coherent history. No signs of aphasia or neglect, at times she will inconsistently stutter or inconsistently slur her speech which appears nonorganic Cranial Nerves: II: Visual Fields are full. Pupils are equal, round, and reactive to light.   III,IV, VI: EOMI without ptosis or diploplia.  V: Facial sensation is symmetric to temperature VII: Facial movement is symmetric.  VIII: hearing is intact to voice X: Uvula difficult to visualize XI: Head turn limited by pain XII: tongue is midline without atrophy or fasciculations.  Motor: Tone is normal. Bulk is normal. 5/5 strength was present throughout the right side except for 4/5 hip flexion secondary to pain.  4/5 left deltoid secondary to shoulder pain, 4-/5 left hip flexion again secondary to pain Sensory: Reports mildly reduced sensation throughout the left arm and leg in no particular distribution Deep Tendon Reflexes: I am unable to elicit her patellar reflexes even  with distraction but her adductors are present.  Brachioradialis is 2+ and symmetric.  No Hoffman's.   Cerebellar: Finger to nose intact bilaterally Gait:  Slow and cautious but steady without any hemiparesis.    Medications  Current Facility-Administered Medications:    [START ON 08/06/2023]  stroke:  early stages of recovery book, , Does not apply, Once, Antoniette Batty T, MD   acetaminophen  (TYLENOL ) tablet 650 mg, 650 mg, Oral, Q4H PRN **OR** acetaminophen  (TYLENOL ) 160 MG/5ML solution 650 mg, 650 mg, Per Tube, Q4H PRN **OR** acetaminophen  (TYLENOL ) suppository 650 mg, 650 mg, Rectal, Q4H PRN, Antoniette Batty T, MD   amitriptyline  (ELAVIL ) tablet 50 mg, 50 mg, Oral, QHS, Zhang, Herbie Loll T, MD   aspirin EC tablet 81 mg, 81 mg, Oral, Daily, Zhang, Ping T, MD, 81 mg at 08/05/23 1010   atorvastatin (LIPITOR) tablet 20 mg, 20 mg, Oral, q1800, Zhang, Ping T, MD   enoxaparin (LOVENOX) injection 40 mg, 40 mg, Subcutaneous, Q24H, Zhang, Ping T, MD, 40 mg at 08/05/23 1010   hydrALAZINE (APRESOLINE) injection 5 mg, 5 mg, Intravenous, Q6H PRN, Antoniette Batty T, MD   insulin  aspart (novoLOG ) injection 0-15 Units, 0-15 Units, Subcutaneous, TID WC, Zhang, Brooke Canton, MD   insulin  aspart (novoLOG ) injection 0-5 Units, 0-5 Units, Subcutaneous, QHS, Zhang, Ping T, MD   LORazepam (ATIVAN) injection 0.5 mg, 0.5 mg, Intravenous, Q4H PRN, Jeane Miguel, Ping T, MD, 0.5 mg at 08/05/23 0757   meclizine  (ANTIVERT ) tablet 25 mg, 25 mg, Oral, TID PRN, Antoniette Batty T, MD   pantoprazole  (PROTONIX ) EC tablet 20 mg, 20 mg, Oral, Daily, Zhang, Herbie Loll T, MD   senna-docusate (Senokot-S) tablet 1 tablet, 1 tablet, Oral, QHS PRN, Antoniette Batty T, MD   sodium chloride  flush (NS) 0.9 % injection 3-10 mL, 3-10 mL, Intravenous, Q12H, Zhang, Ping T, MD, 10 mL at 08/05/23 1014   sodium chloride  flush (NS) 0.9 % injection 3-10 mL, 3-10 mL, Intravenous, PRN, Antoniette Batty T, MD   topiramate (TOPAMAX) tablet 100 mg, 100 mg, Oral, BID, Antoniette Batty T, MD  Labs and Diagnostic Imaging   CBC:  Recent Labs  Lab 08/05/23 0542  WBC 7.7  NEUTROABS 3.8  HGB 12.3  HCT 37.9  MCV 81.5  PLT 287    Basic Metabolic Panel:  Lab Results  Component Value Date   NA 141 08/05/2023   K 3.1 (L) 08/05/2023   CO2 29 08/05/2023   GLUCOSE 101 (H) 08/05/2023   BUN 18 08/05/2023    CREATININE 0.88 08/05/2023   CALCIUM 9.0 08/05/2023   GFRNONAA >60 08/05/2023   GFRAA >60 05/13/2019   Lipid Panel: No results found for: "LDLCALC" HgbA1c: No results found for: "HGBA1C" Urine Drug Screen: No results found for: "LABOPIA", "COCAINSCRNUR", "LABBENZ", "AMPHETMU", "THCU", "LABBARB"   Alcohol Level     Component Value Date/Time   ETH <15 08/05/2023 0542   INR  Lab Results  Component Value Date   INR 1.0 08/05/2023   APTT  Lab Results  Component Value Date   APTT 28 08/05/2023   AED levels: No results found for: "PHENYTOIN", "ZONISAMIDE", "LAMOTRIGINE", "LEVETIRACETA"  CT Head without contrast(Personally reviewed): no acute intracranial process   CT angio Head and Neck with contrast(Personally reviewed): 1. Negative CT Perfusion. 2. Stable CTA Head and Neck with no large vessel occlusion and no significant atherosclerosis.  MRI Brain(Personally reviewed): No acute intracranial abnormality.   Stable from last month [MRI obtained for dizziness, not improving with meclizine  ], mild for age nonspecific white  matter signal changes.   Assessment   Michele [name] is a 58 y.o. female presenting with her typical headache which she reports is typically responsive to trigger point injections more than anything else she has tried.  Given negative MRI brain with ongoing symptoms, she has not had a stroke or TIA.  Low concern for any spinal cord process given bowel and bladder function and sensation in the GU area remains intact per her report, and she is also ambulating steadily (with more strength than she is able to produce on confrontational testing).  She does not give a history consistent with medication overuse headache as she reports she does not really need medications for her headache as she has not had any since her last trigger point injection  Recommendations  - Outpatient repeat sleep study and use CPAP  - Trial of lidocaine  gel to the neck for pain  relief - Outpatient follow-up with Dr. Mason Sole for trigger point injections - Inpatient neurology will sign off at this time, please reach out if any additional questions or concerns arise ______________________________________________________________________  Baldwin Levee MD-PhD [facility] [phone]  No charge same day note

## 2023-08-05 NOTE — ED Provider Notes (Signed)
 [facility] Provider Note    Event Date/Time   First MD Initiated Contact with Patient 08/05/23 [phone]     (approximate)   History   Weakness   HPI  Michele [name] is a 58 y.o. female with a history of diabetes, hypertension, hyperlipidemia, migraines, vertigo, chronic pain who presents with weakness and aphasia.  The patient's last known well was around 11 PM last night when she fell asleep.  She woke up at 445 this morning and noted diffuse pain throughout her body which then became more on the left side involving her left arm and left leg, along with some weakness.  At the same time she thought that she was yelling for someone to come help her, but she found that she actually was not able to speak normally.  She states that the symptoms are subsiding although she is still having some difficulty speaking.  The pain is improved.    I reviewed the past medical records.  The patient was most recently seen in the ED on 5/21 with dizziness consistent with prior vertigo symptoms.  Her most recent outpatient counter was with internal medicine on 5/23 for an annual physical exam.   Physical Exam   Triage Vital Signs: ED Triage Vitals  Encounter Vitals Group     BP 08/05/23 0539 (!) 153/89     Systolic BP Percentile --      Diastolic BP Percentile --      Pulse Rate 08/05/23 0539 70     Resp 08/05/23 0539 20     Temp --      Temp src --      SpO2 08/05/23 0539 100 %     Weight 08/05/23 0539 251 lb 1.7 oz (113.9 kg)     Height --      Head Circumference --      Peak Flow --      Pain Score --      Pain Loc --      Pain Education --      Exclude from Growth Chart --     Most recent vital signs: Vitals:   08/05/23 0634 08/05/23 0637  BP:  124/73  Pulse:  73  Resp:  20  Temp: 97.9 F (36.6 C)   SpO2:  100%    General: Awake, no distress.  CV:  Good peripheral perfusion.  Resp:  Normal effort.  Abd:  No distention.  Other:  EOMI.  PERRLA.  No  photophobia.  No facial droop.  Facial muscle spasms when the patient smiles.  Intermittent expressive aphasia.  Slight decreased grip strength left upper extremity.  Motor otherwise intact in all extremities.   ED Results / Procedures / Treatments   Labs (all labs ordered are listed, but only abnormal results are displayed) Labs Reviewed  COMPREHENSIVE METABOLIC PANEL WITH GFR - Abnormal; Notable for the following components:      Result Value   Potassium 3.1 (*)    Glucose, Bld 101 (*)    Total Protein 6.1 (*)    AST 14 (*)    All other components within normal limits  CBG MONITORING, ED - Abnormal; Notable for the following components:   Glucose-Capillary 125 (*)    All other components within normal limits  ETHANOL  PROTIME-INR  APTT  CBC  DIFFERENTIAL  URINE DRUG SCREEN, QUALITATIVE ([facility] ONLY)  TROPONIN I (HIGH SENSITIVITY)  TROPONIN I (HIGH SENSITIVITY)     EKG  ED ECG  REPORT I, Lind Repine, the attending physician, personally viewed and interpreted this ECG.  Date: 08/05/2023 EKG Time: 0630 Rate: 70 Rhythm: normal sinus rhythm QRS Axis: normal Intervals: normal ST/T Wave abnormalities: normal Narrative Interpretation: no evidence of acute ischemia    RADIOLOGY  CT head: I independently viewed and interpreted the images; there is no ICH.  Radiology report indicates no acute abnormality  CTA head/neck:  IMPRESSION:  1. Negative CT Perfusion.  2. Stable CTA Head and Neck with no large vessel occlusion and no  significant atherosclerosis.    PROCEDURES:  Critical Care performed: Yes, see critical care procedure note(s)  .Critical Care  Performed by: Lind Repine, MD Authorized by: Lind Repine, MD   Critical care provider statement:    Critical care time (minutes):  30   Critical care time was exclusive of:  Separately billable procedures and treating other patients   Critical care was necessary to treat or prevent imminent  or life-threatening deterioration of the following conditions:  CNS failure or compromise   Critical care was time spent personally by me on the following activities:  Development of treatment plan with patient or surrogate, discussions with consultants, evaluation of patient's response to treatment, examination of patient, ordering and review of laboratory studies, ordering and review of radiographic studies, ordering and performing treatments and interventions, pulse oximetry, re-evaluation of patient's condition, review of old charts and obtaining history from patient or surrogate   Care discussed with: admitting provider      MEDICATIONS ORDERED IN ED: Medications  iohexol  (OMNIPAQUE ) 350 MG/ML injection 100 mL (100 mLs Intravenous Contrast Given 08/05/23 0608)  metoCLOPramide  (REGLAN ) injection 10 mg (10 mg Intravenous Given 08/05/23 0649)  diphenhydrAMINE  (BENADRYL ) injection 25 mg (25 mg Intravenous Given 08/05/23 0650)     IMPRESSION / MDM / ASSESSMENT AND PLAN / ED COURSE  I reviewed the triage vital signs and the nursing notes.  58 year old female with PMH as noted above presents with acute onset of expressive aphasia this morning associated initially with generalized body pain but now more with pain on the left side along with some weakness noted by EMS and in the ED.  On exam currently, the vital signs are normal except for hypertension.  The patient has no facial droop.  There is mild weakness to the left upper extremity.  EMS had noted some weakness to the left lower extremity which has resolved.  She continues to have intermittent expressive aphasia.  Differential diagnosis includes, but is not limited to, CVA, TIA, complex migraine, cervical radiculopathy (although this would not explain the speech difficulty), electrolyte abnormality, other metabolic cause.  Although her last known well was around 11 PM, since the patient has LVO symptoms (aphasia) plus mild weakness, I have activated a  code stroke.  We will obtain CT head, lab workup, neurology consult, and reassess.  Patient's presentation is most consistent with acute presentation with potential threat to life or bodily function.  The patient is on the cardiac monitor to evaluate for evidence of arrhythmia and/or significant heart rate changes.   ----------------------------------------- 7:11 AM on 08/05/2023 -----------------------------------------  CT and CT head are negative for acute findings.  Lab workup is unremarkable.  CMP shows no acute abnormality.  Troponin is negative.  CBC is normal.  I consulted and discussed the case with Dr. Assunta Lax from teleneurology who recommends treatment for migraine and inpatient admission.   FINAL CLINICAL IMPRESSION(S) / ED DIAGNOSES   Final diagnoses:  Expressive aphasia  Rx / DC Orders   ED Discharge Orders     None        Note:  This document was prepared using Dragon voice recognition software and may include unintentional dictation errors.    Lind Repine, MD 08/05/23 [phone]

## 2023-08-05 NOTE — Progress Notes (Signed)
   08/05/23 0920  Spiritual Encounters  Type of Visit Initial  Care provided to: Pt not available   Chaplain attempted visit but patient was unavailable.

## 2023-08-05 NOTE — ED Notes (Signed)
 Patient transported to MRI

## 2023-08-05 NOTE — Evaluation (Signed)
 Physical Therapy Evaluation Patient Details Name: Talya Quain MRN: [id number] DOB: 1965/07/29 Today's Date: 08/05/2023  History of Present Illness  58 y/o female presented to ED on 08/04/23 for aphasia and L sided weakness. Imaging negative. PMH: IDDM, HTN, migraines, headache, chronic pain  Clinical Impression  Patient admitted with the above. PTA, patient lives with husband, daughter and grandchild and was independent, driving, and working. Patient presents with weakness, impaired balance, and decreased activity tolerance. Required CGA-supervision for sit to stand and ambulation to/from bathroom with no AD. Patient endorses pins and needles sensation in LLE throughout session. Patient is close to baseline despite LLE symptoms. Patient will benefit from skilled PT services during acute stay to address listed deficits. Patient will benefit from ongoing therapy at discharge to maximize functional independence and safety.         If plan is discharge home, recommend the following:     Can travel by private vehicle        Equipment Recommendations None recommended by PT  Recommendations for Other Services       Functional Status Assessment Patient has had a recent decline in their functional status and demonstrates the ability to make significant improvements in function in a reasonable and predictable amount of time.     Precautions / Restrictions Precautions Precautions: Fall Recall of Precautions/Restrictions: Intact Restrictions Weight Bearing Restrictions Per Provider Order: No      Mobility  Bed Mobility Overal bed mobility: Modified Independent                  Transfers Overall transfer level: Needs assistance Equipment used: None Transfers: Sit to/from Stand Sit to Stand: Supervision                Ambulation/Gait Ambulation/Gait assistance: Supervision Gait Distance (Feet): 20 Feet (+20') Assistive device: None Gait Pattern/deviations:  Step-through pattern, Decreased stride length Gait velocity: decreased     General Gait Details: supervision for safety. Ambulated to/from bathroom. Initially hesitant on walk to bathroom, improved on ambulation back to bed  Stairs            Wheelchair Mobility     Tilt Bed    Modified Rankin (Stroke Patients Only)       Balance Overall balance assessment: Mild deficits observed, not formally tested                                           Pertinent Vitals/Pain Pain Assessment Pain Assessment: Faces Faces Pain Scale: Hurts little more Pain Location: left neck Pain Descriptors / Indicators: Radiating Pain Intervention(s): Limited activity within patient's tolerance, Monitored during session    Home Living Family/patient expects to be discharged to:: Private residence Living Arrangements: Children;Spouse/significant other Available Help at Discharge: Family Type of Home: House Home Access: Stairs to enter Entrance Stairs-Rails: Left;Right;Can reach both Secretary/administrator of Steps: 3   Home Layout: One level Home Equipment: None      Prior Function Prior Level of Function : Independent/Modified Independent;Working/employed;Driving                     Extremity/Trunk Assessment   Upper Extremity Assessment Upper Extremity Assessment: Defer to OT evaluation    Lower Extremity Assessment Lower Extremity Assessment: Generalized weakness (reports L LE pins and needles throughout LLE)       Communication   Communication Communication: No apparent  difficulties    Cognition Arousal: Alert Behavior During Therapy: WFL for tasks assessed/performed   PT - Cognitive impairments: No apparent impairments                         Following commands: Intact       Cueing       General Comments      Exercises     Assessment/Plan    PT Assessment Patient needs continued PT services  PT Problem List Decreased  strength;Decreased activity tolerance;Decreased balance;Decreased mobility;Decreased safety awareness;Decreased coordination;Impaired sensation       PT Treatment Interventions DME instruction;Gait training;Stair training;Therapeutic activities;Functional mobility training;Therapeutic exercise;Balance training    PT Goals (Current goals can be found in the Care Plan section)  Acute Rehab PT Goals Patient Stated Goal: to go home PT Goal Formulation: With patient Time For Goal Achievement: 08/19/23 Potential to Achieve Goals: Good    Frequency Min 1X/week     Co-evaluation               AM-PAC PT "6 Clicks" Mobility  Outcome Measure Help needed turning from your back to your side while in a flat bed without using bedrails?: None Help needed moving from lying on your back to sitting on the side of a flat bed without using bedrails?: None Help needed moving to and from a bed to a chair (including a wheelchair)?: A Little Help needed standing up from a chair using your arms (e.g., wheelchair or bedside chair)?: A Little Help needed to walk in hospital room?: A Little Help needed climbing 3-5 steps with a railing? : A Little 6 Click Score: 20    End of Session   Activity Tolerance: Patient tolerated treatment well Patient left: in bed;with call bell/phone within reach;with nursing/sitter in room;with family/visitor present Nurse Communication: Mobility status PT Visit Diagnosis: Unsteadiness on feet (R26.81);Muscle weakness (generalized) (M62.81)    Time: 0865-7846 PT Time Calculation (min) (ACUTE ONLY): 18 min   Charges:   PT Evaluation $PT Eval Low Complexity: 1 Low   PT General Charges $$ ACUTE PT VISIT: 1 Visit         Janine Melbourne, PT, DPT Physical Therapist - [facility]  [facility]  Aryan Bello A Deane Wattenbarger 08/05/2023, 10:06 AM

## 2023-08-05 NOTE — Evaluation (Signed)
 Occupational Therapy Evaluation Patient Details Name: Michele [name] MRN: [id number] DOB: 12-01-1965 Today's Date: 08/05/2023   History of Present Illness   58 y/o female presented to ED on 08/04/23 for aphasia and L sided weakness. Imaging negative. PMH: IDDM, HTN, migraines, headache, chronic pain     Clinical Impressions Pt was seen for OT evaluation this date. Prior to hospital admission, pt was indep in all aspects, living with her spouse, daughter, and granddaughter. Pt works with machinery involved with spinning yarn. Pt presents to acute OT demonstrating impaired ADL performance and functional mobility 2/2 hx L shoulder impairments, headache, and pt endorsing L side weakness and impaired sensation (See OT problem list for additional functional deficits). Mild tremor noted with MMT, improved with functional use and distraction. Initially slow/cautious to stand and ambulate in the room with initial handheld assist improving to supv and no AD with time. No assist required for all aspects of toileting and LB dressing.  Pt would benefit from skilled OT services to address noted impairments and functional limitations (see below for any additional details) in order to maximize safety and independence while minimizing falls risk and caregiver burden. Do not anticipate the need for follow up OT services upon acute hospital DC.    If plan is discharge home, recommend the following:   A little help with walking and/or transfers;Assistance with cooking/housework;Assist for transportation;Help with stairs or ramp for entrance     Functional Status Assessment   Patient has had a recent decline in their functional status and demonstrates the ability to make significant improvements in function in a reasonable and predictable amount of time.     Equipment Recommendations   None recommended by OT      Precautions/Restrictions   Precautions Precautions: Fall Recall of  Precautions/Restrictions: Intact Restrictions Weight Bearing Restrictions Per Provider Order: No     Mobility Bed Mobility Overal bed mobility: Modified Independent     Transfers Overall transfer level: Needs assistance Equipment used: None Transfers: Sit to/from Stand Sit to Stand: Supervision           General transfer comment: slow, cautious      Balance Overall balance assessment: Mild deficits observed, not formally tested       ADL either performed or assessed with clinical judgement   ADL Overall ADL's : Needs assistance/impaired      Lower Body Dressing: Sitting/lateral leans;Modified independent Lower Body Dressing Details (indicate cue type and reason): dons slip on shoes Toilet Transfer: Supervision/safety;Regular Toilet;Ambulation   Toileting- Clothing Manipulation and Hygiene: Sitting/lateral lean;Sit to/from stand;Modified independent Toileting - Clothing Manipulation Details (indicate cue type and reason): indep seated pericare using R hand, no noted difficulty using bilat to retrieve toilet paper, slow to stand but steady to complete clothing mgt     Functional mobility during ADLs: Supervision/safety        Pertinent Vitals/Pain Pain Assessment Pain Assessment: 0-10 Pain Score: 9  Pain Descriptors / Indicators: Headache Pain Intervention(s): Limited activity within patient's tolerance, Monitored during session, Premedicated before session, Repositioned, Patient requesting pain meds-RN notified     Extremity/Trunk Assessment Upper Extremity Assessment Upper Extremity Assessment: Generalized weakness;Overall WFL for tasks assessed;LUE deficits/detail LUE Deficits / Details: questionable give way strength on assessment and mild L hand tremor improved with functional use and distraction; hx of L shoulder pain and limited shoulder ROM; grossly 4/5 bilat; pt states her LUE feels "pins and needles" LUE Sensation: decreased light touch LUE  Coordination: decreased gross motor   Lower  Extremity Assessment Lower Extremity Assessment: Generalized weakness;Defer to PT evaluation       Communication Communication Communication: No apparent difficulties   Cognition Arousal: Alert Behavior During Therapy: WFL for tasks assessed/performed Cognition: No apparent impairments      Following commands: Intact              Home Living Family/patient expects to be discharged to:: Private residence Living Arrangements: Children;Spouse/significant other Available Help at Discharge: Family Type of Home: House Home Access: Stairs to enter Secretary/administrator of Steps: 3 Entrance Stairs-Rails: Left;Right;Can reach both Home Layout: One level     Bathroom Shower/Tub: Tub/shower unit         Home Equipment: None          Prior Functioning/Environment Prior Level of Function : Independent/Modified Independent;Working/employed;Driving       OT Problem List: Decreased strength;Impaired sensation;Pain;Decreased knowledge of use of DME or AE   OT Treatment/Interventions: Self-care/ADL training;Therapeutic exercise;Therapeutic activities;DME and/or AE instruction;Patient/family education      OT Goals(Current goals can be found in the care plan section)   Acute Rehab OT Goals Patient Stated Goal: feel better OT Goal Formulation: With patient Time For Goal Achievement: 08/19/23 Potential to Achieve Goals: Good ADL Goals Additional ADL Goal #1: Pt will verbalize plan to implement at least 1 learned home/routine modification to minimize L shoulder pain. Additional ADL Goal #2: Pt will be indep with home ex program with theraband and written instructions.   OT Frequency:  Min 1X/week       AM-PAC OT "6 Clicks" Daily Activity     Outcome Measure Help from another person eating meals?: None Help from another person taking care of personal grooming?: None Help from another person toileting, which includes using  toliet, bedpan, or urinal?: None Help from another person bathing (including washing, rinsing, drying)?: A Little Help from another person to put on and taking off regular upper body clothing?: None Help from another person to put on and taking off regular lower body clothing?: None 6 Click Score: 23   End of Session Nurse Communication: Patient requests pain meds  Activity Tolerance: Patient tolerated treatment well Patient left: in bed;with call bell/phone within reach;with family/visitor present  OT Visit Diagnosis: Other abnormalities of gait and mobility (R26.89);Muscle weakness (generalized) (M62.81)                Time: 1027-2536 OT Time Calculation (min): 20 min Charges:  OT General Charges $OT Visit: 1 Visit OT Evaluation $OT Eval Low Complexity: 1 Low  Berenda Breaker., MPH, MS, OTR/L ascom [phone] 08/05/23, 2:59 PM

## 2023-08-05 NOTE — ED Triage Notes (Signed)
 Last night pt went to sleep around 11am in the bed normal. Woke up around 4am with pain shooting from the left side of her neck, to her left arm. Per EMS pt had some weakness to left arm and left leg with some periods of asphasia. CBG 125. Pt alert and oreinted x4. No anticoagulants or HX of CVA. EMS gave CVA score of 1 .

## 2023-08-06 ENCOUNTER — Observation Stay
Admit: 2023-08-06 | Discharge: 2023-08-06 | Disposition: A | Discharge status: Home / Self Care | Attending: Internal Medicine | Admitting: Internal Medicine

## 2023-08-06 DIAGNOSIS — G459 Transient cerebral ischemic attack, unspecified: Secondary | ICD-10-CM | POA: Diagnosis not present

## 2023-08-06 LAB — ECHOCARDIOGRAM COMPLETE
AR max vel: 2.49 cm2
AV Area VTI: 2.61 cm2
AV Area mean vel: 2.5 cm2
AV Mean grad: 3 mmHg
AV Peak grad: 5.8 mmHg
Ao pk vel: 1.2 m/s
Area-P 1/2: 3.33 cm2
MV VTI: 2.61 cm2
S' Lateral: 2.7 cm
Weight: 4017.66 [oz_av]

## 2023-08-06 LAB — LIPID PANEL
Cholesterol: 105 mg/dL (ref 0–200)
HDL: 47 mg/dL (ref >40)
LDL Cholesterol: 35 mg/dL (ref 0–99)
Total CHOL/HDL Ratio: 2.2 ratio
Triglycerides: 116 mg/dL (ref <150)
VLDL: 23 mg/dL (ref 0–40)

## 2023-08-06 LAB — GLUCOSE, CAPILLARY
Glucose-Capillary: 113 mg/dL — ABNORMAL HIGH (ref 70–99)
Glucose-Capillary: 128 mg/dL — ABNORMAL HIGH (ref 70–99)
Glucose-Capillary: 156 mg/dL — ABNORMAL HIGH (ref 70–99)

## 2023-08-06 MED ORDER — FLUTICASONE PROPIONATE 50 MCG/ACT NA SUSP
1.0000 | Freq: Every day | NASAL | Status: DC | PRN
Start: 2023-08-06 — End: 2023-08-06

## 2023-08-06 MED ORDER — BUTALBITAL-APAP-CAFFEINE 50-325-40 MG PO TABS: 1.0000 | ORAL_TABLET | Freq: Four times a day (QID) | ORAL | 0 refills | Status: AC | PRN

## 2023-08-06 MED ORDER — GABAPENTIN 300 MG PO CAPS
300.0000 mg | ORAL_CAPSULE | Freq: Two times a day (BID) | ORAL | Status: DC
  Administered 2023-08-06: 300 mg via ORAL
  Filled 2023-08-06: qty 1

## 2023-08-06 MED ORDER — ASPIRIN 81 MG PO TBEC: 81.0000 mg | DELAYED_RELEASE_TABLET | Freq: Every day | ORAL | 0 refills | Status: AC

## 2023-08-06 MED ORDER — BACLOFEN 10 MG PO TABS
20.0000 mg | ORAL_TABLET | Freq: Three times a day (TID) | ORAL | Status: DC | PRN
  Administered 2023-08-06: 20 mg via ORAL
  Filled 2023-08-06: qty 2

## 2023-08-06 MED ORDER — LIDOCAINE 4 % EX CREA: TOPICAL_CREAM | Freq: Three times a day (TID) | CUTANEOUS | Status: DC | PRN

## 2023-08-06 MED ORDER — VERAPAMIL HCL ER 180 MG PO TBCR
180.0000 mg | EXTENDED_RELEASE_TABLET | Freq: Every day | ORAL | Status: DC
  Administered 2023-08-06: 180 mg via ORAL
  Filled 2023-08-06: qty 1

## 2023-08-06 MED ORDER — DICLOFENAC SODIUM 1 % EX GEL: 2.0000 g | Freq: Four times a day (QID) | CUTANEOUS | Status: DC | PRN

## 2023-08-06 NOTE — Progress Notes (Signed)
*  PRELIMINARY RESULTS* Echocardiogram 2D Echocardiogram has been performed.  Michele [name] 08/06/2023, 2:36 PM

## 2023-08-06 NOTE — Progress Notes (Signed)
..      [facility] REFERRAL        Occupational Therapy * Physical Therapy * Speech Therapy                           DATE 12:44 PM  PATIENT NAME Michele [name]  PATIENT MRN [id number]       DIAGNOSIS/DIAGNOSIS CODE Transisent Ischemic Attack G45.9  DATE OF DISCHARGE:        PRIMARY CARE PHYSICIAN      PCP PHONE/FAX      Dear Provider (Name: [facility] outpatient __  Fax: [phone]   I certify that I have examined this patient and that occupational/physical/speech therapy is necessary on an outpatient basis.    The patient has expressed interest in completing their recommended course of therapy at your  location.  Once a formal order from the patient's primary care physician has been obtained, please  contact him/her to schedule an appointment for evaluation at your earliest convenience.   [ X]  Physical Therapy Evaluate and Treat  [  ]  Occupational Therapy Evaluate and Treat  [  ]  Speech Therapy Evaluate and Treat         The patient's primary care physician (listed above) must furnish and be responsible for a formal order such that the recommended services may be furnished while under the primary physician's care, and that the plan of care will be established and reviewed every 30 days (or more often if condition necessitates).

## 2023-08-06 NOTE — TOC Initial Note (Signed)
 Transition of Care [facility]) - Initial/Assessment Note    Patient Details  Name: Michele [name] MRN: [id number] Date of Birth: 08-22-1965  Transition of Care [facility]) CM/SW Contact:    Elsie Halo, RN Phone Number: 08/06/2023, 12:38 PM  Clinical Narrative:                 [facility] visited the patient in her room. The patient's aunt was at the bedside. The patient lives at home with her significant other, daughter, and grandkids. She drives and doesn't report any difficulty getting to appts or obtaining medications. Her PCP is Dr. Lincoln Renshaw and [facility] [address]. She doesn't have any DME. Her significant other will transport her at discharge.  Therapy recommends outpatient PT. Patient offered choice and will uses [facility].  TOC will continue to follow.  Expected Discharge Plan: OP Rehab Barriers to Discharge: Continued Medical Work up   Patient Goals and CMS Choice            Expected Discharge Plan and Services   Discharge Planning Services: CM Consult   Living arrangements for the past 2 months: Single Family Home                                      Prior Living Arrangements/Services Living arrangements for the past 2 months: Single Family Home Lives with:: Significant Other, Adult Children, Other (Comment) (grandkids)                   Activities of Daily Living   ADL Screening (condition at time of admission) Independently performs ADLs?: Yes (appropriate for developmental age) Is the patient deaf or have difficulty hearing?: No Does the patient have difficulty seeing, even when wearing glasses/contacts?: No Does the patient have difficulty concentrating, remembering, or making decisions?: No  Permission Sought/Granted                  Emotional Assessment       Orientation: : Oriented to Self, Oriented to Place, Oriented to  Time, Oriented to Situation   Psych Involvement: No (comment)  Admission diagnosis:  TIA  (transient ischemic attack) [G45.9] Expressive aphasia [R47.01] Patient Active Problem List   Diagnosis Date Noted   TIA (transient ischemic attack) 08/05/2023   Paresthesia of left arm and leg 08/05/2023   Chronic painful diabetic neuropathy (HCC) 10/26/2021   Chronic radicular lumbar pain 10/26/2021   Lumbar facet joint syndrome 03/21/2018   Chronic right-sided low back pain with right-sided sciatica 03/21/2018   Chronic pain syndrome 03/21/2018   Obesity, morbid, BMI 40.0-49.9 (HCC) 07/12/2017   Bilateral occipital neuralgia 09/14/2016   Neck pain 12/16/2014   Migraines 04/28/2014   Diabetes mellitus type 2, uncomplicated (HCC) 04/28/2014   PCP:  Little Riff, MD Pharmacy:   [facility] [address] ([address]), [place] - [address] [place] ([place]) [place] [address] Phone: [phone] Fax: [phone]  [facility] [address] [place] - [address] [place] [address] Phone: [phone] Fax: [phone]  [id number] [place] [place] - [address] [place] [address] Phone: [phone] Fax: [phone]  [facility] [address], [place] - [address] [place] [address] Phone: [phone] Fax: [phone]     Social Drivers of Health (SDOH) Social History: SDOH  Screenings   Food Insecurity: Food Insecurity Present (08/05/2023)  Housing: Low Risk  (08/05/2023)  Transportation Needs: No Transportation Needs (08/05/2023)  Utilities: Not At Risk (08/05/2023)  Depression (PHQ2-9): Low Risk  (10/26/2021)  Financial Resource Strain: High Risk (07/26/2023)   Received from [facility]  Physical Activity: Sufficiently Active (11/29/2022)   Received from [facility]  Social Connections: Unknown (08/05/2023)  Stress: Stress Concern Present (11/29/2022)   [facility]  Tobacco Use: High Risk  (08/05/2023)  Health Literacy: Adequate Health Literacy (11/29/2022)   Received from [facility]   SDOH Interventions:     Readmission Risk Interventions     No data to display

## 2023-08-06 NOTE — Hospital Course (Addendum)
 Hospital course / significant events:   HPI: Michele [name] is a 58 y.o. female with a history of diabetes, HTn, HLD, migraines, vertigo, chronic pain who presents with weakness and aphasia.  The patient's last known well was around 11 PM 06/01 when she fell asleep.  She woke up at 445 06/02 w/ diffuse pain throughout her body which then became more on the left side involving her left arm and left leg, along with weakness.  At the same time she thought that she was yelling for someone to come help her, but she found that she actually was not able to speak normally.  In ed, symptoms were subsiding but still noting some difficulty speaking.   06/02: to ED from home via EMS. Code stroke, no TNK, symptoms subsided while in ED. CT head + CTA  H/N no concerns. Later she again developed left-sided weakness. Admitted to hospitalist service. Neuro consult - hx migraines, myofascial pain syndrome. Pt is attributing symptoms to migraine. Given negative MRI brain with ongoing symptoms, can reasonably r/o stroke or TIA. Low concern for any spinal cord process. Recs outpatient sleep study, use CPAP, trial lidocaine  gel to neck, follow outpatient neurology for trigger point injections, neurology s/o.  06/03:      Consultants:  none  Procedures/Surgeries: none      ASSESSMENT & PLAN:   Left-sided paresis - resolved Hx migraines - with negative imaging have resonably r/o CVA/TIA per neurology Outpatient neurology follow up re: trigger point injections Sleep study outpatient  lidocaine  gel to neck Neurology team has s/o  Echo ***  HTN Held home meds allowing for permissive HTN but since no CVA can restart  - Start as needed hydralazine   IDDM -Hold off metformin  -SSI   Peripheral neuropathy - Continue Lyrica  and Topamax   Morbid obesity - BMI 40, outpatient GLP-1 evaluation    *** based on BMI: Body mass index is 40.53 kg/m.Aaron Aas Significantly low or high BMI is associated with higher  medical risk.  Underweight - under 18  overweight - 25 to 29 obese - 30 or more Class 1 obesity: BMI of 30.0 to 34 Class 2 obesity: BMI of 35.0 to 39 Class 3 obesity: BMI of 40.0 to 49 Super Morbid Obesity: BMI 50-59 Super-super Morbid Obesity: BMI 60+ Healthy nutrition and physical activity advised as adjunct to other disease management and risk reduction treatments    DVT prophylaxis: *** IV fluids: *** continuous IV fluids  Nutrition: *** Central lines / other devices: ***  Code Status: *** ACP documentation reviewed: *** none on file in [facility]  TOC needs: *** Medical barriers to dispo: ***. Expected medical readiness for discharge ***.         Before starting Triptan's  Contraindications: vascular disease, HTN, pregnancy, prinzmetal's angina, CAD - consider EKG/Stress test Onset: rapid - consider nasal/sq therapy Recurrence: often/short recurrence - consider combo with NSAID, steroids, consider Group 2 triptan  Nausea/VOmiting: can they keep a pill down or should you consider nasal/sq  Triptan abortive therapy (Consider combo with NSAID), remember 10 days per month max Group 1 Sumatriptan (imitrex): higher potency, faster onset if subq/nasal, ok in breastfeeding  Rizatriptan (Maxalt) Zolmitriptan (Zomig): good for persistent headache Group 2 (slower onset and less potent but prevent recurrence better) Naratriptan (Amerge): Frovatriptan (Frova): slow  onset but decreased recurrence, good for patients who can identify triggers ahead of time   1/4 non-responders 1/3 pain free in 2 hours 1/4 remain pain-free  Raskin's Protocol for refractory  migraine: DHE 0.25 mg IV q8 h until resolution or until 4 total doses   Other refractory treatment/ Rescue protocols: Anticonvulsant: Depakote 1000 mg po qhs x5 days then 500 mg po qhs x5 days Magnesium 2 g IV Toradol  60 mg IM or 10 mg po tid Dexamethasone  4-10 mg IV Prednisone  60 mg po x5 days then 50 mg po x1 day, then 40  mg po x1 day, then 30 mg po x1 day, then 20 mg po x1 day, then 10 mg po x1 day Muscle relaxers Antipsychotics   Butalbital should only be used maximum 5 days per month or not at all  Medication Overuse Headache: Will get worse before better 4-8 weeks until recovery   Prevention: 4-6 weeks for any effect, 2-3 months for full effect  Level A Topamax Depakote Propranolol Butterbur  Level B Amitriptyline   Venlaxafine Atenolol Feverfew Level C ACE/ARB Other: Magnesium supplementation 400-800 mg/day Mag Citrate (not Mag Ox)

## 2023-08-06 NOTE — Discharge Summary (Signed)
 Physician Discharge Summary   Patient: Michele [name] MRN: [id number]  DOB: 15-Jul-1965   Admit:     Date of Admission: 08/05/2023 Admitted from: home   Discharge: Date of discharge: 08/06/23 Disposition: Home Condition at discharge: good  CODE STATUS: FULL CODE     Discharge Physician: Melodi Sprung, DO [facility]     PCP: Little Riff, MD  Recommendations for Outpatient Follow-up:  Follow up with PCP Little Riff, MD in 1-2 weeks Outpatient neurology follow up    Discharge Instructions     Diet - low sodium heart healthy   Complete by: As directed    Increase activity slowly   Complete by: As directed          Discharge Diagnoses: Principal Problem:   TIA (transient ischemic attack) Active Problems:   Paresthesia of left arm and leg        Hospital course / significant events:   HPI: Michele [name] is a 58 y.o. female with a history of diabetes, HTn, HLD, migraines, vertigo, chronic pain who presents with weakness and aphasia.  The patient's last known well was around 11 PM 06/01 when she fell asleep.  She woke up at 445 06/02 w/ diffuse pain throughout her body which then became more on the left side involving her left arm and left leg, along with weakness.  At the same time she thought that she was yelling for someone to come help her, but she found that she actually was not able to speak normally.  In ed, symptoms were subsiding but still noting some difficulty speaking.   06/02: to ED from home via EMS. Code stroke, no TNK, symptoms subsided while in ED. CT head + CTA  H/N no concerns. Later she again developed left-sided weakness. Admitted to hospitalist service. Neuro consult - hx migraines, myofascial pain syndrome. Pt is attributing symptoms to migraine. Given negative MRI brain with ongoing symptoms, can reasonably r/o stroke or TIA. Low concern for any spinal cord process. Recs outpatient sleep study, use CPAP, trial  lidocaine  gel to neck, follow outpatient neurology for trigger point injections, neurology s/o.  06/03: echo normal, MSK chest pain and no events on tele, stable for dc home to follow outpatient neurology      Consultants:  neurology  Procedures/Surgeries: none      ASSESSMENT & PLAN:   Left-sided paresis - resolved Hx migraines - with negative imaging have resonably r/o CVA/TIA per neurology Outpatient neurology follow up re: trigger point injections Sleep study outpatient  lidocaine  gel to neck Neurology team has s/o  Echo normal  HTN Held home meds allowing for permissive HTN but since no CVA can restart    IDDM Resume home meds   Peripheral neuropathy Continue Lyrica  and Topamax   Class 3 obesity based on BMI: Body mass index is 40.53 kg/m.Aaron Aas Significantly low or high BMI is associated with higher medical risk.  Underweight - under 18  overweight - 25 to 29 obese - 30 or more Class 1 obesity: BMI of 30.0 to 34 Class 2 obesity: BMI of 35.0 to 39 Class 3 obesity: BMI of 40.0 to 49 Super Morbid Obesity: BMI 50-59 Super-super Morbid Obesity: BMI 60+ Healthy nutrition and physical activity advised as adjunct to other disease management and risk reduction treatments          Discharge Instructions  Allergies as of 08/06/2023   No Known Allergies      Medication List  TAKE these medications    amitriptyline  50 MG tablet Commonly known as: ELAVIL  Take 50 mg by mouth at bedtime.   aspirin EC 81 MG tablet Take 1 tablet (81 mg total) by mouth daily. Swallow whole. Start taking on: August 07, 2023   atorvastatin 20 MG tablet Commonly known as: LIPITOR Take 20 mg by mouth daily at 6 PM.   baclofen  20 MG tablet Commonly known as: LIORESAL  Take 20 mg by mouth 3 (three) times daily.   butalbital-acetaminophen -caffeine 50-325-40 MG tablet Commonly known as: FIORICET Take 1 tablet by mouth every 6 (six) hours as needed for migraine.   diclofenac   Sodium 1 % Gel Commonly known as: VOLTAREN  Apply 2 g topically 4 (four) times daily.   fluticasone 50 MCG/ACT nasal spray Commonly known as: FLONASE Place 1 spray into both nostrils daily.   gabapentin  300 MG capsule Commonly known as: NEURONTIN  Take 1 capsule by mouth 2 (two) times daily.   glipiZIDE 10 MG 24 hr tablet Commonly known as: GLUCOTROL XL Take 10 mg by mouth daily.   ibuprofen  800 MG tablet Commonly known as: ADVIL  Take 800 mg by mouth 3 (three) times daily.   lidocaine  5 % Commonly known as: Lidoderm  Place 1 patch onto the skin every 12 (twelve) hours. Remove & Discard patch within 12 hours or as directed by MD   meclizine  25 MG tablet Commonly known as: ANTIVERT  Take 1 tablet (25 mg total) by mouth 3 (three) times daily as needed for dizziness.   metFORMIN  500 MG 24 hr tablet Commonly known as: GLUCOPHAGE -XR Take 1,000 mg by mouth every evening.   pantoprazole  20 MG tablet Commonly known as: Protonix  Take 1 tablet (20 mg total) by mouth daily.   Semaglutide (1 MG/DOSE) 4 MG/3ML Sopn Inject 1 mg into the skin once a week.   Semglee (yfgn) 100 UNIT/ML Pen Generic drug: insulin  glargine-yfgn Inject 22 Units into the skin at bedtime.   topiramate 100 MG tablet Commonly known as: TOPAMAX Take 100 mg by mouth 2 (two) times daily.   triamterene-hydrochlorothiazide 37.5-25 MG tablet Commonly known as: MAXZIDE-25 Take 1 tablet by mouth daily.   verapamil 180 MG CR tablet Commonly known as: CALAN-SR Take 180 mg by mouth daily.         Follow-up Information     Little Riff, MD Follow up.   Specialty: Internal Medicine Why: hospital follow up Contact information: [address] [facility] [place] [address] [phone]         [facility] Follow up.   Specialty: Rehabilitation Why: Facility will call to schedule the appointment. Contact information: [address] [place]  [place]  [phone]                No Known Allergies   Subjective: pt reports headache still present but improved, chest pain resolved but tender on palpation chest see below, no sob, no n/v,tolerating diet, ambulating   Discharge Exam: BP (!) 141/89 (BP Location: Right Arm)   Pulse 74   Temp 98.7 F (37.1 C)   Resp 18   Wt 113.9 kg   SpO2 98%   BMI 40.53 kg/m  General: Pt is alert, awake, not in acute distress Cardiovascular: RRR, S1/S2 +, no rubs, no gallops. Chest (+)tender to light palpation anterior Respiratory: CTA bilaterally, no wheezing, no rhonchi Abdominal: Soft, NT, ND, bowel sounds + Extremities: no edema, no cyanosis     The results of  significant diagnostics from this hospitalization (including imaging, microbiology, ancillary and laboratory) are listed below for reference.     Microbiology: No results found for this or any previous visit (from the past 240 hours).   Labs: BNP (last 3 results) No results for input(s): "BNP" in the last 8760 hours. Basic Metabolic Panel: Recent Labs  Lab 08/05/23 0542 08/05/23 1317  NA 141  --   K 3.1*  --   CL 105  --   CO2 29  --   GLUCOSE 101*  --   BUN 18  --   CREATININE 0.88  --   CALCIUM 9.0  --   MG  --  2.1   Liver Function Tests: Recent Labs  Lab 08/05/23 0542  AST 14*  ALT 14  ALKPHOS 64  BILITOT 0.3  PROT 6.1*  ALBUMIN 3.5   No results for input(s): "LIPASE", "AMYLASE" in the last 168 hours. No results for input(s): "AMMONIA" in the last 168 hours. CBC: Recent Labs  Lab 08/05/23 0542  WBC 7.7  NEUTROABS 3.8  HGB 12.3  HCT 37.9  MCV 81.5  PLT 287   Cardiac Enzymes: No results for input(s): "CKTOTAL", "CKMB", "CKMBINDEX", "TROPONINI" in the last 168 hours. BNP: Invalid input(s): "POCBNP" CBG: Recent Labs  Lab 08/05/23 1630 08/05/23 2036 08/06/23 0753 08/06/23 1123 08/06/23 1605  GLUCAP 131* 126* 113* 128* 156*   D-Dimer No results for input(s):  "DDIMER" in the last 72 hours. Hgb A1c Recent Labs    08/05/23 1317  HGBA1C 5.8*   Lipid Profile Recent Labs    08/06/23 0335  CHOL 105  HDL 47  LDLCALC 35  TRIG 116  CHOLHDL 2.2   Thyroid function studies No results for input(s): "TSH", "T4TOTAL", "T3FREE", "THYROIDAB" in the last 72 hours.  Invalid input(s): "FREET3" Anemia work up No results for input(s): "VITAMINB12", "FOLATE", "FERRITIN", "TIBC", "IRON", "RETICCTPCT" in the last 72 hours. Urinalysis    Component Value Date/Time   COLORURINE YELLOW (A) 07/24/2023 0050   APPEARANCEUR HAZY (A) 07/24/2023 0050   APPEARANCEUR Hazy 11/13/2013 2015   LABSPEC 1.016 07/24/2023 0050   LABSPEC 1.025 11/13/2013 2015   PHURINE 6.0 07/24/2023 0050   GLUCOSEU NEGATIVE 07/24/2023 0050   GLUCOSEU Negative 11/13/2013 2015   HGBUR NEGATIVE 07/24/2023 0050   BILIRUBINUR NEGATIVE 07/24/2023 0050   BILIRUBINUR Negative 11/13/2013 2015   KETONESUR NEGATIVE 07/24/2023 0050   PROTEINUR NEGATIVE 07/24/2023 0050   NITRITE NEGATIVE 07/24/2023 0050   LEUKOCYTESUR NEGATIVE 07/24/2023 0050   LEUKOCYTESUR Negative 11/13/2013 2015   Sepsis Labs Recent Labs  Lab 08/05/23 0542  WBC 7.7   Microbiology No results found for this or any previous visit (from the past 240 hours). Imaging DG Cervical Spine 2 or 3 views Result Date: 08/05/2023 CLINICAL DATA:  Cervical neck pain EXAM: CERVICAL SPINE - 2-3 VIEW COMPARISON:  None Available. FINDINGS: The skull base through C6 are visualized on the lateral view. C7 and the cervicothoracic junction are obscured by overlapping osseous and soft tissue structures. Straightening of normal lordosis. No evidence of fracture. The disc spaces are preserved. No evidence of focal bone abnormality or bone destruction. No prevertebral soft tissue thickening. IMPRESSION: 1. Straightening of normal lordosis. No acute osseous abnormality. 2. C7 and the cervicothoracic junction are obscured by overlapping osseous and soft  tissue structures and not well assessed. Electronically Signed   By: Chadwick Colonel M.D.   On: 08/05/2023 15:18   MR BRAIN WO CONTRAST Result Date: 08/05/2023 CLINICAL DATA:  58 year old female who woke up at 0400 hours with left face and extremity weakness. Abnormal speech. Code stroke presentation. EXAM: MRI HEAD WITHOUT CONTRAST TECHNIQUE: Multiplanar, multiecho pulse sequences of the brain and surrounding structures were obtained without intravenous contrast. COMPARISON:  CT head, CTA and CTP this morning. Previous brain MRI 07/24/2023. FINDINGS: Brain: Normal cerebral volume for age. No restricted diffusion to suggest acute infarction. No midline shift, mass effect, evidence of mass lesion, ventriculomegaly, extra-axial collection or acute intracranial hemorrhage. Cervicomedullary junction and pituitary are within normal limits. Martina Sledge and white matter signal is stable and within normal limits for age. Minimal nonspecific white matter T2 and FLAIR hyperintensity (such as on series 25, image 30). No cortical encephalomalacia or chronic cerebral blood products. Deep gray nuclei, brainstem and cerebellum appear negative. Vascular: Major intracranial vascular flow voids are stable. There is a degree of generalized intracranial artery tortuosity. Skull and upper cervical spine: Negative. Visualized bone marrow signal is within normal limits. Sinuses/Orbits: Negative. Other: Mastoids are clear. Grossly normal visible internal auditory structures. Negative visible scalp and face. IMPRESSION: No acute intracranial abnormality. Stable from last month, mild for age nonspecific white matter signal changes. Electronically Signed   By: Marlise Simpers M.D.   On: 08/05/2023 08:51   CT ANGIO HEAD NECK W WO CM W PERF (CODE STROKE) Result Date: 08/05/2023 CLINICAL DATA:  59 year old female code stroke presentation. Left side deficits. EXAM: CT ANGIOGRAPHY HEAD AND NECK CT PERFUSION BRAIN TECHNIQUE: Multidetector CT imaging of the  head and neck was performed using the standard protocol during bolus administration of intravenous contrast. Multiplanar CT image reconstructions and MIPs were obtained to evaluate the vascular anatomy. Carotid stenosis measurements (when applicable) are obtained utilizing NASCET criteria, using the distal internal carotid diameter as the denominator. Multiphase CT imaging of the brain was performed following IV bolus contrast injection. Subsequent parametric perfusion maps were calculated using RAPID software. RADIATION DOSE REDUCTION: This exam was performed according to the departmental dose-optimization program which includes automated exposure control, adjustment of the mA and/or kV according to patient size and/or use of iterative reconstruction technique. CONTRAST:  OMNIPAQUE  IOHEXOL  350 MG/ML SOLN COMPARISON:  Plain head CT 0550 hours today. CTA head and neck, brain MRI 07/24/2023. FINDINGS: CT Brain Perfusion Findings: ASPECTS: 10 CBF (<30%) Volume: 0mL Perfusion (Tmax>6.0s) volume: 0mL Mismatch Volume: Not applicablemL Infarction Location:Not applicable CTA NECK Skeleton: Recent maxillary dental extractions, absent dentition. Similar reversal of cervical lordosis. No acute osseous abnormality identified. Upper chest: Negative. Other neck: Nonvascular neck soft tissue spaces are stable and within normal limits. Aortic arch: Aberrant origin right subclavian artery. Mildly tortuous aortic arch without plaque. Right carotid system: Tortuous proximal right CCA. Normal right carotid bifurcation. Tortuous right ICA below the skull base without plaque or stenosis. Left carotid system: Similar tortuosity. No significant carotid bifurcation plaque or stenosis. Vertebral arteries: Aberrant origin proximal right subclavian artery without stenosis. Normal right vertebral artery origin. Non dominant right vertebral artery is stable with V2 segment tortuosity, patent to the skull base without stenosis. Proximal  left subclavian artery and left vertebral artery origin are normal. Dominant left vertebral artery is patent and normal to the skull base. CTA HEAD Posterior circulation: Patent distal vertebral arteries and vertebrobasilar junction without stenosis. Dominant, mildly tortuous left V4 segment. Patent PICA origins. Patent basilar artery with tortuosity. Normal SCA and PCA origins. Small posterior communicating arteries. Bilateral PCA branches are within normal limits, mildly tortuous. Anterior circulation: Both ICA siphons are patent. No right siphon plaque  or stenosis. Minimal left siphon calcified plaque without stenosis. Normal posterior communicating artery origins. Patent carotid termini. Normal MCA and ACA origins. Mildly dominant right A1. Diminutive anterior communicating artery. Bilateral ACA branches are within normal limits. Left MCA M1 segment and bifurcation are patent without stenosis. Right MCA M1 segment and bifurcation are patent without stenosis. Bilateral MCA branches are stable and within normal limits. Venous sinuses: Patent. Anatomic variants: Dominant left and diminutive right vertebral arteries. Aberrant right subclavian artery origin. Mildly dominant right A1. Review of the MIP images confirms the above findings IMPRESSION: 1. Negative CT Perfusion. 2. Stable CTA Head and Neck with no large vessel occlusion and no significant atherosclerosis. Electronically Signed   By: Marlise Simpers M.D.   On: 08/05/2023 06:31   CT HEAD CODE STROKE WO CONTRAST Addendum Date: 08/05/2023 ADDENDUM REPORT: 08/05/2023 06:08 ADDENDUM: Study discussed by telephone with Dr. Lind Repine on 08/05/2023 at 0559 hours. Electronically Signed   By: Marlise Simpers M.D.   On: 08/05/2023 06:08   Result Date: 08/05/2023 CLINICAL DATA:  Code stroke. 58 year old female who woke with left side symptoms. Weakness, aphasia. EXAM: CT HEAD WITHOUT CONTRAST TECHNIQUE: Contiguous axial images were obtained from the base of the skull through  the vertex without intravenous contrast. RADIATION DOSE REDUCTION: This exam was performed according to the departmental dose-optimization program which includes automated exposure control, adjustment of the mA and/or kV according to patient size and/or use of iterative reconstruction technique. COMPARISON:  Brain MRI, head CT 07/24/2023. FINDINGS: Brain: Normal cerebral volume. No midline shift, ventriculomegaly, mass effect, evidence of mass lesion, intracranial hemorrhage or evidence of cortically based acute infarction. Gray-white matter differentiation is within normal limits throughout the brain. Vascular: No suspicious intracranial vascular hyperdensity. Intracranial artery tortuosity better demonstrated on the recent MRI. Skull: Intact.  No acute osseous abnormality identified. Sinuses/Orbits: Visualized paranasal sinuses and mastoids are stable and well aerated. Other: No gaze deviation now. Visualized orbits and scalp soft tissues are within normal limits. ASPECTS [facility] Stroke Program Early CT Score) Total score (0-10 with 10 being normal): 10 IMPRESSION: Stable and Normal noncontrast CT appearance of the brain. ASPECTS 10. Electronically Signed: By: Marlise Simpers M.D. On: 08/05/2023 05:57      Time coordinating discharge: over 30 minutes  SIGNED:  Shonique Pelphrey DO [facility]

## 2023-08-07 ENCOUNTER — Other Ambulatory Visit: Payer: Self-pay

## 2023-08-07 ENCOUNTER — Observation Stay
Admission: EM | Admit: 2023-08-07 | Discharge: 2023-08-10 | Disposition: A | Discharge status: Home / Self Care | Attending: Internal Medicine | Admitting: Internal Medicine

## 2023-08-07 ENCOUNTER — Encounter: Payer: Self-pay | Admitting: Emergency Medicine

## 2023-08-07 DIAGNOSIS — E119 Type 2 diabetes mellitus without complications: Secondary | ICD-10-CM | POA: Diagnosis not present

## 2023-08-07 DIAGNOSIS — Z8669 Personal history of other diseases of the nervous system and sense organs: Secondary | ICD-10-CM | POA: Diagnosis not present

## 2023-08-07 DIAGNOSIS — Z6838 Body mass index (BMI) 38.0-38.9, adult: Secondary | ICD-10-CM | POA: Insufficient documentation

## 2023-08-07 DIAGNOSIS — Z794 Long term (current) use of insulin: Secondary | ICD-10-CM

## 2023-08-07 DIAGNOSIS — K219 Gastro-esophageal reflux disease without esophagitis: Secondary | ICD-10-CM | POA: Diagnosis not present

## 2023-08-07 DIAGNOSIS — Z79899 Other long term (current) drug therapy: Secondary | ICD-10-CM | POA: Insufficient documentation

## 2023-08-07 DIAGNOSIS — I10 Essential (primary) hypertension: Secondary | ICD-10-CM | POA: Diagnosis not present

## 2023-08-07 DIAGNOSIS — E669 Obesity, unspecified: Secondary | ICD-10-CM

## 2023-08-07 DIAGNOSIS — F1090 Alcohol use, unspecified, uncomplicated: Secondary | ICD-10-CM | POA: Diagnosis not present

## 2023-08-07 DIAGNOSIS — R42 Dizziness and giddiness: Secondary | ICD-10-CM | POA: Diagnosis present

## 2023-08-07 DIAGNOSIS — H6691 Otitis media, unspecified, right ear: Secondary | ICD-10-CM | POA: Diagnosis not present

## 2023-08-07 DIAGNOSIS — G43809 Other migraine, not intractable, without status migrainosus: Secondary | ICD-10-CM

## 2023-08-07 DIAGNOSIS — R531 Weakness: Secondary | ICD-10-CM | POA: Diagnosis not present

## 2023-08-07 DIAGNOSIS — E785 Hyperlipidemia, unspecified: Secondary | ICD-10-CM

## 2023-08-07 DIAGNOSIS — E876 Hypokalemia: Secondary | ICD-10-CM | POA: Diagnosis not present

## 2023-08-07 DIAGNOSIS — Z7901 Long term (current) use of anticoagulants: Secondary | ICD-10-CM | POA: Insufficient documentation

## 2023-08-07 DIAGNOSIS — Z8673 Personal history of transient ischemic attack (TIA), and cerebral infarction without residual deficits: Secondary | ICD-10-CM | POA: Insufficient documentation

## 2023-08-07 DIAGNOSIS — R519 Headache, unspecified: Secondary | ICD-10-CM

## 2023-08-07 LAB — CBC WITH DIFFERENTIAL/PLATELET
Abs Immature Granulocytes: 0.02 10*3/uL (ref 0.00–0.07)
Basophils Absolute: 0.1 10*3/uL (ref 0.0–0.1)
Basophils Relative: 1 %
Eosinophils Absolute: 0.3 10*3/uL (ref 0.0–0.5)
Eosinophils Relative: 4 %
HCT: 39 % (ref 36.0–46.0)
Hemoglobin: 12.2 g/dL (ref 12.0–15.0)
Immature Granulocytes: 0 %
Lymphocytes Relative: 35 %
Lymphs Abs: 2.4 10*3/uL (ref 0.7–4.0)
MCH: 25.7 pg — ABNORMAL LOW (ref 26.0–34.0)
MCHC: 31.3 g/dL (ref 30.0–36.0)
MCV: 82.3 fL (ref 80.0–100.0)
Monocytes Absolute: 0.5 10*3/uL (ref 0.1–1.0)
Monocytes Relative: 7 %
Neutro Abs: 3.6 10*3/uL (ref 1.7–7.7)
Neutrophils Relative %: 53 %
Platelets: 281 10*3/uL (ref 150–400)
RBC: 4.74 MIL/uL (ref 3.87–5.11)
RDW: 13.3 % (ref 11.5–15.5)
WBC: 6.9 10*3/uL (ref 4.0–10.5)
nRBC: 0 % (ref 0.0–0.2)

## 2023-08-07 LAB — TROPONIN I (HIGH SENSITIVITY)
Troponin I (High Sensitivity): 3 ng/L (ref <18)
Troponin I (High Sensitivity): 3 ng/L (ref <18)

## 2023-08-07 LAB — BASIC METABOLIC PANEL WITH GFR
Anion gap: 6 (ref 5–15)
BUN: 12 mg/dL (ref 6–20)
CO2: 26 mmol/L (ref 22–32)
Calcium: 9 mg/dL (ref 8.9–10.3)
Chloride: 109 mmol/L (ref 98–111)
Creatinine, Ser: 0.83 mg/dL (ref 0.44–1.00)
GFR, Estimated: >60 mL/min (ref >60)
Glucose, Bld: 101 mg/dL — ABNORMAL HIGH (ref 70–99)
Potassium: 3.5 mmol/L (ref 3.5–5.1)
Sodium: 141 mmol/L (ref 135–145)

## 2023-08-07 LAB — GLUCOSE, CAPILLARY
Glucose-Capillary: 124 mg/dL — ABNORMAL HIGH (ref 70–99)
Glucose-Capillary: 125 mg/dL — ABNORMAL HIGH (ref 70–99)

## 2023-08-07 MED ORDER — INSULIN ASPART 100 UNIT/ML IJ SOLN
0.0000 [IU] | Freq: Three times a day (TID) | INTRAMUSCULAR | Status: DC
  Administered 2023-08-08: 2 [IU] via SUBCUTANEOUS
  Administered 2023-08-08 – 2023-08-10 (×3): 1 [IU] via SUBCUTANEOUS
  Filled 2023-08-07 (×5): qty 1

## 2023-08-07 MED ORDER — ASPIRIN 81 MG PO TBEC
81.0000 mg | DELAYED_RELEASE_TABLET | Freq: Every day | ORAL | Status: DC
  Administered 2023-08-07 – 2023-08-10 (×4): 81 mg via ORAL
  Filled 2023-08-07 (×4): qty 1

## 2023-08-07 MED ORDER — MECLIZINE HCL 25 MG PO TABS
25.0000 mg | ORAL_TABLET | Freq: Three times a day (TID) | ORAL | Status: AC
  Administered 2023-08-07 – 2023-08-10 (×9): 25 mg via ORAL
  Filled 2023-08-07 (×9): qty 1

## 2023-08-07 MED ORDER — ACETAMINOPHEN 650 MG RE SUPP: 650.0000 mg | Freq: Four times a day (QID) | RECTAL | Status: DC | PRN

## 2023-08-07 MED ORDER — BISACODYL 5 MG PO TBEC
5.0000 mg | DELAYED_RELEASE_TABLET | Freq: Every day | ORAL | Status: DC | PRN
  Administered 2023-08-08: 5 mg via ORAL

## 2023-08-07 MED ORDER — METOCLOPRAMIDE HCL 5 MG/ML IJ SOLN
10.0000 mg | Freq: Once | INTRAMUSCULAR | Status: AC
  Administered 2023-08-07: 10 mg via INTRAVENOUS
  Filled 2023-08-07: qty 2

## 2023-08-07 MED ORDER — MECLIZINE HCL 25 MG PO TABS
25.0000 mg | ORAL_TABLET | Freq: Once | ORAL | Status: AC
  Administered 2023-08-07: 25 mg via ORAL
  Filled 2023-08-07: qty 1

## 2023-08-07 MED ORDER — BUTALBITAL-APAP-CAFFEINE 50-325-40 MG PO TABS
1.0000 | ORAL_TABLET | Freq: Four times a day (QID) | ORAL | Status: DC | PRN
Start: 2023-08-07 — End: 2023-08-10
  Administered 2023-08-07 – 2023-08-10 (×6): 1 via ORAL
  Filled 2023-08-07 (×6): qty 1

## 2023-08-07 MED ORDER — ATORVASTATIN CALCIUM 20 MG PO TABS
20.0000 mg | ORAL_TABLET | Freq: Every day | ORAL | Status: DC
  Administered 2023-08-07 – 2023-08-09 (×3): 20 mg via ORAL
  Filled 2023-08-07 (×3): qty 1

## 2023-08-07 MED ORDER — INSULIN ASPART 100 UNIT/ML IJ SOLN: 0.0000 [IU] | Freq: Every day | INTRAMUSCULAR | Status: DC

## 2023-08-07 MED ORDER — VERAPAMIL HCL ER 180 MG PO TBCR
180.0000 mg | EXTENDED_RELEASE_TABLET | Freq: Every day | ORAL | Status: DC
  Administered 2023-08-08 – 2023-08-10 (×3): 180 mg via ORAL
  Filled 2023-08-07 (×3): qty 1

## 2023-08-07 MED ORDER — POLYETHYLENE GLYCOL 3350 17 G PO PACK: 17.0000 g | PACK | Freq: Every day | ORAL | Status: DC | PRN

## 2023-08-07 MED ORDER — HYDRALAZINE HCL 20 MG/ML IJ SOLN: 10.0000 mg | Freq: Four times a day (QID) | INTRAMUSCULAR | Status: DC | PRN

## 2023-08-07 MED ORDER — DIPHENHYDRAMINE HCL 50 MG/ML IJ SOLN
25.0000 mg | Freq: Once | INTRAMUSCULAR | Status: AC
  Administered 2023-08-07: 25 mg via INTRAVENOUS
  Filled 2023-08-07: qty 1

## 2023-08-07 MED ORDER — ENOXAPARIN SODIUM 60 MG/0.6ML IJ SOSY
50.0000 mg | PREFILLED_SYRINGE | INTRAMUSCULAR | Status: DC
  Administered 2023-08-08 – 2023-08-09 (×2): 50 mg via SUBCUTANEOUS
  Filled 2023-08-07 (×4): qty 0.6

## 2023-08-07 MED ORDER — PANTOPRAZOLE SODIUM 20 MG PO TBEC
20.0000 mg | DELAYED_RELEASE_TABLET | Freq: Every day | ORAL | Status: DC
  Administered 2023-08-08 – 2023-08-10 (×3): 20 mg via ORAL
  Filled 2023-08-07 (×3): qty 1

## 2023-08-07 MED ORDER — ALBUTEROL SULFATE (2.5 MG/3ML) 0.083% IN NEBU: 2.5000 mg | INHALATION_SOLUTION | Freq: Four times a day (QID) | RESPIRATORY_TRACT | Status: DC | PRN

## 2023-08-07 MED ORDER — TOPIRAMATE 100 MG PO TABS
100.0000 mg | ORAL_TABLET | Freq: Two times a day (BID) | ORAL | Status: DC
  Administered 2023-08-07 – 2023-08-10 (×6): 100 mg via ORAL
  Filled 2023-08-07 (×6): qty 1

## 2023-08-07 MED ORDER — ACETAMINOPHEN 325 MG PO TABS: 650.0000 mg | ORAL_TABLET | Freq: Four times a day (QID) | ORAL | Status: DC | PRN

## 2023-08-07 MED ORDER — PROCHLORPERAZINE EDISYLATE 10 MG/2ML IJ SOLN
10.0000 mg | Freq: Once | INTRAMUSCULAR | Status: AC
  Administered 2023-08-07: 10 mg via INTRAVENOUS
  Filled 2023-08-07: qty 2

## 2023-08-07 NOTE — ED Provider Notes (Signed)
 [facility] Provider Note    Event Date/Time   First MD Initiated Contact with Patient 08/07/23 [phone]     (approximate)   History   Dizziness   HPI  Stacie Templin is a 58 y.o. female with a history of diabetes, hypertension, hyperlipidemia, migraines, vertigo, and chronic pain who presents with multiple complaints, primarily dizziness.  The patient describes the dizziness as a sensation of everything spinning or moving.  It is worse if she tries to get up or move around.  It is worse if she opens her eyes.  It is somewhat improved if she closes her eyes and is still.  She reports an associated headache.  She also has generalized weakness and fatigue, as well as intermittent speech difficulty with inability to find certain words.  She states that these are all the symptoms that she presented with a few days ago when she was evaluated for possible stroke or TIA.  She states that this dizziness is different than her typical vertigo, because with her typical vertigo, if she takes meclizine , it gets better.  However she has not tried to take meclizine  for this episode.  I reviewed the past medical records.  The patient was just admitted to the hospital service from 6/2 and discharged yesterday after presenting with weakness and aphasia.  CT head, CT angiogram head/neck, and MRI of the brain were all negative for acute findings.  She was ruled out for stroke or TIA.   Physical Exam   Triage Vital Signs: ED Triage Vitals  Encounter Vitals Group     BP 08/07/23 0825 127/79     Systolic BP Percentile --      Diastolic BP Percentile --      Pulse Rate 08/07/23 0825 77     Resp 08/07/23 0825 18     Temp 08/07/23 0825 99.1 F (37.3 C)     Temp Source 08/07/23 0825 Oral     SpO2 08/07/23 0825 98 %     Weight 08/07/23 0826 237 lb (107.5 kg)     Height 08/07/23 0826 5\' 6"  (1.676 m)     Head Circumference --      Peak Flow --      Pain Score 08/07/23 0826 9     Pain  Loc --      Pain Education --      Exclude from Growth Chart --     Most recent vital signs: Vitals:   08/07/23 1230 08/07/23 1429  BP:  128/73  Pulse: 62 63  Resp: 17 17  Temp:  97.7 F (36.5 C)  SpO2: 100% 100%     General: Alert, tired appearing, no distress.  CV:  Good peripheral perfusion.  Resp:  Normal effort.  Abd:  No distention.  Other:  EOMI.  PERRLA.  No significant photophobia.  No facial droop.  Normal speech.  Motor intact in all extremities.  No ataxia on finger-to-nose.   ED Results / Procedures / Treatments   Labs (all labs ordered are listed, but only abnormal results are displayed) Labs Reviewed  BASIC METABOLIC PANEL WITH GFR - Abnormal; Notable for the following components:      Result Value   Glucose, Bld 101 (*)    All other components within normal limits  CBC WITH DIFFERENTIAL/PLATELET - Abnormal; Notable for the following components:   MCH 25.7 (*)    All other components within normal limits  URINALYSIS, ROUTINE W REFLEX MICROSCOPIC  TROPONIN  I (HIGH SENSITIVITY)  TROPONIN I (HIGH SENSITIVITY)     EKG  ED ECG REPORT I, Lind Repine, the attending physician, personally viewed and interpreted this ECG.  Date: 08/07/2023 EKG Time: 0828 Rate: 77 Rhythm: normal sinus rhythm QRS Axis: normal Intervals: normal ST/T Wave abnormalities: Nonspecific T wave abnormalities Narrative Interpretation: no evidence of acute ischemia    RADIOLOGY     PROCEDURES:  Critical Care performed: No  Procedures   MEDICATIONS ORDERED IN ED: Medications  insulin  aspart (novoLOG ) injection 0-9 Units (has no administration in time range)  insulin  aspart (novoLOG ) injection 0-5 Units (has no administration in time range)  acetaminophen  (TYLENOL ) tablet 650 mg (has no administration in time range)    Or  acetaminophen  (TYLENOL ) suppository 650 mg (has no administration in time range)  polyethylene glycol (MIRALAX / GLYCOLAX) packet 17 g  (has no administration in time range)  bisacodyl (DULCOLAX) EC tablet 5 mg (has no administration in time range)  albuterol  (PROVENTIL ) (2.5 MG/3ML) 0.083% nebulizer solution 2.5 mg (has no administration in time range)  hydrALAZINE (APRESOLINE) injection 10 mg (has no administration in time range)  enoxaparin (LOVENOX) injection 50 mg (has no administration in time range)  meclizine  (ANTIVERT ) tablet 25 mg (has no administration in time range)  butalbital-acetaminophen -caffeine (FIORICET) 50-325-40 MG per tablet 1 tablet (has no administration in time range)  aspirin EC tablet 81 mg (has no administration in time range)  atorvastatin (LIPITOR) tablet 20 mg (has no administration in time range)  pantoprazole  (PROTONIX ) EC tablet 20 mg (has no administration in time range)  topiramate (TOPAMAX) tablet 100 mg (has no administration in time range)  verapamil (CALAN-SR) CR tablet 180 mg (has no administration in time range)  metoCLOPramide  (REGLAN ) injection 10 mg (10 mg Intravenous Given 08/07/23 0906)  diphenhydrAMINE  (BENADRYL ) injection 25 mg (25 mg Intravenous Given 08/07/23 0909)  meclizine  (ANTIVERT ) tablet 25 mg (25 mg Oral Given 08/07/23 1138)  prochlorperazine  (COMPAZINE ) injection 10 mg (10 mg Intravenous Given 08/07/23 1138)     IMPRESSION / MDM / ASSESSMENT AND PLAN / ED COURSE  I reviewed the triage vital signs and the nursing notes.  58 year old female with PMH as noted above presents with vertigo type dizziness associated with headache, fatigue/generalized weakness, and some difficulty with word finding that have persisted over the last couple of days since the patient was admitted for a stroke rule out.  She says a few contradictory things, stating that this was the same thing she just came in with to the ED, however I saw her on that visit and the primary complaint when she presented on 6/2 was left-sided upper and lower extremity pain and weakness and aphasia.  She did not report  dizziness in the ED at that time.  She also states that it is different than her typical vertigo although cannot specifically identify how, other than the fact it is not getting better.    On exam, her vital signs are normal.  Neurologic exam is nonfocal although the patient appears fatigued.  Differential diagnosis includes, but is not limited to, peripheral vertigo, migraine.  I have a very low suspicion for CVA/TIA given her negative workup and the lack of any new symptoms.  We will obtain lab workup, treat symptomatically with Reglan  and Benadryl , and discussed with neurology.  Patient's presentation is most consistent with acute presentation with potential threat to life or bodily function.  The patient is on the cardiac monitor to evaluate for evidence of arrhythmia and/or significant  heart rate changes.  ----------------------------------------- 1:11 PM on 08/07/2023 -----------------------------------------  Lab workup is unremarkable.  CBC and BMP show no acute findings.  Troponin is negative.  I consulted and discussed case with Dr. Cleone Dad from neurology who does not recommend additional imaging or workup given that the patient's symptoms have been present since she was evaluated for CVA a few days ago.  On reassessment, the patient has had some improvement but reports continued severe vertigo and some headache.  She does not feel well enough to go home.  I consulted and discussed the case with Dr. Gwynneth Lessen from the hospitalist service; based on our discussion he agrees to evaluate the patient for admission.   FINAL CLINICAL IMPRESSION(S) / ED DIAGNOSES   Final diagnoses:  Vertigo  Other migraine without status migrainosus, not intractable     Rx / DC Orders   ED Discharge Orders     None        Note:  This document was prepared using Dragon voice recognition software and may include unintentional dictation errors.    Lind Repine, MD 08/07/23 [phone]

## 2023-08-07 NOTE — ED Triage Notes (Signed)
 Pt via POV from home. Pt c/o unable to keep her balance and dizziness, states she was d/c from the hospital yesterday for a TIA, states she had these symptoms when she was d/c but symptoms have gotten worse. Pt c/o headache. Denies any other symptoms. Pt is A&Ox4 and NAD

## 2023-08-07 NOTE — Progress Notes (Signed)
 Patient refused novolog  this evening. She took 22 units of her long acting insulin  this morning. I explained she will not be received it tomorrow and will only be receiving the novolog  in the hospital.

## 2023-08-07 NOTE — ED Notes (Signed)
 Pt endorses ongoing dizziness, lightheadedness and headache since discharge. Pt states last night around 1800 symptoms worsened. Dizziness now affecting ambulation, pt having to hold on to objects to ambulate.

## 2023-08-07 NOTE — ED Notes (Signed)
States unable to provide urine sample at this time.

## 2023-08-07 NOTE — ED Notes (Signed)
 MD at the bedside

## 2023-08-07 NOTE — H&P (Signed)
 [facility] History and Physical  Disa Riedlinger ZOX:[id number] DOB: [birth year] DOA: 08/07/2023 PCP: Little Riff, MD  Presented from: home Chief Complaint: Vertigo  History of Present Illness: Michele [name] is a 58 y.o. female with PMH significant for DM2, hypertension, HLD, migraine, was recently overnight in the hospital 6/2 to 6/3 for TIA symptoms.  She had left-sided, aphasia which subsided while in the ED.  Was seen by neurology.  CT head, CTA head and neck and MRI brain were unremarkable.  She had dizziness and hence started and discharged on meclizine  as needed. Patient returned back to ED today within 24 hours of discharge with complaint of persistent dizziness, unable to keep her balance, headache.  Afebrile, hemodynamically stable CBC, BMP unremarkable, troponin not elevated. EDP did curbside discussion with neurologist Dr. Cleone Dad who did not see the need of repeat imaging as they were all done in the last 48 hours only. Patient was given IV Reglan , IV Benadryl , IV Compazine  and meclizine  despite which she continued to have dizziness and hence decision was made to observe overnight..  At the time of my evaluation, patient was lying on bed. Drowsy and slightly slow to move because of the meds she got earlier in the ED.  Family members at bedside. She states she still feels the room moving sensation.  No nausea or vomiting.  Tried eating earlier did not like the taste. History reviewed and detailed as above.   Review of Systems:  All systems were reviewed and were negative unless otherwise mentioned in the HPI   Past medical history: Past Medical History:  Diagnosis Date   Diabetes mellitus without complication (HCC)    High cholesterol    Hypertension    Migraine headache    Migraines     Past surgical history: Past Surgical History:  Procedure Laterality Date   ABDOMINAL HYSTERECTOMY     ANKLE SURGERY     CESAREAN SECTION     KNEE SURGERY      WRIST SURGERY      Social History:  reports that she has never smoked. Her smokeless tobacco use includes snuff. She reports current alcohol use. She reports that she does not use drugs.  Allergies:  No Known Allergies Patient has no known allergies.   Family history:  Family History  Problem Relation Age of Onset   Breast cancer Neg Hx      Physical Exam: Vitals:   08/07/23 1030 08/07/23 1145 08/07/23 1230 08/07/23 1429  BP: 129/82   128/73  Pulse: 69 62 62 63  Resp: 19 16 17 17   Temp:    97.7 F (36.5 C)  TempSrc:      SpO2: 98% 100% 100% 100%  Weight:      Height:       Wt Readings from Last 3 Encounters:  08/07/23 107.5 kg  08/05/23 113.9 kg  07/23/23 106.6 kg   Body mass index is 38.25 kg/m.  General exam: Pleasant, middle-aged African-American female.  Drowsy, not in distress. Skin: No rashes, lesions or ulcers. HEENT: Atraumatic, normocephalic, no obvious bleeding Lungs: Clear to auscultation bilaterally,  CVS: S1, S2, no murmur,   GI/Abd: Soft, nontender, nondistended, bowel sound present,   CNS: From the effect of sedatives given earlier, hospitalized to answer commands.  Slow to move.  No focal deficit noted.  No cerebellar signs of discoordination noted. Psychiatry: Mood appropriate,  Extremities: No pedal edema, no calf tenderness,    ----------------------------------------------------------------------------------------------------------------------------------------- ----------------------------------------------------------------------------------------------------------------------------------------- -----------------------------------------------------------------------------------------------------------------------------------------  Assessment/Plan: Principal  Problem:   Vertigo  Persistent vertigo BPPV versus polypharmacy.   Will get her seen by vestibular PT Meclizine  scheduled 25 mg 3 times daily for next 3 days She is on multiple  neuro psych meds at home including amitriptyline  50 mg nightly, baclofen  20 mg 3 times daily, gabapentin  300 mg twice daily, Topamax 100 mg twice daily. I wonder if her symptoms are due to polypharmacy.  Continue Topamax and keep others on hold.   Recent TIA Was in the hospital 6/2 to 6/3 for TIA symptoms.  She had left-sided, aphasia which subsided while in the ED.  Was seen by neurology.  CT head, CTA head and neck and MRI brain were unremarkable.  EDP discussed with neurologist today.  No need to repeat imagings at this time. Continue to monitor neurological symptoms. Continue aspirin, statin,  Type 2 diabetes mellitus A1c 5.8 in 08/05/2023 PTA meds-glipizide 10 mg daily, metformin  1000 mg daily, Semglee weekly, Started SSI/Accu-Cheks. Once appetite picks up, resume oral meds Recent Labs  Lab 08/05/23 1630 08/05/23 2036 08/06/23 0753 08/06/23 1123 08/06/23 1605  GLUCAP 131* 126* 113* 128* 156*   Hypertension PTA meds-verapamil 180 mg daily, triamterene 37.5 mg daily, HCTZ 25 mg daily Blood pressure currently normal range.  Resume verapamil.  Keep diuretics on hold.  Obtain orthostatic hypotension   HLD Statin  H/o migraine Topamax twice daily, as needed Fioricet, as needed Imitrex  GERD PPI  Mobility: PT eval  Goals of care:   Code Status: Full Code    DVT prophylaxis:     Antimicrobials: None Fluid: None Consultants: None Family Communication: Family at bedside  Status: Observation Level of care:  Telemetry Medical   Patient is from: Home Anticipated d/c to: Hopefully home in 1 to 2 days  Diet:  Diet Order             Diet heart healthy/carb modified Room service appropriate? Yes; Fluid consistency: Thin  Diet effective now                    ------------------------------------------------------------------------------------- Severity of Illness: The appropriate patient status for this patient is OBSERVATION. Observation status is judged to  be reasonable and necessary in order to provide the required intensity of service to ensure the patient's safety. The patient's presenting symptoms, physical exam findings, and initial radiographic and laboratory data in the context of their medical condition is felt to place them at decreased risk for further clinical deterioration. Furthermore, it is anticipated that the patient will be medically stable for discharge from the hospital within 2 midnights of admission.  -------------------------------------------------------------------------------------   Home Meds: Prior to Admission medications   Medication Sig Start Date End Date Taking? Authorizing Provider  amitriptyline  (ELAVIL ) 50 MG tablet Take 50 mg by mouth at bedtime. 12/09/18   [provider]  aspirin EC 81 MG tablet Take 1 tablet (81 mg total) by mouth daily. Swallow whole. 08/07/23   Alexander, Natalie, DO  atorvastatin (LIPITOR) 20 MG tablet Take 20 mg by mouth daily at 6 PM.    [provider]  baclofen  (LIORESAL ) 20 MG tablet Take 20 mg by mouth 3 (three) times daily. 07/26/23 08/25/23  [provider]  butalbital-acetaminophen -caffeine (FIORICET) 50-325-40 MG tablet Take 1 tablet by mouth every 6 (six) hours as needed for migraine. 08/06/23   Alexander, Natalie, DO  diclofenac  Sodium (VOLTAREN ) 1 % GEL Apply 2 g topically 4 (four) times daily. 11/22/22 11/22/23  [provider]  fluticasone (FLONASE) 50  MCG/ACT nasal spray Place 1 spray into both nostrils daily. 05/30/23   [provider]  gabapentin  (NEURONTIN ) 300 MG capsule Take 1 capsule by mouth 2 (two) times daily. 07/26/23   [provider]  glipiZIDE (GLUCOTROL XL) 10 MG 24 hr tablet Take 10 mg by mouth daily. 03/02/22   [provider]  ibuprofen  (ADVIL ) 800 MG tablet Take 800 mg by mouth 3 (three) times daily. 06/03/23   [provider]  lidocaine  (LIDODERM ) 5 % Place 1 patch onto the skin every 12 (twelve)  hours. Remove & Discard patch within 12 hours or as directed by MD 12/16/22 12/16/23  Twilla Galea, MD  meclizine  (ANTIVERT ) 25 MG tablet Take 1 tablet (25 mg total) by mouth 3 (three) times daily as needed for dizziness. 02/17/23   Lubertha Rush, MD  metFORMIN  (GLUCOPHAGE -XR) 500 MG 24 hr tablet Take 1,000 mg by mouth every evening. 02/05/22   [provider]  pantoprazole  (PROTONIX ) 20 MG tablet Take 1 tablet (20 mg total) by mouth daily. 05/13/19 10/26/21  Bryson Carbine, MD  Semaglutide, 1 MG/DOSE, 4 MG/3ML SOPN Inject 1 mg into the skin once a week. 07/22/23   [provider]  SEMGLEE, YFGN, 100 UNIT/ML Pen Inject 22 Units into the skin at bedtime. 05/22/21   [provider]  topiramate (TOPAMAX) 100 MG tablet Take 100 mg by mouth 2 (two) times daily.    [provider]  triamterene-hydrochlorothiazide (MAXZIDE-25) 37.5-25 MG per tablet Take 1 tablet by mouth daily.    [provider]  verapamil (CALAN-SR) 180 MG CR tablet Take 180 mg by mouth daily. 11/18/21   [provider]    Labs on Admission:   CBC: Recent Labs  Lab 08/05/23 0542 08/07/23 0906  WBC 7.7 6.9  NEUTROABS 3.8 3.6  HGB 12.3 12.2  HCT 37.9 39.0  MCV 81.5 82.3  PLT 287 281    Basic Metabolic Panel: Recent Labs  Lab 08/05/23 0542 08/05/23 1317 08/07/23 0906  NA 141  --  141  K 3.1*  --  3.5  CL 105  --  109  CO2 29  --  26  GLUCOSE 101*  --  101*  BUN 18  --  12  CREATININE 0.88  --  0.83  CALCIUM 9.0  --  9.0  MG  --  2.1  --     Liver Function Tests: Recent Labs  Lab 08/05/23 0542  AST 14*  ALT 14  ALKPHOS 64  BILITOT 0.3  PROT 6.1*  ALBUMIN 3.5   No results for input(s): "LIPASE", "AMYLASE" in the last 168 hours. No results for input(s): "AMMONIA" in the last 168 hours.  Cardiac Enzymes: No results for input(s): "CKTOTAL", "CKMB", "CKMBINDEX", "TROPONINI" in the last 168 hours.  BNP (last 3 results) No results for input(s): "BNP" in  the last 8760 hours.  ProBNP (last 3 results) No results for input(s): "PROBNP" in the last 8760 hours.  CBG: Recent Labs  Lab 08/05/23 1630 08/05/23 2036 08/06/23 0753 08/06/23 1123 08/06/23 1605  GLUCAP 131* 126* 113* 128* 156*    Lipase     Component Value Date/Time   LIPASE 30 02/28/2022 0153   LIPASE 160 09/07/2013 1644     Urinalysis    Component Value Date/Time   COLORURINE YELLOW (A) 07/24/2023 0050   APPEARANCEUR HAZY (A) 07/24/2023 0050   APPEARANCEUR Hazy 11/13/2013 2015   LABSPEC 1.016 07/24/2023 0050   LABSPEC 1.025 11/13/2013 2015   PHURINE 6.0  07/24/2023 0050   GLUCOSEU NEGATIVE 07/24/2023 0050   GLUCOSEU Negative 11/13/2013 2015   HGBUR NEGATIVE 07/24/2023 0050   BILIRUBINUR NEGATIVE 07/24/2023 0050   BILIRUBINUR Negative 11/13/2013 2015   KETONESUR NEGATIVE 07/24/2023 0050   PROTEINUR NEGATIVE 07/24/2023 0050   NITRITE NEGATIVE 07/24/2023 0050   LEUKOCYTESUR NEGATIVE 07/24/2023 0050   LEUKOCYTESUR Negative 11/13/2013 2015     Drugs of Abuse  No results found for: "LABOPIA", "COCAINSCRNUR", "LABBENZ", "AMPHETMU", "THCU", "LABBARB"    Radiological Exams on Admission: ECHOCARDIOGRAM COMPLETE Result Date: 08/06/2023    ECHOCARDIOGRAM REPORT   Patient Name:   Baudelia ANN Eichler Date of Exam: 08/06/2023 Medical Rec #:  [id number]        Height:       66.0 in Accession #:    [id number]       Weight:       251.1 lb Date of Birth:  12/05/1965       BSA:          2.203 m Patient Age:    57 years         BP:           138/81 mmHg Patient Gender: F                HR:           69 bpm. Exam Location:  [facility] Procedure: 2D Echo, Cardiac Doppler, Color Doppler and Saline Contrast Bubble            Study (Both Spectral and Color Flow Doppler were utilized during            procedure). Indications:     TIA G45.9  History:         Patient has no prior history of Echocardiogram examinations.                  Risk Factors:Diabetes and Hypertension. Migraines.  Sonographer:      Broadus Canes Referring Phys:  [id number] [place] Diagnosing Phys: Lida Reeks Alluri IMPRESSIONS  1. Left ventricular ejection fraction, by estimation, is 60 to 65%. The left ventricle has normal function. The left ventricle has no regional wall motion abnormalities. Left ventricular diastolic parameters were normal.  2. Right ventricular systolic function is normal. The right ventricular size is normal.  3. The mitral valve is normal in structure. No evidence of mitral valve regurgitation.  4. The aortic valve was not well visualized. Aortic valve regurgitation is not visualized.  5. Agitated saline contrast bubble study was negative, with no evidence of any interatrial shunt. FINDINGS  Left Ventricle: Left ventricular ejection fraction, by estimation, is 60 to 65%. The left ventricle has normal function. The left ventricle has no regional wall motion abnormalities. The left ventricular internal cavity size was normal in size. There is  no left ventricular hypertrophy. Left ventricular diastolic parameters were normal. Right Ventricle: The right ventricular size is normal. No increase in right ventricular wall thickness. Right ventricular systolic function is normal. Left Atrium: Left atrial size was normal in size. Right Atrium: Right atrial size was normal in size. Pericardium: There is no evidence of pericardial effusion. Mitral Valve: The mitral valve is normal in structure. No evidence of mitral valve regurgitation. MV peak gradient, 3.5 mmHg. The mean mitral valve gradient is 2.0 mmHg. Tricuspid Valve: The tricuspid valve is normal in structure. Tricuspid valve regurgitation is not demonstrated. Aortic Valve: The aortic valve was not well visualized. Aortic valve regurgitation is not visualized.  Aortic valve mean gradient measures 3.0 mmHg. Aortic valve peak gradient measures 5.8 mmHg. Aortic valve area, by VTI measures 2.61 cm. Pulmonic Valve: The pulmonic valve was not well visualized. Pulmonic valve  regurgitation is trivial. Aorta: The aortic root and ascending aorta are structurally normal, with no evidence of dilitation. IAS/Shunts: The atrial septum is grossly normal. Agitated saline contrast was given intravenously to evaluate for intracardiac shunting. Agitated saline contrast bubble study was negative, with no evidence of any interatrial shunt.  LEFT VENTRICLE PLAX 2D LVIDd:         4.30 cm   Diastology LVIDs:         2.70 cm   LV e' medial:    8.49 cm/s LV PW:         0.90 cm   LV E/e' medial:  8.7 LV IVS:        1.00 cm   LV e' lateral:   12.40 cm/s LVOT diam:     2.00 cm   LV E/e' lateral: 6.0 LV SV:         63 LV SV Index:   29 LVOT Area:     3.14 cm  RIGHT VENTRICLE RV Basal diam:  2.90 cm RV Mid diam:    2.70 cm LEFT ATRIUM             Index        RIGHT ATRIUM           Index LA diam:        3.80 cm 1.73 cm/m   RA Area:     11.00 cm LA Vol (A2C):   30.4 ml 13.80 ml/m  RA Volume:   21.70 ml  9.85 ml/m LA Vol (A4C):   44.4 ml 20.16 ml/m LA Biplane Vol: 38.9 ml 17.66 ml/m  AORTIC VALVE AV Area (Vmax):    2.49 cm AV Area (Vmean):   2.50 cm AV Area (VTI):     2.61 cm AV Vmax:           120.00 cm/s AV Vmean:          82.600 cm/s AV VTI:            0.240 m AV Peak Grad:      5.8 mmHg AV Mean Grad:      3.0 mmHg LVOT Vmax:         95.00 cm/s LVOT Vmean:        65.700 cm/s LVOT VTI:          0.200 m LVOT/AV VTI ratio: 0.83  AORTA Ao Root diam: 3.10 cm MITRAL VALVE               TRICUSPID VALVE MV Area (PHT): 3.33 cm    TR Peak grad:   14.3 mmHg MV Area VTI:   2.61 cm    TR Vmax:        189.00 cm/s MV Peak grad:  3.5 mmHg MV Mean grad:  2.0 mmHg    SHUNTS MV Vmax:       0.93 m/s    Systemic VTI:  0.20 m MV Vmean:      64.0 cm/s   Systemic Diam: 2.00 cm MV Decel Time: 228 msec MV E velocity: 73.90 cm/s MV A velocity: 95.90 cm/s MV E/A ratio:  0.77 Joetta Mustache Electronically signed by Joetta Mustache Signature Date/Time: 08/06/2023/4:43:41 PM    Final      Signed, Hoyt Macleod, MD [facility] 08/07/2023

## 2023-08-08 DIAGNOSIS — E876 Hypokalemia: Secondary | ICD-10-CM | POA: Diagnosis not present

## 2023-08-08 DIAGNOSIS — G43809 Other migraine, not intractable, without status migrainosus: Secondary | ICD-10-CM

## 2023-08-08 DIAGNOSIS — E785 Hyperlipidemia, unspecified: Secondary | ICD-10-CM

## 2023-08-08 DIAGNOSIS — E119 Type 2 diabetes mellitus without complications: Secondary | ICD-10-CM

## 2023-08-08 DIAGNOSIS — H6691 Otitis media, unspecified, right ear: Secondary | ICD-10-CM

## 2023-08-08 DIAGNOSIS — Z794 Long term (current) use of insulin: Secondary | ICD-10-CM

## 2023-08-08 DIAGNOSIS — H6501 Acute serous otitis media, right ear: Secondary | ICD-10-CM

## 2023-08-08 DIAGNOSIS — R519 Headache, unspecified: Secondary | ICD-10-CM

## 2023-08-08 DIAGNOSIS — I10 Essential (primary) hypertension: Secondary | ICD-10-CM

## 2023-08-08 DIAGNOSIS — R42 Dizziness and giddiness: Secondary | ICD-10-CM | POA: Diagnosis not present

## 2023-08-08 DIAGNOSIS — R531 Weakness: Secondary | ICD-10-CM

## 2023-08-08 LAB — BASIC METABOLIC PANEL WITH GFR
Anion gap: 10 (ref 5–15)
BUN: 13 mg/dL (ref 6–20)
CO2: 20 mmol/L — ABNORMAL LOW (ref 22–32)
Calcium: 8.7 mg/dL — ABNORMAL LOW (ref 8.9–10.3)
Chloride: 110 mmol/L (ref 98–111)
Creatinine, Ser: 1 mg/dL (ref 0.44–1.00)
GFR, Estimated: >60 mL/min (ref >60)
Glucose, Bld: 190 mg/dL — ABNORMAL HIGH (ref 70–99)
Potassium: 3.1 mmol/L — ABNORMAL LOW (ref 3.5–5.1)
Sodium: 140 mmol/L (ref 135–145)

## 2023-08-08 LAB — CBC
HCT: 36.7 % (ref 36.0–46.0)
Hemoglobin: 11.7 g/dL — ABNORMAL LOW (ref 12.0–15.0)
MCH: 26 pg (ref 26.0–34.0)
MCHC: 31.9 g/dL (ref 30.0–36.0)
MCV: 81.6 fL (ref 80.0–100.0)
Platelets: 274 10*3/uL (ref 150–400)
RBC: 4.5 MIL/uL (ref 3.87–5.11)
RDW: 13.3 % (ref 11.5–15.5)
WBC: 6.5 10*3/uL (ref 4.0–10.5)
nRBC: 0 % (ref 0.0–0.2)

## 2023-08-08 LAB — URINALYSIS, ROUTINE W REFLEX MICROSCOPIC
Bilirubin Urine: NEGATIVE
Glucose, UA: NEGATIVE mg/dL
Hgb urine dipstick: NEGATIVE
Ketones, ur: NEGATIVE mg/dL
Leukocytes,Ua: NEGATIVE
Nitrite: NEGATIVE
Protein, ur: NEGATIVE mg/dL
Specific Gravity, Urine: 1.006 (ref 1.005–1.030)
pH: 7 (ref 5.0–8.0)

## 2023-08-08 LAB — GLUCOSE, CAPILLARY
Glucose-Capillary: 166 mg/dL — ABNORMAL HIGH (ref 70–99)
Glucose-Capillary: 135 mg/dL — ABNORMAL HIGH (ref 70–99)
Glucose-Capillary: 110 mg/dL — ABNORMAL HIGH (ref 70–99)
Glucose-Capillary: 128 mg/dL — ABNORMAL HIGH (ref 70–99)

## 2023-08-08 MED ORDER — POTASSIUM CHLORIDE CRYS ER 20 MEQ PO TBCR
40.0000 meq | EXTENDED_RELEASE_TABLET | Freq: Once | ORAL | Status: AC
  Administered 2023-08-08: 40 meq via ORAL
  Filled 2023-08-08: qty 2

## 2023-08-08 MED ORDER — SODIUM CHLORIDE 0.9 % IV SOLN: INTRAVENOUS | Status: DC

## 2023-08-08 MED ORDER — AMOXICILLIN-POT CLAVULANATE 875-125 MG PO TABS
1.0000 | ORAL_TABLET | Freq: Two times a day (BID) | ORAL | Status: DC
  Administered 2023-08-08 – 2023-08-10 (×5): 1 via ORAL
  Filled 2023-08-08 (×5): qty 1

## 2023-08-08 MED ORDER — INSULIN GLARGINE-YFGN 100 UNIT/ML ~~LOC~~ SOLN
10.0000 [IU] | Freq: Every day | SUBCUTANEOUS | Status: DC
  Administered 2023-08-08 – 2023-08-09 (×2): 10 [IU] via SUBCUTANEOUS
  Filled 2023-08-08 (×3): qty 0.1

## 2023-08-08 NOTE — Assessment & Plan Note (Signed)
 -  Continue Lipitor

## 2023-08-08 NOTE — Evaluation (Signed)
 Physical Therapy Evaluation Patient Details Name: Lenaya Pietsch MRN: [id number] DOB: [birth year] Today's Date: 08/08/2023  History of Present Illness  Pt is a 58 y/o female presented to ED due to continued ongoing dizziness. Recent presentation for TIA, 6/3. PMH: IDDM, HTN, migraines, headache, chronic pain.  Clinical Impression  Pt A&Ox4, reported ongoing dizziness at rest, and with all mobility today. At baseline she is independent, works, drives. Pt was supervision - CGA for all mobility today, including toileting (modI). Occasional CGA at pt request due to dizziness. Pt negative for orthostatic hypotension, BPPV testing appeared negative (pt reported symptoms with both L and R [place], no nystagmus noted, symptoms lasting >2 minutes each side.) Pt did report a history of migraines, and prior bouts of dizziness (meclizine  at home) that normally did not last this long. Pt with current headache, RN aware. The patient would benefit from further assessment and vestibular therapy interventions at discharge.       If plan is discharge home, recommend the following: Assistance with cooking/housework;Assist for transportation;Help with stairs or ramp for entrance   Can travel by private vehicle        Equipment Recommendations None recommended by PT  Recommendations for Other Services       Functional Status Assessment Patient has had a recent decline in their functional status and demonstrates the ability to make significant improvements in function in a reasonable and predictable amount of time.     Precautions / Restrictions Precautions Precautions: Fall Recall of Precautions/Restrictions: Intact Restrictions Weight Bearing Restrictions Per Provider Order: No      Mobility  Bed Mobility Overal bed mobility: Modified Independent                  Transfers Overall transfer level: Needs assistance Equipment used: None, 1 person hand held assist Transfers: Sit to/from  Stand Sit to Stand: Supervision, Contact guard assist           General transfer comment: slow, cautious    Ambulation/Gait Ambulation/Gait assistance: Supervision, Contact guard assist Gait Distance (Feet): 120 Feet Assistive device: None   Gait velocity: decreased     General Gait Details: CGA similar to a cane at end of ambulation but pt refusing SPC use  Stairs            Wheelchair Mobility     Tilt Bed    Modified Rankin (Stroke Patients Only)       Balance Overall balance assessment: Needs assistance Sitting-balance support: Feet supported Sitting balance-Leahy Scale: Good       Standing balance-Leahy Scale: Good                               Pertinent Vitals/Pain Pain Assessment Pain Assessment: No/denies pain    Home Living Family/patient expects to be discharged to:: Private residence Living Arrangements: Children;Spouse/significant other Available Help at Discharge: Family Type of Home: House Home Access: Stairs to enter Entrance Stairs-Rails: Left;Right;Can reach both Secretary/administrator of Steps: 3   Home Layout: One level Home Equipment: None      Prior Function Prior Level of Function : Independent/Modified Independent;Working/employed;Driving                     Extremity/Trunk Assessment   Upper Extremity Assessment Upper Extremity Assessment: Overall WFL for tasks assessed    Lower Extremity Assessment Lower Extremity Assessment:  (able to move BLE against gravity)  Communication        Cognition Arousal: Alert Behavior During Therapy: WFL for tasks assessed/performed   PT - Cognitive impairments: No apparent impairments                         Following commands: Intact       Cueing       General Comments      Exercises     Assessment/Plan    PT Assessment Patient needs continued PT services  PT Problem List Decreased strength;Decreased activity  tolerance;Decreased balance;Decreased mobility;Decreased safety awareness;Decreased coordination;Impaired sensation       PT Treatment Interventions DME instruction;Gait training;Stair training;Therapeutic activities;Functional mobility training;Therapeutic exercise;Balance training    PT Goals (Current goals can be found in the Care Plan section)  Acute Rehab PT Goals Patient Stated Goal: to go home Time For Goal Achievement: 08/22/23 Potential to Achieve Goals: Good    Frequency Min 2X/week     Co-evaluation               AM-PAC PT "6 Clicks" Mobility  Outcome Measure Help needed turning from your back to your side while in a flat bed without using bedrails?: None Help needed moving from lying on your back to sitting on the side of a flat bed without using bedrails?: None Help needed moving to and from a bed to a chair (including a wheelchair)?: A Little Help needed standing up from a chair using your arms (e.g., wheelchair or bedside chair)?: A Little Help needed to walk in hospital room?: A Little Help needed climbing 3-5 steps with a railing? : A Little 6 Click Score: 20    End of Session   Activity Tolerance: Patient tolerated treatment well Patient left: in bed;with call bell/phone within reach;with nursing/sitter in room Nurse Communication: Mobility status PT Visit Diagnosis: Unsteadiness on feet (R26.81);Muscle weakness (generalized) (M62.81)    Time: 5284-1324 PT Time Calculation (min) (ACUTE ONLY): 24 min   Charges:   PT Evaluation $PT Eval Low Complexity: 1 Low PT Treatments $Therapeutic Activity: 23-37 mins PT General Charges $$ ACUTE PT VISIT: 1 Visit         Darien Eden PT, DPT 11:38 AM,08/08/23

## 2023-08-08 NOTE — Plan of Care (Signed)
 Patient noted to be free from any recent falls in room, during current hospitalization.  Patient noted to ambulate in room,, noted to require standby assist, as visualized with patient holding onto wall/furniture in room for stability.  Patient reported earlier in shift of headache, receiving prn medication to assist with adequate pain management.  Patient able to rest without additional medical interventions.  Patient to continue to be monitored by hospital staff until discharge.

## 2023-08-08 NOTE — Assessment & Plan Note (Signed)
 Recent hospitalization for left-sided weakness and numbness.  MRI of the brain negative.  Patient did not have a stroke or TIA.

## 2023-08-08 NOTE — Assessment & Plan Note (Addendum)
 Patient on Topamax , verapamil  SR and Elavil  to prevent headache.  Patient received numerous doses of medications on 6/6 to try to help out with headache including magnesium , Phenergan , Benadryl , Depakote, Imitrex  and Toradol .  Today patient still has a little headache but only slight and wants to go home.

## 2023-08-08 NOTE — Hospital Course (Addendum)
 58 y.o. female with PMH significant for DM2, hypertension, HLD, migraine, was recently overnight in the hospital 6/2 to 6/3 for TIA symptoms.  She had left-sided, aphasia which subsided while in the ED.  Was seen by neurology.  CT head, CTA head and neck and MRI brain were unremarkable.  She had dizziness and hence started and discharged on meclizine  as needed. Patient returned back to ED today within 24 hours of discharge with complaint of persistent dizziness, unable to keep her balance, headache.   Afebrile, hemodynamically stable CBC, BMP unremarkable, troponin not elevated. EDP did curbside discussion with neurologist Dr. Cleone Dad who did not see the need of repeat imaging as they were all done in the last 48 hours only. Patient was given IV Reglan , IV Benadryl , IV Compazine  and meclizine  despite which she continued to have dizziness and hence decision was made to observe overnight..  6/5.  Patient states that she gets vertigo couple times a year but only lasts a short period of time.  This time lasting longer.  Patient states that the room is spinning counterclockwise for her.  Patient not having any nystagmus.  Movement of Augmentin  for otitis media.  And IV fluids.  Continue standing dose meclizine  for now. 6/6.  Today patient had a headache.  Patient was given IV magnesium , Phenergan , Fioricet , Benadryl  and now Depakote.  Patient on Topamax  and verapamil  to prevent headache.  Also tried Imitrex  and Toradol . 6/7.  Patient states she still has a headache but she feels well enough to go home.

## 2023-08-08 NOTE — Assessment & Plan Note (Signed)
Finish course of Augmentin.

## 2023-08-08 NOTE — Assessment & Plan Note (Addendum)
 Will continue lower dose Semglee  insulin  10 units at night and can go back on semaglutide.

## 2023-08-08 NOTE — Assessment & Plan Note (Addendum)
 Replaced. Hold Maxide.

## 2023-08-08 NOTE — Assessment & Plan Note (Signed)
 On verapamil SR.

## 2023-08-08 NOTE — Assessment & Plan Note (Addendum)
 Standing dose meclizine  while in hospital and then switched to as needed. Augmentin  for right otitis media.

## 2023-08-08 NOTE — Progress Notes (Signed)
 Progress Note   Patient: Michele [name] YNW:[id number] DOB: 1965/12/25 DOA: 08/07/2023     0 DOS: the patient was seen and examined on 08/08/2023   Brief hospital course: 58 y.o. female with PMH significant for DM2, hypertension, HLD, migraine, was recently overnight in the hospital 6/2 to 6/3 for TIA symptoms.  She had left-sided, aphasia which subsided while in the ED.  Was seen by neurology.  CT head, CTA head and neck and MRI brain were unremarkable.  She had dizziness and hence started and discharged on meclizine  as needed. Patient returned back to ED today within 24 hours of discharge with complaint of persistent dizziness, unable to keep her balance, headache.   Afebrile, hemodynamically stable CBC, BMP unremarkable, troponin not elevated. EDP did curbside discussion with neurologist Dr. Cleone Dad who did not see the need of repeat imaging as they were all done in the last 48 hours only. Patient was given IV Reglan , IV Benadryl , IV Compazine  and meclizine  despite which she continued to have dizziness and hence decision was made to observe overnight..  6/5.  Patient states that she gets vertigo couple times a year but only lasts a short period of time.  This time lasting longer.  Patient states that the room is spinning counterclockwise for her.  Patient not having any nystagmus.  Movement of Augmentin  for otitis media.  And IV fluids.  Continue standing dose meclizine  for now.  Assessment and Plan: * Vertigo Standing dose meclizine .  Add IV fluids.  Add Augmentin  for right otitis media.  Type 2 diabetes mellitus without complication, with long-term current use of insulin  (HCC) Sliding scale insulin  plus low-dose Semglee insulin  at night.  Hypokalemia Replace potassium.  Hold Maxide.  Hyperlipidemia, unspecified Continue Lipitor  Essential hypertension On verapamil SR.  Headache Continue Topamax  Left-sided weakness Recent hospitalization for left-sided weakness and numbness.   MRI of the brain negative.  Patient did not have a stroke or TIA.  Otitis media, right Add Augmentin .        Subjective: Patient symptomatic with vertigo just with turning over so I can listen to her lungs.  Patient worked with physical therapy and was symptomatic with moving around.  Complains of room spinning counterclockwise.  Physical Exam: Vitals:   08/07/23 2145 08/07/23 2147 08/08/23 0411 08/08/23 0846  BP: (!) 148/99 (!) 150/99 137/74 (!) 141/82  Pulse: 77 87 73 77  Resp: 18 18 18 17   Temp: 98.2 F (36.8 C) 98.2 F (36.8 C) 98.1 F (36.7 C) 98.4 F (36.9 C)  TempSrc:      SpO2: 100% 100% 100% 100%  Weight:      Height:       Physical Exam HENT:     Head: Normocephalic.     Mouth/Throat:     Pharynx: No oropharyngeal exudate.  Eyes:     General: Lids are normal.     Extraocular Movements: Extraocular movements intact.     Right eye: No nystagmus.     Left eye: No nystagmus.     Conjunctiva/sclera: Conjunctivae normal.  Cardiovascular:     Rate and Rhythm: Normal rate and regular rhythm.     Heart sounds: Normal heart sounds, S1 normal and S2 normal.  Pulmonary:     Breath sounds: No decreased breath sounds, wheezing, rhonchi or rales.  Abdominal:     Palpations: Abdomen is soft.     Tenderness: There is no abdominal tenderness.  Musculoskeletal:     Right lower leg: No swelling.  Left lower leg: No swelling.  Skin:    General: Skin is warm.     Findings: No rash.  Neurological:     Mental Status: She is alert and oriented to person, place, and time.     Data Reviewed: Potassium 3.1, creatinine 1.0, hemoglobin 11.7 Reviewed recent imaging of the brain  Family Communication: Family at bedside  Disposition: Status is: Observation Observe overnight with IV fluids standing dose meclizine  and will start Augmentin .  Planned Discharge Destination: Home    Time spent: 28 minutes  Author: Verla Glaze, MD 08/08/2023 11:32 AM  For on call  review [email or web address].

## 2023-08-08 NOTE — TOC Initial Note (Addendum)
 Transition of Care [facility]) - Initial/Assessment Note    Patient Details  Name: Michele [name] MRN: [id number] Date of Birth: 1965/12/02  [facility]) CM/SW Contact:    Crayton Docker, RN 08/08/2023, 11:35 AM  Clinical Narrative:                  CM to patient's room regarding TOC screening assessment.CM introduced case management role and discharge care planning process. Patient verbalized understanding and agreement with screening interview. Patient lives with husband, Dominick Fries. Per patient has a rolling walker. Per patient, patient's husband, Dominick Fries will transport home. CM and patient discussed Vestibular Rehab referral. CM faxed referral to [facility], fax: [phone]. CM call to [facility], phone: [phone] regarding confirmation of receipt of fax. No answer, CM left message for return call. CM received Ok in fax transmission.  Expected Discharge Plan: OP Rehab Barriers to Discharge: Continued Medical Work up   Patient Goals and CMS Choice    OP Rehab, Vestibular Therapy   Expected Discharge Plan and Services    OP Rehab, Vestibular Therapy   Living arrangements for the past 2 months: Single Family Home     Prior Living Arrangements/Services Living arrangements for the past 2 months: Single Family Home   Patient language and need for interpreter reviewed:: No        Need for Family Participation in Patient Care: Yes (Comment)   Current home services: DME (Rolling walker) Criminal Activity/Legal Involvement Pertinent to Current Situation/Hospitalization: No - Comment as needed  Activities of Daily Living   ADL Screening (condition at time of admission) Independently performs ADLs?: Yes (appropriate for developmental age) Is the patient deaf or have difficulty hearing?: No Does the patient have difficulty seeing, even when wearing glasses/contacts?: No Does the patient have difficulty concentrating, remembering, or making  decisions?: No  Permission Sought/Granted      Share Information with NAME: Darlyne El     Permission granted to share info w Relationship: Daughter/Mother  Permission granted to share info w Contact Information: yes  Emotional Assessment Appearance:: Appears stated age Attitude/Demeanor/Rapport: Engaged Affect (typically observed): Calm Orientation: : Oriented to Self, Oriented to Place, Oriented to  Time, Oriented to Situation      Admission diagnosis:  Vertigo [R42] Other migraine without status migrainosus, not intractable [G43.809] Patient Active Problem List   Diagnosis Date Noted   Otitis media, right 08/08/2023   Left-sided weakness 08/08/2023   Headache 08/08/2023   Essential hypertension 08/08/2023   Hyperlipidemia, unspecified 08/08/2023   Hypokalemia 08/08/2023   Type 2 diabetes mellitus without complication, with long-term current use of insulin  (HCC) 08/08/2023   Vertigo 08/07/2023   TIA (transient ischemic attack) 08/05/2023   Paresthesia of left arm and leg 08/05/2023   Chronic painful diabetic neuropathy (HCC) 10/26/2021   Chronic radicular lumbar pain 10/26/2021   Lumbar facet joint syndrome 03/21/2018   Chronic right-sided low back pain with right-sided sciatica 03/21/2018   Chronic pain syndrome 03/21/2018   Obesity, morbid, BMI 40.0-49.9 (HCC) 07/12/2017   Bilateral occipital neuralgia 09/14/2016   Neck pain 12/16/2014   Migraines 04/28/2014   PCP:  Little Riff, MD Pharmacy:   [facility] [address]), [place] - [address] [place] ([place]) [place] [address] Phone: [phone] Fax: [phone]  [facility] [address], [place] - [address] [place] [address] Phone: [phone] Fax: [phone]  [id number] [place], [place] - [phone]  [address] [place] [address] Phone: [phone] Fax: [phone]  [facility]  [address] [place] - [address] [place] [address] Phone: [phone] Fax: [phone]     Social Drivers of Health (SDOH) Social History: SDOH Screenings   Food Insecurity: Food Insecurity Present (08/07/2023)  Housing: Low Risk  (08/07/2023)  Transportation Needs: No Transportation Needs (08/07/2023)  Utilities: Not At Risk (08/07/2023)  Depression (PHQ2-9): Low Risk  (10/26/2021)  Financial Resource Strain: High Risk (07/26/2023)   Received from [facility]  Physical Activity: Sufficiently Active (11/29/2022)   Received from [facility]  Social Connections: Unknown (08/07/2023)  Stress: Stress Concern Present (11/29/2022)   Received from [facility]  Tobacco Use: High Risk (08/07/2023)  Health Literacy: Adequate Health Literacy (11/29/2022)   [facility] from [facility]   SDOH Interventions:     Readmission Risk Interventions     No data to display

## 2023-08-09 DIAGNOSIS — G4489 Other headache syndrome: Secondary | ICD-10-CM | POA: Diagnosis not present

## 2023-08-09 DIAGNOSIS — R42 Dizziness and giddiness: Secondary | ICD-10-CM | POA: Diagnosis not present

## 2023-08-09 DIAGNOSIS — E119 Type 2 diabetes mellitus without complications: Secondary | ICD-10-CM | POA: Diagnosis not present

## 2023-08-09 DIAGNOSIS — E876 Hypokalemia: Secondary | ICD-10-CM | POA: Diagnosis not present

## 2023-08-09 DIAGNOSIS — E669 Obesity, unspecified: Secondary | ICD-10-CM

## 2023-08-09 LAB — GLUCOSE, CAPILLARY
Glucose-Capillary: 107 mg/dL — ABNORMAL HIGH (ref 70–99)
Glucose-Capillary: 96 mg/dL (ref 70–99)
Glucose-Capillary: 135 mg/dL — ABNORMAL HIGH (ref 70–99)
Glucose-Capillary: 140 mg/dL — ABNORMAL HIGH (ref 70–99)

## 2023-08-09 MED ORDER — DIPHENHYDRAMINE HCL 50 MG/ML IJ SOLN
12.5000 mg | Freq: Once | INTRAMUSCULAR | Status: AC
  Administered 2023-08-09: 12.5 mg via INTRAVENOUS
  Filled 2023-08-09: qty 1

## 2023-08-09 MED ORDER — BISACODYL 10 MG RE SUPP
10.0000 mg | Freq: Every day | RECTAL | Status: DC | PRN
  Administered 2023-08-09: 10 mg via RECTAL
  Filled 2023-08-09: qty 1

## 2023-08-09 MED ORDER — MAGNESIUM SULFATE 2 GM/50ML IV SOLN
2.0000 g | Freq: Once | INTRAVENOUS | Status: AC
  Administered 2023-08-09: 2 g via INTRAVENOUS
  Filled 2023-08-09: qty 50

## 2023-08-09 MED ORDER — OXYCODONE HCL 5 MG PO TABS: 5.0000 mg | ORAL_TABLET | Freq: Four times a day (QID) | ORAL | Status: DC | PRN

## 2023-08-09 MED ORDER — VALPROATE SODIUM 100 MG/ML IV SOLN
500.0000 mg | Freq: Once | INTRAVENOUS | Status: AC
  Administered 2023-08-09: 500 mg via INTRAVENOUS
  Filled 2023-08-09: qty 5

## 2023-08-09 MED ORDER — SUMATRIPTAN SUCCINATE 50 MG PO TABS
100.0000 mg | ORAL_TABLET | Freq: Once | ORAL | Status: AC
  Administered 2023-08-09: 100 mg via ORAL
  Filled 2023-08-09: qty 2

## 2023-08-09 MED ORDER — KETOROLAC TROMETHAMINE 30 MG/ML IJ SOLN
30.0000 mg | Freq: Once | INTRAMUSCULAR | Status: AC
  Administered 2023-08-09: 30 mg via INTRAVENOUS
  Filled 2023-08-09: qty 1

## 2023-08-09 MED ORDER — SODIUM CHLORIDE 0.9 % IV SOLN
12.5000 mg | Freq: Once | INTRAVENOUS | Status: AC
  Administered 2023-08-09: 12.5 mg via INTRAVENOUS
  Filled 2023-08-09: qty 12.5

## 2023-08-09 MED ORDER — AMITRIPTYLINE HCL 25 MG PO TABS
50.0000 mg | ORAL_TABLET | Freq: Every day | ORAL | Status: DC
  Administered 2023-08-09: 50 mg via ORAL
  Filled 2023-08-09: qty 2

## 2023-08-09 NOTE — Progress Notes (Signed)
 Progress Note   Patient: Michele [name] MWU:[id number] DOB: [birth year] DOA: 08/07/2023     0 DOS: the patient was seen and examined on 08/09/2023   Brief hospital course: 58 y.o. female with PMH significant for DM2, hypertension, HLD, migraine, was recently overnight in the hospital 6/2 to 6/3 for TIA symptoms.  She had left-sided, aphasia which subsided while in the ED.  Was seen by neurology.  CT head, CTA head and neck and MRI brain were unremarkable.  She had dizziness and hence started and discharged on meclizine  as needed. Patient returned back to ED today within 24 hours of discharge with complaint of persistent dizziness, unable to keep her balance, headache.   Afebrile, hemodynamically stable CBC, BMP unremarkable, troponin not elevated. EDP did curbside discussion with neurologist Dr. Cleone Dad who did not see the need of repeat imaging as they were all done in the last 48 hours only. Patient was given IV Reglan , IV Benadryl , IV Compazine  and meclizine  despite which she continued to have dizziness and hence decision was made to observe overnight..  6/5.  Patient states that she gets vertigo couple times a year but only lasts a short period of time.  This time lasting longer.  Patient states that the room is spinning counterclockwise for her.  Patient not having any nystagmus.  Movement of Augmentin  for otitis media.  And IV fluids.  Continue standing dose meclizine  for now. 6/6.  Today patient had a headache.  Patient was given IV magnesium, Phenergan, Fioricet, Benadryl  and now Depakote.  Patient on Topamax and verapamil to prevent headache.  Assessment and Plan: * Headache IV magnesium, IV Phenergan, IV Benadryl , oral Fioricet and now IV Depakote being given.  Patient on Topamax,, and Elavil  to prevent headache.  Vertigo Standing dose meclizine . Augmentin  for right otitis media.  Otitis media, right Finish course of Augmentin .  Essential hypertension On verapamil  SR.  Left-sided weakness Recent hospitalization for left-sided weakness and numbness.  MRI of the brain negative.  Patient did not have a stroke or TIA.  Hyperlipidemia, unspecified Continue Lipitor  Hypokalemia Replaced. Hold Maxide.  Type 2 diabetes mellitus without complication, with long-term current use of insulin  (HCC) Sliding scale insulin  plus low-dose Semglee insulin  at night.  Obesity (BMI 30-39.9) BMI 38.89        Subjective: Patient complaining of headache despite numerous rounds of medication.  Initially admitted with vertigo.  Physical Exam: Vitals:   08/08/23 1747 08/09/23 0403 08/09/23 0820 08/09/23 1627  BP: 133/84 121/82 (!) 161/99 (!) 146/87  Pulse: 77 66 72 72  Resp: 18 18 16 18   Temp: 99.1 F (37.3 C) 99 F (37.2 C) 97.9 F (36.6 C) 98.1 F (36.7 C)  TempSrc:  Oral    SpO2: 100% 100% 100% 100%  Weight:      Height:       Physical Exam HENT:     Head: Normocephalic.     Mouth/Throat:     Pharynx: No oropharyngeal exudate.  Eyes:     General: Lids are normal.     Extraocular Movements: Extraocular movements intact.     Right eye: No nystagmus.     Left eye: No nystagmus.     Conjunctiva/sclera: Conjunctivae normal.     Comments: Photosensitive today.  Cardiovascular:     Rate and Rhythm: Normal rate and regular rhythm.     Heart sounds: Normal heart sounds, S1 normal and S2 normal.  Pulmonary:     Breath sounds: No decreased breath  sounds, wheezing, rhonchi or rales.  Abdominal:     Palpations: Abdomen is soft.     Tenderness: There is no abdominal tenderness.  Musculoskeletal:     Right lower leg: No swelling.     Left lower leg: No swelling.  Skin:    General: Skin is warm.     Findings: No rash.  Neurological:     Mental Status: She is alert and oriented to person, place, and time.     Data Reviewed: Last creatinine 1 last hemoglobin 11.7  Family Communication: Family at bedside  Disposition: Status is:  Observation Today with headache giving numerous rounds of medication including magnesium, Phenergan, Benadryl , Fioricet and now IV Depakote.  Planned Discharge Destination: Home    Time spent: 40 minutes  Author: Verla Glaze, MD 08/09/2023 5:18 PM  For on call review [email or web address].

## 2023-08-09 NOTE — Plan of Care (Signed)
  Problem: Metabolic: Goal: Ability to maintain appropriate glucose levels will improve Outcome: Progressing   Problem: Nutritional: Goal: Maintenance of adequate nutrition will improve Outcome: Progressing   Problem: Nutrition: Goal: Adequate nutrition will be maintained Outcome: Progressing   Problem: Skin Integrity: Goal: Risk for impaired skin integrity will decrease Outcome: Progressing

## 2023-08-09 NOTE — Progress Notes (Signed)
 Physical Therapy Treatment Patient Details Name: Michele [name] MRN: [id number] DOB: Jul 29, 1965 Today's Date: 08/09/2023   History of Present Illness Pt is a 58 y/o female presented to ED due to continued ongoing dizziness. Recent presentation for TIA, 6/3. PMH: IDDM, HTN, migraines, headache, chronic pain.    PT Comments  Patient alert, oriented, agreeable to mobility with some encouragement, due to ongoing headache. Pt able to perform bed mobility modI, use bathroom/toilet modI. She ambulated supervision-CGA, did experience one LOB that she corrected with CGA. Use of IV pole for remainder of ambulation but pt adamant about no SPC at discharge. Returned to room with needs in reach. The patient would benefit from further skilled PT intervention to continue to progress towards goals.     If plan is discharge home, recommend the following: Assistance with cooking/housework;Assist for transportation;Help with stairs or ramp for entrance   Can travel by private vehicle        Equipment Recommendations  None recommended by PT    Recommendations for Other Services       Precautions / Restrictions Precautions Precautions: Fall Recall of Precautions/Restrictions: Intact Restrictions Weight Bearing Restrictions Per Provider Order: No     Mobility  Bed Mobility Overal bed mobility: Modified Independent                  Transfers Overall transfer level: Needs assistance Equipment used: None, 1 person hand held assist Transfers: Sit to/from Stand Sit to Stand: Supervision           General transfer comment: slow, cautious    Ambulation/Gait Ambulation/Gait assistance: Supervision, Contact guard assist Gait Distance (Feet): 120 Feet Assistive device: None, 1 person hand held assist Gait Pattern/deviations: Step-through pattern, Decreased stride length Gait velocity: decreased     General Gait Details: 1 LOB with questionable effort, use of BUE support to correct,  CGA   Stairs             Wheelchair Mobility     Tilt Bed    Modified Rankin (Stroke Patients Only)       Balance Overall balance assessment: Needs assistance Sitting-balance support: Feet supported Sitting balance-Leahy Scale: Good       Standing balance-Leahy Scale: Good                              Communication    Cognition Arousal: Alert Behavior During Therapy: WFL for tasks assessed/performed   PT - Cognitive impairments: No apparent impairments                                Cueing    Exercises      General Comments        Pertinent Vitals/Pain Pain Assessment Pain Assessment: Faces Faces Pain Scale: Hurts little more Pain Location: headache Pain Descriptors / Indicators: Headache Pain Intervention(s): Limited activity within patient's tolerance, Monitored during session, Premedicated before session, Repositioned    Home Living                          Prior Function            PT Goals (current goals can now be found in the care plan section) Progress towards PT goals: Progressing toward goals    Frequency    Min 2X/week      PT Plan  Co-evaluation              AM-PAC PT "6 Clicks" Mobility   Outcome Measure  Help needed turning from your back to your side while in a flat bed without using bedrails?: None Help needed moving from lying on your back to sitting on the side of a flat bed without using bedrails?: None Help needed moving to and from a bed to a chair (including a wheelchair)?: A Little Help needed standing up from a chair using your arms (e.g., wheelchair or bedside chair)?: A Little Help needed to walk in hospital room?: A Little Help needed climbing 3-5 steps with a railing? : A Little 6 Click Score: 20    End of Session   Activity Tolerance: Patient tolerated treatment well Patient left: in bed;with call bell/phone within reach Nurse Communication: Mobility  status PT Visit Diagnosis: Unsteadiness on feet (R26.81);Muscle weakness (generalized) (M62.81)     Time: 1120-1130 PT Time Calculation (min) (ACUTE ONLY): 10 min  Charges:    $Therapeutic Activity: 8-22 mins PT General Charges $$ ACUTE PT VISIT: 1 Visit                     Darien Eden PT, DPT 12:29 PM,08/09/23

## 2023-08-09 NOTE — Plan of Care (Signed)

## 2023-08-09 NOTE — Assessment & Plan Note (Signed)
 BMI 38.89

## 2023-08-10 DIAGNOSIS — G4489 Other headache syndrome: Secondary | ICD-10-CM | POA: Diagnosis not present

## 2023-08-10 DIAGNOSIS — I10 Essential (primary) hypertension: Secondary | ICD-10-CM | POA: Diagnosis not present

## 2023-08-10 DIAGNOSIS — H6501 Acute serous otitis media, right ear: Secondary | ICD-10-CM | POA: Diagnosis not present

## 2023-08-10 DIAGNOSIS — E669 Obesity, unspecified: Secondary | ICD-10-CM

## 2023-08-10 DIAGNOSIS — R42 Dizziness and giddiness: Secondary | ICD-10-CM | POA: Diagnosis not present

## 2023-08-10 LAB — BASIC METABOLIC PANEL WITH GFR
Anion gap: 8 (ref 5–15)
BUN: 14 mg/dL (ref 6–20)
CO2: 23 mmol/L (ref 22–32)
Calcium: 8.6 mg/dL — ABNORMAL LOW (ref 8.9–10.3)
Chloride: 109 mmol/L (ref 98–111)
Creatinine, Ser: 0.89 mg/dL (ref 0.44–1.00)
GFR, Estimated: >60 mL/min (ref >60)
Glucose, Bld: 120 mg/dL — ABNORMAL HIGH (ref 70–99)
Potassium: 3.5 mmol/L (ref 3.5–5.1)
Sodium: 140 mmol/L (ref 135–145)

## 2023-08-10 LAB — CBC
HCT: 39.2 % (ref 36.0–46.0)
Hemoglobin: 12.6 g/dL (ref 12.0–15.0)
MCH: 25.8 pg — ABNORMAL LOW (ref 26.0–34.0)
MCHC: 32.1 g/dL (ref 30.0–36.0)
MCV: 80.3 fL (ref 80.0–100.0)
Platelets: 301 10*3/uL (ref 150–400)
RBC: 4.88 MIL/uL (ref 3.87–5.11)
RDW: 13.2 % (ref 11.5–15.5)
WBC: 7 10*3/uL (ref 4.0–10.5)
nRBC: 0 % (ref 0.0–0.2)

## 2023-08-10 LAB — GLUCOSE, CAPILLARY: Glucose-Capillary: 128 mg/dL — ABNORMAL HIGH (ref 70–99)

## 2023-08-10 MED ORDER — AMOXICILLIN-POT CLAVULANATE 875-125 MG PO TABS: 1.0000 | ORAL_TABLET | Freq: Two times a day (BID) | ORAL | 0 refills | Status: AC

## 2023-08-10 MED ORDER — IBUPROFEN 800 MG PO TABS: 800.0000 mg | ORAL_TABLET | Freq: Three times a day (TID) | ORAL | Status: AC | PRN

## 2023-08-10 MED ORDER — SEMGLEE (YFGN) 100 UNIT/ML ~~LOC~~ SOPN: 10.0000 [IU] | PEN_INJECTOR | Freq: Every evening | SUBCUTANEOUS | Status: AC

## 2023-08-10 MED ORDER — POLYETHYLENE GLYCOL 3350 17 G PO PACK: 17.0000 g | PACK | Freq: Every day | ORAL | 0 refills | Status: DC | PRN

## 2023-08-10 NOTE — TOC Transition Note (Signed)
 Transition of Care [facility]) - Discharge Note   Patient Details  Name: Michele [name] MRN: [id number] Date of Birth: 1965-04-22  [facility] CM/SW Contact:  Alexandra Ice, RN Phone Number: 08/10/2023, 8:36 AM   Clinical Narrative:    Patient has discharge order, to discharge home today. Patient recommended for outpatient therapy. No other TOC needs identified.   Final next level of care: OP Rehab Barriers to Discharge: Barriers Resolved   Patient Goals and CMS Choice            Discharge Placement                Patient to be transferred to facility by: Spouse Name of family member notified: Cashae Weich Patient and family notified of of transfer: 08/10/23  Discharge Plan and Services Additional resources added to the After Visit Summary for                    DME Agency: NA       HH Arranged: NA          Social Drivers of Health (SDOH) Interventions SDOH Screenings   Food Insecurity: Food Insecurity Present (08/07/2023)  Housing: Low Risk  (08/07/2023)  Transportation Needs: No Transportation Needs (08/07/2023)  Utilities: Not At Risk (08/07/2023)  Depression (PHQ2-9): Low Risk  (10/26/2021)  Financial Resource Strain: High Risk (07/26/2023)   Received from [facility]  Physical Activity: Sufficiently Active (11/29/2022)   Received from [facility]  Social Connections: Unknown (08/07/2023)  Stress: Stress Concern Present (11/29/2022)   Received from [facility]  Tobacco Use: High Risk (08/07/2023)  Health Literacy: Adequate Health Literacy (11/29/2022)   Received from [facility]     Readmission Risk Interventions     No data to display

## 2023-08-10 NOTE — Plan of Care (Signed)

## 2023-08-10 NOTE — Discharge Summary (Signed)
 Physician Discharge Summary   Patient: Michele [name] MRN: [id number] DOB: 01/13/1966  Admit date:     08/07/2023  Discharge date: 08/10/23  Discharge Physician: Verla Glaze   PCP: Little Riff, MD   Recommendations at discharge:   Follow-up PCP 5 days Follow-up in neurology 2 weeks Recommend frequent appointments  Discharge Diagnoses: Principal Problem:   Headache Active Problems:   Vertigo   Otitis media, right   Essential hypertension   Left-sided weakness   Hyperlipidemia, unspecified   Hypokalemia   Type 2 diabetes mellitus without complication, with long-term current use of insulin  (HCC)   Obesity (BMI 30-39.9)    Hospital Course: 58 y.o. female with PMH significant for DM2, hypertension, HLD, migraine, was recently overnight in the hospital 6/2 to 6/3 for TIA symptoms.  She had left-sided, aphasia which subsided while in the ED.  Was seen by neurology.  CT head, CTA head and neck and MRI brain were unremarkable.  She had dizziness and hence started and discharged on meclizine  as needed. Patient returned back to ED today within 24 hours of discharge with complaint of persistent dizziness, unable to keep her balance, headache.   Afebrile, hemodynamically stable CBC, BMP unremarkable, troponin not elevated. EDP did curbside discussion with neurologist Dr. Cleone Dad who did not see the need of repeat imaging as they were all done in the last 48 hours only. Patient was given IV Reglan , IV Benadryl , IV Compazine  and meclizine  despite which she continued to have dizziness and hence decision was made to observe overnight..  6/5.  Patient states that she gets vertigo couple times a year but only lasts a short period of time.  This time lasting longer.  Patient states that the room is spinning counterclockwise for her.  Patient not having any nystagmus.  Movement of Augmentin  for otitis media.  And IV fluids.  Continue standing dose meclizine  for now. 6/6.  Today patient  had a headache.  Patient was given IV magnesium , Phenergan , Fioricet , Benadryl  and now Depakote.  Patient on Topamax  and verapamil  to prevent headache.  Also tried Imitrex  and Toradol . 6/7.  Patient states she still has a headache but she feels well enough to go home.  Assessment and Plan: * Headache Patient on Topamax , verapamil  SR and Elavil  to prevent headache.  Patient received numerous doses of medications on 6/6 to try to help out with headache including magnesium , Phenergan , Benadryl , Depakote, Imitrex  and Toradol .  Today patient still has a little headache but only slight and wants to go home.  Vertigo Standing dose meclizine  while in hospital and then switched to as needed. Augmentin  for right otitis media.  Otitis media, right Finish course of Augmentin .  Essential hypertension On verapamil  SR.  Left-sided weakness Recent hospitalization for left-sided weakness and numbness.  MRI of the brain negative.  Patient did not have a stroke or TIA.  Hyperlipidemia, unspecified Continue Lipitor  Hypokalemia Replaced. Hold Maxide.  Type 2 diabetes mellitus without complication, with long-term current use of insulin  (HCC) Will continue lower dose Semglee  insulin  10 units at night and can go back on semaglutide.  Obesity (BMI 30-39.9) BMI 38.89         Consultants: None Procedures performed: None Disposition: Home Diet recommendation:  Cardiac and Carb modified diet DISCHARGE MEDICATION: Allergies as of 08/10/2023   No Known Allergies      Medication List     STOP taking these medications    glipiZIDE 10 MG 24 hr tablet Commonly known as: GLUCOTROL  XL   triamterene-hydrochlorothiazide 37.5-25 MG tablet Commonly known as: MAXZIDE-25       TAKE these medications    amitriptyline  50 MG tablet Commonly known as: ELAVIL  Take 50 mg by mouth at bedtime.   amoxicillin -clavulanate 875-125 MG tablet Commonly known as: AUGMENTIN  Take 1 tablet by mouth every 12  (twelve) hours for 5 days.   aspirin  EC 81 MG tablet Take 1 tablet (81 mg total) by mouth daily. Swallow whole.   atorvastatin  20 MG tablet Commonly known as: LIPITOR Take 20 mg by mouth daily at 6 PM.   baclofen  20 MG tablet Commonly known as: LIORESAL  Take 20 mg by mouth 3 (three) times daily.   butalbital -acetaminophen -caffeine  50-325-40 MG tablet Commonly known as: FIORICET  Take 1 tablet by mouth every 6 (six) hours as needed for migraine.   diclofenac  Sodium 1 % Gel Commonly known as: VOLTAREN  Apply 2 g topically 4 (four) times daily.   fluticasone  50 MCG/ACT nasal spray Commonly known as: FLONASE  Place 1 spray into both nostrils daily.   gabapentin  300 MG capsule Commonly known as: NEURONTIN  Take 1 capsule by mouth 2 (two) times daily.   ibuprofen  800 MG tablet Commonly known as: ADVIL  Take 1 tablet (800 mg total) by mouth every 8 (eight) hours as needed. What changed:  when to take this reasons to take this   lidocaine  5 % Commonly known as: Lidoderm  Place 1 patch onto the skin every 12 (twelve) hours. Remove & Discard patch within 12 hours or as directed by MD   meclizine  25 MG tablet Commonly known as: ANTIVERT  Take 1 tablet (25 mg total) by mouth 3 (three) times daily as needed for dizziness.   metFORMIN  500 MG 24 hr tablet Commonly known as: GLUCOPHAGE -XR Take 1,000 mg by mouth every evening.   pantoprazole  20 MG tablet Commonly known as: Protonix  Take 1 tablet (20 mg total) by mouth daily.   polyethylene glycol 17 g packet Commonly known as: MIRALAX  / GLYCOLAX  Take 17 g by mouth daily as needed for mild constipation or moderate constipation.   Semaglutide (1 MG/DOSE) 4 MG/3ML Sopn Inject 1 mg into the skin once a week.   Semglee  (yfgn) 100 UNIT/ML Pen Generic drug: insulin  glargine-yfgn Inject 10 Units into the skin at bedtime. What changed: how much to take   SUMAtriptan  100 MG tablet Commonly known as: IMITREX  Take 100 mg by mouth every  2 (two) hours as needed for migraine.   topiramate  100 MG tablet Commonly known as: TOPAMAX  Take 100 mg by mouth 2 (two) times daily.   verapamil  180 MG CR tablet Commonly known as: CALAN -SR Take 180 mg by mouth daily.        Follow-up Information     Little Riff, MD Follow up in 5 day(s).   Specialty: Internal Medicine Contact information: [address] [facility] [place] [address] [phone]         Rosan Comfort, MD Follow up in 2 week(s).   Specialty: Neurology Contact information: [phone] [facility] [place] [place] [address] [facility] [place] [address] [phone]                Discharge Exam: Filed Weights   08/07/23 0826 08/07/23 1429  Weight: 107.5 kg 109.3 kg   Physical Exam Cardiovascular:     Rate and Rhythm: Normal rate and regular rhythm.     Heart sounds: Normal heart sounds, S1 normal and S2 normal.  Pulmonary:     Breath sounds: No decreased  breath sounds, wheezing, rhonchi or rales.  Abdominal:     Palpations: Abdomen is soft.     Tenderness: There is no abdominal tenderness.  Musculoskeletal:     Right lower leg: No swelling.     Left lower leg: No swelling.  Skin:    General: Skin is warm.     Findings: No rash.  Neurological:     Mental Status: She is alert and oriented to person, place, and time.      Condition at discharge: stable  The results of significant diagnostics from this hospitalization (including imaging, microbiology, ancillary and laboratory) are listed below for reference.   Imaging Studies: ECHOCARDIOGRAM COMPLETE Result Date: 08/06/2023    ECHOCARDIOGRAM REPORT   Patient Name:   Shandora ANN Persley Date of Exam: 08/06/2023 Medical Rec #:  [id number]        Height:       66.0 in Accession #:    [id number]       Weight:       251.1 lb Date of Birth:  May 26, 1965       BSA:          2.203 m Patient Age:    57 years         BP:           138/81 mmHg Patient Gender: F                HR:            69 bpm. Exam Location:  [facility] Procedure: 2D Echo, Cardiac Doppler, Color Doppler and Saline Contrast Bubble            Study (Both Spectral and Color Flow Doppler were utilized during            procedure). Indications:     TIA G45.9  History:         Patient has no prior history of Echocardiogram examinations.                  Risk Factors:Diabetes and Hypertension. Migraines.  Sonographer:     Broadus Canes Referring Phys:  [id number] [place] Diagnosing Phys: Lida Reeks Alluri IMPRESSIONS  1. Left ventricular ejection fraction, by estimation, is 60 to 65%. The left ventricle has normal function. The left ventricle has no regional wall motion abnormalities. Left ventricular diastolic parameters were normal.  2. Right ventricular systolic function is normal. The right ventricular size is normal.  3. The mitral valve is normal in structure. No evidence of mitral valve regurgitation.  4. The aortic valve was not well visualized. Aortic valve regurgitation is not visualized.  5. Agitated saline contrast bubble study was negative, with no evidence of any interatrial shunt. FINDINGS  Left Ventricle: Left ventricular ejection fraction, by estimation, is 60 to 65%. The left ventricle has normal function. The left ventricle has no regional wall motion abnormalities. The left ventricular internal cavity size was normal in size. There is  no left ventricular hypertrophy. Left ventricular diastolic parameters were normal. Right Ventricle: The right ventricular size is normal. No increase in right ventricular wall thickness. Right ventricular systolic function is normal. Left Atrium: Left atrial size was normal in size. Right Atrium: Right atrial size was normal in size. Pericardium: There is no evidence of pericardial effusion. Mitral Valve: The mitral valve is normal in structure. No evidence of mitral valve regurgitation. MV peak gradient, 3.5 mmHg. The mean mitral valve gradient is 2.0 mmHg. Tricuspid Valve: The tricuspid  valve is normal in structure. Tricuspid valve regurgitation is not demonstrated. Aortic Valve: The aortic valve was not well visualized. Aortic valve regurgitation is not visualized. Aortic valve mean gradient measures 3.0 mmHg. Aortic valve peak gradient measures 5.8 mmHg. Aortic valve area, by VTI measures 2.61 cm. Pulmonic Valve: The pulmonic valve was not well visualized. Pulmonic valve regurgitation is trivial. Aorta: The aortic root and ascending aorta are structurally normal, with no evidence of dilitation. IAS/Shunts: The atrial septum is grossly normal. Agitated saline contrast was given intravenously to evaluate for intracardiac shunting. Agitated saline contrast bubble study was negative, with no evidence of any interatrial shunt.  LEFT VENTRICLE PLAX 2D LVIDd:         4.30 cm   Diastology LVIDs:         2.70 cm   LV e' medial:    8.49 cm/s LV PW:         0.90 cm   LV E/e' medial:  8.7 LV IVS:        1.00 cm   LV e' lateral:   12.40 cm/s LVOT diam:     2.00 cm   LV E/e' lateral: 6.0 LV SV:         63 LV SV Index:   29 LVOT Area:     3.14 cm  RIGHT VENTRICLE RV Basal diam:  2.90 cm RV Mid diam:    2.70 cm LEFT ATRIUM             Index        RIGHT ATRIUM           Index LA diam:        3.80 cm 1.73 cm/m   RA Area:     11.00 cm LA Vol (A2C):   30.4 ml 13.80 ml/m  RA Volume:   21.70 ml  9.85 ml/m LA Vol (A4C):   44.4 ml 20.16 ml/m LA Biplane Vol: 38.9 ml 17.66 ml/m  AORTIC VALVE AV Area (Vmax):    2.49 cm AV Area (Vmean):   2.50 cm AV Area (VTI):     2.61 cm AV Vmax:           120.00 cm/s AV Vmean:          82.600 cm/s AV VTI:            0.240 m AV Peak Grad:      5.8 mmHg AV Mean Grad:      3.0 mmHg LVOT Vmax:         95.00 cm/s LVOT Vmean:        65.700 cm/s LVOT VTI:          0.200 m LVOT/AV VTI ratio: 0.83  AORTA Ao Root diam: 3.10 cm MITRAL VALVE               TRICUSPID VALVE MV Area (PHT): 3.33 cm    TR Peak grad:   14.3 mmHg MV Area VTI:   2.61 cm    TR Vmax:        189.00 cm/s MV Peak  grad:  3.5 mmHg MV Mean grad:  2.0 mmHg    SHUNTS MV Vmax:       0.93 m/s    Systemic VTI:  0.20 m MV Vmean:      64.0 cm/s   Systemic Diam: 2.00 cm MV Decel Time: 228 msec MV E velocity: 73.90 cm/s MV A velocity: 95.90 cm/s MV E/A ratio:  0.77 Joetta Mustache Electronically  signed by Joetta Mustache Signature Date/Time: 08/06/2023/4:43:41 PM    Final    DG Cervical Spine 2 or 3 views Result Date: 08/05/2023 CLINICAL DATA:  Cervical neck pain EXAM: CERVICAL SPINE - 2-3 VIEW COMPARISON:  None Available. FINDINGS: The skull base through C6 are visualized on the lateral view. C7 and the cervicothoracic junction are obscured by overlapping osseous and soft tissue structures. Straightening of normal lordosis. No evidence of fracture. The disc spaces are preserved. No evidence of focal bone abnormality or bone destruction. No prevertebral soft tissue thickening. IMPRESSION: 1. Straightening of normal lordosis. No acute osseous abnormality. 2. C7 and the cervicothoracic junction are obscured by overlapping osseous and soft tissue structures and not well assessed. Electronically Signed   By: Chadwick Colonel M.D.   On: 08/05/2023 15:18   MR BRAIN WO CONTRAST Result Date: 08/05/2023 CLINICAL DATA:  58 year old female who woke up at 0400 hours with left face and extremity weakness. Abnormal speech. Code stroke presentation. EXAM: MRI HEAD WITHOUT CONTRAST TECHNIQUE: Multiplanar, multiecho pulse sequences of the brain and surrounding structures were obtained without intravenous contrast. COMPARISON:  CT head, CTA and CTP this morning. Previous brain MRI 07/24/2023. FINDINGS: Brain: Normal cerebral volume for age. No restricted diffusion to suggest acute infarction. No midline shift, mass effect, evidence of mass lesion, ventriculomegaly, extra-axial collection or acute intracranial hemorrhage. Cervicomedullary junction and pituitary are within normal limits. Martina Sledge and white matter signal is stable and within normal limits for  age. Minimal nonspecific white matter T2 and FLAIR hyperintensity (such as on series 25, image 30). No cortical encephalomalacia or chronic cerebral blood products. Deep gray nuclei, brainstem and cerebellum appear negative. Vascular: Major intracranial vascular flow voids are stable. There is a degree of generalized intracranial artery tortuosity. Skull and upper cervical spine: Negative. Visualized bone marrow signal is within normal limits. Sinuses/Orbits: Negative. Other: Mastoids are clear. Grossly normal visible internal auditory structures. Negative visible scalp and face. IMPRESSION: No acute intracranial abnormality. Stable from last month, mild for age nonspecific white matter signal changes. Electronically Signed   By: Marlise Simpers M.D.   On: 08/05/2023 08:51   CT ANGIO HEAD NECK W WO CM W PERF (CODE STROKE) Result Date: 08/05/2023 CLINICAL DATA:  58 year old female code stroke presentation. Left side deficits. EXAM: CT ANGIOGRAPHY HEAD AND NECK CT PERFUSION BRAIN TECHNIQUE: Multidetector CT imaging of the head and neck was performed using the standard protocol during bolus administration of intravenous contrast. Multiplanar CT image reconstructions and MIPs were obtained to evaluate the vascular anatomy. Carotid stenosis measurements (when applicable) are obtained utilizing NASCET criteria, using the distal internal carotid diameter as the denominator. Multiphase CT imaging of the brain was performed following IV bolus contrast injection. Subsequent parametric perfusion maps were calculated using RAPID software. RADIATION DOSE REDUCTION: This exam was performed according to the departmental dose-optimization program which includes automated exposure control, adjustment of the mA and/or kV according to patient size and/or use of iterative reconstruction technique. CONTRAST:  OMNIPAQUE  IOHEXOL  350 MG/ML SOLN COMPARISON:  Plain head CT 0550 hours today. CTA head and neck, brain MRI 07/24/2023. FINDINGS:  CT Brain Perfusion Findings: ASPECTS: 10 CBF (<30%) Volume: 0mL Perfusion (Tmax>6.0s) volume: 0mL Mismatch Volume: Not applicablemL Infarction Location:Not applicable CTA NECK Skeleton: Recent maxillary dental extractions, absent dentition. Similar reversal of cervical lordosis. No acute osseous abnormality identified. Upper chest: Negative. Other neck: Nonvascular neck soft tissue spaces are stable and within normal limits. Aortic arch: Aberrant origin right subclavian artery. Mildly tortuous aortic  arch without plaque. Right carotid system: Tortuous proximal right CCA. Normal right carotid bifurcation. Tortuous right ICA below the skull base without plaque or stenosis. Left carotid system: Similar tortuosity. No significant carotid bifurcation plaque or stenosis. Vertebral arteries: Aberrant origin proximal right subclavian artery without stenosis. Normal right vertebral artery origin. Non dominant right vertebral artery is stable with V2 segment tortuosity, patent to the skull base without stenosis. Proximal left subclavian artery and left vertebral artery origin are normal. Dominant left vertebral artery is patent and normal to the skull base. CTA HEAD Posterior circulation: Patent distal vertebral arteries and vertebrobasilar junction without stenosis. Dominant, mildly tortuous left V4 segment. Patent PICA origins. Patent basilar artery with tortuosity. Normal SCA and PCA origins. Small posterior communicating arteries. Bilateral PCA branches are within normal limits, mildly tortuous. Anterior circulation: Both ICA siphons are patent. No right siphon plaque or stenosis. Minimal left siphon calcified plaque without stenosis. Normal posterior communicating artery origins. Patent carotid termini. Normal MCA and ACA origins. Mildly dominant right A1. Diminutive anterior communicating artery. Bilateral ACA branches are within normal limits. Left MCA M1 segment and bifurcation are patent without stenosis. Right MCA  M1 segment and bifurcation are patent without stenosis. Bilateral MCA branches are stable and within normal limits. Venous sinuses: Patent. Anatomic variants: Dominant left and diminutive right vertebral arteries. Aberrant right subclavian artery origin. Mildly dominant right A1. Review of the MIP images confirms the above findings IMPRESSION: 1. Negative CT Perfusion. 2. Stable CTA Head and Neck with no large vessel occlusion and no significant atherosclerosis. Electronically Signed   By: Marlise Simpers M.D.   On: 08/05/2023 06:31   CT HEAD CODE STROKE WO CONTRAST Addendum Date: 08/05/2023 ADDENDUM REPORT: 08/05/2023 06:08 ADDENDUM: Study discussed by telephone with Dr. Lind Repine on 08/05/2023 at 0559 hours. Electronically Signed   By: Marlise Simpers M.D.   On: 08/05/2023 06:08   Result Date: 08/05/2023 CLINICAL DATA:  Code stroke. 58 year old female who woke with left side symptoms. Weakness, aphasia. EXAM: CT HEAD WITHOUT CONTRAST TECHNIQUE: Contiguous axial images were obtained from the base of the skull through the vertex without intravenous contrast. RADIATION DOSE REDUCTION: This exam was performed according to the departmental dose-optimization program which includes automated exposure control, adjustment of the mA and/or kV according to patient size and/or use of iterative reconstruction technique. COMPARISON:  Brain MRI, head CT 07/24/2023. FINDINGS: Brain: Normal cerebral volume. No midline shift, ventriculomegaly, mass effect, evidence of mass lesion, intracranial hemorrhage or evidence of cortically based acute infarction. Gray-white matter differentiation is within normal limits throughout the brain. Vascular: No suspicious intracranial vascular hyperdensity. Intracranial artery tortuosity better demonstrated on the recent MRI. Skull: Intact.  No acute osseous abnormality identified. Sinuses/Orbits: Visualized paranasal sinuses and mastoids are stable and well aerated. Other: No gaze deviation now.  Visualized orbits and scalp soft tissues are within normal limits. ASPECTS [facility] Stroke Program Early CT Score) Total score (0-10 with 10 being normal): 10 IMPRESSION: Stable and Normal noncontrast CT appearance of the brain. ASPECTS 10. Electronically Signed: By: Marlise Simpers M.D. On: 08/05/2023 05:57   MR BRAIN WO CONTRAST Result Date: 07/24/2023 CLINICAL DATA:  Neuro deficit with acute stroke suspected. Dizziness and lightheadedness. EXAM: MRI HEAD WITHOUT CONTRAST TECHNIQUE: Multiplanar, multiecho pulse sequences of the brain and surrounding structures were obtained without intravenous contrast. COMPARISON:  Brain MRI 05/01/2020 FINDINGS: Brain: No acute infarction, hemorrhage, hydrocephalus, extra-axial collection or mass lesion. Small FLAIR hyperintensities in the cerebral white matter with nonspecific pattern, small-vessel risk  factors in the chart favor early chronic small vessel ischemia. Brain volume is normal. Vascular: Preserved flow voids. Left vertebral tortuosity indenting the left lower brainstem, also seen on prior. Skull and upper cervical spine: Normal marrow signal. Sinuses/Orbits: Minor mucosal thickening especially in the right maxillary sinus. IMPRESSION: No acute finding.  Negative for acute infarct. Mild chronic white matter disease, medical history suggesting early small vessel ischemia, progressed from 2022. Electronically Signed   By: Ronnette Coke M.D.   On: 07/24/2023 05:17   CT Angio Head Neck W WO CM Result Date: 07/24/2023 CLINICAL DATA:  Dizziness and lightheadedness for 3 days EXAM: CT ANGIOGRAPHY HEAD AND NECK WITH AND WITHOUT CONTRAST TECHNIQUE: Multidetector CT imaging of the head and neck was performed using the standard protocol during bolus administration of intravenous contrast. Multiplanar CT image reconstructions and MIPs were obtained to evaluate the vascular anatomy. Carotid stenosis measurements (when applicable) are obtained utilizing NASCET criteria, using the  distal internal carotid diameter as the denominator. RADIATION DOSE REDUCTION: This exam was performed according to the departmental dose-optimization program which includes automated exposure control, adjustment of the mA and/or kV according to patient size and/or use of iterative reconstruction technique. CONTRAST:  75mL OMNIPAQUE  IOHEXOL  350 MG/ML SOLN COMPARISON:  Head CT 02/17/2023 FINDINGS: Head CT: No evidence of infarct, hemorrhage, hydrocephalus, mass, or collection. CTA NECK FINDINGS Aortic arch: No acute finding. 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 Right carotid system: ICA tortuosity. No beading, stenosis, or ulceration Left carotid system: ICA tortuosity. No stenosis, beading, or ulceration Vertebral arteries: Dominant left vertebral artery. The vertebral arteries are tortuous but smoothly contoured and widely patent to the dura. No proximal subclavian stenosis. Skeleton: No acute or aggressive finding. Other neck: No acute finding Upper chest: Clear apical lungs Review of the MIP images confirms the above findings CTA HEAD FINDINGS Anterior circulation: No significant stenosis, proximal occlusion, aneurysm, or vascular malformation. Posterior circulation: No significant stenosis, proximal occlusion, aneurysm, or vascular malformation. Venous sinuses: Unremarkable Review of the MIP images confirms the above findings IMPRESSION: No emergent finding. No stenosis or irregularity of major arteries in the head and neck. Electronically Signed   By: Ronnette Coke M.D.   On: 07/24/2023 04:09    Microbiology: Results for orders placed or performed during the hospital encounter of 02/17/23  SARS Coronavirus 2 by RT PCR (hospital order, performed in [facility] hospital lab) *cepheid single result test* Anterior Nasal Swab     Status: None   Collection Time: 02/17/23  4:45 PM   Specimen: Anterior Nasal Swab  Result Value Ref Range Status   SARS Coronavirus 2 by RT PCR NEGATIVE  NEGATIVE Final    Comment: (NOTE) SARS-CoV-2 target nucleic acids are NOT DETECTED.  The SARS-CoV-2 RNA is generally detectable in upper and lower respiratory specimens during the acute phase of infection. The lowest concentration of SARS-CoV-2 viral copies this assay can detect is 250 copies / mL. A negative result does not preclude SARS-CoV-2 infection and should not be used as the sole basis for treatment or other patient management decisions.  A negative result may occur with improper specimen collection / handling, submission of specimen other than nasopharyngeal swab, presence of viral mutation(s) within the areas targeted by this assay, and inadequate number of viral copies (<250 copies / mL). A negative result must be combined with clinical observations, patient history, and epidemiological information.  Fact Sheet for Patients:   RoadLapTop.co.za  Fact Sheet for Healthcare Providers: http://kim-miller.com/  This test  is not yet approved or  cleared by the [place]  FDA and has been authorized for detection and/or diagnosis of SARS-CoV-2 by FDA under an Emergency Use Authorization (EUA).  This EUA will remain in effect (meaning this test can be used) for the duration of the COVID-19 declaration under Section 564(b)(1) of the Act, 21 U.S.C. section 360bbb-3(b)(1), unless the authorization is terminated or revoked sooner.  Performed at [facility], [address] [place], [place] [address]     Labs: CBC: Recent Labs  Lab 08/05/23 0542 08/07/23 0906 08/08/23 0214 08/10/23 0513  WBC 7.7 6.9 6.5 7.0  NEUTROABS 3.8 3.6  --   --   HGB 12.3 12.2 11.7* 12.6  HCT 37.9 39.0 36.7 39.2  MCV 81.5 82.3 81.6 80.3  PLT 287 281 274 301   Basic Metabolic Panel: Recent Labs  Lab 08/05/23 0542 08/05/23 1317 08/07/23 0906 08/08/23 0214 08/10/23 0513  NA 141  --  141 140 140  K 3.1*  --  3.5 3.1* 3.5  CL 105  --   109 110 109  CO2 29  --  26 20* 23  GLUCOSE 101*  --  101* 190* 120*  BUN 18  --  12 13 14   CREATININE 0.88  --  0.83 1.00 0.89  CALCIUM  9.0  --  9.0 8.7* 8.6*  MG  --  2.1  --   --   --    Liver Function Tests: Recent Labs  Lab 08/05/23 0542  AST 14*  ALT 14  ALKPHOS 64  BILITOT 0.3  PROT 6.1*  ALBUMIN 3.5   CBG: Recent Labs  Lab 08/09/23 0821 08/09/23 1226 08/09/23 1629 08/09/23 2106 08/10/23 0752  GLUCAP 107* 96 135* 140* 128*    Discharge time spent: greater than 30 minutes.  Signed: Verla Glaze, MD [facility] 08/10/2023

## 2023-08-10 NOTE — Progress Notes (Signed)
 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

## 2023-08-10 NOTE — Plan of Care (Signed)
  Problem: Education: Goal: Ability to describe self-care measures that may prevent or decrease complications (Diabetes Survival Skills Education) will improve Outcome: Adequate for Discharge Goal: Individualized Educational Video(s) Outcome: Adequate for Discharge   Problem: Coping: Goal: Ability to adjust to condition or change in health will improve Outcome: Adequate for Discharge   Problem: Fluid Volume: Goal: Ability to maintain a balanced intake and output will improve Outcome: Adequate for Discharge   Problem: Health Behavior/Discharge Planning: Goal: Ability to identify and utilize available resources and services will improve Outcome: Adequate for Discharge Goal: Ability to manage health-related needs will improve Outcome: Adequate for Discharge   Problem: Metabolic: Goal: Ability to maintain appropriate glucose levels will improve Outcome: Adequate for Discharge   Problem: Nutritional: Goal: Maintenance of adequate nutrition will improve Outcome: Adequate for Discharge Goal: Progress toward achieving an optimal weight will improve Outcome: Adequate for Discharge   Problem: Skin Integrity: Goal: Risk for impaired skin integrity will decrease Outcome: Adequate for Discharge   Problem: Tissue Perfusion: Goal: Adequacy of tissue perfusion will improve Outcome: Adequate for Discharge   Problem: Education: Goal: Knowledge of General Education information will improve Description: Including pain rating scale, medication(s)/side effects and non-pharmacologic comfort measures Outcome: Adequate for Discharge   Problem: Health Behavior/Discharge Planning: Goal: Ability to manage health-related needs will improve Outcome: Adequate for Discharge   Problem: Clinical Measurements: Goal: Ability to maintain clinical measurements within normal limits will improve Outcome: Adequate for Discharge Goal: Will remain free from infection Outcome: Adequate for Discharge Goal:  Diagnostic test results will improve Outcome: Adequate for Discharge Goal: Respiratory complications will improve Outcome: Adequate for Discharge Goal: Cardiovascular complication will be avoided Outcome: Adequate for Discharge   Problem: Activity: Goal: Risk for activity intolerance will decrease Outcome: Adequate for Discharge   Problem: Nutrition: Goal: Adequate nutrition will be maintained Outcome: Adequate for Discharge   Problem: Coping: Goal: Level of anxiety will decrease Outcome: Adequate for Discharge   Problem: Elimination: Goal: Will not experience complications related to bowel motility Outcome: Adequate for Discharge Goal: Will not experience complications related to urinary retention Outcome: Adequate for Discharge   Problem: Pain Managment: Goal: General experience of comfort will improve and/or be controlled Outcome: Adequate for Discharge   Problem: Safety: Goal: Ability to remain free from injury will improve Outcome: Adequate for Discharge   Problem: Skin Integrity: Goal: Risk for impaired skin integrity will decrease Outcome: Adequate for Discharge   Problem: Acute Rehab PT Goals(only PT should resolve) Goal: Pt Will Go Supine/Side To Sit Outcome: Adequate for Discharge Goal: Patient Will Transfer Sit To/From Stand Outcome: Adequate for Discharge Goal: Pt Will Ambulate Outcome: Adequate for Discharge Goal: Pt Will Go Up/Down Stairs Outcome: Adequate for Discharge

## 2023-10-05 ENCOUNTER — Emergency Department

## 2023-10-05 ENCOUNTER — Emergency Department: Admission: EM | Admit: 2023-10-05 | Discharge: 2023-10-05 | Disposition: A | Discharge status: Home / Self Care

## 2023-10-05 ENCOUNTER — Other Ambulatory Visit: Payer: Self-pay

## 2023-10-05 DIAGNOSIS — R1013 Epigastric pain: Secondary | ICD-10-CM | POA: Diagnosis present

## 2023-10-05 DIAGNOSIS — R066 Hiccough: Secondary | ICD-10-CM | POA: Insufficient documentation

## 2023-10-05 DIAGNOSIS — E119 Type 2 diabetes mellitus without complications: Secondary | ICD-10-CM | POA: Insufficient documentation

## 2023-10-05 DIAGNOSIS — I10 Essential (primary) hypertension: Secondary | ICD-10-CM | POA: Diagnosis not present

## 2023-10-05 DIAGNOSIS — E876 Hypokalemia: Secondary | ICD-10-CM | POA: Insufficient documentation

## 2023-10-05 LAB — COMPREHENSIVE METABOLIC PANEL WITH GFR
ALT: 17 U/L (ref 0–44)
AST: 26 U/L (ref 15–41)
Albumin: 3.8 g/dL (ref 3.5–5.0)
Alkaline Phosphatase: 69 U/L (ref 38–126)
Anion gap: 14 (ref 5–15)
BUN: 14 mg/dL (ref 6–20)
CO2: 26 mmol/L (ref 22–32)
Calcium: 9.3 mg/dL (ref 8.9–10.3)
Chloride: 99 mmol/L (ref 98–111)
Creatinine, Ser: 1.16 mg/dL — ABNORMAL HIGH (ref 0.44–1.00)
GFR, Estimated: 56 mL/min — ABNORMAL LOW (ref >60)
Glucose, Bld: 180 mg/dL — ABNORMAL HIGH (ref 70–99)
Potassium: 2.7 mmol/L — CL (ref 3.5–5.1)
Sodium: 139 mmol/L (ref 135–145)
Total Bilirubin: 0.7 mg/dL (ref 0.0–1.2)
Total Protein: 6.8 g/dL (ref 6.5–8.1)

## 2023-10-05 LAB — CBC
HCT: 43.3 % (ref 36.0–46.0)
Hemoglobin: 14 g/dL (ref 12.0–15.0)
MCH: 26.1 pg (ref 26.0–34.0)
MCHC: 32.3 g/dL (ref 30.0–36.0)
MCV: 80.8 fL (ref 80.0–100.0)
Platelets: 306 K/uL (ref 150–400)
RBC: 5.36 MIL/uL — ABNORMAL HIGH (ref 3.87–5.11)
RDW: 13.3 % (ref 11.5–15.5)
WBC: 6.9 K/uL (ref 4.0–10.5)
nRBC: 0 % (ref 0.0–0.2)

## 2023-10-05 LAB — LIPASE, BLOOD: Lipase: 40 U/L (ref 11–51)

## 2023-10-05 LAB — POTASSIUM: Potassium: 3.4 mmol/L — ABNORMAL LOW (ref 3.5–5.1)

## 2023-10-05 MED ORDER — ONDANSETRON HCL 4 MG/2ML IJ SOLN
4.0000 mg | Freq: Once | INTRAMUSCULAR | Status: AC
  Administered 2023-10-05: 4 mg via INTRAVENOUS
  Filled 2023-10-05: qty 2

## 2023-10-05 MED ORDER — IOHEXOL 300 MG/ML  SOLN
100.0000 mL | Freq: Once | INTRAMUSCULAR | Status: AC | PRN
  Administered 2023-10-05: 100 mL via INTRAVENOUS

## 2023-10-05 MED ORDER — POTASSIUM CHLORIDE CRYS ER 20 MEQ PO TBCR: 20.0000 meq | EXTENDED_RELEASE_TABLET | Freq: Every day | ORAL | 0 refills | Status: DC

## 2023-10-05 MED ORDER — ALUM & MAG HYDROXIDE-SIMETH 200-200-20 MG/5ML PO SUSP
30.0000 mL | Freq: Once | ORAL | Status: AC
  Administered 2023-10-05: 30 mL via ORAL
  Filled 2023-10-05: qty 30

## 2023-10-05 MED ORDER — MILK OF MAGNESIA 7.75 % PO SUSP: 30.0000 mL | Freq: Every day | ORAL | 3 refills | Status: AC | PRN

## 2023-10-05 MED ORDER — FAMOTIDINE IN NACL 20-0.9 MG/50ML-% IV SOLN
20.0000 mg | Freq: Once | INTRAVENOUS | Status: AC
  Administered 2023-10-05: 20 mg via INTRAVENOUS
  Filled 2023-10-05: qty 50

## 2023-10-05 MED ORDER — POLYETHYLENE GLYCOL 3350 17 G PO PACK
17.0000 g | PACK | Freq: Every day | ORAL | 0 refills | Status: AC
Start: 2023-10-05 — End: ?

## 2023-10-05 MED ORDER — SUCRALFATE 1 G PO TABS: 1.0000 g | ORAL_TABLET | Freq: Three times a day (TID) | ORAL | 0 refills | Status: AC

## 2023-10-05 MED ORDER — SUCRALFATE 1 G PO TABS
1.0000 g | ORAL_TABLET | Freq: Once | ORAL | Status: AC
  Administered 2023-10-05: 1 g via ORAL
  Filled 2023-10-05: qty 1

## 2023-10-05 MED ORDER — FAMOTIDINE 20 MG PO TABS: 20.0000 mg | ORAL_TABLET | Freq: Every day | ORAL | 0 refills | Status: AC

## 2023-10-05 MED ORDER — LACTATED RINGERS IV BOLUS
1000.0000 mL | Freq: Once | INTRAVENOUS | Status: AC
  Administered 2023-10-05: 1000 mL via INTRAVENOUS

## 2023-10-05 MED ORDER — PANTOPRAZOLE SODIUM 40 MG IV SOLR
40.0000 mg | Freq: Once | INTRAVENOUS | Status: AC
  Administered 2023-10-05: 40 mg via INTRAVENOUS
  Filled 2023-10-05: qty 10

## 2023-10-05 MED ORDER — POTASSIUM CHLORIDE CRYS ER 20 MEQ PO TBCR
40.0000 meq | EXTENDED_RELEASE_TABLET | Freq: Once | ORAL | Status: AC
  Administered 2023-10-05: 40 meq via ORAL
  Filled 2023-10-05: qty 2

## 2023-10-05 NOTE — ED Triage Notes (Signed)
 Pt to ED for nausea and like the food is just sitting on top of my stomach since 2 weeks. Pt takes Ozempic. States has also been hiccupping a lot. Also constipated since 2 weeks. LBM 2 weeks ago, states this is normal for her.

## 2023-10-05 NOTE — Discharge Instructions (Addendum)
 You were seen in the emergency department for epigastric abdominal pain, constipation.  Workup today was reassuring but this does not mean that nothing is wrong but whether you are safe to continue the workup as an outpatient.  Please call your primary care physician.  Tell them that your potassium was low in the emergency department and you were started on 3 days of potassium pills.  You will need a repeat potassium check in 1 to 2 weeks.  Please stick to bland foods.  Please call the gastroenterology department at all facility and arrange today outpatient follow-up.  Return if any acutely worsening symptoms or any other emergency. -- RETURN PRECAUTIONS & AFTERCARE: (ENGLISH) RETURN PRECAUTIONS: Return immediately to the emergency department or see/call your doctor if you feel worse, weak or have changes in speech or vision, are short of breath, have fever, vomiting, pain, bleeding or dark stool, trouble urinating or any new issues. Return here or see/call your doctor if not improving as expected for your suspected condition. FOLLOW-UP CARE: Call your doctor and/or any doctors we referred you to for more advice and to make an appointment. Do this today, tomorrow or after the weekend. Some doctors only take PPO insurance so if you have HMO insurance you may want to contact your HMO or your regular doctor for referral to a specialist within your plan. Either way tell the doctor's office that it was a referral from the emergency department so you get the soonest possible appointment.  YOUR TEST RESULTS: Take result reports of any blood or urine tests, imaging tests and EKG's to your doctor and any referral doctor. Have any abnormal tests repeated. Your doctor or a referral doctor can let you know when this should be done. Also make sure your doctor contacts this hospital to get any test results that are not currently available such as cultures or special tests for infection and final imaging reports, which are  often not available at the time you leave the ER but which may list additional important findings that are not documented on the preliminary report. BLOOD PRESSURE: If your blood pressure was greater than 120/80 have your blood pressure rechecked within 1 to 2 weeks. MEDICATION SIDE EFFECTS: Do not drive, walk, bike, take the bus, etc. if you have received or are being prescribed any sedating medications such as those for pain or anxiety or certain antihistamines like Benadryl . If you have been give one of these here get a taxi home or have a friend drive you home. Ask your pharmacist to counsel you on potential side effects of any new medication

## 2023-10-05 NOTE — ED Notes (Signed)
 EKG completed

## 2023-10-05 NOTE — ED Provider Notes (Signed)
 [facility] Provider Note    Event Date/Time   First MD Initiated Contact with Patient 10/05/23 1158     (approximate)   History   Bloated, Nausea, Constipation, and Abdominal Pain   HPI  Michele [name] is a 58 y.o. female hypertension, HLD, migraine, and type 2 diabetes who presents to the emergency department with 2 weeks of intermittent epigastric abdominal pain, hiccups and constipation.  Patient states that she has experienced this pain several times previously but has not been diagnosed of anything.  Denies any chest pain shortness of breath.  She is on Ozempic but this is not a new medication.  She is not on a bowel regimen at home.  Her only abdominal surgery to this point has been a C-section.  She does have a primary care physician that she follows up regularly with denies any dysuria hematuria      Physical Exam   Triage Vital Signs: ED Triage Vitals  Encounter Vitals Group     BP 10/05/23 1104 126/85     Girls Systolic BP Percentile --      Girls Diastolic BP Percentile --      Boys Systolic BP Percentile --      Boys Diastolic BP Percentile --      Pulse Rate 10/05/23 1104 77     Resp 10/05/23 1104 16     Temp 10/05/23 1104 97.9 F (36.6 C)     Temp Source 10/05/23 1104 Oral     SpO2 10/05/23 1104 100 %     Weight 10/05/23 1103 220 lb (99.8 kg)     Height 10/05/23 1103 5' 6 (1.676 m)     Head Circumference --      Peak Flow --      Pain Score 10/05/23 1101 9     Pain Loc --      Pain Education --      Exclude from Growth Chart --     Most recent vital signs: Vitals:   10/05/23 1104 10/05/23 1445  BP: 126/85 124/86  Pulse: 77 80  Resp: 16 17  Temp: 97.9 F (36.6 C) 98 F (36.7 C)  SpO2: 100% 99%    Nursing Triage Note reviewed. Vital signs reviewed and patients oxygen saturation is normoxic  General: Patient is well nourished, well developed, awake and alert, resting comfortably in no acute distress Head:  Normocephalic and atraumatic Eyes: Normal inspection, extraocular muscles intact, no conjunctival pallor Ear, nose, throat: Normal external exam Neck: Normal range of motion Respiratory: Patient is in no respiratory distress, lungs CTAB Cardiovascular: Patient is not tachycardic, RRR without murmur appreciated GI: Abd Soft, mild epigastric tenderness to palpation, but no tenderness to palpation otherwise, no rebound no guarding Back: Normal inspection of the back with good strength and range of motion throughout all ext Extremities: pulses intact with good cap refills, no LE pitting edema or calf tenderness Neuro: The patient is alert and oriented to person, place, and time, appropriately conversive, with 5/5 bilat UE/LE strength, no gross motor or sensory defects noted. Coordination appears to be adequate. Skin: Warm, dry, and intact Psych: normal mood and affect, no SI or HI  ED Results / Procedures / Treatments   Labs (all labs ordered are listed, but only abnormal results are displayed) Labs Reviewed  COMPREHENSIVE METABOLIC PANEL WITH GFR - Abnormal; Notable for the following components:      Result Value   Potassium 2.7 (*)    Glucose,  Bld 180 (*)    Creatinine, Ser 1.16 (*)    GFR, Estimated 56 (*)    All other components within normal limits  CBC - Abnormal; Notable for the following components:   RBC 5.36 (*)    All other components within normal limits  POTASSIUM - Abnormal; Notable for the following components:   Potassium 3.4 (*)    All other components within normal limits  LIPASE, BLOOD  URINALYSIS, ROUTINE W REFLEX MICROSCOPIC     EKG EKG and rhythm strip are interpreted by myself:   EKG: [Normal sinus rhythm] at heart rate of 64, normal QRS duration, QTc 422 nonspecific ST segments and T waves no ectopy EKG not consistent with Acute STEMI Rhythm strip: NSR in lead II   RADIOLOGY CT abd and pelvis with iv contrast: No acute abnormality on my independent  review interpretation and radiologist agrees.  Of note, they do not see any significant constipation    PROCEDURES:  Critical Care performed: No  Procedures   MEDICATIONS ORDERED IN ED: Medications  potassium chloride  SA (KLOR-CON  M) CR tablet 40 mEq (40 mEq Oral Given 10/05/23 1238)  pantoprazole  (PROTONIX ) injection 40 mg (40 mg Intravenous Given 10/05/23 1238)  famotidine  (PEPCID ) IVPB 20 mg premix (0 mg Intravenous Stopped 10/05/23 1314)  ondansetron  (ZOFRAN ) injection 4 mg (4 mg Intravenous Given 10/05/23 1236)  lactated ringers  bolus 1,000 mL (0 mLs Intravenous Stopped 10/05/23 1446)  iohexol  (OMNIPAQUE ) 300 MG/ML solution 100 mL (100 mLs Intravenous Contrast Given 10/05/23 1306)  sucralfate  (CARAFATE ) tablet 1 g (1 g Oral Given 10/05/23 1418)  alum & mag hydroxide-simeth (MAALOX/MYLANTA) 200-200-20 MG/5ML suspension 30 mL (30 mLs Oral Given 10/05/23 1419)     IMPRESSION / MDM / ASSESSMENT AND PLAN / ED COURSE                                Differential diagnosis includes, but is not limited to, GERD, gastroenteritis, electrolyte derangement, colitis, diverticulitis  ED course: Patient is well-appearing and abdominal exam demonstrates no evidence of peritonitis CT abdomen pelvis was unremarkable including lack of significant stool burden.  Will plan on starting the patient with an outpatient stool regimen.  She is given Zofran , potassium, Protonix , IV fluids Sucrafate and Maalox with some improvement with symptoms.  She feels comfortable following up with her primary care physician and will give her the number to a gastroenterologist.   Clinical Course as of 10/05/23 1644  Sat Oct 05, 2023  1355 Patient updated on the results thus far.  Abdominal exam is soft nontender.  She does request something to help her poop at home.  Has fluids running.  She request something more for heartburn [HD]  1406 Comprehensive metabolic panel(!!) Patient does have hypokalemia with a potassium of 2.7 and  a mild acute renal insufficiency with creatinine of 1.16.  Will replete with oral potassium and give her IV fluids.  Repeat sent [HD]  1406 CBC(!) No leukocytosis [HD]  1407 Lipase: 40 Lipase is not elevated [HD]  1426 Potassium(!): 3.4 Much improved will work on discharge [HD]    Clinical Course User Index [HD] Nicholaus Rolland BRAVO, MD   At time of discharge there is no evidence of acute life, limb, vision, or fertility threat. Patient has stable vital signs, pain is well controlled, patient is ambulatory and p.o. tolerant.  Discharge instructions were completed using the [employer] system. I would refer you to those at  this time. All warnings prescriptions follow-up etc. were discussed in detail with the patient. Patient indicates understanding and is agreeable with this plan. All questions answered.  Patient is made aware that they may return to the emergency department for any worsening or new condition or for any other emergency.   Risk: 5 This patient has a high risk of morbidity due to further diagnostic testing or treatment. Rationale: This patient's evaluation and management involve a high risk of morbidity due to the potential severity of presenting symptoms, need for diagnostic testing, and/or initiation of treatment that may require close monitoring. The differential includes conditions with potential for significant deterioration or requiring escalation of care. Treatment decisions in the ED, including medication administration, procedural interventions, or disposition planning, reflect this level of risk. Additional Support: -- Drug therapy requiring intensive monitoring for toxicity [ ]  -- Decision regarding elective major surgery with idenitified patient or procedure risk factors [ ]  -- Decision regarding hospitalization or escalation of hospital-level care [ ]  -- Decision not to resuscitate or to de-escalate care because of poor prognosis [ ]  -- Parental controlled substances [  ]  COPA: 5 The patient has a severe exacerbation, progression, or side effect of treatment of the following illness/illnesses: []  OR  The patient has the following acute or chronic illness/injury that poses a possible threat to life or bodily function: [X] : The patient has a potentially serious acute condition or an acute exacerbation of a chronic illness requiring urgent evaluation and management in the Emergency Department. The clinical presentation necessitates immediate consideration of life-threatening or function-threatening diagnoses, even if they are ultimately ruled out.  Data(2/3 categories following were performed): 5 I reviewed or ordered at least three unique tests, external notes, and/or the history required an independent historian as one of the three requirements as following: CMP, CBC, repeat potassium, hospital discharge summary from June of this show AND  I independently interpreted the following test: CT abdomen pelvis with IV contrast OR  I discussed the management of the patient with the following external physician or qualified healthcare provider: []     Suggested E/M Coding Level: 5, 99285, This has been selected based on the 2021/11/02 CPT guidelines for E/M codes in the Emergency Department based on 2/3 of the CoPA, Data, and Risk.   FINAL CLINICAL IMPRESSION(S) / ED DIAGNOSES   Final diagnoses:  Epigastric abdominal pain  Hiccups  Hypokalemia     Rx / DC Orders   ED Discharge Orders          Ordered    polyethylene glycol (MIRALAX ) 17 g packet  Daily        10/05/23 1430    sucralfate  (CARAFATE ) 1 g tablet  3 times daily with meals & bedtime        10/05/23 1430    famotidine  (PEPCID ) 20 MG tablet  Daily        10/05/23 1430    magnesium  hydroxide (MILK OF MAGNESIA) 400 MG/5ML suspension  Daily PRN        10/05/23 1430    potassium chloride  SA (KLOR-CON  M) 20 MEQ tablet  Daily        10/05/23 1433             Note:  This document was prepared  using Dragon voice recognition software and may include unintentional dictation errors.   Nicholaus Rolland BRAVO, MD 10/05/23 [phone]

## 2023-11-20 ENCOUNTER — Emergency Department
Admission: EM | Admit: 2023-11-20 | Discharge: 2023-11-20 | Disposition: A | Discharge status: Home / Self Care | Attending: Emergency Medicine | Admitting: Emergency Medicine

## 2023-11-20 ENCOUNTER — Other Ambulatory Visit: Payer: Self-pay

## 2023-11-20 DIAGNOSIS — E119 Type 2 diabetes mellitus without complications: Secondary | ICD-10-CM | POA: Diagnosis not present

## 2023-11-20 DIAGNOSIS — E876 Hypokalemia: Secondary | ICD-10-CM | POA: Diagnosis not present

## 2023-11-20 DIAGNOSIS — E860 Dehydration: Secondary | ICD-10-CM | POA: Diagnosis not present

## 2023-11-20 DIAGNOSIS — I10 Essential (primary) hypertension: Secondary | ICD-10-CM | POA: Diagnosis not present

## 2023-11-20 DIAGNOSIS — M62838 Other muscle spasm: Secondary | ICD-10-CM | POA: Diagnosis present

## 2023-11-20 LAB — COMPREHENSIVE METABOLIC PANEL WITH GFR
ALT: 22 U/L (ref 0–44)
AST: 20 U/L (ref 15–41)
Albumin: 3.6 g/dL (ref 3.5–5.0)
Alkaline Phosphatase: 50 U/L (ref 38–126)
Anion gap: 10 (ref 5–15)
BUN: 16 mg/dL (ref 6–20)
CO2: 26 mmol/L (ref 22–32)
Calcium: 9.3 mg/dL (ref 8.9–10.3)
Chloride: 103 mmol/L (ref 98–111)
Creatinine, Ser: 0.79 mg/dL (ref 0.44–1.00)
GFR, Estimated: >60 mL/min (ref >60)
Glucose, Bld: 139 mg/dL — ABNORMAL HIGH (ref 70–99)
Potassium: 2.7 mmol/L — CL (ref 3.5–5.1)
Sodium: 139 mmol/L (ref 135–145)
Total Bilirubin: 0.8 mg/dL (ref 0.0–1.2)
Total Protein: 6.5 g/dL (ref 6.5–8.1)

## 2023-11-20 LAB — MAGNESIUM: Magnesium: 1.8 mg/dL (ref 1.7–2.4)

## 2023-11-20 LAB — CBC
HCT: 40.6 % (ref 36.0–46.0)
Hemoglobin: 13.2 g/dL (ref 12.0–15.0)
MCH: 26.1 pg (ref 26.0–34.0)
MCHC: 32.5 g/dL (ref 30.0–36.0)
MCV: 80.4 fL (ref 80.0–100.0)
Platelets: 263 K/uL (ref 150–400)
RBC: 5.05 MIL/uL (ref 3.87–5.11)
RDW: 13.4 % (ref 11.5–15.5)
WBC: 7 K/uL (ref 4.0–10.5)
nRBC: 0 % (ref 0.0–0.2)

## 2023-11-20 MED ORDER — LACTATED RINGERS IV BOLUS
1000.0000 mL | Freq: Once | INTRAVENOUS | Status: AC
  Administered 2023-11-20: 1000 mL via INTRAVENOUS

## 2023-11-20 MED ORDER — POTASSIUM CHLORIDE CRYS ER 20 MEQ PO TBCR
40.0000 meq | EXTENDED_RELEASE_TABLET | Freq: Once | ORAL | Status: AC
  Administered 2023-11-20: 40 meq via ORAL
  Filled 2023-11-20: qty 2

## 2023-11-20 MED ORDER — CYCLOBENZAPRINE HCL 5 MG PO TABS: 5.0000 mg | ORAL_TABLET | Freq: Three times a day (TID) | ORAL | 0 refills | Status: AC | PRN

## 2023-11-20 MED ORDER — CYCLOBENZAPRINE HCL 10 MG PO TABS
5.0000 mg | ORAL_TABLET | Freq: Once | ORAL | Status: AC
  Administered 2023-11-20: 5 mg via ORAL
  Filled 2023-11-20: qty 1

## 2023-11-20 MED ORDER — POTASSIUM CHLORIDE 10 MEQ/100ML IV SOLN
10.0000 meq | Freq: Once | INTRAVENOUS | Status: AC
  Administered 2023-11-20: 10 meq via INTRAVENOUS
  Filled 2023-11-20: qty 100

## 2023-11-20 MED ORDER — POTASSIUM CHLORIDE CRYS ER 20 MEQ PO TBCR
20.0000 meq | EXTENDED_RELEASE_TABLET | Freq: Two times a day (BID) | ORAL | 0 refills | Status: DC
Start: 2023-11-20 — End: 2023-12-18

## 2023-11-20 NOTE — ED Provider Notes (Signed)
 [facility] Provider Note    Event Date/Time   First MD Initiated Contact with Patient 11/20/23 1256     (approximate)  History   Chief Complaint: Spasms  HPI  Michele [name] is a 58 y.o. female with a past medical history of diabetes, hypertension, hyperlipidemia, presents to the emergency department for muscle spasms.  According to the patient for the past few days she has been experiencing increased spasms and cramps in her muscles.  Patient states it is her muscles from her shoulders down to her toes.  Denies any specific area that is affected more.  Denies any chest pain or shortness of breath.  Patient states this has happened in the past sometimes when her potassium gets low.  Patient also states she could be dehydrated.  Physical Exam   Triage Vital Signs: ED Triage Vitals  Encounter Vitals Group     BP 11/20/23 1238 130/83     Girls Systolic BP Percentile --      Girls Diastolic BP Percentile --      Boys Systolic BP Percentile --      Boys Diastolic BP Percentile --      Pulse Rate 11/20/23 1206 86     Resp 11/20/23 1206 18     Temp 11/20/23 1206 97.8 F (36.6 C)     Temp Source 11/20/23 1206 Oral     SpO2 11/20/23 1206 98 %     Weight --      Height --      Head Circumference --      Peak Flow --      Pain Score 11/20/23 1208 5     Pain Loc --      Pain Education --      Exclude from Growth Chart --     Most recent vital signs: Vitals:   11/20/23 1206 11/20/23 1238  BP:  130/83  Pulse: 86 86  Resp: 18 18  Temp: 97.8 F (36.6 C) 97.8 F (36.6 C)  SpO2: 98% 98%    General: Awake, no distress.  CV:  Good peripheral perfusion.  Regular rate and rhythm  Resp:  Normal effort.  Equal breath sounds bilaterally.  Abd:  No distention.  Soft, nontender.  No rebound or guarding.  ED Results / Procedures / Treatments   MEDICATIONS ORDERED IN ED: Medications - No data to display   IMPRESSION / MDM / ASSESSMENT AND PLAN / ED  COURSE  I reviewed the triage vital signs and the nursing notes.  Patient's presentation is most consistent with acute illness / injury with system symptoms.  Patient presents emergency department for muscle spasms and cramps ongoing over the last few days.  History of the same in the past patient states at times this was due to potassium level being low.  Patient has no chest pain or shortness of breath reassuring physical exam reassuring vital signs.  Will check labs and continue to closely monitor.  Patient agreeable to plan of care.  Patient's lab work has resulted showing hypokalemia possibly because of her spasms.  Patient has now received oral as well as IV potassium as well as IV fluids.  Magnesium  is normal.  Patient states she has had similar episodes in the past.  Discussed with the patient a trial of potassium supplements at home to be taken twice daily have the patient follow-up with her doctor next week to have her potassium level rechecked to see if she needs ongoing  supplements.  Patient agreeable to plan of care.  FINAL CLINICAL IMPRESSION(S) / ED DIAGNOSES   Muscle spasm Hypokalemia  Note:  This document was prepared using Dragon voice recognition software and may include unintentional dictation errors.   Dorothyann Drivers, MD 11/20/23 1410

## 2023-11-20 NOTE — Discharge Instructions (Signed)
 Please take your potassium supplements as prescribed.  Please drink plenty of fluids.  Please follow-up with your doctor within the next 1 week for recheck of your labs including your potassium level as you may need to be on prolonged supplementation.  Return to the emergency department for any symptom concerning to yourself.

## 2023-11-20 NOTE — ED Triage Notes (Addendum)
 Pt to ED via POV from home. Pt reports muscle spasms all over the last few days and has been intermittent. Pt reports hx of sciatic. Pt reports is on weight loss shot

## 2023-12-17 ENCOUNTER — Emergency Department

## 2023-12-17 ENCOUNTER — Emergency Department
Admission: EM | Admit: 2023-12-17 | Discharge: 2023-12-18 | Disposition: A | Discharge status: Home / Self Care | Attending: Emergency Medicine | Admitting: Emergency Medicine

## 2023-12-17 ENCOUNTER — Other Ambulatory Visit: Payer: Self-pay

## 2023-12-17 ENCOUNTER — Encounter: Payer: Self-pay | Admitting: Emergency Medicine

## 2023-12-17 DIAGNOSIS — E876 Hypokalemia: Secondary | ICD-10-CM | POA: Diagnosis not present

## 2023-12-17 DIAGNOSIS — M25511 Pain in right shoulder: Secondary | ICD-10-CM | POA: Diagnosis present

## 2023-12-17 DIAGNOSIS — M898X1 Other specified disorders of bone, shoulder: Secondary | ICD-10-CM

## 2023-12-17 LAB — CBC
HCT: 39.2 % (ref 36.0–46.0)
Hemoglobin: 12.7 g/dL (ref 12.0–15.0)
MCH: 26 pg (ref 26.0–34.0)
MCHC: 32.4 g/dL (ref 30.0–36.0)
MCV: 80.3 fL (ref 80.0–100.0)
Platelets: 278 K/uL (ref 150–400)
RBC: 4.88 MIL/uL (ref 3.87–5.11)
RDW: 13.4 % (ref 11.5–15.5)
WBC: 9.2 K/uL (ref 4.0–10.5)
nRBC: 0 % (ref 0.0–0.2)

## 2023-12-17 LAB — BASIC METABOLIC PANEL WITH GFR
Anion gap: 14 (ref 5–15)
BUN: 19 mg/dL (ref 6–20)
CO2: 26 mmol/L (ref 22–32)
Calcium: 9.8 mg/dL (ref 8.9–10.3)
Chloride: 99 mmol/L (ref 98–111)
Creatinine, Ser: 1.11 mg/dL — ABNORMAL HIGH (ref 0.44–1.00)
GFR, Estimated: 59 mL/min — ABNORMAL LOW (ref >60)
Glucose, Bld: 109 mg/dL — ABNORMAL HIGH (ref 70–99)
Potassium: 2.7 mmol/L — CL (ref 3.5–5.1)
Sodium: 139 mmol/L (ref 135–145)

## 2023-12-17 LAB — TROPONIN I (HIGH SENSITIVITY)
Troponin I (High Sensitivity): 3 ng/L (ref <18)
Troponin I (High Sensitivity): 2 ng/L (ref <18)

## 2023-12-17 NOTE — ED Triage Notes (Signed)
 Pt arrives POV, ambulatory to triage, gait steady, no acute distress noted c/o sharp cp that radiates to her back w/ SOB x 5 hours; denies dizziness, n/v. Pt reports n/v yesterday but not today.

## 2023-12-18 MED ORDER — LIDOCAINE 5 % EX PTCH: 1.0000 | MEDICATED_PATCH | Freq: Two times a day (BID) | CUTANEOUS | 0 refills | Status: AC

## 2023-12-18 MED ORDER — POTASSIUM CHLORIDE CRYS ER 20 MEQ PO TBCR: 20.0000 meq | EXTENDED_RELEASE_TABLET | Freq: Every day | ORAL | 0 refills | Status: DC

## 2023-12-18 MED ORDER — LIDOCAINE 5 % EX PTCH
1.0000 | MEDICATED_PATCH | CUTANEOUS | Status: DC
  Administered 2023-12-18: 1 via TRANSDERMAL
  Filled 2023-12-18: qty 1

## 2023-12-18 MED ORDER — KETOROLAC TROMETHAMINE 30 MG/ML IJ SOLN
30.0000 mg | Freq: Once | INTRAMUSCULAR | Status: AC
  Administered 2023-12-18: 30 mg via INTRAMUSCULAR
  Filled 2023-12-18: qty 1

## 2023-12-18 MED ORDER — POTASSIUM CHLORIDE CRYS ER 20 MEQ PO TBCR
40.0000 meq | EXTENDED_RELEASE_TABLET | Freq: Once | ORAL | Status: AC
  Administered 2023-12-18: 40 meq via ORAL
  Filled 2023-12-18: qty 2

## 2023-12-18 NOTE — Discharge Instructions (Addendum)
 As we discussed, your evaluation was reassuring.  We discussed additional testing including a D-dimer lab test and possibly a CTA chest to rule out that you have a blood clot in your lungs that is causing the pain in your scapula, but you declined this testing.  We agree it is most likely musculoskeletal.  Please use the medications as prescribed as well as acetaminophen  (Tylenol ) 1000 mg every 6 hours.  You can also take ibuprofen  600 mg 3 times a day with meals.  Please follow-up with your regular doctor today as scheduled for a follow-up appointment.  Return to the emergency department if you develop new or worsening symptoms that concern you.

## 2023-12-18 NOTE — ED Provider Notes (Signed)
 [facility] Provider Note    Event Date/Time   First MD Initiated Contact with Patient 12/18/23 0106     (approximate)   History   Chest Pain and Shortness of Breath   HPI Michele [name] is a 58 y.o. female who presents for evaluation of pain in her right scapular region as well as some associated shortness of breath and chest pain.  Mostly it is the right side of her back that is hurting.  She cannot think of a particular injury or accident that happened.  She said it hurts when she moves her shoulder around, turns her side-to-side, or even takes a deep breath.  She has never had blood clots in her legs or her lungs, has no history of heart issues, has had no recent surgeries or immobilizations, no unilateral leg pain or swelling, no recent long trips, no use of birth control pills or estrogen supplements.  No numbness or tingling in her extremities.  It mostly hurts when she moves around and stretches or extends her arm and back.  No nausea or vomiting today although she had some nausea and 1 episode of vomiting yesterday.     Physical Exam   Triage Vital Signs: ED Triage Vitals  Encounter Vitals Group     BP 12/17/23 1943 136/84     Girls Systolic BP Percentile --      Girls Diastolic BP Percentile --      Boys Systolic BP Percentile --      Boys Diastolic BP Percentile --      Pulse Rate 12/17/23 1943 78     Resp 12/17/23 1943 18     Temp 12/17/23 1943 98.4 F (36.9 C)     Temp Source 12/17/23 1943 Oral     SpO2 12/17/23 1943 97 %     Weight --      Height 12/17/23 1944 1.676 m (5' 6)     Head Circumference --      Peak Flow --      Pain Score 12/17/23 1944 10     Pain Loc --      Pain Education --      Exclude from Growth Chart --     Most recent vital signs: Vitals:   12/17/23 1943 12/17/23 2229  BP: 136/84 (!) 131/90  Pulse: 78 68  Resp: 18 18  Temp: 98.4 F (36.9 C) 98.4 F (36.9 C)  SpO2: 97% 99%    General:  Sleeping  soundly, awakened easily to my voice. CV:  Good peripheral perfusion.  Normal heart sounds, regular rate and rhythm. Resp:  Normal effort. Speaking easily and comfortably, no accessory muscle usage nor intercostal retractions.  Lungs clear to auscultation bilaterally.  Some pain in her right scapular region with deep inspiration. Abd:  No distention.  No tenderness to palpation of the abdomen. Other:  Highly reproducible pain in her right scapula and right shoulder with palpation as well as with range of motion of the right arm, deep breaths, and moving from side-to-side.  No peripheral edema in her legs.   ED Results / Procedures / Treatments   Labs (all labs ordered are listed, but only abnormal results are displayed) Labs Reviewed  BASIC METABOLIC PANEL WITH GFR - Abnormal; Notable for the following components:      Result Value   Potassium 2.7 (*)    Glucose, Bld 109 (*)    Creatinine, Ser 1.11 (*)    GFR, Estimated  59 (*)    All other components within normal limits  CBC  TROPONIN I (HIGH SENSITIVITY)  TROPONIN I (HIGH SENSITIVITY)     EKG  ED ECG REPORT I, Darleene Dome, the attending physician, personally viewed and interpreted this ECG.  Date: 12/17/2023 EKG Time: 19: 39 Rate: 76 Rhythm: normal sinus rhythm QRS Axis: normal Intervals: normal ST/T Wave abnormalities: normal Narrative Interpretation: no evidence of acute ischemia    RADIOLOGY I personally viewed and interpreted the patient's two-view chest x-ray.  I see no evidence of pneumonia or pneumothorax or widened mediastinum.  I also read the radiologist's report, which confirmed no acute findings.  PROCEDURES:  Critical Care performed: No  Procedures    IMPRESSION / MDM / ASSESSMENT AND PLAN / ED COURSE  I reviewed the triage vital signs and the nursing notes.                              Differential diagnosis includes, but is not limited to, musculoskeletal strain, ACS, AAS, PE, pneumonia,  pneumothorax, referred pain from biliary colic.  Patient's presentation is most consistent with acute presentation with potential threat to life or bodily function.  Labs/studies ordered: EKG, chest x-ray, CBC, high-sensitivity troponin x 2, BMP  Interventions/Medications given:  Medications  lidocaine  (LIDODERM ) 5 % 1 patch (1 patch Transdermal Patch Applied 12/18/23 0122)  potassium chloride  SA (KLOR-CON  M) CR tablet 40 mEq (40 mEq Oral Given 12/18/23 0120)  ketorolac  (TORADOL ) 30 MG/ML injection 30 mg (30 mg Intramuscular Given 12/18/23 0130)    (Note:  hospital course my include additional interventions and/or labs/studies not listed above.)   Vital signs are essentially normal other than some mild diastolic hypertension.  Labs are notable for a potassium of 2.7 which she had on her last visit 1 month ago.  She was given a potassium supplement and told to follow-up as an outpatient but she says she has not done so yet.  However she has an appointment later today with her PCP.  Her hypokalemia is stable and I ordered 40 mill equivalents by mouth and will order her a prescription as listed below.  Her creatinine is a little bit higher than it was the last time but in line with what it has been in the past and she has not had significant output such as persistent vomiting or diarrhea.    Her Wells score for PE is 0 and she is at low risk, but that could explain the pain.  However the pain is highly reproducible with palpation of her right scapular region with no visible or palpable deformities.  I talked with her about this and I recommended a D-dimer to try to rule out pulmonary embolism with plans to proceed to a CTA chest if the dimer is elevated beyond the age-adjusted normal limit.  However, she would prefer not to do this and would prefer to just treat the musculoskeletal pain.  I did so as documented above with the medications and she will talk with her primary care physician about her  symptoms when she sees him later today.  I gave my usual customary follow-up recommendations and return precautions         FINAL CLINICAL IMPRESSION(S) / ED DIAGNOSES   Final diagnoses:  Pain of right scapula  Hypokalemia     Rx / DC Orders   ED Discharge Orders          Ordered  lidocaine  (LIDODERM ) 5 %  Every 12 hours        12/18/23 0221    potassium chloride  SA (KLOR-CON  M20) 20 MEQ tablet  Daily        12/18/23 0221             Note:  This document was prepared using Dragon voice recognition software and may include unintentional dictation errors.   Gordan Huxley, MD 12/18/23 GARLON

## 2023-12-29 ENCOUNTER — Emergency Department

## 2023-12-29 ENCOUNTER — Emergency Department
Admission: EM | Admit: 2023-12-29 | Discharge: 2023-12-29 | Disposition: A | Discharge status: Home / Self Care | Attending: Emergency Medicine | Admitting: Emergency Medicine

## 2023-12-29 DIAGNOSIS — R079 Chest pain, unspecified: Secondary | ICD-10-CM | POA: Insufficient documentation

## 2023-12-29 DIAGNOSIS — E876 Hypokalemia: Secondary | ICD-10-CM | POA: Insufficient documentation

## 2023-12-29 LAB — BASIC METABOLIC PANEL WITH GFR
Anion gap: 16 — ABNORMAL HIGH (ref 5–15)
BUN: 15 mg/dL (ref 6–20)
CO2: 25 mmol/L (ref 22–32)
Calcium: 9.7 mg/dL (ref 8.9–10.3)
Chloride: 102 mmol/L (ref 98–111)
Creatinine, Ser: 0.9 mg/dL (ref 0.44–1.00)
GFR, Estimated: >60 mL/min (ref >60)
Glucose, Bld: 116 mg/dL — ABNORMAL HIGH (ref 70–99)
Potassium: 3.1 mmol/L — ABNORMAL LOW (ref 3.5–5.1)
Sodium: 143 mmol/L (ref 135–145)

## 2023-12-29 LAB — MAGNESIUM: Magnesium: 1.8 mg/dL (ref 1.7–2.4)

## 2023-12-29 LAB — RESP PANEL BY RT-PCR (RSV, FLU A&B, COVID)  RVPGX2
Influenza A by PCR: NEGATIVE
Influenza B by PCR: NEGATIVE
Resp Syncytial Virus by PCR: NEGATIVE
SARS Coronavirus 2 by RT PCR: NEGATIVE

## 2023-12-29 LAB — CBC
HCT: 41.2 % (ref 36.0–46.0)
Hemoglobin: 13.6 g/dL (ref 12.0–15.0)
MCH: 26.4 pg (ref 26.0–34.0)
MCHC: 33 g/dL (ref 30.0–36.0)
MCV: 79.8 fL — ABNORMAL LOW (ref 80.0–100.0)
Platelets: 295 K/uL (ref 150–400)
RBC: 5.16 MIL/uL — ABNORMAL HIGH (ref 3.87–5.11)
RDW: 13.2 % (ref 11.5–15.5)
WBC: 6.6 K/uL (ref 4.0–10.5)
nRBC: 0 % (ref 0.0–0.2)

## 2023-12-29 LAB — TROPONIN I (HIGH SENSITIVITY)
Troponin I (High Sensitivity): 3 ng/L (ref <18)
Troponin I (High Sensitivity): <2 ng/L (ref <18)

## 2023-12-29 MED ORDER — ALUM & MAG HYDROXIDE-SIMETH 200-200-20 MG/5ML PO SUSP
30.0000 mL | Freq: Once | ORAL | Status: AC
  Administered 2023-12-29: 30 mL via ORAL
  Filled 2023-12-29: qty 30

## 2023-12-29 MED ORDER — KETOROLAC TROMETHAMINE 15 MG/ML IJ SOLN
15.0000 mg | Freq: Once | INTRAMUSCULAR | Status: AC
  Administered 2023-12-29: 15 mg via INTRAVENOUS
  Filled 2023-12-29: qty 1

## 2023-12-29 MED ORDER — POTASSIUM CHLORIDE CRYS ER 20 MEQ PO TBCR
40.0000 meq | EXTENDED_RELEASE_TABLET | Freq: Once | ORAL | Status: DC
  Filled 2023-12-29: qty 2

## 2023-12-29 MED ORDER — POTASSIUM CHLORIDE 20 MEQ PO PACK
40.0000 meq | PACK | Freq: Two times a day (BID) | ORAL | Status: DC
  Administered 2023-12-29: 40 meq via ORAL
  Filled 2023-12-29: qty 2

## 2023-12-29 MED ORDER — SODIUM CHLORIDE 0.9 % IV BOLUS
1000.0000 mL | Freq: Once | INTRAVENOUS | Status: AC
  Administered 2023-12-29: 1000 mL via INTRAVENOUS

## 2023-12-29 MED ORDER — LIDOCAINE VISCOUS HCL 2 % MT SOLN
15.0000 mL | Freq: Once | OROMUCOSAL | Status: AC
  Administered 2023-12-29: 15 mL via ORAL
  Filled 2023-12-29: qty 15

## 2023-12-29 MED ORDER — POTASSIUM CHLORIDE 20 MEQ PO PACK: 40.0000 meq | PACK | Freq: Two times a day (BID) | ORAL | Status: DC

## 2023-12-29 NOTE — ED Triage Notes (Signed)
 Pt presents to the ED via POV from home. States that her boyfriend dropped her off. Pt reports chest pain, dizziness, pain in extremities, headache, and hand cramping x2 days. Pt reports that the pain radiates into the middle of her back

## 2023-12-29 NOTE — ED Provider Notes (Signed)
 [facility] Provider Note    Event Date/Time   First MD Initiated Contact with Patient 12/29/23 1514     (approximate)   History   Chest Pain   HPI  Michele [name] is a 58 y.o. female  who presents to the emergency department today with complaints of continued chest pain, as well as new arm, hand and leg pain. She says she has had chest pain for a long time. Has been seen for it in the past without any clear etiology being identified. Over the past 2 days she has developed cramping and pain to her hands, arms and legs on both sides. Additionally she complains of frequent hiccups and headache. She does have history of hypokalemia and states she has been taking her pills as prescribed. Denies any fevers or chills.        Physical Exam   Triage Vital Signs: ED Triage Vitals [12/29/23 1439]  Encounter Vitals Group     BP (!) 126/94     Girls Systolic BP Percentile      Girls Diastolic BP Percentile      Boys Systolic BP Percentile      Boys Diastolic BP Percentile      Pulse Rate 81     Resp 18     Temp 98.6 F (37 C)     Temp Source Oral     SpO2 99 %     Weight 197 lb (89.4 kg)     Height 5' 6 (1.676 m)     Head Circumference      Peak Flow      Pain Score 10     Pain Loc      Pain Education      Exclude from Growth Chart     Most recent vital signs: Vitals:   12/29/23 1439  BP: (!) 126/94  Pulse: 81  Resp: 18  Temp: 98.6 F (37 C)  SpO2: 99%   General: Awake, alert, oriented. CV:  Good peripheral perfusion. Regular rate and rhythm. Resp:  Normal effort. Lungs clear. Abd:  No distention. Non tender.   ED Results / Procedures / Treatments   Labs (all labs ordered are listed, but only abnormal results are displayed) Labs Reviewed  BASIC METABOLIC PANEL WITH GFR - Abnormal; Notable for the following components:      Result Value   Potassium 3.1 (*)    Glucose, Bld 116 (*)    Anion gap 16 (*)    All other components within  normal limits  CBC - Abnormal; Notable for the following components:   RBC 5.16 (*)    MCV 79.8 (*)    All other components within normal limits  RESP PANEL BY RT-PCR (RSV, FLU A&B, COVID)  RVPGX2  MAGNESIUM   TROPONIN I (HIGH SENSITIVITY)  TROPONIN I (HIGH SENSITIVITY)     EKG  I, Guadalupe Eagles, attending physician, personally viewed and interpreted this EKG  EKG Time: 1441 Rate: 76 Rhythm: normal sinus rhythm Axis: normal Intervals: qtc 434 QRS: narrow ST changes: no st elevation Impression: normal ekg    RADIOLOGY I independently interpreted and visualized the CXR. My interpretation: No pneumonia Radiology interpretation:  IMPRESSION:  No active cardiopulmonary disease.      PROCEDURES:  Critical Care performed: No    MEDICATIONS ORDERED IN ED: Medications - No data to display   IMPRESSION / MDM / ASSESSMENT AND PLAN / ED COURSE  I reviewed the triage vital signs and  the nursing notes.                              Differential diagnosis includes, but is not limited to, viral illness, anemia, pneumonia, electrolyte abnormality  Patient's presentation is most consistent with acute presentation with potential threat to life or bodily function.   Patient presented to the emergency department today with concerns for continued chest pain as well as extremity pain.  Also had complaints of headache and hiccups.  Patient's EKG and troponin without concerning abnormalities here.  Blood work does show hypokalemia.  Patient has history of the same.  Was given potassium here in the emergency department.  Did feel better after medication.  Workup here without concerning abnormality save for the hypokalemia.  Given clinical improvement and reassuring workup I think is reasonable for patient be discharged.  I did discuss with patient portance of following up with primary care.      FINAL CLINICAL IMPRESSION(S) / ED DIAGNOSES   Final diagnoses:  Nonspecific chest  pain  Hypokalemia     Note:  This document was prepared using Dragon voice recognition software and may include unintentional dictation errors.    Floy Roberts, MD 12/29/23 [phone]

## 2024-01-01 NOTE — Progress Notes (Signed)
 Chief Complaint:   Chief Complaint  Patient presents with  . same day     Subjective:   Michele [name] is a 58 y.o. female in today for ED follow-up.  She was seen in the ED for chest pain and hypokalemia.  Current Outpatient Medications  Medication Sig Dispense Refill  . amitriptyline  (ELAVIL ) 50 MG tablet TAKE 1 TABLET BY MOUTH AT BEDTIME 90 tablet 0  . atorvastatin  (LIPITOR) 20 MG tablet Take 1 tablet by mouth once daily 90 tablet 0  . butalbital -acetaminophen -caffeine  (FIORICET ) 50-325-40 mg tablet Take 1 tablet by mouth    . gabapentin  (NEURONTIN ) 300 MG capsule Take 1 capsule (300 mg total) by mouth 2 (two) times daily 90 capsule 3  . glipiZIDE (GLUCOTROL XL) 10 MG XL tablet Take 1 tablet (10 mg total) by mouth once daily 90 tablet 3  . insulin  GLARGINE (LANTUS  SOLOSTAR U-100 INSULIN ) pen injector (concentration 100 units/mL) Inject 22 Units subcutaneously at bedtime 15 mL 6  . lidocaine  (LIDODERM ) 5 % patch APPLY ONE PATCH TOPICALLY TO CLEAN, DRY SKIN. LEAVE ON FOR 12 HOURS THEN REMOVE. MUST WAIT AT LEAST 12 HOURS BEFORE APPLYING PATCH(ES) AGAIN.    . meclizine  (ANTIVERT ) 25 mg tablet Take 1 tablet (25 mg total) by mouth 3 (three) times daily as needed for Dizziness 90 tablet 3  . metFORMIN  (GLUCOPHAGE -XR) 500 MG XR tablet Take 2 tablets (1,000 mg total) by mouth daily with dinner 180 tablet 3  . semaglutide (OZEMPIC) 1 mg/dose (4 mg/3 mL) pen injector Inject 0.75 mLs (1 mg total) subcutaneously once a week 3 mL 3  . semaglutide (OZEMPIC) 2 mg/dose (8 mg/3 mL) pen injector INJECT 2MG  DOSE UNDER THE SKIN ONE TIME PER WEEK 3 mL 2  . topiramate  (TOPAMAX ) 100 MG tablet Take 1 tablet by mouth twice daily 180 tablet 0  . triamterene-hydroCHLOROthiazide (MAXZIDE-25) 37.5-25 mg tablet Take 1 tablet by mouth once daily 90 tablet 0  . verapamiL  (CALAN -SR) 180 MG SR tablet Take 1 tablet (180 mg total) by mouth once daily 90 tablet 3  . magnesium  oxide (MAG-OX) 400 mg (241.3 mg magnesium ) tablet  Take 1 tablet (400 mg total) by mouth once daily 30 tablet 3  . potassium chloride  20 mEq/15 mL oral solution Take 15 mLs (20 mEq total) by mouth once daily for 120 days 450 mL 3   No current facility-administered medications for this visit.    Allergies as of 01/01/2024  . (No Known Allergies)    Past Medical History:  Diagnosis Date  . Diabetes mellitus type 2, uncomplicated (CMS/HHS-HCC)   . Fever blister   . Hyperlipidemia   . Hypertension   . Migraines   . Vertigo     Past Surgical History:  Procedure Laterality Date  . KNEE ARTHROSCOPY Left 04/18/2007  . HYSTERECTOMY  04/04/2009   Partial  . OPEN REDUCTION TIBIA Right 06/23/2010   ORIF lateral malleolar fx, repair of disrupted tib-fib syndesmosis  . REMOVAL HARDWARE ANKLE FOOT/TOES  09/03/2011   Removal of lateral malleolar hardware  . CESAREAN SECTION  1989; 1993   x2  . cyst on ear    . shoulder surgery Right   . TONSILLECTOMY    . TUBAL LIGATION Bilateral      Family History  Problem Relation Name Age of Onset  . High blood pressure (Hypertension) Mother    . Diabetes type II Mother    . Stroke Mother    . High blood pressure (Hypertension) Brother 5 brothers   .  Migraines Brother 5 brothers   . Myocardial Infarction (Heart attack) Father      Social History:  reports that she has never smoked. Her smokeless tobacco use includes snuff. She reports that she does not currently use alcohol. She reports that she does not use drugs.  Results for orders placed or performed in visit on 11/15/23  Glucose Whole Blood  Result Value Ref Range   Glucose-Whole Blood 91 74 - 106 mg/dL   Narrative   Test performed on glucometer using whole blood.      ROS:  General: No fever, chills or recent illness. No change in weight Skin:   No skin lesions, growths, masses, rashes, pruritus  HEENT: No change in vision or hearing. No pain or difficulty with swallowing Respiratory: No cough or shortness of  breath CV:  Positive for chest pain Objective:   Body mass index is 29.39 kg/m.  BP 112/80   Pulse 73   Ht 175.3 cm (5' 9)   Wt 90.3 kg (199 lb)   LMP  (LMP Unknown)   SpO2 99%   BMI 29.39 kg/m   General: WD/WN female, in no acute distress Respiratory:clear to auscultation.  No dullness to percussion.  No use of accessory muscles. Cardiac:  Regular rate and rhythm without murmur, gallops, or rubs   Assessment/Plan:   Atypical chest pain  (primary encounter diagnosis) Hypokalemia  Assessment and Plan  1.  Chest pain.  Reviewed ER notes, lab results.  No acute cardiac issue.  However she does have significant risk factors.  Have made referral to cardiology for further workup. 2.  Hypokalemia.  Will continue the potassium daily.  Will also add magnesium  since her magnesium  is only 1.8.  Will have her come back for a lab to check potassium and magnesium  in 1 week.    Goals     .  Lose Weight      Patient states she wants to lose weight and exercise more.    . * Lose Weight (pt-stated)    . * Lose Weight (pt-stated)       Goals       Patient Stated   . * Lose Weight (pt-stated)      Other   .  Lose Weight      Patient states she wants to lose weight and exercise more.               NORLEEN ALM ROWER, MD  Portions of this note were created using dictation software and may contain typographical errors.  *Some images could not be shown.

## 2024-01-22 ENCOUNTER — Emergency Department
Admission: EM | Admit: 2024-01-22 | Discharge: 2024-01-23 | Disposition: A | Attending: Emergency Medicine | Admitting: Emergency Medicine

## 2024-01-22 ENCOUNTER — Emergency Department

## 2024-01-22 ENCOUNTER — Other Ambulatory Visit: Payer: Self-pay

## 2024-01-22 DIAGNOSIS — Z8673 Personal history of transient ischemic attack (TIA), and cerebral infarction without residual deficits: Secondary | ICD-10-CM | POA: Insufficient documentation

## 2024-01-22 DIAGNOSIS — E876 Hypokalemia: Secondary | ICD-10-CM | POA: Diagnosis not present

## 2024-01-22 DIAGNOSIS — E1140 Type 2 diabetes mellitus with diabetic neuropathy, unspecified: Secondary | ICD-10-CM | POA: Diagnosis not present

## 2024-01-22 DIAGNOSIS — R072 Precordial pain: Secondary | ICD-10-CM | POA: Diagnosis present

## 2024-01-22 DIAGNOSIS — R079 Chest pain, unspecified: Secondary | ICD-10-CM

## 2024-01-22 DIAGNOSIS — M940 Chondrocostal junction syndrome [Tietze]: Secondary | ICD-10-CM | POA: Insufficient documentation

## 2024-01-22 DIAGNOSIS — I10 Essential (primary) hypertension: Secondary | ICD-10-CM | POA: Diagnosis not present

## 2024-01-22 LAB — CBC
HCT: 39.8 % (ref 36.0–46.0)
Hemoglobin: 12.9 g/dL (ref 12.0–15.0)
MCH: 26 pg (ref 26.0–34.0)
MCHC: 32.4 g/dL (ref 30.0–36.0)
MCV: 80.2 fL (ref 80.0–100.0)
Platelets: 290 K/uL (ref 150–400)
RBC: 4.96 MIL/uL (ref 3.87–5.11)
RDW: 13.4 % (ref 11.5–15.5)
WBC: 8.3 K/uL (ref 4.0–10.5)
nRBC: 0 % (ref 0.0–0.2)

## 2024-01-22 LAB — BASIC METABOLIC PANEL WITH GFR
Anion gap: 16 — ABNORMAL HIGH (ref 5–15)
BUN: 15 mg/dL (ref 6–20)
CO2: 25 mmol/L (ref 22–32)
Calcium: 9.8 mg/dL (ref 8.9–10.3)
Chloride: 101 mmol/L (ref 98–111)
Creatinine, Ser: 1.04 mg/dL — ABNORMAL HIGH (ref 0.44–1.00)
GFR, Estimated: >60 mL/min (ref >60)
Glucose, Bld: 78 mg/dL (ref 70–99)
Potassium: 2.9 mmol/L — ABNORMAL LOW (ref 3.5–5.1)
Sodium: 142 mmol/L (ref 135–145)

## 2024-01-22 LAB — TROPONIN T, HIGH SENSITIVITY
Troponin T High Sensitivity: <15 ng/L (ref 0–19)
Troponin T High Sensitivity: <15 ng/L (ref 0–19)

## 2024-01-22 LAB — MAGNESIUM: Magnesium: 1.9 mg/dL (ref 1.7–2.4)

## 2024-01-22 NOTE — ED Triage Notes (Signed)
 Pt sts that I am here for chest pain,  It is just really bad. It is going to my back and arms RN asked pt if this was the same as last time and she said no, it just really bad. Pt was seen by EMS at her job and she refused transport and a friend drove her.

## 2024-01-22 NOTE — ED Notes (Signed)
 Pt came to the front asking about a wait time and saying that she has been out here since 4pm and we keep putting people in front of her. This tech explained that we are not allowed to give out a wait time and that she still has a couple people in front of her. Pt walked away mad.

## 2024-01-22 NOTE — ED Provider Triage Note (Signed)
 Emergency Medicine Provider Triage Evaluation Note  Michele [name] , a 58 y.o. female  was evaluated in triage.  Pt complains of chest pain, cough, chills that started today.  Denies abdominal pain, shortness of breath, vomiting.   Physical Exam  BP 121/89 (BP Location: Left Arm)   Pulse 75   Temp 98 F (36.7 C) (Oral)   Resp 17   Wt 89.4 kg   SpO2 100%   BMI 31.80 kg/m  Gen:   Awake, no distress   Resp:  Normal effort. CTAB. MSK:   Moves extremities without difficulty  Other:    Medical Decision Making  Medically screening exam initiated at 5:01 PM.  Appropriate orders placed.  Kiley Solimine was informed that the remainder of the evaluation will be completed by another provider, this initial triage assessment does not replace that evaluation, and the importance of remaining in the ED until their evaluation is complete.  Labs, resp panel, CXR, EKG ordered.   Sheron Richland, PA-C 01/22/24 1704

## 2024-01-23 MED ORDER — POTASSIUM CHLORIDE CRYS ER 20 MEQ PO TBCR
40.0000 meq | EXTENDED_RELEASE_TABLET | Freq: Once | ORAL | Status: AC
  Administered 2024-01-23: 40 meq via ORAL
  Filled 2024-01-23: qty 2

## 2024-01-23 MED ORDER — LIDOCAINE 5 % EX PTCH
1.0000 | MEDICATED_PATCH | CUTANEOUS | Status: DC
  Administered 2024-01-23: 1 via TRANSDERMAL
  Filled 2024-01-23: qty 1

## 2024-01-23 MED ORDER — ACETAMINOPHEN 500 MG PO TABS
1000.0000 mg | ORAL_TABLET | Freq: Once | ORAL | Status: AC
  Administered 2024-01-23: 1000 mg via ORAL
  Filled 2024-01-23: qty 2

## 2024-01-23 MED ORDER — KETOROLAC TROMETHAMINE 30 MG/ML IJ SOLN
30.0000 mg | Freq: Once | INTRAMUSCULAR | Status: AC
  Administered 2024-01-23: 30 mg via INTRAMUSCULAR
  Filled 2024-01-23: qty 1

## 2024-01-23 NOTE — ED Provider Notes (Signed)
 [facility] Provider Note    Event Date/Time   First MD Initiated Contact with Patient 01/23/24 0001     (approximate)   History   Chest Pain   HPI  Michele [name] is a 58 y.o. female   Past medical history of chronic pain, diabetic neuropathy, TIA, hypertension, hyperlipidemia, hypokalemia, diabetes type 2 presents to emergency department with ongoing 1 month + of substernal chest pain.  Worse with certain movements, worse with palpation, no skin changes, no respiratory symptoms and no trauma.  Was evaluated in the emergency department last month with unremarkable workup and follow-up with primary doctor who referred her to cardiologist, upcoming appointment has been scheduled.  Was found to be hypokalemic then.  No significant changes, ongoing pain, wraps around the chest substernal wrapping around to the back.  Denies abdominal pain, nausea vomiting or diarrhea.   External Medical Documents Reviewed: Prior emergency department note and outpatient follow-up note      Physical Exam   Triage Vital Signs: ED Triage Vitals [01/22/24 1656]  Encounter Vitals Group     BP 121/89     Girls Systolic BP Percentile      Girls Diastolic BP Percentile      Boys Systolic BP Percentile      Boys Diastolic BP Percentile      Pulse Rate 75     Resp 17     Temp 98 F (36.7 C)     Temp Source Oral     SpO2 100 %     Weight 197 lb (89.4 kg)     Height      Head Circumference      Peak Flow      Pain Score 10     Pain Loc      Pain Education      Exclude from Growth Chart     Most recent vital signs: Vitals:   01/22/24 1656 01/22/24 2112  BP: 121/89 (!) 144/81  Pulse: 75 72  Resp: 17 16  Temp: 98 F (36.7 C) 97.8 F (36.6 C)  SpO2: 100% 100%    General: Awake, no distress.  CV:  Good peripheral perfusion.  Resp:  Normal effort.  Abd:  No distention.  Other:  Point tenderness to the substernal area as well as the bilateral rib cage.   No skin changes to the affected area.  Soft benign nonperitoneal nonfocal abdominal exam.  Clear lungs without focality or wheezing.  No heart murmur.  Equal pulses in bilateral radial.   ED Results / Procedures / Treatments   Labs (all labs ordered are listed, but only abnormal results are displayed) Labs Reviewed  BASIC METABOLIC PANEL WITH GFR - Abnormal; Notable for the following components:      Result Value   Potassium 2.9 (*)    Creatinine, Ser 1.04 (*)    Anion gap 16 (*)    All other components within normal limits  CBC  MAGNESIUM   TROPONIN T, HIGH SENSITIVITY  TROPONIN T, HIGH SENSITIVITY     I ordered and reviewed the above labs they are notable for hypokalemia  EKG  ED ECG REPORT I, Ginnie Shams, the attending physician, personally viewed and interpreted this ECG.   Date: 01/23/2024  EKG Time: 1658  Rate: 70  Rhythm: sinus  Axis: nl  Intervals:nl  ST&T Change: no stemi    RADIOLOGY I independently reviewed and interpreted chest x-ray and see no obvious focality, pneumothorax, or mediastinal widening  I also reviewed radiologist's formal read.   PROCEDURES:  Critical Care performed: No  Procedures   MEDICATIONS ORDERED IN ED: Medications  potassium chloride  SA (KLOR-CON  M) CR tablet 40 mEq (has no administration in time range)  ketorolac  (TORADOL ) 30 MG/ML injection 30 mg (has no administration in time range)  acetaminophen  (TYLENOL ) tablet 1,000 mg (has no administration in time range)  lidocaine  (LIDODERM ) 5 % 1 patch (has no administration in time range)     IMPRESSION / MDM / ASSESSMENT AND PLAN / ED COURSE  I reviewed the triage vital signs and the nursing notes.                                Patient's presentation is most consistent with acute presentation with potential threat to life or bodily function.  Differential diagnosis includes, but is not limited to, ACS, PE, dissection, musculoskeletal pain, shingles, pneumothorax,  respiratory infection   The patient is on the cardiac monitor to evaluate for evidence of arrhythmia and/or significant heart rate changes.  MDM:    Subacute/chronic chest pain now with unremarkable workup as above including serial troponins, labs which show previously known hypokalemia, and a normal-appearing EKG and chest x-ray  Most likely costochondritis given the point tenderness worse with movement as well.  Considered PE but unlikely given no respiratory symptoms, low risk, and symptoms do not match with this pathology.  Considered dissection as well but highly unlikely, normal hemodynamics, well-controlled blood pressure, normal exam, and symptoms do not match with this etiology.  Will give Toradol , Lidoderm , oral potassium, and have her follow-up with cardiology as scheduled and also her PMD.  I considered hospitalization for admission or observation however given chronicity of symptoms, low clinical suspicion and workup unremarkable for cardiopulmonary emergencies, I think she can be managed as an outpatient follow-up with PMD/cardiology.  Return precautions given.        FINAL CLINICAL IMPRESSION(S) / ED DIAGNOSES   Final diagnoses:  Nonspecific chest pain  Costochondritis  Hypokalemia     Rx / DC Orders   ED Discharge Orders     None        Note:  This document was prepared using Dragon voice recognition software and may include unintentional dictation errors.    Cyrena Mylar, MD 01/23/24 BEATRIS

## 2024-01-23 NOTE — Discharge Instructions (Addendum)
 Fortunately your evaluation in the emergency department did not show any emergency conditions like heart attack to explain your pain.  It is important to follow-up with your heart doctor and your primary doctor, call them in the morning to ensure prompt follow-up appointment.  Your potassium was still on the low side today.  See the attached document for ways to increase your potassium intake through diet.  Have your doctor recheck this level at the next appointment.  I think that your condition is due to inflammation in the chest wall called costochondritis.  Take acetaminophen  650 mg and ibuprofen  400 mg every 6 hours for pain.  Take with food. Use the pain patches as prescribed on your chest wall and back.  If these medications are not working to control your pain, you can talk with your primary doctor for alternatives.  Thank you for choosing us  for your health care today!  Please see your primary doctor this week for a follow up appointment.   If you have any new, worsening, or unexpected symptoms call your doctor right away or come back to the emergency department for reevaluation.  It was my pleasure to care for you today.   Ginnie EDISON Cyrena, MD

## 2024-01-23 NOTE — ED Provider Notes (Incomplete)
 [facility] Provider Note    Event Date/Time   First MD Initiated Contact with Patient 01/23/24 0001     (approximate)   History   Chest Pain   HPI  Delainee Tramel is a 58 y.o. female   Past medical history of ***    Independent Historian contributed to assessment above: ***  External Medical Documents Reviewed: ***      Physical Exam   Triage Vital Signs: ED Triage Vitals [01/22/24 1656]  Encounter Vitals Group     BP 121/89     Girls Systolic BP Percentile      Girls Diastolic BP Percentile      Boys Systolic BP Percentile      Boys Diastolic BP Percentile      Pulse Rate 75     Resp 17     Temp 98 F (36.7 C)     Temp Source Oral     SpO2 100 %     Weight 197 lb (89.4 kg)     Height      Head Circumference      Peak Flow      Pain Score 10     Pain Loc      Pain Education      Exclude from Growth Chart     Most recent vital signs: Vitals:   01/22/24 1656 01/22/24 2112  BP: 121/89 (!) 144/81  Pulse: 75 72  Resp: 17 16  Temp: 98 F (36.7 C) 97.8 F (36.6 C)  SpO2: 100% 100%    General: Awake, no distress. *** CV:  Good peripheral perfusion. *** Resp:  Normal effort. *** Abd:  No distention. *** Other:  ***   ED Results / Procedures / Treatments   Labs (all labs ordered are listed, but only abnormal results are displayed) Labs Reviewed  BASIC METABOLIC PANEL WITH GFR - Abnormal; Notable for the following components:      Result Value   Potassium 2.9 (*)    Creatinine, Ser 1.04 (*)    Anion gap 16 (*)    All other components within normal limits  CBC  MAGNESIUM   TROPONIN T, HIGH SENSITIVITY  TROPONIN T, HIGH SENSITIVITY     I ordered and reviewed the above labs they are notable for ***  EKG  ED ECG REPORT I, Ginnie Shams, the attending physician, personally viewed and interpreted this ECG.   Date: 01/23/2024  EKG Time: ***  Rate: ***  Rhythm: {ekg findings:315101}  Axis: ***   Intervals:{conduction defects:17367}  ST&T Change: ***    RADIOLOGY I independently reviewed and interpreted *** I also reviewed radiologist's formal read.   PROCEDURES:  Critical Care performed: {CriticalCareYesNo:19197::Yes, see critical care procedure note(s),No}  Procedures   MEDICATIONS ORDERED IN ED: Medications - No data to display  External physician / consultants:  I spoke with *** regarding care plan for this patient.   IMPRESSION / MDM / ASSESSMENT AND PLAN / ED COURSE  I reviewed the triage vital signs and the nursing notes.                                Patient's presentation is most consistent with {EM COPA:27473}  Differential diagnosis includes, but is not limited to, ***   ***The patient is on the cardiac monitor to evaluate for evidence of arrhythmia and/or significant heart rate changes.  MDM:  ***  I considered hospitalization  for admission or observation ***        FINAL CLINICAL IMPRESSION(S) / ED DIAGNOSES   Final diagnoses:  None     Rx / DC Orders   ED Discharge Orders     None        Note:  This document was prepared using Dragon voice recognition software and may include unintentional dictation errors.

## 2024-02-07 ENCOUNTER — Other Ambulatory Visit: Payer: Self-pay | Admitting: Cardiovascular Disease

## 2024-02-07 DIAGNOSIS — I10 Essential (primary) hypertension: Secondary | ICD-10-CM

## 2024-02-07 DIAGNOSIS — E785 Hyperlipidemia, unspecified: Secondary | ICD-10-CM

## 2024-02-07 DIAGNOSIS — R072 Precordial pain: Secondary | ICD-10-CM

## 2024-02-07 DIAGNOSIS — E119 Type 2 diabetes mellitus without complications: Secondary | ICD-10-CM

## 2024-02-21 NOTE — Procedures (Signed)
 [facility] - Physiatry  PROCEDURE: 1. Bilateral L5-S1 transforaminal epidural injections under fluoroscopic guidance. 2. Use of fluoroscopic guidance was required to ensure adequate delivery of medication into the epidural space and for proper needle placement. 3. The patient was monitored with continuous pulse oximetry throughout the procedures.   DIAGNOSIS/INDICATION FOR PROCEDURE: The patient presents today at her request for bilateral lumbar epidural steroid injections.  She did have these injections on 11/15/2023 with 70% improvement for over 3 months. There were no problems or complications postinjection.  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   IMPRESSION/RESULTS: Patient tolerated the procedure well without any complications.  She will follow-up with me 2 weeks postinjection.  Of note her blood sugar was 87 which was taken right prior to the injection.    There was a 39 degree rotation.  PAIN: Preinjection pain score:9/10 Postinjection pain score: 0/10 Percent change: 100% improvement  INFORMED CONSENT: The patient understood the potential risks and benefits of the procedure, which were explained to the patient prior to the procedure.  The patient read and signed the consent stating complete understanding of the information, and wished to proceed with the procedure, there were no barriers to understanding.  Ample time was given for any questions to be answered prior to the procedure. The risks of the procedure were explained including, but not limited to the risk of bleeding and/or infection into the spinal area, intervertebral discs, or epidural space; nerve injury; nerve irritation; reaction to medications; etc.  The patient denied any history of bleeding disorders, allergies to medications used or other medical contraindications to the procedure.  No promises were given to any expected outcome.    The patient is a diabetic and was thoroughly  counseled with respect to the side effects of corticosteroids in relationship to blood sugars. The patient felt comfortable monitoring their blood sugars and it was recommended they do so, at least 3-4 times per day for the next 2-3 days. The patient felt comfortable adjusting their medications if needed. If the patient were to become very symptomatic, such as developing lightheadedness or dizziness, the patient was informed to check their blood sugar, and if needed contact the PCP or go the ER for evaluation.     PROCEDURE: A time out was performed by physician and assistant.  Correct patient, procedure, plan of care, site, position, and equipment were verified.  Identical procedural technique was used for both epidural injections.  The patient was sterilely prepped with a triple scrub of betadine solution and draped in the prone position.  Careful attention was paid to aseptic technique throughout the procedure.  Then, the Left L5-S1 neuroforamen was identified under fluoroscopic guidance.  The overlying skin and subcutaneous tissues were anesthetized with approximately 2-3 cc of 1% Xylocaine .  A 25 gauge 5 inch spinal needle was inserted down into the foramen.  Approximately 2 cc of Omnipaque -240 mgI/mL contrast was infiltrated under real time-fluoroscopy demonstrating satisfactory spread along the exiting spinal nerve and into the epidural space without vascular uptake.  No aspirate was noted.  Spot films were taken.  Then half a solution containing 1.0 cc of Celestone 6mg /cc and 2 cc of 1% preservative-free lidocaine  was slowly infiltrated around the spinal nerve and into the epidural space under real-time fluoroscopy.  There was good contrast washout.  The needle was removed.   Then, the Right L5-S1 neuroforamen was identified under fluoroscopic guidance.  The overlying skin and subcutaneous tissues were anesthetized with approximately 2-3 cc  of 1% Xylocaine .  A 25 gauge 5 inch spinal needle was inserted  down into the foramen.  Approximately 2 cc of Omnipaque -240 mgI/mL contrast was infiltrated under real time-fluoroscopy demonstrating satisfactory spread along the exiting spinal nerve and into the epidural space without vascular uptake.  No aspirate was noted.  Spot films were taken.  Then the other half of the solution containing 1.0 cc of Celestone 6mg /cc and 2 cc of 1% preservative-free lidocaine  was slowly infiltrated around the spinal nerve and into the epidural space under real-time fluoroscopy.  There was good contrast washout.  The needle was removed.  There were no complications.  0 cc's Celestone wasted on 02/21/2024 at 11:30 AM   DISCHARGE SUMMARY: The patient was monitored post-injection for 30 minutes in the recovery area and remained stable without evidence of complications.  The patient was discharged with discharge instructions in stable condition. Patient was instructed to contact us  with any problems.  Procedure fluoro time: 7.7 seconds. Fluoroscopy dose: 4.15 mGy.  This is below the dose threshold #1 (5000 mGy).    DELON GINS, MD

## 2024-03-10 ENCOUNTER — Encounter (HOSPITAL_COMMUNITY): Payer: Self-pay

## 2024-03-11 ENCOUNTER — Telehealth (HOSPITAL_COMMUNITY): Payer: Self-pay | Admitting: *Deleted

## 2024-03-11 NOTE — Telephone Encounter (Signed)
 Attempted to call patient regarding upcoming cardiac CT appointment. Left message on voicemail with name and callback number  Larey Brick RN Navigator Cardiac Imaging [facility] [phone] Office [phone] Cell

## 2024-03-12 ENCOUNTER — Ambulatory Visit: Admission: RE | Admit: 2024-03-12 | Source: Ambulatory Visit

## 2024-03-25 ENCOUNTER — Other Ambulatory Visit: Payer: Self-pay | Admitting: Physical Medicine & Rehabilitation

## 2024-03-25 DIAGNOSIS — G8929 Other chronic pain: Secondary | ICD-10-CM

## 2024-03-27 ENCOUNTER — Telehealth (HOSPITAL_COMMUNITY): Payer: Self-pay | Admitting: *Deleted

## 2024-03-27 NOTE — Telephone Encounter (Signed)
 Attempted to call patient regarding upcoming cardiac CT appointment. Left message on voicemail with name and callback number Sid Seats RN Navigator Cardiac Imaging [facility] V[facility] Services [phone] Office

## 2024-03-30 ENCOUNTER — Ambulatory Visit: Admission: RE | Admit: 2024-03-30 | Source: Ambulatory Visit

## 2024-04-04 ENCOUNTER — Inpatient Hospital Stay: Admission: RE | Admit: 2024-04-04 | Source: Ambulatory Visit

## 2024-04-08 ENCOUNTER — Emergency Department

## 2024-04-08 ENCOUNTER — Emergency Department
Admission: EM | Admit: 2024-04-08 | Discharge: 2024-04-08 | Disposition: A | Discharge status: Home / Self Care | Attending: Emergency Medicine | Admitting: Emergency Medicine

## 2024-04-08 ENCOUNTER — Other Ambulatory Visit: Payer: Self-pay

## 2024-04-08 DIAGNOSIS — R42 Dizziness and giddiness: Secondary | ICD-10-CM

## 2024-04-08 DIAGNOSIS — R079 Chest pain, unspecified: Secondary | ICD-10-CM | POA: Insufficient documentation

## 2024-04-08 DIAGNOSIS — B370 Candidal stomatitis: Secondary | ICD-10-CM | POA: Insufficient documentation

## 2024-04-08 DIAGNOSIS — G43001 Migraine without aura, not intractable, with status migrainosus: Secondary | ICD-10-CM | POA: Insufficient documentation

## 2024-04-08 DIAGNOSIS — E1140 Type 2 diabetes mellitus with diabetic neuropathy, unspecified: Secondary | ICD-10-CM | POA: Insufficient documentation

## 2024-04-08 DIAGNOSIS — I10 Essential (primary) hypertension: Secondary | ICD-10-CM | POA: Insufficient documentation

## 2024-04-08 LAB — CBC
HCT: 43.9 % (ref 36.0–46.0)
Hemoglobin: 14.4 g/dL (ref 12.0–15.0)
MCH: 26.5 pg (ref 26.0–34.0)
MCHC: 32.8 g/dL (ref 30.0–36.0)
MCV: 80.8 fL (ref 80.0–100.0)
Platelets: 293 10*3/uL (ref 150–400)
RBC: 5.43 MIL/uL — ABNORMAL HIGH (ref 3.87–5.11)
RDW: 13.3 % (ref 11.5–15.5)
WBC: 6.5 10*3/uL (ref 4.0–10.5)
nRBC: 0 % (ref 0.0–0.2)

## 2024-04-08 LAB — COMPREHENSIVE METABOLIC PANEL WITH GFR
ALT: 11 U/L (ref 0–44)
AST: 15 U/L (ref 15–41)
Albumin: 4.3 g/dL (ref 3.5–5.0)
Alkaline Phosphatase: 79 U/L (ref 38–126)
Anion gap: 14 (ref 5–15)
BUN: 21 mg/dL — ABNORMAL HIGH (ref 6–20)
CO2: 24 mmol/L (ref 22–32)
Calcium: 10 mg/dL (ref 8.9–10.3)
Chloride: 103 mmol/L (ref 98–111)
Creatinine, Ser: 0.9 mg/dL (ref 0.44–1.00)
GFR, Estimated: >60 mL/min
Glucose, Bld: 84 mg/dL (ref 70–99)
Potassium: 3.5 mmol/L (ref 3.5–5.1)
Sodium: 141 mmol/L (ref 135–145)
Total Bilirubin: 0.4 mg/dL (ref 0.0–1.2)
Total Protein: 7.1 g/dL (ref 6.5–8.1)

## 2024-04-08 LAB — TROPONIN T, HIGH SENSITIVITY: Troponin T High Sensitivity: <6 ng/L (ref 0–19)

## 2024-04-08 LAB — URINALYSIS, ROUTINE W REFLEX MICROSCOPIC
Bilirubin Urine: NEGATIVE
Glucose, UA: NEGATIVE mg/dL
Hgb urine dipstick: NEGATIVE
Ketones, ur: NEGATIVE mg/dL
Leukocytes,Ua: NEGATIVE
Nitrite: NEGATIVE
Protein, ur: NEGATIVE mg/dL
Specific Gravity, Urine: 1.027 (ref 1.005–1.030)
pH: 5 (ref 5.0–8.0)

## 2024-04-08 LAB — CK: Total CK: 25 U/L — ABNORMAL LOW (ref 38–234)

## 2024-04-08 LAB — RESP PANEL BY RT-PCR (RSV, FLU A&B, COVID)  RVPGX2
Influenza A by PCR: NEGATIVE
Influenza B by PCR: NEGATIVE
Resp Syncytial Virus by PCR: NEGATIVE
SARS Coronavirus 2 by RT PCR: NEGATIVE

## 2024-04-08 MED ORDER — KETOROLAC TROMETHAMINE 15 MG/ML IJ SOLN
15.0000 mg | Freq: Once | INTRAMUSCULAR | Status: AC
  Administered 2024-04-08: 15 mg via INTRAVENOUS
  Filled 2024-04-08: qty 1

## 2024-04-08 MED ORDER — SODIUM CHLORIDE 0.9 % IV BOLUS
1000.0000 mL | Freq: Once | INTRAVENOUS | Status: AC
  Administered 2024-04-08: 1000 mL via INTRAVENOUS

## 2024-04-08 MED ORDER — MECLIZINE HCL 25 MG PO TABS: 25.0000 mg | ORAL_TABLET | Freq: Three times a day (TID) | ORAL | 0 refills | Status: AC | PRN

## 2024-04-08 MED ORDER — FLUTICASONE PROPIONATE 50 MCG/ACT NA SUSP: 1.0000 | Freq: Every day | NASAL | 0 refills | Status: AC

## 2024-04-08 MED ORDER — MECLIZINE HCL 25 MG PO TABS
25.0000 mg | ORAL_TABLET | Freq: Once | ORAL | Status: AC
  Administered 2024-04-08: 25 mg via ORAL
  Filled 2024-04-08: qty 1

## 2024-04-08 MED ORDER — DEXAMETHASONE SOD PHOSPHATE PF 10 MG/ML IJ SOLN
10.0000 mg | Freq: Once | INTRAMUSCULAR | Status: AC
  Administered 2024-04-08: 10 mg via INTRAVENOUS
  Filled 2024-04-08: qty 1

## 2024-04-08 MED ORDER — NYSTATIN 100000 UNIT/ML MT SUSP: 5.0000 mL | Freq: Four times a day (QID) | OROMUCOSAL | 0 refills | Status: AC

## 2024-04-08 MED ORDER — DIPHENHYDRAMINE HCL 50 MG/ML IJ SOLN
50.0000 mg | Freq: Once | INTRAMUSCULAR | Status: AC
  Administered 2024-04-08: 50 mg via INTRAVENOUS
  Filled 2024-04-08: qty 1

## 2024-04-08 MED ORDER — ONDANSETRON 4 MG PO TBDP
4.0000 mg | ORAL_TABLET | Freq: Once | ORAL | Status: AC
  Administered 2024-04-08: 4 mg via ORAL
  Filled 2024-04-08: qty 1

## 2024-04-08 NOTE — Discharge Instructions (Addendum)
 You have been seen in the Emergency Department (ED) for a migraine.  Please use Tylenol  or Motrin  as needed for symptoms, but only as written on the box, and take any regular medications that have been prescribed for you. As we have discussed, please follow up with your doctor as soon as possible regarding todays ED visit and your headache symptoms.    Call your doctor or return to the Emergency Department (ED) if you have a worsening headache, sudden and severe headache, confusion, slurred speech, facial droop, weakness or numbness in any arm or leg, extreme fatigue, or other symptoms that concern you.   I have also sent some medicine for dizziness and sinus infection.

## 2024-04-08 NOTE — ED Provider Notes (Signed)
 "  [facility] Provider Note    Event Date/Time   First MD Initiated Contact with Patient 04/08/24 1628     (approximate)   History   Cough   HPI  Michele [name] is a 59 y.o. female  with a past medical history of chronic painful diabetic neuropathy, hypertension, type 2 diabetes, migraines presents to the emergency department with nausea, generalized weakness, headache, myalgias, dry cough lightheadedness and dizziness that started two days ago. She also endorses substernal dull chest pain that started last night. She denies sore throat, otalgia, SOB, abdominal pain, vomiting, back pain, vision changes, rhinorrhea. She denies sick contacts or recent travel. No recent viral URI.    Physical Exam   Triage Vital Signs: ED Triage Vitals  Encounter Vitals Group     BP 04/08/24 1352 (!) 129/96     Girls Systolic BP Percentile --      Girls Diastolic BP Percentile --      Boys Systolic BP Percentile --      Boys Diastolic BP Percentile --      Pulse Rate 04/08/24 1352 77     Resp 04/08/24 1352 16     Temp 04/08/24 1352 98 F (36.7 C)     Temp Source 04/08/24 1352 Oral     SpO2 04/08/24 1352 99 %     Weight 04/08/24 1353 197 lb 1.5 oz (89.4 kg)     Height --      Head Circumference --      Peak Flow --      Pain Score 04/08/24 1353 8     Pain Loc --      Pain Education --      Exclude from Growth Chart --     Most recent vital signs: Vitals:   04/08/24 1352 04/08/24 1803  BP: (!) 129/96 (!) 125/99  Pulse: 77 69  Resp: 16 19  Temp: 98 F (36.7 C) 98 F (36.7 C)  SpO2: 99% 100%    General: Awake, in no acute distress. Appears stated age. Head: Normocephalic, atraumatic. Eyes: PERRLA. EOMs intact.  Ears/Nose/Throat: Nares patent, no nasal discharge. Oropharynx moist, no erythema or exudate; tongue with scrapable white sore patches.  Neck: Supple, no lymphadenopathy, no nuchal rigidity. CV: Good peripheral perfusion. RRR, 77  bpm. Respiratory:Normal respiratory effort.  No respiratory distress. CTAB. GI: Soft, non-distended, non-tender.  MSK: Normal ROM and  5/5 strength in b/l upper and lower extremities.  Skin:Warm, dry, intact. No rashes, lesions, or ecchymosis. No cyanosis or pallor. Neurological: A&Ox4 to person, place, time, and situation. Cranial nerves III-XII grossly intact. Sensation intact and equal to b/l upper and lower extremities. Strength symmetric.  No TTP of sinuses.  ED Results / Procedures / Treatments   Labs (all labs ordered are listed, but only abnormal results are displayed) Labs Reviewed  CBC - Abnormal; Notable for the following components:      Result Value   RBC 5.43 (*)    All other components within normal limits  COMPREHENSIVE METABOLIC PANEL WITH GFR - Abnormal; Notable for the following components:   BUN 21 (*)    All other components within normal limits  URINALYSIS, ROUTINE W REFLEX MICROSCOPIC - Abnormal; Notable for the following components:   Color, Urine YELLOW (*)    APPearance HAZY (*)    All other components within normal limits  CK - Abnormal; Notable for the following components:   Total CK 25 (*)    All  other components within normal limits  RESP PANEL BY RT-PCR (RSV, FLU A&B, COVID)  RVPGX2  CBG MONITORING, ED  TROPONIN T, HIGH SENSITIVITY     EKG  Rate:  73 bpm Rhythm: Regular Axis: Positive PR Interval: 180 ms QRS Complex: 74 ms ST Segment: isometric, no elevation or depression QTc interval: 423 ms T Waves: upright, symmetric No evidence of acute ischemic changes.   RADIOLOGY CXR FINDINGS: The cardiomediastinal contours are normal. The lungs are clear. Pulmonary vasculature is normal. No consolidation, pleural effusion, or pneumothorax. No acute osseous abnormalities are seen.   IMPRESSION: No active disease.  CT Head FINDINGS: Brain: No evidence of acute infarction, hemorrhage, hydrocephalus, extra-axial collection or mass  lesion/mass effect.   Vascular: No hyperdense vessel or unexpected calcification.   Skull: Normal. Negative for fracture or focal lesion.   Sinuses/Orbits: There is mild bilateral maxillary sinus and bilateral ethmoid sinus mucosal thickening.   Other: None.   IMPRESSION: 1. No acute intracranial abnormality. 2. Mild bilateral maxillary sinus and bilateral ethmoid sinus disease.   PROCEDURES:  Critical Care performed: No   Procedures   MEDICATIONS ORDERED IN ED: Medications  ondansetron  (ZOFRAN -ODT) disintegrating tablet 4 mg (4 mg Oral Given 04/08/24 1925)  sodium chloride  0.9 % bolus 1,000 mL (1,000 mLs Intravenous New Bag/Given 04/08/24 1925)  ketorolac  (TORADOL ) 15 MG/ML injection 15 mg (15 mg Intravenous Given 04/08/24 1925)  diphenhydrAMINE  (BENADRYL ) injection 50 mg (50 mg Intravenous Given 04/08/24 1924)  dexamethasone  (DECADRON ) injection 10 mg (10 mg Intravenous Given 04/08/24 2019)  meclizine  (ANTIVERT ) tablet 25 mg (25 mg Oral Given 04/08/24 2020)     IMPRESSION / MDM / ASSESSMENT AND PLAN / ED COURSE  I reviewed the triage vital signs and the nursing notes.                              Differential diagnosis includes, but is not limited to, sinusitis, migraine, oral candidiasis, electrolyte abnormality, anemia, costochondritis, viral URI  Patient's presentation is most consistent with acute complicated illness / injury requiring diagnostic workup.  Patient is a 59 year old female presenting with signs and symptoms as described above.  Reports this headache feels like one of her regular migraines.  This is not the worst headache of her life.  She has normal range of motion of her neck. Afebrile. No meningeal signs on exam.  Tongue exam notable for oral thrush. Will treat with nystatin . CBC unremarkable.  CMP without electrolyte abnormality.  UA unremarkable, no evidence of infection.  Respiratory panel negative.  CK unremarkable.  Troponin unremarkable. CXR with no acute  findings. EKG with no acute ischemic changes. Low suspicion ACS. CT Head with no acute intracranial abnormality, notable for mild maxillary and ethmoid sinus disease. Will treat for acute sinusitis with Flonase . Migraine cocktail given and patient would like to go home after completion of the migraine cocktail. Given medication Rx for dizziness. She can follow-up with her PCP outpatient following today's visit.  The patient may return to the emergency department for any new, worsening, or concerning symptoms. Patient was given the opportunity to ask questions; all questions were answered. Emergency department return precautions were discussed with the patient.  Patient is in agreement to the treatment plan.  Patient is stable for discharge.   FINAL CLINICAL IMPRESSION(S) / ED DIAGNOSES   Final diagnoses:  Dizziness  Migraine without aura and with status migrainosus, not intractable  Oropharyngeal candidiasis     Rx /  DC Orders   ED Discharge Orders          Ordered    meclizine  (ANTIVERT ) 25 MG tablet  3 times daily PRN        04/08/24 2010    fluticasone  (FLONASE ) 50 MCG/ACT nasal spray  Daily        04/08/24 2010    nystatin  (MYCOSTATIN ) 100000 UNIT/ML suspension  4 times daily        04/08/24 2012             Note:  This document was prepared using Dragon voice recognition software and may include unintentional dictation errors.     Sheron Rockwell, PA-C 04/08/24 2053  "

## 2024-04-08 NOTE — ED Triage Notes (Signed)
 C/O cough, body aches, fevers, weakness x 2 days.  Ibuprofen  last taken last night.  AAOx3. Skin warm and dry. NAD
